# Patient Record
Sex: Female | Born: 1980
Health system: Southern US, Community
[De-identification: ages and names within clinical notes are randomized; demographics above are authoritative.]

## PROBLEM LIST (undated history)

## (undated) DIAGNOSIS — A6923 Arthritis due to Lyme disease: Secondary | ICD-10-CM

## (undated) DIAGNOSIS — I493 Ventricular premature depolarization: Secondary | ICD-10-CM

## (undated) DIAGNOSIS — M419 Scoliosis, unspecified: Secondary | ICD-10-CM

## (undated) DIAGNOSIS — M797 Fibromyalgia: Secondary | ICD-10-CM

## (undated) DIAGNOSIS — C801 Malignant (primary) neoplasm, unspecified: Secondary | ICD-10-CM

## (undated) DIAGNOSIS — M549 Dorsalgia, unspecified: Secondary | ICD-10-CM

## (undated) DIAGNOSIS — D649 Anemia, unspecified: Secondary | ICD-10-CM

## (undated) DIAGNOSIS — G510 Bell's palsy: Secondary | ICD-10-CM

## (undated) DIAGNOSIS — R569 Unspecified convulsions: Secondary | ICD-10-CM

## (undated) DIAGNOSIS — M479 Spondylosis, unspecified: Secondary | ICD-10-CM

## (undated) DIAGNOSIS — G43109 Migraine with aura, not intractable, without status migrainosus: Secondary | ICD-10-CM

## (undated) DIAGNOSIS — F419 Anxiety disorder, unspecified: Secondary | ICD-10-CM

## (undated) DIAGNOSIS — M609 Myositis, unspecified: Secondary | ICD-10-CM

## (undated) DIAGNOSIS — R002 Palpitations: Secondary | ICD-10-CM

## (undated) DIAGNOSIS — IMO0002 Reserved for concepts with insufficient information to code with codable children: Secondary | ICD-10-CM

## (undated) DIAGNOSIS — I10 Essential (primary) hypertension: Secondary | ICD-10-CM

## (undated) DIAGNOSIS — Z309 Encounter for contraceptive management, unspecified: Secondary | ICD-10-CM

## (undated) DIAGNOSIS — Z87442 Personal history of urinary calculi: Secondary | ICD-10-CM

## (undated) DIAGNOSIS — F17200 Nicotine dependence, unspecified, uncomplicated: Secondary | ICD-10-CM

## (undated) HISTORY — DX: Anemia, unspecified: D64.9

## (undated) HISTORY — DX: Anxiety disorder, unspecified: F41.9

## (undated) HISTORY — DX: Scoliosis, unspecified: M41.9

## (undated) HISTORY — DX: Migraine with aura, not intractable, without status migrainosus: G43.109

## (undated) HISTORY — DX: Nicotine dependence, unspecified, uncomplicated: F17.200

## (undated) HISTORY — DX: Encounter for contraceptive management, unspecified: Z30.9

## (undated) HISTORY — DX: Malignant (primary) neoplasm, unspecified: C80.1

---
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
## 2001-02-23 ENCOUNTER — Other Ambulatory Visit: Admission: RE | Admit: 2001-02-23 | Discharge: 2001-02-23 | Payer: Self-pay | Admitting: Obstetrics and Gynecology

## 2001-04-09 ENCOUNTER — Ambulatory Visit (HOSPITAL_COMMUNITY): Admission: EM | Admit: 2001-04-09 | Discharge: 2001-04-09 | Payer: Self-pay | Admitting: Obstetrics and Gynecology

## 2001-08-05 ENCOUNTER — Inpatient Hospital Stay (HOSPITAL_COMMUNITY): Admission: AD | Admit: 2001-08-05 | Discharge: 2001-08-06 | Payer: Self-pay | Admitting: Internal Medicine

## 2001-08-10 ENCOUNTER — Ambulatory Visit (HOSPITAL_COMMUNITY): Admission: AD | Admit: 2001-08-10 | Discharge: 2001-08-10 | Payer: Self-pay | Admitting: Internal Medicine

## 2001-08-20 ENCOUNTER — Ambulatory Visit (HOSPITAL_COMMUNITY): Admission: RE | Admit: 2001-08-20 | Discharge: 2001-08-20 | Payer: Self-pay | Admitting: Obstetrics and Gynecology

## 2001-08-30 ENCOUNTER — Observation Stay (HOSPITAL_COMMUNITY): Admission: AD | Admit: 2001-08-30 | Discharge: 2001-08-30 | Payer: Self-pay | Admitting: Obstetrics and Gynecology

## 2001-09-17 ENCOUNTER — Inpatient Hospital Stay (HOSPITAL_COMMUNITY): Admission: AD | Admit: 2001-09-17 | Discharge: 2001-09-19 | Payer: Self-pay | Admitting: Obstetrics and Gynecology

## 2001-11-29 ENCOUNTER — Emergency Department (HOSPITAL_COMMUNITY): Admission: EM | Admit: 2001-11-29 | Discharge: 2001-11-29 | Payer: Self-pay | Admitting: *Deleted

## 2003-12-14 ENCOUNTER — Ambulatory Visit (HOSPITAL_COMMUNITY): Admission: RE | Admit: 2003-12-14 | Discharge: 2003-12-14 | Payer: Self-pay | Admitting: Obstetrics and Gynecology

## 2004-05-28 ENCOUNTER — Ambulatory Visit (HOSPITAL_COMMUNITY): Admission: RE | Admit: 2004-05-28 | Discharge: 2004-05-28 | Payer: Self-pay | Admitting: Urology

## 2004-06-07 ENCOUNTER — Ambulatory Visit (HOSPITAL_COMMUNITY): Admission: RE | Admit: 2004-06-07 | Discharge: 2004-06-07 | Payer: Self-pay | Admitting: Obstetrics and Gynecology

## 2004-08-15 ENCOUNTER — Emergency Department (HOSPITAL_COMMUNITY): Admission: EM | Admit: 2004-08-15 | Discharge: 2004-08-16 | Payer: Self-pay | Admitting: *Deleted

## 2004-09-10 ENCOUNTER — Ambulatory Visit (HOSPITAL_COMMUNITY): Admission: RE | Admit: 2004-09-10 | Discharge: 2004-09-10 | Payer: Self-pay | Admitting: Obstetrics & Gynecology

## 2004-09-23 ENCOUNTER — Emergency Department (HOSPITAL_COMMUNITY): Admission: EM | Admit: 2004-09-23 | Discharge: 2004-09-23 | Payer: Self-pay | Admitting: Emergency Medicine

## 2004-10-04 ENCOUNTER — Encounter (HOSPITAL_COMMUNITY): Admission: RE | Admit: 2004-10-04 | Discharge: 2004-11-03 | Payer: Self-pay | Admitting: Orthopaedic Surgery

## 2004-11-18 HISTORY — PX: OOPHORECTOMY: SHX86

## 2004-12-03 ENCOUNTER — Ambulatory Visit (HOSPITAL_COMMUNITY): Admission: RE | Admit: 2004-12-03 | Discharge: 2004-12-03 | Payer: Self-pay | Admitting: Obstetrics and Gynecology

## 2005-01-05 ENCOUNTER — Emergency Department (HOSPITAL_COMMUNITY): Admission: EM | Admit: 2005-01-05 | Discharge: 2005-01-05 | Payer: Self-pay | Admitting: Emergency Medicine

## 2005-01-07 ENCOUNTER — Ambulatory Visit (HOSPITAL_COMMUNITY): Admission: RE | Admit: 2005-01-07 | Discharge: 2005-01-07 | Payer: Self-pay | Admitting: Family Medicine

## 2005-01-07 ENCOUNTER — Ambulatory Visit: Payer: Self-pay | Admitting: *Deleted

## 2005-01-14 ENCOUNTER — Ambulatory Visit (HOSPITAL_COMMUNITY): Admission: RE | Admit: 2005-01-14 | Discharge: 2005-01-14 | Payer: Self-pay | Admitting: Family Medicine

## 2005-01-24 ENCOUNTER — Ambulatory Visit (HOSPITAL_COMMUNITY): Admission: RE | Admit: 2005-01-24 | Discharge: 2005-01-24 | Payer: Self-pay | Admitting: Advanced Practice Midwife

## 2005-03-11 ENCOUNTER — Encounter (HOSPITAL_COMMUNITY): Admission: RE | Admit: 2005-03-11 | Discharge: 2005-04-10 | Payer: Self-pay | Admitting: Oncology

## 2005-03-11 ENCOUNTER — Encounter: Admission: RE | Admit: 2005-03-11 | Discharge: 2005-03-11 | Payer: Self-pay | Admitting: Oncology

## 2005-03-11 ENCOUNTER — Ambulatory Visit (HOSPITAL_COMMUNITY): Payer: Self-pay | Admitting: Oncology

## 2005-03-21 ENCOUNTER — Ambulatory Visit (HOSPITAL_COMMUNITY): Admission: RE | Admit: 2005-03-21 | Discharge: 2005-03-21 | Payer: Self-pay | Admitting: *Deleted

## 2005-04-15 ENCOUNTER — Ambulatory Visit (HOSPITAL_COMMUNITY): Admission: RE | Admit: 2005-04-15 | Discharge: 2005-04-15 | Payer: Self-pay | Admitting: Obstetrics and Gynecology

## 2005-05-30 ENCOUNTER — Emergency Department (HOSPITAL_COMMUNITY): Admission: EM | Admit: 2005-05-30 | Discharge: 2005-05-31 | Payer: Self-pay | Admitting: *Deleted

## 2005-10-25 ENCOUNTER — Emergency Department (HOSPITAL_COMMUNITY): Admission: EM | Admit: 2005-10-25 | Discharge: 2005-10-26 | Payer: Self-pay | Admitting: *Deleted

## 2005-10-28 ENCOUNTER — Ambulatory Visit (HOSPITAL_COMMUNITY): Admission: RE | Admit: 2005-10-28 | Discharge: 2005-10-28 | Payer: Self-pay | Admitting: Internal Medicine

## 2005-11-07 ENCOUNTER — Ambulatory Visit (HOSPITAL_COMMUNITY): Admission: RE | Admit: 2005-11-07 | Discharge: 2005-11-07 | Payer: Self-pay | Admitting: *Deleted

## 2005-11-29 ENCOUNTER — Other Ambulatory Visit: Admission: RE | Admit: 2005-11-29 | Discharge: 2005-11-29 | Payer: Self-pay | Admitting: Advanced Practice Midwife

## 2005-12-12 ENCOUNTER — Emergency Department (HOSPITAL_COMMUNITY): Admission: EM | Admit: 2005-12-12 | Discharge: 2005-12-12 | Payer: Self-pay | Admitting: Emergency Medicine

## 2006-02-20 ENCOUNTER — Ambulatory Visit: Payer: Self-pay | Admitting: Internal Medicine

## 2006-03-07 ENCOUNTER — Ambulatory Visit: Payer: Self-pay | Admitting: Internal Medicine

## 2006-03-07 ENCOUNTER — Ambulatory Visit (HOSPITAL_COMMUNITY): Admission: RE | Admit: 2006-03-07 | Discharge: 2006-03-07 | Payer: Self-pay | Admitting: Internal Medicine

## 2007-12-02 ENCOUNTER — Encounter: Payer: Self-pay | Admitting: Orthopedic Surgery

## 2007-12-02 ENCOUNTER — Ambulatory Visit (HOSPITAL_COMMUNITY): Admission: RE | Admit: 2007-12-02 | Discharge: 2007-12-02 | Payer: Self-pay | Admitting: Family Medicine

## 2008-03-18 ENCOUNTER — Other Ambulatory Visit: Admission: RE | Admit: 2008-03-18 | Discharge: 2008-03-18 | Payer: Self-pay | Admitting: Obstetrics and Gynecology

## 2008-03-18 ENCOUNTER — Encounter: Admission: RE | Admit: 2008-03-18 | Discharge: 2008-03-18 | Payer: Self-pay | Admitting: Family Medicine

## 2008-03-18 ENCOUNTER — Encounter: Payer: Self-pay | Admitting: Orthopedic Surgery

## 2008-03-21 ENCOUNTER — Ambulatory Visit (HOSPITAL_COMMUNITY): Admission: RE | Admit: 2008-03-21 | Discharge: 2008-03-21 | Payer: Self-pay | Admitting: Obstetrics & Gynecology

## 2008-06-15 ENCOUNTER — Ambulatory Visit (HOSPITAL_COMMUNITY): Admission: RE | Admit: 2008-06-15 | Discharge: 2008-06-15 | Payer: Self-pay | Admitting: Family Medicine

## 2008-07-27 ENCOUNTER — Emergency Department (HOSPITAL_COMMUNITY): Admission: EM | Admit: 2008-07-27 | Discharge: 2008-07-27 | Payer: Self-pay | Admitting: Emergency Medicine

## 2008-10-24 ENCOUNTER — Encounter: Payer: Self-pay | Admitting: Orthopedic Surgery

## 2008-10-24 ENCOUNTER — Ambulatory Visit (HOSPITAL_COMMUNITY): Admission: RE | Admit: 2008-10-24 | Discharge: 2008-10-24 | Payer: Self-pay | Admitting: Family Medicine

## 2008-10-27 ENCOUNTER — Ambulatory Visit: Payer: Self-pay | Admitting: Orthopedic Surgery

## 2008-10-27 DIAGNOSIS — M25519 Pain in unspecified shoulder: Secondary | ICD-10-CM | POA: Insufficient documentation

## 2008-10-27 DIAGNOSIS — M542 Cervicalgia: Secondary | ICD-10-CM | POA: Insufficient documentation

## 2008-10-28 ENCOUNTER — Encounter: Payer: Self-pay | Admitting: Orthopedic Surgery

## 2008-10-28 ENCOUNTER — Telehealth: Payer: Self-pay | Admitting: Orthopedic Surgery

## 2008-11-04 ENCOUNTER — Telehealth: Payer: Self-pay | Admitting: Orthopedic Surgery

## 2008-11-22 ENCOUNTER — Telehealth: Payer: Self-pay | Admitting: Orthopedic Surgery

## 2008-12-21 ENCOUNTER — Ambulatory Visit (HOSPITAL_COMMUNITY): Admission: RE | Admit: 2008-12-21 | Discharge: 2008-12-21 | Payer: Self-pay | Admitting: Family Medicine

## 2009-01-25 ENCOUNTER — Ambulatory Visit (HOSPITAL_COMMUNITY): Admission: RE | Admit: 2009-01-25 | Discharge: 2009-01-25 | Payer: Self-pay | Admitting: Neurological Surgery

## 2009-03-31 ENCOUNTER — Other Ambulatory Visit: Admission: RE | Admit: 2009-03-31 | Discharge: 2009-03-31 | Payer: Self-pay | Admitting: Obstetrics and Gynecology

## 2009-05-12 ENCOUNTER — Encounter: Admission: RE | Admit: 2009-05-12 | Discharge: 2009-05-12 | Payer: Self-pay | Admitting: Family Medicine

## 2009-11-01 ENCOUNTER — Emergency Department (HOSPITAL_COMMUNITY): Admission: EM | Admit: 2009-11-01 | Discharge: 2009-11-01 | Payer: Self-pay | Admitting: Emergency Medicine

## 2010-02-09 ENCOUNTER — Ambulatory Visit (HOSPITAL_COMMUNITY): Admission: RE | Admit: 2010-02-09 | Discharge: 2010-02-09 | Payer: Self-pay | Admitting: Family Medicine

## 2010-02-15 ENCOUNTER — Emergency Department (HOSPITAL_COMMUNITY): Admission: EM | Admit: 2010-02-15 | Discharge: 2010-02-15 | Payer: Self-pay | Admitting: Emergency Medicine

## 2010-05-20 ENCOUNTER — Ambulatory Visit: Payer: Self-pay | Admitting: Family Medicine

## 2010-05-20 DIAGNOSIS — F17200 Nicotine dependence, unspecified, uncomplicated: Secondary | ICD-10-CM | POA: Insufficient documentation

## 2010-05-20 DIAGNOSIS — J029 Acute pharyngitis, unspecified: Secondary | ICD-10-CM

## 2010-05-20 DIAGNOSIS — T169XXA Foreign body in ear, unspecified ear, initial encounter: Secondary | ICD-10-CM | POA: Insufficient documentation

## 2010-05-20 DIAGNOSIS — J209 Acute bronchitis, unspecified: Secondary | ICD-10-CM

## 2010-05-20 DIAGNOSIS — J449 Chronic obstructive pulmonary disease, unspecified: Secondary | ICD-10-CM | POA: Insufficient documentation

## 2010-05-20 LAB — CONVERTED CEMR LAB: Rapid Strep: NEGATIVE

## 2010-06-01 ENCOUNTER — Other Ambulatory Visit
Admission: RE | Admit: 2010-06-01 | Discharge: 2010-06-01 | Payer: Self-pay | Source: Home / Self Care | Admitting: Obstetrics & Gynecology

## 2010-08-09 ENCOUNTER — Ambulatory Visit (HOSPITAL_COMMUNITY): Admission: RE | Admit: 2010-08-09 | Discharge: 2010-08-09 | Payer: Self-pay | Admitting: Family Medicine

## 2010-08-10 ENCOUNTER — Ambulatory Visit: Admission: AD | Admit: 2010-08-10 | Discharge: 2010-08-10 | Payer: Self-pay | Admitting: Family Medicine

## 2010-08-12 ENCOUNTER — Emergency Department (HOSPITAL_COMMUNITY): Admission: EM | Admit: 2010-08-12 | Discharge: 2010-08-12 | Payer: Self-pay | Admitting: Emergency Medicine

## 2010-08-15 ENCOUNTER — Ambulatory Visit (HOSPITAL_COMMUNITY)
Admission: RE | Admit: 2010-08-15 | Discharge: 2010-08-15 | Payer: Self-pay | Source: Home / Self Care | Admitting: Physical Medicine and Rehabilitation

## 2010-08-22 ENCOUNTER — Emergency Department (HOSPITAL_COMMUNITY): Admission: EM | Admit: 2010-08-22 | Discharge: 2010-08-23 | Payer: Self-pay | Admitting: Emergency Medicine

## 2010-08-30 ENCOUNTER — Ambulatory Visit: Payer: Self-pay | Admitting: Otolaryngology

## 2010-12-09 ENCOUNTER — Encounter: Payer: Self-pay | Admitting: Family Medicine

## 2010-12-10 ENCOUNTER — Encounter: Payer: Self-pay | Admitting: Family Medicine

## 2010-12-20 NOTE — Letter (Signed)
Summary: Handout Printed  Printed Handout:  - Smoking Cessation

## 2010-12-20 NOTE — Assessment & Plan Note (Signed)
Summary: CHEST COLD/EVM   Vital Signs:  Patient Profile:   30 Years Old Female CC:      Uri symptoms x 2 weeks Height:     64 inches Weight:      129 pounds BMI:     22.22 BSA:     1.62 O2 Sat:      99 % O2 treatment:    Room Air Temp:     98.9 degrees F oral Pulse rate:   68 / minute Pulse rhythm:   regular Resp:     20 per minute BP sitting:   120 / 80  (left arm) Cuff size:   regular  Vitals Entered By: Providence Crosby LPN (May 20, 5620 3:58 PM)                  Current Allergies: No known allergies History of Present Illness History from: patient Reason for visit: see chief complaint Chief Complaint: cough and sputum production  x 2 weeks History of Present Illness: symptoms x 2 weeks sputum brown in color ;the patient reports that she smokes approximately one pack of cigarettes per day. She reports that she's been wheezing at night. She reports that she's not able to sleep well. She reports that she has been smoking for over 15 years. She reports that she has been trying to quit but has difficulty. She says that she needs help. She reports that she uses her inhaler more frequently in the past 2 days. She reports waking up with fever and chills. She reports coughing up thick sputum. The patient denies blood in the sputum. The patient denies chest pain. The patient denies headaches.the patient reports that she has been using Robitussin but no significant improvement. She's been using the over-the-counter cough syrup.  REVIEW OF SYSTEMS Constitutional Symptoms       Complains of night sweats and fatigue.     Denies fever, chills, weight loss, and weight gain.      Comments: Exhausted feeling Eyes       Denies change in vision, eye pain, eye discharge, glasses, contact lenses, and eye surgery. Ear/Nose/Throat/Mouth       Complains of frequent runny nose, sinus problems, sore throat, and hoarseness.      Denies hearing loss/aids, change in hearing, ear pain, ear discharge,  dizziness, frequent nose bleeds, and tooth pain or bleeding.  Respiratory       Complains of dry cough, productive cough, shortness of breath, bronchitis, and emphysema/COPD.      Denies wheezing and asthma.  Cardiovascular       Complains of murmurs.      Denies chest pain and tires easily with exhertion.      Comments: tachycardia/von-willebrants  //    Gastrointestinal       Denies stomach pain, nausea/vomiting, diarrhea, constipation, blood in bowel movements, and indigestion. Genitourniary       Denies painful urination, blood or discharge from vagina, kidney stones, and loss of urinary control. Neurological       Denies paralysis, seizures, and fainting/blackouts. Musculoskeletal       Complains of joint stiffness, redness, and swelling.      Denies muscle pain, joint pain, decreased range of motion, muscle weakness, and gout.  Skin       Denies bruising, unusual mles/lumps or sores, and hair/skin or nail changes.  Psych       Denies mood changes, temper/anger issues, anxiety/stress, speech problems, depression, and sleep problems. Other Comments: complains  of some swelling of left side of neck   Past History:  Family History: Last updated: 05/20/2010 Family History of Arthritis Father: deceased= Heart Failure dialysis Mother: alive heart murmur copd Siblings: 2 brothers alive and well  Social History: Last updated: 10/27/2008 Patient is married.  medical assistant  Risk Factors: Caffeine Use: 4 (10/27/2008)  Risk Factors: Smoking Status: current (05/20/2010) Packs/Day: 1.0 (05/20/2010)  Past Medical History: COPD Tachcardia DDD C2-C7 Von - Willebrands Disease Smoker  Past Surgical History: Cystectomy of the left ovary  Family History: Family History of Arthritis Father: deceased= Heart Failure dialysis Mother: alive heart murmur copd Siblings: 2 brothers alive and well  Social History: Packs/Day:  1.0 Physical Exam General appearance: well  developed, well nourished, no acute distress Head: normocephalic, atraumatic Eyes: conjunctivae and lids normal Pupils: equal, round, reactive to light Ears: small black foreign body in the right ear canal Nasal: marked sinus and nasal congestion Oral/Pharynx: tongue normal, posterior pharynx with mild erythema and mildly enlarged tonsils Neck: neck supple,  trachea midline, no masses Thyroid: soft, no nodules or masses palpated Chest/Lungs: inspiratory and expiratory wheezes heard in both lung bases, scattered Rales-rare Heart: normal S1, S2 sounds Abdomen: soft, non-tender without obvious organomegaly Extremities: normal extremities Neurological: grossly intact and non-focal Skin: no obvious rashes or lesions MSE: oriented to time, place, and person post nebulizer treatment: The patient was reexamined and her lungs were much more clear and no wheezing was heard.    Assessment Problems:   CHRONIC OBSTRUCTIVE PULMONARY DISEASE, ACUTE EXACERBATION (ICD-491.21) FOREIGN BODY, EAR, RIGHT (ICD-931) CHRONIC OBSTRUCTIVE PULMONARY DISEASE (ICD-496) ACUTE BRONCHITIS (ICD-466.0) SORE THROAT (ICD-462) SMOKER (ICD-305.1) COUGH (ICD-786.2) UPPER RESPIRATORY INFECTION, ACUTE (ICD-465.9) NECK PAIN (ICD-723.1) SHOULDER PAIN (ICD-719.41) FAMILY HISTORY OF ARTHRITIS (ICD-V17.7) New Problems: CHRONIC OBSTRUCTIVE PULMONARY DISEASE, ACUTE EXACERBATION (ICD-491.21) FOREIGN BODY, EAR, RIGHT (ICD-931) CHRONIC OBSTRUCTIVE PULMONARY DISEASE (ICD-496) ACUTE BRONCHITIS (ICD-466.0) SORE THROAT (ICD-462) SMOKER (ICD-305.1) COUGH (ICD-786.2) UPPER RESPIRATORY INFECTION, ACUTE (ICD-465.9)   Patient Education: Patient and/or caregiver instructed in the following: rest, fluids, quit smoking. Demonstrates willingness to comply.  Plan New Medications/Changes: GUAIFENESIN-CODEINE 100-10 MG/5ML SYRP (GUAIFENESIN-CODEINE) take 10 mL by mouth q4-6h as needed cough, take with plenty of water  #120 mL x 0,  05/20/2010, Keylen Eckenrode MD VENTOLIN HFA 108 (90 BASE) MCG/ACT AERS (ALBUTEROL SULFATE) 2 puffs q 3 hours as needed severe cough and wheezing  #1 x 0, 05/20/2010, Kalei Meda MD ZITHROMAX Z-PAK 250 MG TABS (AZITHROMYCIN) use as directed  #1 x 1, 05/20/2010, Tarina Volk MD GUAIFENESIN-CODEINE 100-10 MG/5ML SYRP (GUAIFENESIN-CODEINE) take 10 mL by mouth q4-6h as needed cough, take with plenty of water  #120 mL x 0, 05/20/2010, Wendy Hoback MD GUAIFENESIN-CODEINE 100-10 MG/5ML SYRP (GUAIFENESIN-CODEINE) take 10 mL by mouth q4-6h as needed cough, take with plenty of water  #150 mL x 0, 05/20/2010, Providence Crosby LPN  New Orders: Pulse Oximetry (single measurment) [94760] Rapid Strep-[facility] [87430] Nebulizer Tx [94640] Clear Outer Ear canal [69200] New Patient Level III [54098] Tobacco use cessation intensive >10 minutes [99407] (J1914) Tobacco (smoke) use Cessation Intervention-Counseling [G8402] Follow Up: Follow up in 1-2 days if no improvement, Follow up with Primary Physician  The patient and/or caregiver has been counseled thoroughly with regard to medications prescribed including dosage, schedule, interactions, rationale for use, and possible side effects and they verbalize understanding.  Diagnoses and expected course of recovery discussed and will return if not improved as expected or if the condition worsens. Patient and/or caregiver verbalized understanding.  Prescriptions: GUAIFENESIN-CODEINE 100-10 MG/5ML  SYRP (GUAIFENESIN-CODEINE) take 10 mL by mouth q4-6h as needed cough, take with plenty of water  #120 mL x 0   Entered and Authorized by:   Standley Dakins MD   Signed by:   Standley Dakins MD on 05/20/2010   Method used:   Print then Give to Patient   RxID:   [id number] VENTOLIN HFA 108 (90 BASE) MCG/ACT AERS (ALBUTEROL SULFATE) 2 puffs q 3 hours as needed severe cough and wheezing  #1 x 0   Entered and Authorized by:   Standley Dakins MD   Signed by:    Standley Dakins MD on 05/20/2010   Method used:   Electronically to        [facility] [address] (retail)       [address], [address]       [place], [place]  [address]       Ph: [phone]       Fax: [phone]   RxID:   [id number] ZITHROMAX Z-PAK 250 MG TABS (AZITHROMYCIN) use as directed  #1 x 1   Entered and Authorized by:   Standley Dakins MD   Signed by:   Standley Dakins MD on 05/20/2010   Method used:   Electronically to        [facility] [address] (retail)       [address], [address]       [place], [place]  [address]       Ph: [phone]       Fax: [phone]   RxID:   [id number] GUAIFENESIN-CODEINE 100-10 MG/5ML SYRP (GUAIFENESIN-CODEINE) take 10 mL by mouth q4-6h as needed cough, take with plenty of water  #120 mL x 0   Entered and Authorized by:   Standley Dakins MD   Signed by:   Standley Dakins MD on 05/20/2010   Method used:   Print then Give to Patient   RxID:   [id number] GUAIFENESIN-CODEINE 100-10 MG/5ML SYRP (GUAIFENESIN-CODEINE) take 10 mL by mouth q4-6h as needed cough, take with plenty of water  #150 mL x 0   Entered and Authorized by:   Standley Dakins MD   Signed by:   Providence Crosby LPN on 37/08/6268   Method used:   Print then Give to Patient   RxID:   [id number]  Only one prescription for the cough syrup was given. We had difficulty with printing that prescription on the appropriate paper required. The patient actually only received one prescription for the cough syrup.  DuoNeb was the medication use for the nebulizer treatment. The patient tolerated very well.  Patient Instructions: 1)  Please schedule an appointment with your primary doctor in : 2-3 days 2)  Please take antibiotics until completed 3)  Please seek care if not getting better or getting worse. 4)  There is no on-call provider or services available at this clinic during off-hours (when the  clinic is closed).  If you developed a problem or concern that required immediate attention, the patient was advised to go the the nearest available urgent care or emergency department for medical care.   5)  Tobacco is very bad for your health and your loved ones! You Should stop smoking!. 6)  Stop Smoking Tips: Choose a Quit date. Cut down before the Quit date. decide what you will do as a substitute when you feel the urge to smoke(gum,toothpick,exercise). 7)  Eat yogurt to prevent yeast infections.   Orders Added: 1)  Pulse Oximetry (single measurment) [94760] 2)  Rapid Strep-[facility] [87430] 3)  Nebulizer Tx [04540] 4)  Clear Outer Ear canal [69200] 5)  New Patient Level III [98119] 6)  Tobacco use cessation intensive >10 minutes [99407] 7)  (J4782) Tobacco (smoke) use Cessation Intervention-Counseling [G8402]  Laboratory Results  Date/Time Received: May 20, 2010 4:12 PM Date/Time Reported: May 20, 2010 4:12 PM  Other Tests  Rapid Strep: negative   Preventive Screening-Counseling & Management  Alcohol-Tobacco     Smoking Status: current     Smoking Cessation Counseling: yes     Smoke Cessation Stage: ready     Packs/Day: 1.0     Year Started: 1995     Tobacco Counseling: to quit use of tobacco products     Passive Smoke Counseling: to avoid passive smoke exposure  Comments: I spent more than 10 minutes reviewing smoking cessation counseling techniques and encouraged the patient to quit smoking.  The risks, benefits and possible side effects were clearly explained and discussed with the patient.  The patient verbalized clear understanding.  The patient was given instructions to return if symptoms don't improve, worsen or new changes develop.  If it is not during clinic hours and the patient cannot get back to this clinic then the patient was told to seek medical care at an available urgent care or emergency department.  The patient verbalized understanding.     The patient was  informed that there is no on-call provider or services available at this clinic during off-hours (when the clinic is closed).  If the patient developed a problem or concern that required immediate attention, the patient was advised to go the the nearest available urgent care or emergency department for medical care.  The patient verbalized understanding.     It was clearly explained to the patient that this [facility] is not intended to be a primary care clinic.  The patient is always better served by the continuity of care and the provider/patient relationships developed with their dedicated primary care provider.  The patient was told to be sure to follow up as soon as possible with their primary care provider to discuss treatments received and to receive further examination and testing.  The patient verbalized understanding. The will f/u with PCP ASAP.   The patient's prescriptions were checked for possible interactions and electronically sent to the pharmacy of choice.    Rodney Langton, M.D., F.A.A.F.P.  May 20, 2010

## 2011-01-30 LAB — CBC
MCH: 31.6 pg (ref 26.0–34.0)
MCHC: 35.3 g/dL (ref 30.0–36.0)
MCV: 89.4 fL (ref 78.0–100.0)
Platelets: 358 10*3/uL (ref 150–400)

## 2011-01-30 LAB — DIFFERENTIAL
Basophils Absolute: 0.3 10*3/uL — ABNORMAL HIGH (ref 0.0–0.1)
Eosinophils Relative: 2 % (ref 0–5)
Lymphocytes Relative: 26 % (ref 12–46)
Lymphs Abs: 4.3 10*3/uL — ABNORMAL HIGH (ref 0.7–4.0)
Monocytes Absolute: 0.9 10*3/uL (ref 0.1–1.0)
Neutro Abs: 10.8 10*3/uL — ABNORMAL HIGH (ref 1.7–7.7)

## 2011-01-30 LAB — URINALYSIS, ROUTINE W REFLEX MICROSCOPIC
Bilirubin Urine: NEGATIVE
Ketones, ur: NEGATIVE mg/dL
Nitrite: NEGATIVE
Protein, ur: NEGATIVE mg/dL
Specific Gravity, Urine: 1.025 (ref 1.005–1.030)
Urobilinogen, UA: 0.2 mg/dL (ref 0.0–1.0)

## 2011-01-30 LAB — COMPREHENSIVE METABOLIC PANEL
AST: 13 U/L (ref 0–37)
Albumin: 3.4 g/dL — ABNORMAL LOW (ref 3.5–5.2)
BUN: 12 mg/dL (ref 6–23)
Calcium: 8.9 mg/dL (ref 8.4–10.5)
Creatinine, Ser: 0.72 mg/dL (ref 0.4–1.2)
GFR calc Af Amer: >60 mL/min (ref >60)
GFR calc non Af Amer: >60 mL/min (ref >60)

## 2011-01-30 LAB — PREGNANCY, URINE: Preg Test, Ur: NEGATIVE

## 2011-04-03 ENCOUNTER — Other Ambulatory Visit (HOSPITAL_COMMUNITY): Payer: Self-pay | Admitting: Family Medicine

## 2011-04-03 DIAGNOSIS — M546 Pain in thoracic spine: Secondary | ICD-10-CM

## 2011-04-04 ENCOUNTER — Ambulatory Visit (HOSPITAL_COMMUNITY)
Admission: RE | Admit: 2011-04-04 | Discharge: 2011-04-04 | Disposition: A | Discharge status: Home / Self Care | Payer: PRIVATE HEALTH INSURANCE | Source: Ambulatory Visit | Attending: Family Medicine | Admitting: Family Medicine

## 2011-04-04 DIAGNOSIS — M5124 Other intervertebral disc displacement, thoracic region: Secondary | ICD-10-CM | POA: Insufficient documentation

## 2011-04-04 DIAGNOSIS — R209 Unspecified disturbances of skin sensation: Secondary | ICD-10-CM | POA: Insufficient documentation

## 2011-04-04 DIAGNOSIS — M546 Pain in thoracic spine: Secondary | ICD-10-CM | POA: Insufficient documentation

## 2011-04-05 NOTE — Op Note (Signed)
NAME:  Theresa Washington, Theresa Washington                     ACCOUNT NO.:  [id number]   MEDICAL RECORD NO.:  [id number]                   PATIENT TYPE:  AMB   LOCATION:  DAY                                  FACILITY:  [facility]   PHYSICIAN:  Tilda Burrow, M.D.              DATE OF BIRTH:  [birth year]   DATE OF PROCEDURE:  DATE OF DISCHARGE:                                 OPERATIVE REPORT   PREOPERATIVE DIAGNOSIS:  Symptomatic left ovarian cyst.   POSTOPERATIVE DIAGNOSIS:  Symptomatic left ovarian cyst.   OPERATION/PROCEDURE:  1. Laparoscopic aspiration of left ovarian cyst.  2. Left partial cystectomy.   SURGEON:  Tilda Burrow, M.D.   ASSISTANTGeralynn Ochs, C.S.T.-F.A.   ANESTHESIA:  General.   COMPLICATIONS:  None.   FINDINGS:  Normal mobile uterus, normal-appearing tubes and ovaries  bilaterally.  Large cyst on the distal portion of the left ovary, with  smooth surfaces.  No evidence of recent ovulation stigmata.   INDICATIONS:  A 30 year old female being followed for pain cyst for the last  two months.  See HPI for further details.   HOSPITAL COURSE:  The patient was taken to the operating room, prepped and  draped for combined abdominal and vaginal procedure with single-tooth  tenaculum used to grasp the cervix.  The IUD was not manipulated.  We first  started with an infraumbilical vertical incision in the umbilicus and made a  transverse suprapubic 1 cm incision.  A Veress needle was introduced and  water droplet technique suggests the intraperitoneal location.  We started  insufflating when the pressures were 12 mm so we felt that was too high and  pushed forward just slightly and revealed that we had done some  preperitoneal insufflation.  Insufflation then continued at 9-10 mm pressure  without difficulty.  The umbilical site was entered with a 5 mm trocar using  laparoscopic direct guidance and then the suprapubic site treated similarly  with direct visualization of the  insertion tip.  The right lower quadrant  was visualized from the abdomen and entered without difficulty.   Attention was directed to the left adnexa where the cyst could be elevated  out of the cul-de-sac.  There was no stigmata of ovulation and no adhesions.  We made puncture cautery opening of the cyst and drained the cyst fluid.  We  then grasped the edge of the puncture site, and opened the tip of the ovary  a distance of approximately 1.5 cm and began to peel out the ovarian cyst.  Unfortunately, the degree of oozing was enough to cause concern and in view  of her possible von Willebrand history, we reassessed and decided to simply  do partial cystectomy and leave the remainder of the ovary and cyst.  There  was no evidence of endometriosis in the cul-de-sac.  Irrigation of the  pelvis was performed and hemostasis confirmed.  The procedure I considered  complete with removal  of probably a small portion of the cyst wall for  histology.  The patient went to the recovery room in good condition.  Sponge  and needle counts were correct.   Photos were taken and will be included in the patient's outpatient record.      ___________________________________________                                            Tilda Burrow, M.D.   JVF/MEDQ  D:  06/07/2004  T:  06/07/2004  Job:  [id number]   cc:   [facility]

## 2011-04-05 NOTE — Op Note (Signed)
[facility]  Patient:    MARLITA, KEIL Visit Number: [id number] MRN: [id number]          Service Type: MED Location: 4A A428 01 Attending Physician:  Tilda Burrow Dictated by:   Estelle June, C.N.M. Admit Date:  09/17/2001 Discharge Date: 09/19/2001   CC:         [facility]  J. Joneen Caraway, M.D.   Operative Report  DELIVERY NOTE:  The patient had a strong urge to push with a reducible anterior lip of the cervix at approximately 0920.  After a brief second stage, she delivered a viable female infant at 7.  The mouth and nose were suctioned on the perineum and the rest of the baby was delivered without difficulty. Positive bonding noted.  Weight of the baby is 6 pounds 8 ounces, Apgars 9/9. The placenta separated spontaneously and delivered by a controlled cord traction and maternal pushing effort at 0946.  Twenty units of Pitocin diluted in 1000 cc of lactated Ringers were then infused rapidly IV.  The fundus was immediately firm and minimal blood loss was noted.  The vagina was then inspected and no lacerations were found.  Estimated blood loss:  350 cc. Dictated by:   Estelle June, C.N.M. Attending Physician:  Tilda Burrow DD:  09/18/01 TD:  09/20/01 Job: [id number]

## 2011-04-05 NOTE — Op Note (Signed)
Theresa Washington, Theresa Washington           ACCOUNT NO.:  [id number]   MEDICAL RECORD NO.:  [id number]          PATIENT TYPE:  OIB   LOCATION:  2853                         FACILITY:  [facility]   PHYSICIAN:  Doylene Canning. Ladona Ridgel, M.D.  DATE OF BIRTH:  May 18, 1981   DATE OF PROCEDURE:  03/07/2006  DATE OF DISCHARGE:  03/07/2006                                 OPERATIVE REPORT   PROCEDURE PERFORMED:  Head-up tilt table testing utilizing isoproterenol.   ATTENDING:  Doylene Canning. Ladona Ridgel, M.D.   I. 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  The patient  has had recurrent episodes of syncope of unclear etiology.  She is now  referred for head-up tilt table testing.   II. PROCEDURE:  After informed consent was obtained, the patient was taken  to the diagnostic EP lab in the fasting state.  After the usual preparation  and draping, she was placed in the supine position.  Her initial blood  pressure was 130/70 and her pulse was 66.  After approximately 5 minutes,  she was placed in a 70-degree head-up tilt table position.  Her blood  pressure went to the 130 range and stayed there.  Her heart rate increased  from the 70s to the 80 range.  She was maintained in this position for 30  minutes.  During this time, her blood pressure remained in the 130 range and  her heart rate got up as high as 95 beats per minute.  There was no  bradycardia.  She was asymptomatic.  She was placed back in the supine  position.  Isoproterenol was started at 1 mcg/min and her heart rate  increased to 110 beats per minute.  She initially felt somewhat dizzy.  She  was placed back in a 70-degree head-up tilt table position and during this  time she was observed and her blood pressure remained in the 120-130 range.  The blood pressure did not change significantly.   After 10 minutes in this  position, she was placed back in a supine position and returned to her room  in satisfactory condition.   COMPLICATIONS:  There were no immediate procedure complications.   RESULTS:  This demonstrates a negative head-up tilt table test for inducible  syncope.  The patient tolerated the procedure well.  She will be observed  expectantly.           ______________________________  Doylene Canning. Ladona Ridgel, M.D.     GWT/MEDQ  D:  03/07/2006  T:  03/10/2006  Job:  [id number]   cc:   Dani Gobble, MD  Fax: [phone]   Corrie Mckusick, M.D.  Fax: [phone]

## 2011-04-05 NOTE — Procedures (Signed)
Theresa, Washington           ACCOUNT NO.:  [id number]   MEDICAL RECORD NO.:  [id number]          PATIENT TYPE:  OUT   LOCATION:  RAD                           FACILITY:  [facility]   PHYSICIAN:  Vida Roller, M.D.   DATE OF BIRTH:  1981-10-23   DATE OF PROCEDURE:  01/07/2005  DATE OF DISCHARGE:                                  ECHOCARDIOGRAM   PRIMARY CARE PHYSICIAN:  Corrie Mckusick, M.D.   TAPE NUMBER:  [id number].   TAPE COUNT:  1858 through 2426.   HISTORY:  A 30 year old woman with a murmur.  The technical quality of this  study is adequate.   M-MODE TRACINGS:  The aorta is 23 mm.   The left atrium is 27 mm.   The septum is 8 mm.   The posterior wall is 8 mm.   The left ventricular diastolic dimension is 38 mm.   The left ventricular systolic dimension is 29 mm.   2-D AND DOPPLER IMAGING:  The left ventricle is normal size with normal  systolic function.  There are no wall motion abnormalities seen.  The  estimated ejection fraction is 55 to 60%.   The right ventricle is normal size with normal systolic function.  There is  a prominent moderator band.   The atria are both normal size.  The atrial septum is intact to color.   The aortic valve is morphologically unremarkable with no stenosis or  regurgitation.   The mitral valve is morphologically unremarkable with no stenosis or  regurgitation.   The tricuspid valve is morphologically unremarkable with trace  insufficiency, no stenosis is seen.   The pulmonic valve is morphologically unremarkable with no stenosis or  regurgitation.   There is no pericardial effusion.   The inferior vena cava is normal size.   The ascending aorta is normal to the limits of the study.      JH/MEDQ  D:  01/07/2005  T:  01/07/2005  Job:  [id number]

## 2011-04-05 NOTE — H&P (Signed)
[facility]  Patient:    ANNISTYN, DEPASS Visit Number: [id number] MRN: [id number]          Service Type: MED Location: 4A A428 01 Attending Physician:  Tilda Burrow Dictated by:   Pat Kocher, CNM Admit Date:  09/17/2001 Discharge Date: 09/19/2001   CC:         [facility]   History and Physical  CHIEF COMPLAINT:  Induction of labor at [redacted] weeks gestation.  HISTORY OF PRESENT ILLNESS:  [place]ah is a 30 year old, gravida 2, para 1 with an EDC of September 24, 2001 based on a sure last menstrual period and first and second trimester ultrasound. Her pregnancy course has been complicated by some early preterm contractions without cervical change. She did receive a course of betamethasone prophylactically. Blood pressures have been 110 to 130s over 60-70s. Weight gain has been approximately 20 pounds with appropriate fundal height growth.  PRENATAL LABS:  Blood type O+, rubella immune. HBsAg, HIV, GC, chlamydia are negative. MSAFP is normal. One hour GTT was normal at 116. GBS is negative.  PHYSICAL EXAMINATION:  HEENT:  Within normal limits.  HEART:  Regular rate and rhythm without murmurs.  LUNGS:  Clear to auscultation bilaterally.  ABDOMEN:  Soft and nontender.  PELVIC:  Cervix is 2, 50%, -1 vertex presentation,  EXTREMITIES:  Legs are negative.  IMPRESSION:  IUP at 30 weeks with elective induction of labor due to cervical favorability.  PLAN:  Foley preinduction with AROM and Pitocin in the morning. Dictated by:   Pat Kocher, CNM Attending Physician:  Tilda Burrow DD:  09/16/01 TD:  09/16/01 Job: [id number]

## 2011-04-05 NOTE — H&P (Signed)
NAME:  Theresa Washington, Theresa Washington                     ACCOUNT NO.:  [id number]   MEDICAL RECORD NO.:  [id number]                   PATIENT TYPE:  AMB   LOCATION:  DAY                                  FACILITY:  [facility]   PHYSICIAN:  Tilda Burrow, M.D.              DATE OF BIRTH:  04-30-1981   DATE OF ADMISSION:  DATE OF DISCHARGE:                                HISTORY & PHYSICAL   ADMITTING DIAGNOSES:  Symptomatic recurrent left ovarian cyst.   HISTORY OF PRESENT ILLNESS:  This 30 year old female, gravida 2, para 2,  using progesterone containing IUD for two years for contraception, thus  amenorrheic is admitted at this time for recurrent left ovarian cyst which  had been incapacitating, preventing the patient ability to continue work  obligations and normal personal activities.  She has been followed through  our office with a persistently painful left ovarian cyst.  She was seen in  early June by Dr. Despina Hidden where vaginal probe ultrasound revealed a 3.7 x4.2  x4.3 cm simple left ovarian cyst without free fluid, no septations, with a  completely normal right ovary.  She was placed on Ovcon-35 for suppression  and followed up in our office.  Interestingly, the pain improved initially,  and she was seen back for recurrent left flank pain enough that she was seen  in the emergency room on May 28, 2004, where a CT of the abdomen was  performed to rule out kidney stone.  There was no evidence of renal or  ureteral colic or calculi.  The IUD was in place.  There was a persistent  left ovarian cyst which was reassessed by ultrasound which showed that the  left adnexa had improved with there now being two cysts.  The maximum  diameter of the larger being 2.3 x2.0 cm in diameter.  It was felt that she  was probably collapsing one cyst and developing another.  She did have  generous cul-de-sac fluid suggesting that ovulation rupture may have  occurred.  This improved transiently, but then worsened.   Repeat ultrasound  June 01, 2004, showed that indeed the collapsing cyst still exists but  adjacent to it was an enlarging simple cyst, 3.9 x 3.0 cm in diameter with a  single septation between the two cysts.  This has been sufficiently painful  that it has been difficult for the patient to walk or have normal day to day  work activities.  After some discussion of her options and continued pain  requiring continuous analgesic use, we have decided that aspiration and  drainage of the cyst or cystectomy is required.  The patient is prepared for  the possibility of cystectomy or more extensive surgery up to and including  salpingectomy, oophorectomy or both if deemed clinically necessary at the  time of surgery.   PAST MEDICAL HISTORY:  Benign except for an incomplete notation suggesting  von Willebrand's disease with no recent coagulation studies.  Two  pregnancies have been uncomplicated, but until use of the IUD, the patient  had chronic, persistent menstrual irregularities and bleeding abnormalities  with virtually every contraceptive method.   SURGICAL HISTORY:  1. Grommets placed in ears in 1985.  2. D&C 1999 without difficulties.  3. Two uncomplicated vaginal deliveries with normal blood loss at delivery.     One episode of dysfunctional bleeding after the second pregnancy not     requiring any clinical intervention.  Amenorrhea on current contraceptive     method.   ALLERGIES:  None known.   PHYSICAL EXAMINATION:  VITAL SIGNS:  Height 5 feet, 2 inches.  Weight 110.  Blood pressure 110/62.  GENERAL:  Exam shows a healthy-appearing Caucasian female, alert and  oriented x3.  Pupils equal, round and reactive.  Extraocular movements  intact.  NECK: Supple.  Trachea midline.  CHEST:  Clear to auscultation.  ABDOMEN:  Slim with naval ring in place.  No masses. Tenderness in left  lower quadrant with vaginal probe ultrasound showing an enlarging dominant  cyst, 3.4 x3.1 cm in  diameter adjacent to a collapsing cyst approximately  2.5 cm in maximum diameter.  Possibility of hydrosalpinx has been  entertained, but is considered less likely.   IMPRESSION:  Recurrent symptomatic cyst, left ovary.   PLAN:  Laparoscopic evaluation of adnexa with preferred therapy consisting  of ovarian cystectomy or simple aspiration of cyst.  We will consider  oophorectomy or salpingo-oophorectomy as clinically indicated based on the  findings at the time of surgery.   ADDENDUM:  We will obtain coagulation studies on the day of surgery.     ___________________________________________                                         Tilda Burrow, M.D.   JVF/MEDQ  D:  06/07/2004  T:  06/07/2004  Job:  [id number]

## 2011-04-24 ENCOUNTER — Other Ambulatory Visit (HOSPITAL_COMMUNITY): Payer: Self-pay | Admitting: Internal Medicine

## 2011-04-24 DIAGNOSIS — M545 Low back pain: Secondary | ICD-10-CM

## 2011-04-26 ENCOUNTER — Ambulatory Visit (HOSPITAL_COMMUNITY): Payer: PRIVATE HEALTH INSURANCE

## 2011-04-29 ENCOUNTER — Ambulatory Visit (HOSPITAL_COMMUNITY)
Admission: RE | Admit: 2011-04-29 | Discharge: 2011-04-29 | Disposition: A | Discharge status: Home / Self Care | Payer: PRIVATE HEALTH INSURANCE | Source: Ambulatory Visit | Attending: Internal Medicine | Admitting: Internal Medicine

## 2011-04-29 DIAGNOSIS — M545 Low back pain, unspecified: Secondary | ICD-10-CM | POA: Insufficient documentation

## 2011-04-29 DIAGNOSIS — M5126 Other intervertebral disc displacement, lumbar region: Secondary | ICD-10-CM | POA: Insufficient documentation

## 2011-06-07 ENCOUNTER — Other Ambulatory Visit: Payer: Self-pay | Admitting: Obstetrics & Gynecology

## 2011-06-07 ENCOUNTER — Other Ambulatory Visit (HOSPITAL_COMMUNITY)
Admission: RE | Admit: 2011-06-07 | Discharge: 2011-06-07 | Disposition: A | Discharge status: Home / Self Care | Payer: PRIVATE HEALTH INSURANCE | Source: Ambulatory Visit | Attending: Obstetrics & Gynecology | Admitting: Obstetrics & Gynecology

## 2011-06-07 DIAGNOSIS — Z01419 Encounter for gynecological examination (general) (routine) without abnormal findings: Secondary | ICD-10-CM | POA: Insufficient documentation

## 2011-09-11 ENCOUNTER — Emergency Department (HOSPITAL_COMMUNITY)
Admission: EM | Admit: 2011-09-11 | Discharge: 2011-09-11 | Disposition: A | Discharge status: Home / Self Care | Payer: PRIVATE HEALTH INSURANCE | Attending: Emergency Medicine | Admitting: Emergency Medicine

## 2011-09-11 ENCOUNTER — Emergency Department (HOSPITAL_COMMUNITY): Payer: PRIVATE HEALTH INSURANCE

## 2011-09-11 ENCOUNTER — Encounter: Payer: Self-pay | Admitting: *Deleted

## 2011-09-11 ENCOUNTER — Other Ambulatory Visit (HOSPITAL_COMMUNITY): Payer: Self-pay | Admitting: Family Medicine

## 2011-09-11 DIAGNOSIS — F17200 Nicotine dependence, unspecified, uncomplicated: Secondary | ICD-10-CM | POA: Insufficient documentation

## 2011-09-11 DIAGNOSIS — M542 Cervicalgia: Secondary | ICD-10-CM

## 2011-09-11 DIAGNOSIS — R569 Unspecified convulsions: Secondary | ICD-10-CM | POA: Insufficient documentation

## 2011-09-11 DIAGNOSIS — Z87442 Personal history of urinary calculi: Secondary | ICD-10-CM | POA: Insufficient documentation

## 2011-09-11 DIAGNOSIS — R202 Paresthesia of skin: Secondary | ICD-10-CM

## 2011-09-11 DIAGNOSIS — D6800 Von Willebrand disease, unspecified: Secondary | ICD-10-CM | POA: Insufficient documentation

## 2011-09-11 DIAGNOSIS — M549 Dorsalgia, unspecified: Secondary | ICD-10-CM

## 2011-09-11 DIAGNOSIS — R209 Unspecified disturbances of skin sensation: Secondary | ICD-10-CM | POA: Insufficient documentation

## 2011-09-11 DIAGNOSIS — R200 Anesthesia of skin: Secondary | ICD-10-CM

## 2011-09-11 DIAGNOSIS — IMO0002 Reserved for concepts with insufficient information to code with codable children: Secondary | ICD-10-CM | POA: Insufficient documentation

## 2011-09-11 DIAGNOSIS — M546 Pain in thoracic spine: Secondary | ICD-10-CM | POA: Insufficient documentation

## 2011-09-11 HISTORY — DX: Dorsalgia, unspecified: M54.9

## 2011-09-11 HISTORY — DX: Reserved for concepts with insufficient information to code with codable children: IMO0002

## 2011-09-11 HISTORY — DX: Unspecified convulsions: R56.9

## 2011-09-11 HISTORY — DX: Bell's palsy: G51.0

## 2011-09-11 MED ORDER — DIAZEPAM 5 MG PO TABS
10.0000 mg | ORAL_TABLET | Freq: Once | ORAL | Status: AC
  Administered 2011-09-11: 10 mg via ORAL
  Filled 2011-09-11: qty 2

## 2011-09-11 MED ORDER — HYDROCODONE-ACETAMINOPHEN 5-325 MG PO TABS
2.0000 | ORAL_TABLET | Freq: Once | ORAL | Status: AC
  Administered 2011-09-11: 2 via ORAL
  Filled 2011-09-11: qty 2

## 2011-09-11 MED ORDER — HYDROCODONE-ACETAMINOPHEN 5-325 MG PO TABS: ORAL_TABLET | ORAL | Status: DC

## 2011-09-11 MED ORDER — ONDANSETRON HCL 4 MG PO TABS
4.0000 mg | ORAL_TABLET | Freq: Once | ORAL | Status: AC
  Administered 2011-09-11: 4 mg via ORAL
  Filled 2011-09-11: qty 1

## 2011-09-11 MED ORDER — DIAZEPAM 5 MG PO TABS: ORAL_TABLET | ORAL | Status: DC

## 2011-09-11 NOTE — ED Provider Notes (Signed)
History     CSN: [id number] Arrival date & time: 09/11/2011  4:27 PM   First MD Initiated Contact with Patient 09/11/11 1641      Chief Complaint  Patient presents with  . Back Pain  . Numbness    facial    (Consider location/radiation/quality/duration/timing/severity/associated sxs/prior treatment) HPI Comments: Pt states she made a sudden move and "popped" her upper back. She reports that she has degenerative disc and bulging disc of her upper, mid, and lower spine. She described her symptoms to a member of Dr Lamar Blinks team and was told to come to ED for evaluation. She c/o numbness and tingling and pain. Not dropping objects. Able to raise and use upper extremities.  Patient is a 30 y.o. female presenting with back pain. The history is provided by the patient.  Back Pain  This is a new problem. The current episode started yesterday. The problem has been gradually worsening. Associated with: sudden movement. Pain location: cervical spine and left shoulder. The quality of the pain is described as shooting (numbness). The pain is severe. The pain is the same all the time. Associated symptoms include paresthesias. Pertinent negatives include no chest pain, no bowel incontinence, no perianal numbness and no bladder incontinence. Treatments tried: her own medications.    Past Medical History  Diagnosis Date  . Von Willebrand disease   . Seizures   . Bell's palsy   . Back pain   . Kidney stone   . Degenerative disc disease     Past Surgical History  Procedure Date  . Oophorectomy     left side    History reviewed. No pertinent family history.  History  Substance Use Topics  . Smoking status: Current Everyday Smoker -- 1.0 packs/day  . Smokeless tobacco: Not on file  . Alcohol Use: No    OB History    Grav Para Term Preterm Abortions TAB SAB Ect Mult Living                  Review of Systems  Cardiovascular: Negative for chest pain.  Gastrointestinal: Negative for  bowel incontinence.  Genitourinary: Negative for bladder incontinence.  Musculoskeletal: Positive for back pain.  Neurological: Positive for paresthesias.    Allergies  Review of patient's allergies indicates no known allergies.  Home Medications   Current Outpatient Rx  Name Route Sig Dispense Refill  . DICLOFENAC SODIUM 1 % TD GEL Topical Apply 1 application topically daily as needed. For pain     . ETONOGESTREL-ETHINYL ESTRADIOL 0.12-0.015 MG/24HR VA RING Vaginal Place 1 each vaginally every 28 (twenty-eight) days. Insert vaginally and leave in place for 3 consecutive weeks, then remove for 1 week.     [name] HYDROCODONE-ACETAMINOPHEN 5-500 MG PO TABS Oral Take 1 tablet by mouth daily as needed. For pain     . METHOCARBAMOL 750 MG PO TABS Oral Take 750 mg by mouth daily as needed. For spasms and/or pain     . NABUMETONE 500 MG PO TABS Oral Take 500 mg by mouth 2 (two) times daily.      . TRAMADOL HCL 50 MG PO TABS Oral Take 50 mg by mouth every 6 (six) hours as needed. For pain Maximum dose= 8 tablets per day     . DIAZEPAM 5 MG PO TABS  1 po tid for spasm 15 tablet 0  . HYDROCODONE-ACETAMINOPHEN 5-325 MG PO TABS  1 po tid for pain 15 tablet 0    BP 142/81  Pulse 89  Temp(Src) 98.6 F (37 C) (Oral)  Resp 18  Ht 5\' 3"  (1.6 m)  Wt 138 lb (62.596 kg)  BMI 24.45 kg/m2  SpO2 100%  Physical Exam  Nursing note and vitals reviewed. Constitutional: She is oriented to person, place, and time. She appears well-developed and well-nourished.  Non-toxic appearance.  HENT:  Head: Normocephalic.  Right Ear: Tympanic membrane and external ear normal.  Left Ear: Tympanic membrane and external ear normal.  Eyes: EOM and lids are normal. Pupils are equal, round, and reactive to light.  Neck: Normal range of motion. Neck supple. Carotid bruit is not present. No tracheal deviation present.       Pain to palpation of the c-spine extending to the left shoulder.  Cardiovascular: Normal rate, regular  rhythm, intact distal pulses and normal pulses.   Murmur heard. Pulmonary/Chest: Breath sounds normal. No respiratory distress.  Abdominal: Soft. Bowel sounds are normal. There is no tenderness. There is no guarding.  Musculoskeletal: Normal range of motion.  Lymphadenopathy:       Head (right side): No submandibular adenopathy present.       Head (left side): No submandibular adenopathy present.    She has no cervical adenopathy.  Neurological: She is alert and oriented to person, place, and time. She has normal strength. She displays no atrophy. No cranial nerve deficit or sensory deficit. She exhibits normal muscle tone. She displays no seizure activity. Coordination and gait normal. GCS eye subscore is 4. GCS verbal subscore is 5. GCS motor subscore is 6.  Skin: Skin is warm and dry.  Psychiatric: Her speech is normal. Her mood appears anxious.    ED Course: Gait  And grip rechecked, wnl. Speech clear during ED visit. Pt eating hamburgers and drinking drinks and conversing with family. Holding sandwich and drink with out problem.  Procedures (including critical care time)  Labs Reviewed - No data to display Ct Cervical Spine Wo Contrast  09/11/2011  *RADIOLOGY REPORT*  Clinical Data: Neck pain and numbness left shoulder.  CT CERVICAL SPINE WITHOUT CONTRAST  Technique:  Multidetector CT imaging of the cervical spine was performed. Multiplanar CT image reconstructions were also generated.  Comparison: MRI 08/10/2010  Findings: MRI is more sensitive for evaluation of disc disease. The prior MRI revealed a shallow disc protrusions at C3-4 and disc bulging at C6-7.  Normal cervical alignment.  Negative for fracture.  No acute bony abnormality.  Mild disc degeneration and mild spurring on the right at C3-4.  Mild disc degeneration with early spurring at C4-5.  No significant bony spinal stenosis.  IMPRESSION: Mild cervical degenerative changes.  No acute bony abnormality. Mild cervical disc disease  is present on MRI 1 year ago.  Original Report Authenticated By: Camelia Phenes, M.D.     1. Pain, upper back   2. Paresthesia       MDM  I have reviewed nursing notes, vital signs, and all appropriate lab and imaging results for this patient. Test results reviewed and compared to 1 year ago. Exam findings reviewed. Safe for pt to go home. MRI of c-spine scheduled for 10/25.        Kathie Dike, [place] 09/11/11 1958

## 2011-09-11 NOTE — ED Notes (Signed)
Pt c/o pain to upper back and numbness to left side of face. Pt states she popped her back last pm and then sx began. Pt c/o swelling to left arm.

## 2011-09-11 NOTE — ED Provider Notes (Signed)
Medical screening examination/treatment/procedure(s) were performed by non-physician practitioner and as supervising physician I was immediately available for consultation/collaboration.  Azani Brogdon R. Dorsie Sethi, MD 09/11/11 2354 

## 2011-09-11 NOTE — ED Notes (Signed)
Pt a/ox4. Resp even and unlabored. D/C instructions and Rx x2 reviewed with pt. Pt verbalized understanding. Pt ambulated to POV with steady gate. Friends to transport home.

## 2011-09-12 ENCOUNTER — Ambulatory Visit (HOSPITAL_COMMUNITY)
Admission: RE | Admit: 2011-09-12 | Discharge: 2011-09-12 | Disposition: A | Discharge status: Home / Self Care | Payer: PRIVATE HEALTH INSURANCE | Source: Ambulatory Visit | Attending: Family Medicine | Admitting: Family Medicine

## 2011-09-12 DIAGNOSIS — M542 Cervicalgia: Secondary | ICD-10-CM

## 2011-09-12 DIAGNOSIS — R200 Anesthesia of skin: Secondary | ICD-10-CM

## 2011-09-12 DIAGNOSIS — M5020 Other cervical disc displacement, unspecified cervical region: Secondary | ICD-10-CM | POA: Insufficient documentation

## 2011-09-12 DIAGNOSIS — R209 Unspecified disturbances of skin sensation: Secondary | ICD-10-CM | POA: Insufficient documentation

## 2011-10-14 DIAGNOSIS — D6800 Von Willebrand disease, unspecified: Secondary | ICD-10-CM | POA: Insufficient documentation

## 2011-10-14 DIAGNOSIS — Z87898 Personal history of other specified conditions: Secondary | ICD-10-CM | POA: Insufficient documentation

## 2012-01-05 ENCOUNTER — Emergency Department (HOSPITAL_COMMUNITY)
Admission: EM | Admit: 2012-01-05 | Discharge: 2012-01-05 | Disposition: A | Discharge status: Home / Self Care | Payer: PRIVATE HEALTH INSURANCE | Attending: Emergency Medicine | Admitting: Emergency Medicine

## 2012-01-05 ENCOUNTER — Encounter (HOSPITAL_COMMUNITY): Payer: Self-pay

## 2012-01-05 DIAGNOSIS — R269 Unspecified abnormalities of gait and mobility: Secondary | ICD-10-CM | POA: Insufficient documentation

## 2012-01-05 DIAGNOSIS — F17200 Nicotine dependence, unspecified, uncomplicated: Secondary | ICD-10-CM | POA: Insufficient documentation

## 2012-01-05 DIAGNOSIS — IMO0002 Reserved for concepts with insufficient information to code with codable children: Secondary | ICD-10-CM | POA: Insufficient documentation

## 2012-01-05 DIAGNOSIS — Z87442 Personal history of urinary calculi: Secondary | ICD-10-CM | POA: Insufficient documentation

## 2012-01-05 DIAGNOSIS — IMO0001 Reserved for inherently not codable concepts without codable children: Secondary | ICD-10-CM | POA: Insufficient documentation

## 2012-01-05 DIAGNOSIS — M545 Low back pain, unspecified: Secondary | ICD-10-CM | POA: Insufficient documentation

## 2012-01-05 DIAGNOSIS — M25559 Pain in unspecified hip: Secondary | ICD-10-CM | POA: Insufficient documentation

## 2012-01-05 DIAGNOSIS — L989 Disorder of the skin and subcutaneous tissue, unspecified: Secondary | ICD-10-CM | POA: Insufficient documentation

## 2012-01-05 DIAGNOSIS — Z79899 Other long term (current) drug therapy: Secondary | ICD-10-CM | POA: Insufficient documentation

## 2012-01-05 DIAGNOSIS — M79609 Pain in unspecified limb: Secondary | ICD-10-CM | POA: Insufficient documentation

## 2012-01-05 DIAGNOSIS — D6800 Von Willebrand disease, unspecified: Secondary | ICD-10-CM | POA: Insufficient documentation

## 2012-01-05 HISTORY — DX: Fibromyalgia: M79.7

## 2012-01-05 HISTORY — DX: Spondylosis, unspecified: M47.9

## 2012-01-05 HISTORY — DX: Reserved for concepts with insufficient information to code with codable children: IMO0002

## 2012-01-05 MED ORDER — HYDROMORPHONE HCL PF 2 MG/ML IJ SOLN
2.0000 mg | Freq: Once | INTRAMUSCULAR | Status: AC
  Administered 2012-01-05: 2 mg via INTRAMUSCULAR
  Filled 2012-01-05: qty 1

## 2012-01-05 MED ORDER — ONDANSETRON HCL 4 MG PO TABS
4.0000 mg | ORAL_TABLET | Freq: Once | ORAL | Status: AC
  Administered 2012-01-05: 4 mg via ORAL
  Filled 2012-01-05: qty 1

## 2012-01-05 MED ORDER — OXYCODONE-ACETAMINOPHEN 5-325 MG PO TABS: 2.0000 | ORAL_TABLET | ORAL | Status: AC | PRN

## 2012-01-05 NOTE — ED Notes (Signed)
Pt presents with left sided hip and knee pain that started last night. Pt denies injury. Pt states "my leg started catching last night and I couldn't walk". Pt also states "my equilibrium is off also".

## 2012-01-05 NOTE — ED Provider Notes (Signed)
History  Scribed for [employer]. Colon Branch, MD, the patient was seen in room [facility]. This chart was scribed by Candelaria Stagers. The patient's care started at 12:46 PM    CSN: [id number]  Arrival date & time 01/05/12  1059   First MD Initiated Contact with Patient 01/05/12 1217      Chief Complaint  Patient presents with  . Leg Pain     HPI Theresa Washington is a 31 y.o. female who presents to the Emergency Department complaining of left leg pain that began last night.  She states that she feels a "pinching" in her hip and cannot stand on the left leg.  She states that the pain radiates to her lower back and is a 10/10.  She denies tingling and denies falling or injuring leg.  She appears uncomfortable.  She is taking tramadol, valum, vicodin, and relafen with no relief.  Here PCP is at [facility] and she reports that Dr. Regino Schultze told her she should have an MRI of this hip.  She has h/o degenerative disc disease, spondylosis, and fibromyalgia.      HPI ELEMENTS:  Location: left leg Onset: yesterday Duration:  Timing: constant  Quality: pinching pain in hip, "excrutiating" Severity: 10/10 Modifying factors: nothing seems to make better or worse  Context:  as above  Associated symptoms: radiates to lower back      Past Medical History  Diagnosis Date  . Von Willebrand disease   . Seizures   . Bell's palsy   . Back pain   . Kidney stone   . Degenerative disc disease   . DDD (degenerative disc disease)   . Spondylosis   . Fibromyalgia     Past Surgical History  Procedure Date  . Oophorectomy     left side    No family history on file.  History  Substance Use Topics  . Smoking status: Current Everyday Smoker -- 1.0 packs/day  . Smokeless tobacco: Not on file  . Alcohol Use: No    OB History    Grav Para Term Preterm Abortions TAB SAB Ect Mult Living                  Review of Systems  Musculoskeletal: Positive for back pain (lower back pain),  arthralgias (leg hip) and gait problem (trouble standing).  All other systems reviewed and are negative.    Allergies  Other  Home Medications   Current Outpatient Rx  Name Route Sig Dispense Refill  . DIAZEPAM 5 MG PO TABS  1 po tid for spasm 15 tablet 0  . DICLOFENAC SODIUM 1 % TD GEL Topical Apply 1 application topically daily as needed. For pain     . ETONOGESTREL-ETHINYL ESTRADIOL 0.12-0.015 MG/24HR VA RING Vaginal Place 1 each vaginally every 28 (twenty-eight) days. Insert vaginally and leave in place for 3 consecutive weeks, then remove for 1 week.     [name] HYDROCODONE-ACETAMINOPHEN 5-325 MG PO TABS  1 po tid for pain 15 tablet 0  . HYDROCODONE-ACETAMINOPHEN 5-500 MG PO TABS Oral Take 1 tablet by mouth daily as needed. For pain     . METHOCARBAMOL 750 MG PO TABS Oral Take 750 mg by mouth daily as needed. For spasms and/or pain     . NABUMETONE 500 MG PO TABS Oral Take 500 mg by mouth 2 (two) times daily.      . TRAMADOL HCL 50 MG PO TABS Oral Take 50 mg by mouth every 6 (six) hours  as needed. For pain Maximum dose= 8 tablets per day       BP 137/76  Pulse 105  Temp(Src) 97.8 F (36.6 C) (Oral)  Resp 20  Ht 4' 9.25" (1.454 m)  Wt 133 lb (60.328 kg)  BMI 28.53 kg/m2  SpO2 100%  Physical Exam  Nursing note and vitals reviewed. Constitutional: She is oriented to person, place, and time. She appears well-developed and well-nourished. She appears distressed.  HENT:  Head: Normocephalic.  Right Ear: External ear normal.  Left Ear: External ear normal.  Nose: Nose normal.  Mouth/Throat: Oropharynx is clear and moist.  Eyes: EOM are normal. Pupils are equal, round, and reactive to light.  Neck: Normal range of motion.  Cardiovascular: Normal rate, normal heart sounds and intact distal pulses.   Pulmonary/Chest: Effort normal and breath sounds normal.  Abdominal: Soft.  Musculoskeletal:       Pain with movement of left hip, no deformity. Focal tenderness to left lumbar  paraspinal muscles. Tenderness to left sacrum with palpation.  Neurological: She is alert and oriented to person, place, and time.  Skin: Skin is warm and dry.       Multiple excoriated lesions to bilateral lower legs.     ED Course  Procedures  DIAGNOSTIC STUDIES: Oxygen Saturation is 100% on room air, normal by my interpretation.    COORDINATION OF CARE:       MDM  Patient with chronic pain that has developed pain to the left hip and buttock that increases with ambulation.PCp asked that she be set up for MRI. Given analgesic. MRI request made. Radiology department will contact patient for OP time.  Pt stable in ED with no significant deterioration in condition.The patient appears reasonably screened and/or stabilized for discharge and I doubt any other medical condition or other [facility] requiring further screening, evaluation, or treatment in the ED at this time prior to discharge.  I personally performed the services described in this documentation, which was scribed in my presence. The recorded information has been reviewed and considered. MDM Reviewed: nursing note and vitals     Nicoletta Dress. Colon Branch, MD 01/05/12 1434

## 2012-01-05 NOTE — ED Notes (Signed)
Here mainly for pain to left hip, pinching in nature per pt, and not able to put weight on left leg; lesions to bilateral lower legs as well; and also has spells of spacing out, hx of seizure at age of 44

## 2012-01-06 ENCOUNTER — Ambulatory Visit (HOSPITAL_COMMUNITY)
Admission: RE | Admit: 2012-01-06 | Discharge: 2012-01-06 | Disposition: A | Discharge status: Home / Self Care | Payer: PRIVATE HEALTH INSURANCE | Source: Ambulatory Visit | Attending: Emergency Medicine | Admitting: Emergency Medicine

## 2012-01-06 ENCOUNTER — Other Ambulatory Visit (HOSPITAL_COMMUNITY): Payer: Self-pay | Admitting: Emergency Medicine

## 2012-01-06 DIAGNOSIS — R262 Difficulty in walking, not elsewhere classified: Secondary | ICD-10-CM | POA: Insufficient documentation

## 2012-01-06 DIAGNOSIS — M51379 Other intervertebral disc degeneration, lumbosacral region without mention of lumbar back pain or lower extremity pain: Secondary | ICD-10-CM | POA: Insufficient documentation

## 2012-01-06 DIAGNOSIS — IMO0002 Reserved for concepts with insufficient information to code with codable children: Secondary | ICD-10-CM | POA: Insufficient documentation

## 2012-01-06 DIAGNOSIS — R52 Pain, unspecified: Secondary | ICD-10-CM

## 2012-01-06 DIAGNOSIS — M545 Low back pain, unspecified: Secondary | ICD-10-CM | POA: Insufficient documentation

## 2012-01-06 DIAGNOSIS — M5137 Other intervertebral disc degeneration, lumbosacral region: Secondary | ICD-10-CM | POA: Insufficient documentation

## 2012-01-08 NOTE — Progress Notes (Signed)
01/05/02  1830  MRI of lumbar spine completed.  Mild spinal stenosis at L4-5 Small central disc protrusion. Unchanged from 04/29/2011. Small left sided disc protrusion L5-S1, unchanged from 04/29/2011.  Reviewed results with patient and her husband. She is to follow up with [facility].

## 2012-02-25 DIAGNOSIS — IMO0001 Reserved for inherently not codable concepts without codable children: Secondary | ICD-10-CM | POA: Insufficient documentation

## 2012-04-26 ENCOUNTER — Emergency Department (HOSPITAL_COMMUNITY)
Admission: EM | Admit: 2012-04-26 | Discharge: 2012-04-26 | Disposition: A | Discharge status: Home / Self Care | Payer: Self-pay | Source: Home / Self Care | Attending: Emergency Medicine | Admitting: Emergency Medicine

## 2012-04-26 ENCOUNTER — Encounter (HOSPITAL_COMMUNITY): Payer: Self-pay | Admitting: *Deleted

## 2012-04-26 DIAGNOSIS — R609 Edema, unspecified: Secondary | ICD-10-CM

## 2012-04-26 NOTE — ED Notes (Signed)
Pt is here with complaints of generalized swelling in neck, legs, and arms with facial pressure.  Pt has hx of ankylosing spondylitis and is currently being treated by her PCP, Dr. Phillips Odor, and Dr. Ollen Bowl (neurosurgery).  Pt was given a nerve block in the back of her neck by Dr. Ollen Bowl last week and was given a 30 day course of prednisone by Dr. Phillips Odor.  Pt also reports cough, but states she was treated for this with doxycycline.

## 2012-04-26 NOTE — ED Provider Notes (Signed)
Medical screening examination/treatment/procedure(s) were performed by non-physician practitioner and as supervising physician I was immediately available for consultation/collaboration.  Luiz Blare MD   Luiz Blare, MD 04/26/12 2019

## 2012-04-26 NOTE — ED Provider Notes (Signed)
History     CSN: [id number]  Arrival date & time 04/26/12  1646   First MD Initiated Contact with Patient 04/26/12 1813      Chief Complaint  Patient presents with  . Joint Swelling    (Consider location/radiation/quality/duration/timing/severity/associated sxs/prior treatment) HPI Comments: Pt with edema.   Has multiple medical problems and sees multiple specialties (neurosurgery, primary care, rheumatology, neuropsychiatry).  4 times a year, pcp puts pt on 30 day course of prednisone.  Usually this helps pt to feel better and many of her frequent sx resolve.  Finished a prednisone course on 6/3).  On 6/4 received depo-medrol injection in her R upper back by neurosurgeon along with "nerve block" to help pt's ankylosing spondylitis.  Yesterday, 6/8, BLE began swelling, and now pt has swelling to neck/shoulders, BUE. C/o pressure feeling in neck and head, worse with position changes.  Called neurosurgeon about swelling and neck pressure, was told to go to [facility] or ER to make sure she does not have meningitis.  Nothing makes swelling better, nothing makes it worse.  Denies fever or any other sx.   The history is provided by the patient.    Past Medical History  Diagnosis Date  . Von Willebrand disease   . Seizures   . Bell's palsy   . Back pain   . Kidney stone   . Degenerative disc disease   . DDD (degenerative disc disease)   . Spondylosis   . Fibromyalgia   . DDD (degenerative disc disease)   . Ankylosing spondylitis   . Fibromyalgia     Past Surgical History  Procedure Date  . Oophorectomy     left side    No family history on file.  History  Substance Use Topics  . Smoking status: Current Everyday Smoker -- 1.0 packs/day  . Smokeless tobacco: Not on file  . Alcohol Use: No    OB History    Grav Para Term Preterm Abortions TAB SAB Ect Mult Living                  Review of Systems  Constitutional: Negative for fever and chills.  HENT: Positive for neck pain.         Neck pain is actually neck pressure  Respiratory: Negative for chest tightness, shortness of breath and wheezing.   Musculoskeletal:       Edema in multiple sites  Skin: Negative for color change, rash and wound.    Allergies  Other and Shrimp  Home Medications   Current Outpatient Rx  Name Route Sig Dispense Refill  . CYCLOBENZAPRINE HCL 10 MG PO TABS Oral Take 10 mg by mouth 3 (three) times daily as needed.    [name] LAMOTRIGINE 200 MG PO TABS Oral Take 200 mg by mouth daily.    [name] CLOBETASOL PROPIONATE 0.05 % EX OINT Topical Apply 1 application topically at bedtime.    [name] DIAZEPAM 5 MG PO TABS Oral Take 5 mg by mouth 3 (three) times daily.    [name] DICLOFENAC SODIUM 1 % TD GEL Topical Apply 1 application topically daily as needed. For pain     . ETONOGESTREL-ETHINYL ESTRADIOL 0.12-0.015 MG/24HR VA RING Vaginal Place 1 each vaginally every 28 (twenty-eight) days. Insert vaginally and leave in place for 3 consecutive weeks, then remove for 1 week.     [name] HYDROCODONE-ACETAMINOPHEN 5-500 MG PO TABS Oral Take 1 tablet by mouth daily as needed. For pain    . METHYLCELLULOSE 1 % OP  SOLN Both Eyes Place 1 drop into both eyes daily as needed. Dry Eyes    . NABUMETONE 500 MG PO TABS Oral Take 500 mg by mouth 2 (two) times daily.     [name] PREDNISONE 10 MG PO TABS Oral Take 10 mg by mouth daily as needed. Inflamation    . TRAMADOL HCL 50 MG PO TABS Oral Take 50 mg by mouth 4 (four) times daily.       BP 119/72  Pulse 102  Temp(Src) 98.1 F (36.7 C) (Oral)  Resp 23  SpO2 99%  Physical Exam  Constitutional: She appears well-developed and well-nourished. No distress.       Pt has cushingoid appearance, moon facies and buffalo hump.   Neck: Trachea normal and normal range of motion. No spinous process tenderness and no muscular tenderness present. No rigidity. Edema present. No erythema present. No Brudzinski's sign and no Kernig's sign noted.  Cardiovascular: Normal rate and regular rhythm.     Pulses:      Dorsalis pedis pulses are 1+ on the right side, and 1+ on the left side.  Pulmonary/Chest: Effort normal and breath sounds normal. No respiratory distress. She has no wheezes.  Musculoskeletal: She exhibits edema.       BLE with 2+pitting edema, nonpitting edema in BUE, posterior neck, posterior and anterior shoulders.      ED Course  Procedures (including critical care time)  Labs Reviewed - No data to display No results found.   1. Edema       MDM  Discussed with Dr. Chaney Malling and Dr. Betti Cruz.  Pt has no meningeal signs or sx.  No evidence acute infection.  Edema most likely related to tx with long term steroids.  Pt to f/u with pcp.  Reviewed with pt and significant other reasons for going to Er.         Cathlyn Parsons, NP 04/26/12 1829

## 2012-04-26 NOTE — Discharge Instructions (Signed)
If you develop a fever and neck pain different from your usual, different from the pressure feeling from the swelling, go to the ER.  Otherwise, follow up with your primary doctor (the one who prescribes the prednisone for you) as soon as possible and your other doctors as scheduled.   Edema Edema is an abnormal build-up of fluids in tissues. Because this is partly dependent on gravity (water flows to the lowest place), it is more common in the leg sand thighs (lower extremities). It is also common in the looser tissues, like around the eyes. Painless swelling of the feet and ankles is common and increases as a person ages. It may affect both legs and may include the calves or even thighs. When squeezed, the fluid may move out of the affected area and may leave a dent for a few moments. CAUSES   Prolonged standing or sitting in one place for extended periods of time. Movement helps pump tissue fluid into the veins, and absence of movement prevents this, resulting in edema.   Varicose veins. The valves in the veins do not work as well as they should. This causes fluid to leak into the tissues.   Fluid and salt overload.   Injury, burn, or surgery to the leg, ankle, or foot, may damage veins and allow fluid to leak out.   Sunburn damages vessels. Leaky vessels allow fluid to go out into the sunburned tissues.   Allergies (from insect bites or stings, medications or chemicals) cause swelling by allowing vessels to become leaky.   Protein in the blood helps keep fluid in your vessels. Low protein, as in malnutrition, allows fluid to leak out.   Hormonal changes, including pregnancy and menstruation, cause fluid retention. This fluid may leak out of vessels and cause edema.   Medications that cause fluid retention. Examples are sex hormones, blood pressure medications, steroid treatment, or anti-depressants.   Some illnesses cause edema, especially heart failure, kidney disease, or liver disease.     Surgery that cuts veins or lymph nodes, such as surgery done for the heart or for breast cancer, may result in edema.  DIAGNOSIS  Your caregiver is usually easily able to determine what is causing your swelling (edema) by simply asking what is wrong (getting a history) and examining you (doing a physical). Sometimes x-rays, EKG (electrocardiogram or heart tracing), and blood work may be done to evaluate for underlying medical illness. TREATMENT  General treatment includes:  Leg elevation (or elevation of the affected body part).   Restriction of fluid intake.   Prevention of fluid overload.   Compression of the affected body part. Compression with elastic bandages or support stockings squeezes the tissues, preventing fluid from entering and forcing it back into the blood vessels.   Diuretics (also called water pills or fluid pills) pull fluid out of your body in the form of increased urination. These are effective in reducing the swelling, but can have side effects and must be used only under your caregiver's supervision. Diuretics are appropriate only for some types of edema.  The specific treatment can be directed at any underlying causes discovered. Heart, liver, or kidney disease should be treated appropriately. HOME CARE INSTRUCTIONS   Elevate the legs (or affected body part) above the level of the heart, while lying down.   Avoid sitting or standing still for prolonged periods of time.   Avoid putting anything directly under the knees when lying down, and do not wear constricting clothing or garters  on the upper legs.   Exercising the legs causes the fluid to work back into the veins and lymphatic channels. This may help the swelling go down.   The pressure applied by elastic bandages or support stockings can help reduce ankle swelling.   A low-salt diet may help reduce fluid retention and decrease the ankle swelling.   Take any medications exactly as prescribed.  SEEK  MEDICAL CARE IF:  Your edema is not responding to recommended treatments. SEEK IMMEDIATE MEDICAL CARE IF:   You develop shortness of breath or chest pain.   You cannot breathe when you lay down; or if, while lying down, you have to get up and go to the window to get your breath.   You are having increasing swelling without relief from treatment.   You develop a fever over 102 F (38.9 C).   You develop pain or redness in the areas that are swollen.   Tell your caregiver right away if you have gained 3 lb/1.4 kg in 1 day or 5 lb/2.3 kg in a week.  MAKE SURE YOU:   Understand these instructions.   Will watch your condition.   Will get help right away if you are not doing well or get worse.  Document Released: 11/04/2005 Document Revised: 10/24/2011 Document Reviewed: 06/22/2008 [facility] Patient Information 2012 [place], [place].

## 2012-04-28 DIAGNOSIS — F411 Generalized anxiety disorder: Secondary | ICD-10-CM

## 2012-04-28 DIAGNOSIS — G40309 Generalized idiopathic epilepsy and epileptic syndromes, not intractable, without status epilepticus: Secondary | ICD-10-CM

## 2012-04-28 DIAGNOSIS — R413 Other amnesia: Secondary | ICD-10-CM

## 2012-05-05 DIAGNOSIS — G40309 Generalized idiopathic epilepsy and epileptic syndromes, not intractable, without status epilepticus: Secondary | ICD-10-CM

## 2012-05-05 DIAGNOSIS — F411 Generalized anxiety disorder: Secondary | ICD-10-CM

## 2012-05-05 DIAGNOSIS — R413 Other amnesia: Secondary | ICD-10-CM

## 2012-05-14 DIAGNOSIS — R413 Other amnesia: Secondary | ICD-10-CM

## 2012-05-14 DIAGNOSIS — G40309 Generalized idiopathic epilepsy and epileptic syndromes, not intractable, without status epilepticus: Secondary | ICD-10-CM

## 2012-05-14 DIAGNOSIS — F411 Generalized anxiety disorder: Secondary | ICD-10-CM

## 2012-05-19 DIAGNOSIS — F411 Generalized anxiety disorder: Secondary | ICD-10-CM

## 2012-05-19 DIAGNOSIS — R413 Other amnesia: Secondary | ICD-10-CM

## 2012-05-19 DIAGNOSIS — G40309 Generalized idiopathic epilepsy and epileptic syndromes, not intractable, without status epilepticus: Secondary | ICD-10-CM

## 2012-05-25 ENCOUNTER — Encounter: Payer: Self-pay | Admitting: Physical Medicine & Rehabilitation

## 2012-06-09 ENCOUNTER — Encounter: Payer: BC Managed Care – PPO | Admitting: Physical Medicine & Rehabilitation

## 2012-07-13 ENCOUNTER — Encounter
Payer: BC Managed Care – PPO | Attending: Physical Medicine & Rehabilitation | Admitting: Physical Medicine & Rehabilitation

## 2012-07-21 ENCOUNTER — Emergency Department (HOSPITAL_COMMUNITY)
Admission: EM | Admit: 2012-07-21 | Discharge: 2012-07-21 | Disposition: A | Discharge status: Home / Self Care | Payer: Self-pay | Attending: Emergency Medicine | Admitting: Emergency Medicine

## 2012-07-21 ENCOUNTER — Emergency Department (HOSPITAL_COMMUNITY): Payer: Self-pay

## 2012-07-21 ENCOUNTER — Encounter (HOSPITAL_COMMUNITY): Payer: Self-pay | Admitting: *Deleted

## 2012-07-21 DIAGNOSIS — D6800 Von Willebrand disease, unspecified: Secondary | ICD-10-CM | POA: Insufficient documentation

## 2012-07-21 DIAGNOSIS — J189 Pneumonia, unspecified organism: Secondary | ICD-10-CM | POA: Insufficient documentation

## 2012-07-21 DIAGNOSIS — F17200 Nicotine dependence, unspecified, uncomplicated: Secondary | ICD-10-CM | POA: Insufficient documentation

## 2012-07-21 DIAGNOSIS — R05 Cough: Secondary | ICD-10-CM | POA: Insufficient documentation

## 2012-07-21 DIAGNOSIS — R059 Cough, unspecified: Secondary | ICD-10-CM | POA: Insufficient documentation

## 2012-07-21 DIAGNOSIS — R079 Chest pain, unspecified: Secondary | ICD-10-CM | POA: Insufficient documentation

## 2012-07-21 HISTORY — DX: Ventricular premature depolarization: I49.3

## 2012-07-21 LAB — BASIC METABOLIC PANEL
Calcium: 9.5 mg/dL (ref 8.4–10.5)
Creatinine, Ser: 0.72 mg/dL (ref 0.50–1.10)
GFR calc Af Amer: >90 mL/min (ref >90)
GFR calc non Af Amer: >90 mL/min (ref >90)
Sodium: 135 mEq/L (ref 135–145)

## 2012-07-21 LAB — CBC WITH DIFFERENTIAL/PLATELET
Basophils Absolute: 0 10*3/uL (ref 0.0–0.1)
Basophils Relative: 0 % (ref 0–1)
Eosinophils Relative: 1 % (ref 0–5)
HCT: 33.8 % — ABNORMAL LOW (ref 36.0–46.0)
Lymphocytes Relative: 21 % (ref 12–46)
MCHC: 33.7 g/dL (ref 30.0–36.0)
MCV: 89.2 fL (ref 78.0–100.0)
Monocytes Absolute: 0.6 10*3/uL (ref 0.1–1.0)
Neutro Abs: 9.7 10*3/uL — ABNORMAL HIGH (ref 1.7–7.7)
Platelets: 396 10*3/uL (ref 150–400)
RDW: 12.4 % (ref 11.5–15.5)
WBC: 13.2 10*3/uL — ABNORMAL HIGH (ref 4.0–10.5)

## 2012-07-21 LAB — D-DIMER, QUANTITATIVE: D-Dimer, Quant: 1.72 ug/mL-FEU — ABNORMAL HIGH (ref 0.00–0.48)

## 2012-07-21 MED ORDER — DEXTROSE 5 % IV SOLN
500.0000 mg | Freq: Once | INTRAVENOUS | Status: AC
  Administered 2012-07-21: 500 mg via INTRAVENOUS
  Filled 2012-07-21: qty 500

## 2012-07-21 MED ORDER — DEXTROSE 5 % IV SOLN
1.0000 g | Freq: Once | INTRAVENOUS | Status: AC
  Administered 2012-07-21: 1 g via INTRAVENOUS
  Filled 2012-07-21: qty 10

## 2012-07-21 MED ORDER — ALBUTEROL SULFATE (5 MG/ML) 0.5% IN NEBU
5.0000 mg | INHALATION_SOLUTION | Freq: Once | RESPIRATORY_TRACT | Status: AC
  Administered 2012-07-21: 5 mg via RESPIRATORY_TRACT
  Filled 2012-07-21: qty 1

## 2012-07-21 MED ORDER — IOHEXOL 350 MG/ML SOLN
100.0000 mL | Freq: Once | INTRAVENOUS | Status: AC | PRN
  Administered 2012-07-21: 100 mL via INTRAVENOUS

## 2012-07-21 MED ORDER — DOXYCYCLINE HYCLATE 100 MG PO CAPS: 100.0000 mg | ORAL_CAPSULE | Freq: Two times a day (BID) | ORAL | Status: AC

## 2012-07-21 MED ORDER — SODIUM CHLORIDE 0.9 % IV BOLUS (SEPSIS)
500.0000 mL | Freq: Once | INTRAVENOUS | Status: AC
  Administered 2012-07-21: 500 mL via INTRAVENOUS

## 2012-07-21 MED ORDER — KETOROLAC TROMETHAMINE 30 MG/ML IJ SOLN
30.0000 mg | Freq: Once | INTRAMUSCULAR | Status: AC
  Administered 2012-07-21: 30 mg via INTRAVENOUS
  Filled 2012-07-21: qty 1

## 2012-07-21 NOTE — ED Notes (Signed)
Pt c/o cough and difficulty breathing x3 months. Pt also c/o sharp pain on left side which is new. Pt states cough is nonproductive. Pt has hx of pneumonia and states these symptoms are similar. Pt has been tx with abx within last 2 months but no relief.

## 2012-07-21 NOTE — ED Notes (Signed)
Pt c/o left side rib chest pain worse with cough, deep breathing, admits to cough for three months, pt states " I think I have pneumonia", advises that she has had pneumonia before and felt the same way.

## 2012-07-21 NOTE — Progress Notes (Signed)
Antibiotic doses reviewed for renal function; Pt currently on Rocephin and Zithromax for PNA which do not require renal dose adjustment. Doses ordered appropriate for current renal function.  Pharmacy will sign off.  Thank you. Junita Push, PharmD, BCPS 07/21/2012@5 :09 PM

## 2012-07-21 NOTE — ED Notes (Signed)
Pt requesting to have ekg done, ekg done per request. Pt states " I want to make sure my heart is ok"

## 2012-07-21 NOTE — ED Provider Notes (Signed)
History  This chart was scribed for Joya Gaskins, MD by Bennett Scrape. This patient was seen in room [facility]2/AP[facility]2 and the patient's care was started at 3:31PM.  CSN: [id number]  Arrival date & time 07/21/12  1241   First MD Initiated Contact with Patient 07/21/12 1531      Chief Complaint  Patient presents with  . Cough  . Pleurisy    Patient is a 31 y.o. female presenting with cough. The history is provided by the patient. No language interpreter was used.  Cough This is a recurrent problem. The current episode started more than 1 week ago. The problem occurs constantly. The problem has been gradually worsening. The cough is productive of brown sputum. There has been no fever. Associated symptoms include chest pain. Pertinent negatives include no chills and no shortness of breath. She is a smoker. Her past medical history does not include bronchitis, COPD or asthma.    Theresa Washington is a 31 y.o. female who presents to the Emergency Department complaining of 3 months of gradual onset, gradually worsening, constant cough productive of brown phlegm with associated sharp shooting left-sided CP that radiates up into her shoulder and neck that started last night while coughing. She reports that she has been seen by her PCP and was given antibiotics (z-pac, augment, doxycycline) with no improvement. She states that she experienced a similar episode of CP with PNA several years ago. She denies any h/o PE/DVTs.she is currently on birth control (Nuvaring). She denies having a h/o asthma or other lung related problems. She denies fever, sore throat, visual disturbance, SOB, abdominal pain, nausea, emesis, diarrhea, urinary symptoms, back pain, HA, weakness, numbness and rash as associated symptoms. She has a h/o seizures, DDD, and fibromyalgia. She is a current everyday smoker but denies alcohol use.   PCP is Dr. Genia Hotter.  Past Medical History  Diagnosis Date  . Von Willebrand disease     . Seizures   . Bell's palsy   . Back pain   . Kidney stone   . Degenerative disc disease   . DDD (degenerative disc disease)   . Spondylosis   . Fibromyalgia   . DDD (degenerative disc disease)   . Ankylosing spondylitis   . Fibromyalgia   . PVC (premature ventricular contraction)   . Tachycardia   mitral valve stenosis which she states has since resolved   Past Surgical History  Procedure Date  . Oophorectomy     left side    History reviewed. No pertinent family history.  History  Substance Use Topics  . Smoking status: Current Everyday Smoker -- 1.0 packs/day  . Smokeless tobacco: Not on file  . Alcohol Use: No   No OB history provided.  Review of Systems  Constitutional: Negative for fever and chills.  Respiratory: Positive for cough. Negative for shortness of breath.   Cardiovascular: Positive for chest pain.  Gastrointestinal: Negative for nausea, vomiting and diarrhea.  All other systems reviewed and are negative.      Allergies  Shrimp and Other  Home Medications   Current Outpatient Rx  Name Route Sig Dispense Refill  . CLOBETASOL PROPIONATE 0.05 % EX OINT Topical Apply 1 application topically at bedtime.    . CYCLOBENZAPRINE HCL 10 MG PO TABS Oral Take 10 mg by mouth 3 (three) times daily as needed. For muscle spasms    . DIAZEPAM 5 MG PO TABS Oral Take 5 mg by mouth 3 (three) times daily as needed. For muscle  spasms    . DICLOFENAC SODIUM 1 % TD GEL Topical Apply 1 application topically daily as needed. For pain     . ESOMEPRAZOLE MAGNESIUM 40 MG PO CPDR Oral Take 40 mg by mouth daily before breakfast.    . HYDROCODONE-ACETAMINOPHEN 5-500 MG PO TABS Oral Take 1 tablet by mouth daily as needed. For pain    . LAMOTRIGINE 100 MG PO TABS Oral Take 100 mg by mouth 2 (two) times daily. For seizures    . METHYLCELLULOSE 1 % OP SOLN Both Eyes Place 1 drop into both eyes daily as needed. Dry Eyes    . DEPO-MEDROL IJ Injection Inject as directed once.  Received for L3 and L4    . NABUMETONE 500 MG PO TABS Oral Take 500 mg by mouth 2 (two) times daily.     . TRAMADOL HCL 50 MG PO TABS Oral Take 50 mg by mouth 4 (four) times daily.     . ETONOGESTREL-ETHINYL ESTRADIOL 0.12-0.015 MG/24HR VA RING Vaginal Place 1 each vaginally every 28 (twenty-eight) days. Insert vaginally and leave in place for 3 consecutive weeks, then remove for 1 week.       Triage Vitals: BP 142/94  Pulse 122  Temp 98.2 F (36.8 C)  Resp 20  Ht 5\' 3"  (1.6 m)  Wt 130 lb (58.968 kg)  BMI 23.03 kg/m2  SpO2 100%  Physical Exam  CONSTITUTIONAL: Well developed/well nourished HEAD AND FACE: Normocephalic/atraumatic EYES: EOMI/PERRL ENMT: Mucous membranes moist NECK: supple no meningeal signs SPINE:entire spine nontender CV: S1/S2 noted, no murmurs/rubs/gallops noted LUNGS: coarse breath sounds bilaterally ABDOMEN: soft, nontender, no rebound or guarding GU:no cva tenderness NEURO: Pt is awake/alert, moves all extremitiesx4 EXTREMITIES: pulses normal, full ROM SKIN: warm, color normal PSYCH: no abnormalities of mood noted  ED Course  Procedures  DIAGNOSTIC STUDIES: Oxygen Saturation is 100% on room air, normal by my interpretation.    COORDINATION OF CARE: 3:51PM-Informed pt of CXR results and advised pt that overnight admission for continuous IV antibiotics is probable. Discussed treatment plan which includes blood work, breathing treatment with pt at bedside and pt agreed to plan.   4:47PM-pt rechecked and reports that her symptoms have improved with the Tordaol, IV fluids and breathing treatment. Discussed ordering a CT scan of the chest to look for a PE with pt at bedside and pt agreed to plan.   5:34PM-Pt rechecked and is still feeling improved. Informed pt that her CT scan was negative for PEs. Advised pt that she can be discharged home if she can ambulate.    6:26PM-Pt rechecked and is ready to be discharged home. Advised pt to return tomorrow to be  seen or arrange to be seen by her PCP. She ambulated well, no hypoxia and no tachypnea.     Labs Reviewed  CBC WITH DIFFERENTIAL - Abnormal; Notable for the following:    WBC 13.2 (*)     RBC 3.79 (*)     Hemoglobin 11.4 (*)     HCT 33.8 (*)     Neutro Abs 9.7 (*)     All other components within normal limits  BASIC METABOLIC PANEL - Abnormal; Notable for the following:    BUN 5 (*)     All other components within normal limits  D-DIMER, QUANTITATIVE - Abnormal; Notable for the following:    D-Dimer, Quant 1.72 (*)     All other components within normal limits   Dg Chest 2 View  07/21/2012  *RADIOLOGY REPORT*  Clinical Data: Cough, chest pain, pleurisy  CHEST - 2 VIEW  Comparison: 11/01/2009; 06/15/2008; 09/10/2004; thoracic spine MRI - 04/04/2011  Findings: Grossly unchanged cardiac silhouette and mediastinal contours.  Interval worsening of left mid and right suprahilar linear heterogeneous opacities.  Interval worsening of right middle lobe heterogeneous airspace opacities.  The lungs remain hyperexpanded.  No pleural effusion or pneumothorax. Grossly unchanged bones, including mild endplate deformity of the T8 vertebral body as demonstrated on prior thoracic spine MRI.  IMPRESSION: Interval worsening/development of bilateral heterogeneous opacities, worse within the right middle lobe, possibly atelectasis but worrisome for multifocal infection, including atypicals.  A follow-up chest radiograph in 4 to 6 weeks after treatment is recommended to ensure resolution.   Original Report Authenticated By: Waynard Reeds, M.D.    Ct Angio Chest Pe W/cm &/or Wo Cm  07/21/2012  *RADIOLOGY REPORT*  Clinical Data: Left chest pain with productive cough.  Question acute pulmonary embolism. History of von Willebrand's disease.  CT ANGIOGRAPHY CHEST  Technique:  Multidetector CT imaging of the chest using the standard protocol during bolus administration of intravenous contrast. Multiplanar reconstructed  images including MIPs were obtained and reviewed to evaluate the vascular anatomy.  Contrast: OMNIPAQUE IOHEXOL 350 MG/ML SOLN  Comparison: Chest radiographs 07/21/2012 and 11/01/2009.  Findings: The pulmonary arteries are well opacified with contrast. There is no evidence of acute pulmonary embolism.  The thoracic aorta appears normal.  There is bilateral hilar lymphadenopathy.  A right hilar node measures 10 mm on image 51.  A left hilar node measures 11 mm on image 49.  There is mildly prominent subcarinal nodal tissue.  No significant pleural or pericardial effusion is present.  There are areas of consolidation within the lingula and right middle lobe.  In addition, there are several irregular nodular densities centrally in the right upper and middle lobes. These measure 10 mm in the right upper lobe on image 41 and 18 mm in the right middle lobe on image 43.  No endobronchial lesion is seen.  The visualized upper abdomen appears unremarkable.  No splenic abnormality is seen.  Thoracic Schmorl's nodes are stable.  IMPRESSION:  1.  No evidence of acute pulmonary embolism. 2.  As demonstrated radiographically, there are areas of consolidation in the right middle lobe and lingula with nodular air space opacities more centrally in the right upper and middle lobes. Findings are suspicious for multi focal pneumonia. Associated hilar adenopathy is likely reactive.  Additional considerations would include sarcoidosis and pulmonary hemorrhage given the patient's history.  Radiographic followup after appropriate therapy recommended.   Original Report Authenticated By: Gerrianne Scale, M.D.        MDM  Nursing notes including past medical history and social history reviewed and considered in documentation xrays reviewed and considered Labs/vital reviewed and considered      Date: 07/21/2012  Rate: 97   Rhythm: normal sinus rhythm  QRS Axis: normal  Intervals: normal  ST/T Wave abnormalities:  nonspecific ST changes  Conduction Disutrbances:none  Narrative Interpretation:   Old EKG Reviewed: none available    I personally performed the services described in this documentation, which was scribed in my presence. The recorded information has been reviewed and considered.      Joya Gaskins, MD 07/21/12 2034

## 2012-07-23 ENCOUNTER — Other Ambulatory Visit (HOSPITAL_COMMUNITY): Payer: Self-pay | Admitting: Family Medicine

## 2012-07-23 ENCOUNTER — Ambulatory Visit (HOSPITAL_COMMUNITY)
Admission: RE | Admit: 2012-07-23 | Discharge: 2012-07-23 | Disposition: A | Discharge status: Home / Self Care | Payer: Self-pay | Source: Ambulatory Visit | Attending: Family Medicine | Admitting: Family Medicine

## 2012-07-23 DIAGNOSIS — J154 Pneumonia due to other streptococci: Secondary | ICD-10-CM

## 2012-09-03 ENCOUNTER — Other Ambulatory Visit (HOSPITAL_COMMUNITY)
Admission: RE | Admit: 2012-09-03 | Discharge: 2012-09-03 | Disposition: A | Discharge status: Home / Self Care | Payer: 59 | Source: Ambulatory Visit | Attending: Obstetrics and Gynecology | Admitting: Obstetrics and Gynecology

## 2012-09-03 ENCOUNTER — Other Ambulatory Visit: Payer: Self-pay | Admitting: Adult Health

## 2012-09-03 DIAGNOSIS — Z1151 Encounter for screening for human papillomavirus (HPV): Secondary | ICD-10-CM | POA: Insufficient documentation

## 2012-09-03 DIAGNOSIS — Z01419 Encounter for gynecological examination (general) (routine) without abnormal findings: Secondary | ICD-10-CM | POA: Insufficient documentation

## 2012-09-24 ENCOUNTER — Ambulatory Visit
Admission: RE | Admit: 2012-09-24 | Discharge: 2012-09-24 | Disposition: A | Discharge status: Home / Self Care | Payer: 59 | Source: Ambulatory Visit | Attending: Physician Assistant | Admitting: Physician Assistant

## 2012-09-24 ENCOUNTER — Other Ambulatory Visit: Payer: Self-pay | Admitting: Physician Assistant

## 2012-09-24 DIAGNOSIS — R9389 Abnormal findings on diagnostic imaging of other specified body structures: Secondary | ICD-10-CM

## 2012-11-13 ENCOUNTER — Encounter: Payer: 59 | Attending: Physical Medicine & Rehabilitation | Admitting: Physical Medicine & Rehabilitation

## 2013-02-05 ENCOUNTER — Telehealth: Payer: Self-pay | Admitting: Physician Assistant

## 2013-02-05 NOTE — Telephone Encounter (Signed)
Need approval for controlled medication.  Last office noted suggest decreasing Tramadol as much as poss.  Last RF in chart was 12/28/12 #60 + 1 refill.  Fax from Pharmacy states last fill 30/02/2013. Tramadol 50mg  QID

## 2013-02-05 NOTE — Telephone Encounter (Signed)
Notify patient it is too soon, tramadol can be refilled on 02/19/13 and for 60 to last 1 month (i.e. BID)

## 2013-02-05 NOTE — Telephone Encounter (Signed)
Pt states that you had discussed doing that but her bottle said qid so she did not know that you wanted her to start doing bid. She will be glad to do bid but will need a refill as she only has 2 days worth of tramadol left. She said she was sorry she must have misunderstood when you were going to start that. Also she said you had discussed doing that with the Diazepam as well and when she got her refill it was the same as before. If you want to decreased her diazepam she will need to get a lower qty next refill.

## 2013-02-05 NOTE — Telephone Encounter (Signed)
Med c/o, pt aware

## 2013-02-05 NOTE — Telephone Encounter (Signed)
Refill tramadol 50 mg pobid #60 now

## 2013-02-09 ENCOUNTER — Telehealth: Payer: Self-pay | Admitting: Family Medicine

## 2013-02-09 NOTE — Telephone Encounter (Signed)
Pt called and appt scheduled for this Thursday with Dr. Tanya Nones.

## 2013-02-09 NOTE — Telephone Encounter (Signed)
Having small seizure activity.  Episodes were loosing LOC.  Episodes of blank staring not responding to voices (family calling at her).  Do not last long.  Does it have anything to do with the medication adjustments.  When does come around does not remember where she is or what she is doing!!  Currently  Lamictal 100mg  BID.   Concerned never had episodes like this before.

## 2013-02-09 NOTE — Telephone Encounter (Signed)
Ntbs,

## 2013-02-10 ENCOUNTER — Encounter: Payer: Self-pay | Admitting: Family Medicine

## 2013-02-10 DIAGNOSIS — M797 Fibromyalgia: Secondary | ICD-10-CM | POA: Insufficient documentation

## 2013-02-10 DIAGNOSIS — F445 Conversion disorder with seizures or convulsions: Secondary | ICD-10-CM | POA: Insufficient documentation

## 2013-02-10 DIAGNOSIS — M5137 Other intervertebral disc degeneration, lumbosacral region: Secondary | ICD-10-CM

## 2013-02-10 DIAGNOSIS — D680 Von Willebrand's disease: Secondary | ICD-10-CM

## 2013-02-10 DIAGNOSIS — F450 Somatization disorder: Secondary | ICD-10-CM | POA: Insufficient documentation

## 2013-02-11 ENCOUNTER — Ambulatory Visit (INDEPENDENT_AMBULATORY_CARE_PROVIDER_SITE_OTHER): Payer: 59 | Admitting: Family Medicine

## 2013-02-11 ENCOUNTER — Encounter: Payer: Self-pay | Admitting: Family Medicine

## 2013-02-11 VITALS — BP 110/68 | HR 88 | Temp 98.5°F | Resp 16 | Wt 157.0 lb

## 2013-02-11 DIAGNOSIS — R55 Syncope and collapse: Secondary | ICD-10-CM

## 2013-02-11 LAB — HEPATIC FUNCTION PANEL
ALT: 15 U/L (ref 0–35)
AST: 14 U/L (ref 0–37)
Alkaline Phosphatase: 60 U/L (ref 39–117)
Bilirubin, Direct: 0.1 mg/dL (ref 0.0–0.3)
Indirect Bilirubin: 0.1 mg/dL (ref 0.0–0.9)

## 2013-02-11 LAB — CBC WITH DIFFERENTIAL/PLATELET
Basophils Absolute: 0 10*3/uL (ref 0.0–0.1)
Basophils Relative: 0 % (ref 0–1)
Eosinophils Absolute: 0.2 10*3/uL (ref 0.0–0.7)
Eosinophils Relative: 2 % (ref 0–5)
MCH: 29.8 pg (ref 26.0–34.0)
MCV: 87.9 fL (ref 78.0–100.0)
Neutrophils Relative %: 57 % (ref 43–77)
Platelets: 363 10*3/uL (ref 150–400)
RDW: 13.6 % (ref 11.5–15.5)

## 2013-02-11 LAB — BASIC METABOLIC PANEL
BUN: 11 mg/dL (ref 6–23)
Chloride: 105 mEq/L (ref 96–112)
Creat: 0.8 mg/dL (ref 0.50–1.10)
Glucose, Bld: 109 mg/dL — ABNORMAL HIGH (ref 70–99)
Potassium: 4.6 mEq/L (ref 3.5–5.3)

## 2013-02-11 NOTE — Progress Notes (Signed)
Subjective:     Patient ID: Theresa Washington, female   DOB: 1981-07-29, 32 y.o.   MRN: [id number]  HPI  32 year old female here today for followup of a recent syncopal episode.  She was riding with her 22 year old son in a car. She reached out to get her purse to give him some money to go for a drive through. She instantly blacked out. She states her son says she was unresponsive for about 1 minute. She remained sitting in the chair with her head slumped over in her lap. She then gradually resumed consciousness. She had a prolonged period after resuming consciousness of confusion and decreased mental acuity that lasted 15-20 minutes.  There was no witnessed seizure activity. There was no bowel or bladder incontinence. She did not bite her tongue. She denies any chest pain shortness of breath dyspnea on exertion or chest palpitations. She states that approximately 10 years ago she had a very thorough cardiac workup done at lower presyncopal episodes. Per the patient this included a stress test, echocardiogram, one-month event monitor, and a tilt table test. She states that the results of these studies but were inconclusive. She also has a history of pseudoseizures. As diagnosed by Dr. Terrace Arabia after a 72 hour video observed EEG session. She currently takes Lamictal for this condition. She states that she has been feeling fine ever since this episode one week ago.  Review of Systems Review of systems is negative except for that dictated in history of present illness Past Medical History  Diagnosis Date  . Von Willebrand disease   . Seizures   . Bell's palsy   . Back pain   . Kidney stone   . Degenerative disc disease   . DDD (degenerative disc disease)   . Spondylosis   . Fibromyalgia   . DDD (degenerative disc disease)   . Ankylosing spondylitis   . Fibromyalgia   . PVC (premature ventricular contraction)   . Tachycardia   . Anemia     history of  . Anxiety     stress   Current Outpatient  Prescriptions on File Prior to Visit  Medication Sig Dispense Refill  . clobetasol ointment (TEMOVATE) 0.05 % Apply 1 application topically at bedtime.      . cyclobenzaprine (FLEXERIL) 10 MG tablet Take 10 mg by mouth 3 (three) times daily as needed. For muscle spasms      . diazepam (VALIUM) 5 MG tablet Take 5 mg by mouth every 12 (twelve) hours as needed for anxiety. For muscle spasms      . diclofenac sodium (VOLTAREN) 1 % GEL Apply 1 application topically daily as needed. For pain       . esomeprazole (NEXIUM) 40 MG capsule Take 40 mg by mouth daily before breakfast.      . etonogestrel-ethinyl estradiol (NUVARING) 0.12-0.015 MG/24HR vaginal ring Place 1 each vaginally every 28 (twenty-eight) days. Insert vaginally and leave in place for 3 consecutive weeks, then remove for 1 week.       . lamoTRIgine (LAMICTAL) 100 MG tablet Take 100 mg by mouth 2 (two) times daily. For seizures      . methylcellulose (ARTIFICIAL TEARS) 1 % ophthalmic solution Place 1 drop into both eyes daily as needed. Dry Eyes      . Milnacipran (SAVELLA) 50 MG TABS Take 50 mg by mouth 2 (two) times daily.      . pregabalin (LYRICA) 75 MG capsule Take 75 mg by mouth 2 (two) times daily.      [name]  traMADol (ULTRAM) 50 MG tablet Take 50 mg by mouth 2 (two) times daily as needed (starting to cut back  start BID dose today).        No current facility-administered medications on file prior to visit.   History   Social History  . Marital Status: Married    Spouse Name: N/A    Number of Children: N/A  . Years of Education: N/A   Occupational History  . Not on file.   Social History Main Topics  . Smoking status: Current Every Day Smoker -- 1.00 packs/day for 12 years    Types: Cigarettes  . Smokeless tobacco: Never Used  . Alcohol Use: No  . Drug Use: No  . Sexually Active: Yes    Birth Control/ Protection: Inserts   Other Topics Concern  . Not on file   Social History Narrative  . No narrative on file    Family History  Problem Relation Age of Onset  . Heart disease Father   . Kidney disease Father   . Alcohol abuse Brother   . Mental retardation Brother   . Cancer Maternal Grandmother     colon       Objective:   Physical Exam  Constitutional: She is oriented to person, place, and time. She appears well-developed and well-nourished. No distress.  HENT:  Head: Normocephalic and atraumatic.  Right Ear: External ear normal.  Left Ear: External ear normal.  Nose: Nose normal.  Mouth/Throat: Oropharynx is clear and moist.  Eyes: Conjunctivae and EOM are normal. Pupils are equal, round, and reactive to light.  Neck: Normal range of motion. Neck supple. No JVD present. No thyromegaly present.  Cardiovascular: Normal rate, regular rhythm and normal heart sounds.  Exam reveals no gallop and no friction rub.   No murmur heard. Pulmonary/Chest: Effort normal and breath sounds normal. No respiratory distress. She has no wheezes. She has no rales. She exhibits no tenderness.  Abdominal: Soft. Bowel sounds are normal. She exhibits no distension. There is no tenderness. There is no rebound and no guarding.  Musculoskeletal: Normal range of motion. She exhibits no edema.  Lymphadenopathy:    She has no cervical adenopathy.  Neurological: She is alert and oriented to person, place, and time. She has normal reflexes. She displays normal reflexes. No cranial nerve deficit. She exhibits normal muscle tone. Coordination normal.  Skin: Skin is warm and dry. She is not diaphoretic.  Psychiatric: She has a normal mood and affect. Her behavior is normal. Judgment and thought content normal.       Assessment:     Syncope-differential includes pseudoseizure versus cardiac arrhythmia.    Plan:     1. Syncope and collapse EKG in the office today reveals normal sinus rhythm at 77 beats per minute with normal intervals and normal axis without evidence of Wolff-Parkinson-White, long QT syndrome, or  brugada syndrome. Will get basic laboratory studies to rule out electrolyte imbalances. We'll then schedule the patient for a one-month event monitor to evaluate for cardiac arrhythmias. If this workup is negative, we'll diagnose the patient with a recurrent pseudoseizure. - EKG 12-Lead - Basic Metabolic Panel - CBC with Differential - Hepatic Function Panel - Cardiac event monitor; Future

## 2013-02-16 ENCOUNTER — Telehealth: Payer: Self-pay | Admitting: Family Medicine

## 2013-02-16 NOTE — Telephone Encounter (Signed)
I would recommend going for a brisk walk or jogging when she feels anxious to try to burn off the nervous energy.

## 2013-02-17 ENCOUNTER — Ambulatory Visit: Payer: 59 | Admitting: Physician Assistant

## 2013-02-17 NOTE — Telephone Encounter (Signed)
Pt aware.

## 2013-02-18 ENCOUNTER — Ambulatory Visit (INDEPENDENT_AMBULATORY_CARE_PROVIDER_SITE_OTHER): Payer: 59

## 2013-02-18 DIAGNOSIS — R55 Syncope and collapse: Secondary | ICD-10-CM

## 2013-02-18 NOTE — Progress Notes (Signed)
Placed a 30 day event monitor on patient and went over instructions on how to use it and when to return it 

## 2013-03-02 ENCOUNTER — Telehealth: Payer: Self-pay | Admitting: Family Medicine

## 2013-03-02 MED ORDER — DIAZEPAM 5 MG PO TABS: 5.0000 mg | ORAL_TABLET | Freq: Two times a day (BID) | ORAL | Status: DC | PRN

## 2013-03-02 MED ORDER — TRAMADOL HCL 50 MG PO TABS: 50.0000 mg | ORAL_TABLET | Freq: Two times a day (BID) | ORAL | Status: DC | PRN

## 2013-03-02 NOTE — Telephone Encounter (Signed)
Ok to refill both?? 

## 2013-03-02 NOTE — Telephone Encounter (Signed)
?   OK to Refill  

## 2013-03-02 NOTE — Telephone Encounter (Signed)
Rx Refilled  

## 2013-03-09 ENCOUNTER — Telehealth: Payer: Self-pay | Admitting: Family Medicine

## 2013-03-09 MED ORDER — PREGABALIN 75 MG PO CAPS: 75.0000 mg | ORAL_CAPSULE | Freq: Two times a day (BID) | ORAL | Status: DC

## 2013-03-09 NOTE — Telephone Encounter (Signed)
Last refill 01/07/13  Last ov 02/11/13 Lyrica 75mg  BID # 60 Need approval for controlled medication.

## 2013-03-09 NOTE — Telephone Encounter (Signed)
Ok to refill with 5 refills 

## 2013-03-09 NOTE — Telephone Encounter (Signed)
rx called in

## 2013-03-09 NOTE — Telephone Encounter (Signed)
lyrica 75 mg take 1 capsule by mouth twice daily last rf03/21/2014

## 2013-03-10 ENCOUNTER — Telehealth: Payer: Self-pay | Admitting: Family Medicine

## 2013-03-10 ENCOUNTER — Encounter: Payer: Self-pay | Admitting: Physician Assistant

## 2013-03-10 ENCOUNTER — Ambulatory Visit (INDEPENDENT_AMBULATORY_CARE_PROVIDER_SITE_OTHER): Payer: 59 | Admitting: Physician Assistant

## 2013-03-10 VITALS — BP 140/88 | HR 82 | Temp 98.1°F | Resp 16 | Wt 160.0 lb

## 2013-03-10 DIAGNOSIS — M542 Cervicalgia: Secondary | ICD-10-CM

## 2013-03-11 ENCOUNTER — Encounter: Payer: Self-pay | Admitting: Family Medicine

## 2013-03-11 NOTE — Progress Notes (Signed)
Patient ID: Theresa Washington MRN: [id number], DOB: Mar 16, 1981, 32 y.o. Date of Encounter: 03/11/2013, 8:03 AM    Chief Complaint:  Chief Complaint  Patient presents with  . Neck and Shoulder pain     HPI: 32 y.o. year old female presents with c/o neck pain. Says she went to "spine specialist" on 4/3 and had 4 injections (2 on each side of neck). Over this past weekend she called them b/c of continued pain and they called in prednisone 10mg  tabs-took two per day for 2 days-per pt.  She came in today b/c still has pain in neck.  No pain, numbness, tingling in arms or hands. Has had CT, MRI, XRays. Is taking all meds as below-Takes the Flexeril TID. Takes the Valium BID. In past was taking Tramadol QID-At OV 01/07/13 Dr. Tanya Nones had her decrease this to BID and this is what she is taking. Takes Savella and Lyrica as below. Says she saw a "NeuroPsychologist" 05/24/12 regarding stress and anxiety. Says they determined she is a very anxious person, but she does not mention any intervention/treatment regarding this aspect of the problem.     Home Meds: Current Outpatient Prescriptions on File Prior to Visit  Medication Sig Dispense Refill  . clobetasol ointment (TEMOVATE) 0.05 % Apply 1 application topically at bedtime.      . cyclobenzaprine (FLEXERIL) 10 MG tablet Take 10 mg by mouth 3 (three) times daily as needed. For muscle spasms      . diazepam (VALIUM) 5 MG tablet Take 1 tablet (5 mg total) by mouth every 12 (twelve) hours as needed for anxiety. For muscle spasms  60 tablet  0  . diclofenac sodium (VOLTAREN) 1 % GEL Apply 1 application topically daily as needed. For pain       . esomeprazole (NEXIUM) 40 MG capsule Take 40 mg by mouth daily before breakfast.      . etonogestrel-ethinyl estradiol (NUVARING) 0.12-0.015 MG/24HR vaginal ring Place 1 each vaginally every 28 (twenty-eight) days. Insert vaginally and leave in place for 3 consecutive weeks, then remove for 1 week.       .  lamoTRIgine (LAMICTAL) 100 MG tablet Take 100 mg by mouth 2 (two) times daily. For seizures      . methylcellulose (ARTIFICIAL TEARS) 1 % ophthalmic solution Place 1 drop into both eyes daily as needed. Dry Eyes      . Milnacipran (SAVELLA) 50 MG TABS Take 50 mg by mouth 2 (two) times daily.      . pregabalin (LYRICA) 75 MG capsule Take 1 capsule (75 mg total) by mouth 2 (two) times daily.  30 capsule  5  . traMADol (ULTRAM) 50 MG tablet Take 1 tablet (50 mg total) by mouth 2 (two) times daily as needed (starting to cut back  start BID dose today).  60 tablet  0   No current facility-administered medications on file prior to visit.    Allergies:  Allergies  Allergen Reactions  . Shrimp (Shellfish Allergy) Anaphylaxis, Hives and Swelling  . Other     Pt states "I carry an Epipen. I had anaphylactic reaction to something but I don't know what".       Review of Systems: Pertinent ROS in HPI. Others negative.   Physical Exam: Blood pressure 140/88, pulse 82, temperature 98.1 F (36.7 C), temperature source Oral, resp. rate 16, weight 160 lb (72.576 kg)., Body mass index is 28.35 kg/(m^2). General: Well developed, well nourished,WF in no acute distress. Neck: She has  good ROM when I had her turn head to each side and had her tilt ear to shoulder on each side. Palpation of the area reveals the areas with the most tenderness are with palpation of the trapezius bilaterally. Lungs: Clear bilaterally to auscultation without wheezes, rales, or rhonchi. Breathing is unlabored. Heart: Regular rhythm. No murmurs, rubs, or gallops. Msk:  Strength and tone normal for age.5/5 bilateral grip strength and upper extremity strength. Extremities/Skin: Warm and dry. No clubbing or cyanosis. No edema. No rashes or suspicious lesions. Neuro: Alert and oriented X 3. Moves all extremities spontaneously. Gait is normal. CNII-XII grossly in tact. Psych:  Responds to questions appropriately with a normal affect.      ASSESSMENT AND PLAN:  32 y.o. year old female with  1. NECK PAIN I discussed with her that she is on medications that cover every mechanism of action to treat her pain that I am aware of. She is agreeable to f/u with a pain clinic. Told her she can increase Tramadol to TID in interim. May need f/u with neuro psychiatrist as well. - Ambulatory referral to Pain Clinic   Signed, Shon Hale [place], [place], [facility] 03/11/2013 8:03 AM

## 2013-03-11 NOTE — Telephone Encounter (Signed)
This encounter was created in error - please disregard.

## 2013-03-12 NOTE — Telephone Encounter (Signed)
error 

## 2013-03-17 ENCOUNTER — Encounter: Payer: Self-pay | Admitting: Family Medicine

## 2013-03-17 ENCOUNTER — Ambulatory Visit (INDEPENDENT_AMBULATORY_CARE_PROVIDER_SITE_OTHER): Payer: 59 | Admitting: Family Medicine

## 2013-03-17 VITALS — BP 130/74 | HR 94 | Temp 98.6°F | Resp 18 | Wt 161.0 lb

## 2013-03-17 DIAGNOSIS — S161XXA Strain of muscle, fascia and tendon at neck level, initial encounter: Secondary | ICD-10-CM

## 2013-03-17 DIAGNOSIS — M797 Fibromyalgia: Secondary | ICD-10-CM

## 2013-03-17 DIAGNOSIS — S139XXA Sprain of joints and ligaments of unspecified parts of neck, initial encounter: Secondary | ICD-10-CM

## 2013-03-17 DIAGNOSIS — IMO0001 Reserved for inherently not codable concepts without codable children: Secondary | ICD-10-CM

## 2013-03-17 DIAGNOSIS — M940 Chondrocostal junction syndrome [Tietze]: Secondary | ICD-10-CM

## 2013-03-17 DIAGNOSIS — B370 Candidal stomatitis: Secondary | ICD-10-CM

## 2013-03-17 MED ORDER — NYSTATIN 100000 UNIT/ML MT SUSP: 500000.0000 [IU] | Freq: Four times a day (QID) | OROMUCOSAL | Status: DC

## 2013-03-17 NOTE — Progress Notes (Signed)
Subjective:    Patient ID: Theresa Washington, female    DOB: 10/17/1981, 32 y.o.   MRN: [id number]  HPI  Patient complains of tightness and soreness in the muscles around her neck. This extends down to the superior aspect of both shoulders.  She recently underwent trigger point injections without relief. She is currently taking Flexeril 10 mg every 8 hours, tramadol, Lyrica, and sevelamer for fibromyalgia.  She denies any injury to her neck. There is no bony tenderness on exam. There are palpable swelling and spasm in the trapezius muscle.  She also has thrush in the mouth appear Past Medical History  Diagnosis Date  . Von Willebrand disease   . Seizures   . Bell's palsy   . Back pain   . Kidney stone   . Degenerative disc disease   . DDD (degenerative disc disease)   . Spondylosis   . Fibromyalgia   . DDD (degenerative disc disease)   . Ankylosing spondylitis   . Fibromyalgia   . PVC (premature ventricular contraction)   . Tachycardia   . Anemia     history of  . Anxiety     stress   Current Outpatient Prescriptions on File Prior to Visit  Medication Sig Dispense Refill  . clobetasol ointment (TEMOVATE) 0.05 % Apply 1 application topically at bedtime.      . cyclobenzaprine (FLEXERIL) 10 MG tablet Take 10 mg by mouth 3 (three) times daily as needed. For muscle spasms      . diazepam (VALIUM) 5 MG tablet Take 1 tablet (5 mg total) by mouth every 12 (twelve) hours as needed for anxiety. For muscle spasms  60 tablet  0  . diclofenac sodium (VOLTAREN) 1 % GEL Apply 1 application topically daily as needed. For pain      . esomeprazole (NEXIUM) 40 MG capsule Take 40 mg by mouth daily before breakfast.      . etonogestrel-ethinyl estradiol (NUVARING) 0.12-0.015 MG/24HR vaginal ring Place 1 each vaginally every 28 (twenty-eight) days. Insert vaginally and leave in place for 3 consecutive weeks, then remove for 1 week.       . lamoTRIgine (LAMICTAL) 100 MG tablet Take 100 mg by mouth  2 (two) times daily. For seizures      . methylcellulose (ARTIFICIAL TEARS) 1 % ophthalmic solution Place 1 drop into both eyes daily as needed. Dry Eyes      . Milnacipran (SAVELLA) 50 MG TABS Take 50 mg by mouth 2 (two) times daily.      . pregabalin (LYRICA) 75 MG capsule Take 1 capsule (75 mg total) by mouth 2 (two) times daily.  30 capsule  5   No current facility-administered medications on file prior to visit.   Allergies  Allergen Reactions  . Shrimp (Shellfish Allergy) Anaphylaxis, Hives and Swelling  . Other     Pt states "I carry an Epipen. I had anaphylactic reaction to something but I don't know what".    History   Social History  . Marital Status: Married    Spouse Name: N/A    Number of Children: N/A  . Years of Education: N/A   Occupational History  . Not on file.   Social History Main Topics  . Smoking status: Current Every Day Smoker -- 1.00 packs/day for 12 years    Types: Cigarettes  . Smokeless tobacco: Never Used  . Alcohol Use: No  . Drug Use: No  . Sexually Active: Yes    Birth Control/ Protection:  Inserts   Other Topics Concern  . Not on file   Social History Narrative  . No narrative on file     Review of Systems    review of systems is otherwise negative Objective:   Physical Exam  Cardiovascular: Normal rate and regular rhythm.   No murmur heard. Pulmonary/Chest: Effort normal and breath sounds normal. No respiratory distress. She has no wheezes.    Physical exam is significant for thrush in the mouth, tenderness to palpation along the trapezius. She has normal range of motion in the neck. There is no bony step off along the spinous processes of the cervical spine. There is no bony tenderness to palpation. She has negative Spurling sign. She has no weakness in the upper extremities.   Chest tenderness to palpation at the costochondral margin along the sternum.        Assessment & Plan:  1. Thrush Nystatin suspension 5 mL 4 times a  day for 7-10 days.  2. Costochondritis Motrin 800 mg 3 times a day for one week  3. Cervical strain, initial encounter She has had previous success using a TENS unit for this. I do not want to increase her medication due to fear of polypharmacy.  Consult PT. - Ambulatory referral to Physical Therapy  4. Fibromyalgia Discuss with the patient that did not fill increasing her medications or adding medications as to her benefit due to polypharmacy and possible medication side effects. We'll consult physical therapy.  Likely benefit from a home TENS unit.

## 2013-03-22 ENCOUNTER — Ambulatory Visit: Payer: 59 | Attending: Family Medicine

## 2013-03-22 ENCOUNTER — Telehealth: Payer: Self-pay | Admitting: Family Medicine

## 2013-03-22 DIAGNOSIS — M545 Low back pain, unspecified: Secondary | ICD-10-CM | POA: Insufficient documentation

## 2013-03-22 DIAGNOSIS — R293 Abnormal posture: Secondary | ICD-10-CM | POA: Insufficient documentation

## 2013-03-22 DIAGNOSIS — IMO0001 Reserved for inherently not codable concepts without codable children: Secondary | ICD-10-CM | POA: Insufficient documentation

## 2013-03-22 DIAGNOSIS — M25569 Pain in unspecified knee: Secondary | ICD-10-CM | POA: Insufficient documentation

## 2013-03-22 NOTE — Telephone Encounter (Signed)
Lets give pt a chance, today was the first appointment.  Let them formulate a treatment plan, try  2-3 episodes of physical therapy and see if their treatment plan will help with her chronic neck pain

## 2013-03-22 NOTE — Telephone Encounter (Signed)
Pt states that you wanted her to do pt for a tens unit and pt only did (mechanical) neck traction and did NOT do the tens unit. Pt states that this was done with another pt with no help to her neck problem. Now pt is in a lot of pain and neck is still swollen. What do you want her to do?

## 2013-03-23 ENCOUNTER — Telehealth: Payer: Self-pay | Admitting: Family Medicine

## 2013-03-23 ENCOUNTER — Ambulatory Visit: Payer: 59 | Admitting: Rehabilitation

## 2013-03-23 NOTE — Telephone Encounter (Signed)
..  Patient aware and suggest an appt since having probs with swelling and numbness after pt. Appt made.

## 2013-03-24 ENCOUNTER — Ambulatory Visit: Payer: 59 | Admitting: Physician Assistant

## 2013-03-25 ENCOUNTER — Encounter: Payer: Self-pay | Admitting: Family Medicine

## 2013-03-25 NOTE — Progress Notes (Unsigned)
Patient ID: Theresa Washington, female   DOB: Feb 18, 1981, 32 y.o.   MRN: [id number]  Pt had appointment with [facility] pain clinic Mar 25 2013 at 9am, pt is aware of appt per Pain clinic

## 2013-03-29 ENCOUNTER — Other Ambulatory Visit (HOSPITAL_COMMUNITY): Payer: Self-pay | Admitting: Anesthesiology

## 2013-03-29 ENCOUNTER — Ambulatory Visit: Payer: 59 | Admitting: Rehabilitation

## 2013-03-29 DIAGNOSIS — M5410 Radiculopathy, site unspecified: Secondary | ICD-10-CM

## 2013-03-29 DIAGNOSIS — M545 Low back pain: Secondary | ICD-10-CM

## 2013-03-29 DIAGNOSIS — M542 Cervicalgia: Secondary | ICD-10-CM

## 2013-03-31 ENCOUNTER — Encounter: Payer: Self-pay | Admitting: Physician Assistant

## 2013-03-31 ENCOUNTER — Ambulatory Visit (INDEPENDENT_AMBULATORY_CARE_PROVIDER_SITE_OTHER): Payer: 59 | Admitting: Physician Assistant

## 2013-03-31 ENCOUNTER — Ambulatory Visit: Payer: 59

## 2013-03-31 VITALS — BP 134/80 | HR 80 | Temp 98.2°F | Resp 16 | Ht 60.75 in | Wt 166.0 lb

## 2013-03-31 DIAGNOSIS — R609 Edema, unspecified: Secondary | ICD-10-CM

## 2013-03-31 DIAGNOSIS — R309 Painful micturition, unspecified: Secondary | ICD-10-CM

## 2013-03-31 DIAGNOSIS — R300 Dysuria: Secondary | ICD-10-CM

## 2013-03-31 LAB — URINALYSIS, ROUTINE W REFLEX MICROSCOPIC
Bilirubin Urine: NEGATIVE
Ketones, ur: NEGATIVE mg/dL
Protein, ur: NEGATIVE mg/dL
Specific Gravity, Urine: >1.03 — ABNORMAL HIGH (ref 1.005–1.030)
Urobilinogen, UA: 0.2 mg/dL (ref 0.0–1.0)

## 2013-03-31 LAB — URINALYSIS, MICROSCOPIC ONLY: RBC / HPF: NONE SEEN RBC/hpf (ref <3)

## 2013-03-31 MED ORDER — HYDROCHLOROTHIAZIDE 12.5 MG PO CAPS: 12.5000 mg | ORAL_CAPSULE | Freq: Every day | ORAL | Status: DC

## 2013-03-31 NOTE — Progress Notes (Signed)
Patient ID: Theresa Washington MRN: [id number], DOB: February 16, 1981, 32 y.o. Date of Encounter: 03/31/2013, 12:57 PM    Chief Complaint:  Chief Complaint  Patient presents with  . c/o feet and ankle swelling    also c/o painful urination     HPI: 32 y.o. year old female reports that swelling in both of her lower legs started yesterday. States that she is on no new medications. Has been on no prednisone. Says that she eats very low sodium diet.  Also reports dysuria. No fever, chills, back pain. Mild frequency, urgency.    Home Meds: See attached medication section for any medications that were entered at today's visit. The computer does not put those onto this list.The following list is a list of meds entered prior to today's visit.   Current Outpatient Prescriptions on File Prior to Visit  Medication Sig Dispense Refill  . clobetasol ointment (TEMOVATE) 0.05 % Apply 1 application topically at bedtime.      . cyclobenzaprine (FLEXERIL) 10 MG tablet Take 10 mg by mouth 3 (three) times daily as needed. For muscle spasms      . diazepam (VALIUM) 5 MG tablet Take 1 tablet (5 mg total) by mouth every 12 (twelve) hours as needed for anxiety. For muscle spasms  60 tablet  0  . diclofenac sodium (VOLTAREN) 1 % GEL Apply 1 application topically daily as needed. For pain      . esomeprazole (NEXIUM) 40 MG capsule Take 40 mg by mouth daily before breakfast.      . etonogestrel-ethinyl estradiol (NUVARING) 0.12-0.015 MG/24HR vaginal ring Place 1 each vaginally every 28 (twenty-eight) days. Insert vaginally and leave in place for 3 consecutive weeks, then remove for 1 week.       . lamoTRIgine (LAMICTAL) 100 MG tablet Take 100 mg by mouth 2 (two) times daily. For seizures      . methylcellulose (ARTIFICIAL TEARS) 1 % ophthalmic solution Place 1 drop into both eyes daily as needed. Dry Eyes      . Milnacipran (SAVELLA) 50 MG TABS Take 50 mg by mouth 2 (two) times daily.      . pregabalin (LYRICA) 75 MG  capsule Take 1 capsule (75 mg total) by mouth 2 (two) times daily.  30 capsule  5  . traMADol (ULTRAM) 50 MG tablet Take 50 mg by mouth 3 (three) times daily.      [name] nystatin (MYCOSTATIN) 100000 UNIT/ML suspension Take 5 mLs (500,000 Units total) by mouth 4 (four) times daily.  60 mL  0   No current facility-administered medications on file prior to visit.    Allergies:  Allergies  Allergen Reactions  . Shrimp (Shellfish Allergy) Anaphylaxis, Hives and Swelling  . Other     Pt states "I carry an Epipen. I had anaphylactic reaction to something but I don't know what".       Review of Systems: See HPI for pertinent ROS. All other ROS negative.    Physical Exam: Blood pressure 134/80, pulse 80, temperature 98.2 F (36.8 C), temperature source Oral, resp. rate 16, height 5' 0.75" (1.543 m), weight 166 lb (75.297 kg)., Body mass index is 31.63 kg/(m^2). General: WNWD WF. Wearing a TENS unit.  Appears in no acute distress. Neck: Supple. No thyromegaly. No lymphadenopathy. Lungs: Clear bilaterally to auscultation without wheezes, rales, or rhonchi. Breathing is unlabored. Heart: Regular rhythm. No murmurs, rubs, or gallops. Msk:  Strength and tone normal for age. Back: No costophrenic angle tenderness with percussion  bilaterally.  Extremities:  1+ pitting edema Bilateral feet, up 1/2 of calves bilaterally. Neuro: Alert and oriented X 3. Moves all extremities spontaneously. Gait is normal. CNII-XII grossly in tact. Psych:  Responds to questions appropriately with a normal affect.     ASSESSMENT AND PLAN:  32 y.o. year old female with  1. Edema - hydrochlorothiazide (MICROZIDE) 12.5 MG capsule; Take 1 capsule (12.5 mg total) by mouth daily.  Dispense: 30 capsule; Refill: 11 Discussed low sodium diet. Discussed canned foods, frozen dinners, processed foods as sources of high sodium.  Can take HCTZ Q AM prn swelling.  2. Painful urination/UTI Results for orders placed in visit on  03/31/13  URINALYSIS, ROUTINE W REFLEX MICROSCOPIC      Result Value Range   Color, Urine YELLOW  YELLOW   APPearance CLEAR  CLEAR   Specific Gravity, Urine >1.030 (*) 1.005 - 1.030   pH 6.0  5.0 - 8.0   Glucose, UA NEG  NEG mg/dL   Bilirubin Urine NEG  NEG   Ketones, ur NEG  NEG mg/dL   Hgb urine dipstick NEG  NEG   Protein, ur NEG  NEG mg/dL   Urobilinogen, UA 0.2  0.0 - 1.0 mg/dL   Nitrite NEG  NEG   Leukocytes, UA SMALL (*) NEG  URINALYSIS, MICROSCOPIC ONLY      Result Value Range   Squamous Epithelial / LPF FEW  RARE   Crystals NONE SEEN  NONE SEEN   Casts NONE SEEN  NONE SEEN   WBC, UA 7-10 (*) <3 WBC/hpf   RBC / HPF NONE SEEN  <3 RBC/hpf   Bacteria, UA FEW (*) RARE     - Urinalysis, Routine w reflex microscopic Rx: Nitrofurantoin SR 100mg  one PO BID x 3 days.  (FYI: I forgot to Rx this at [facility] -nurse aware to send in Rx-see result note.)  Signed, [address] [place], [place], [facility] 03/31/2013 12:57 PM

## 2013-04-01 ENCOUNTER — Telehealth: Payer: Self-pay | Admitting: Family Medicine

## 2013-04-01 DIAGNOSIS — N390 Urinary tract infection, site not specified: Secondary | ICD-10-CM

## 2013-04-01 MED ORDER — TRAMADOL HCL 50 MG PO TABS: 50.0000 mg | ORAL_TABLET | Freq: Two times a day (BID) | ORAL | Status: DC

## 2013-04-01 MED ORDER — NITROFURANTOIN MONOHYD MACRO 100 MG PO CAPS: 100.0000 mg | ORAL_CAPSULE | Freq: Two times a day (BID) | ORAL | Status: DC

## 2013-04-01 MED ORDER — DIAZEPAM 5 MG PO TABS: 5.0000 mg | ORAL_TABLET | Freq: Two times a day (BID) | ORAL | Status: DC | PRN

## 2013-04-01 NOTE — Telephone Encounter (Signed)
?   OK to Refill  

## 2013-04-01 NOTE — Telephone Encounter (Signed)
Ok to refill 

## 2013-04-01 NOTE — Telephone Encounter (Signed)
Pt called and informed of urine report and ned to treat UTI.  Rx sent to pharmacy

## 2013-04-01 NOTE — Telephone Encounter (Signed)
Message copied by Donne Anon on Thu Apr 01, 2013  4:15 PM ------      Message from: Allayne Butcher      Created: Wed Mar 31, 2013 12:53 PM       (FYI: Pt was here for OV and did UA but she never even mentioned any urine symptoms to me-she only c/o swelling in her legs to me).      Tell pt that UA shows mild UTI.       Tell her we will call in abx.            Send in Rx: Nitrofurantoin SR 100mg  one BID x 3 days. # 6/ Satira Sark ------

## 2013-04-01 NOTE — Telephone Encounter (Signed)
Rx Refilled  

## 2013-04-05 ENCOUNTER — Other Ambulatory Visit: Payer: Self-pay | Admitting: Family Medicine

## 2013-04-06 ENCOUNTER — Other Ambulatory Visit: Payer: Self-pay

## 2013-04-06 ENCOUNTER — Ambulatory Visit (HOSPITAL_COMMUNITY)
Admission: RE | Admit: 2013-04-06 | Discharge: 2013-04-06 | Disposition: A | Discharge status: Home / Self Care | Payer: 59 | Source: Ambulatory Visit | Attending: Anesthesiology | Admitting: Anesthesiology

## 2013-04-06 ENCOUNTER — Encounter (HOSPITAL_COMMUNITY): Payer: Self-pay

## 2013-04-06 DIAGNOSIS — M5124 Other intervertebral disc displacement, thoracic region: Secondary | ICD-10-CM | POA: Insufficient documentation

## 2013-04-06 DIAGNOSIS — M545 Low back pain, unspecified: Secondary | ICD-10-CM | POA: Insufficient documentation

## 2013-04-06 DIAGNOSIS — R209 Unspecified disturbances of skin sensation: Secondary | ICD-10-CM | POA: Insufficient documentation

## 2013-04-06 DIAGNOSIS — M542 Cervicalgia: Secondary | ICD-10-CM

## 2013-04-06 MED ORDER — LAMOTRIGINE 100 MG PO TABS: 100.0000 mg | ORAL_TABLET | Freq: Two times a day (BID) | ORAL | Status: DC

## 2013-04-07 ENCOUNTER — Telehealth: Payer: Self-pay | Admitting: Family Medicine

## 2013-04-09 ENCOUNTER — Telehealth: Payer: Self-pay | Admitting: Family Medicine

## 2013-04-09 DIAGNOSIS — R609 Edema, unspecified: Secondary | ICD-10-CM

## 2013-04-09 MED ORDER — POTASSIUM CHLORIDE ER 10 MEQ PO TBCR: 10.0000 meq | EXTENDED_RELEASE_TABLET | Freq: Every day | ORAL | Status: DC

## 2013-04-09 MED ORDER — HYDROCHLOROTHIAZIDE 25 MG PO TABS: 25.0000 mg | ORAL_TABLET | Freq: Every day | ORAL | Status: DC

## 2013-04-09 NOTE — Telephone Encounter (Signed)
Pt called back and said that she found out the results.

## 2013-04-09 NOTE — Telephone Encounter (Signed)
Can increase HCTZ to 25 mg one po Q AM.  Add KDur 10 meq Q AM.  Get lab work in 1-2 weeks. Send in RX for # 30 of each with 2 refills. Place order for BMET future.

## 2013-04-14 ENCOUNTER — Telehealth: Payer: Self-pay | Admitting: Family Medicine

## 2013-04-14 MED ORDER — NEBIVOLOL HCL 5 MG PO TABS: 5.0000 mg | ORAL_TABLET | Freq: Every day | ORAL | Status: DC

## 2013-04-14 NOTE — Telephone Encounter (Signed)
Pt is requesting refill of this medication and I do not see where we have given her this. I thought maybe you had given her samples but did not see it in chart.

## 2013-04-14 NOTE — Telephone Encounter (Signed)
Verified by telephone call to pt...she is on Bystolic, it was given to her on 10/20/12 for increased heart rate..medication refilled

## 2013-04-22 ENCOUNTER — Telehealth (HOSPITAL_COMMUNITY): Payer: Self-pay

## 2013-04-22 ENCOUNTER — Ambulatory Visit (HOSPITAL_COMMUNITY): Payer: 59 | Admitting: Physical Therapy

## 2013-04-26 ENCOUNTER — Other Ambulatory Visit (INDEPENDENT_AMBULATORY_CARE_PROVIDER_SITE_OTHER): Payer: 59

## 2013-04-26 DIAGNOSIS — R609 Edema, unspecified: Secondary | ICD-10-CM

## 2013-04-27 LAB — BASIC METABOLIC PANEL
CO2: 29 mEq/L (ref 19–32)
Calcium: 9.2 mg/dL (ref 8.4–10.5)
Chloride: 102 mEq/L (ref 96–112)
Creat: 0.7 mg/dL (ref 0.50–1.10)
Sodium: 139 mEq/L (ref 135–145)

## 2013-04-28 ENCOUNTER — Telehealth: Payer: Self-pay | Admitting: Family Medicine

## 2013-04-28 NOTE — Telephone Encounter (Signed)
Message copied by Donne Anon on Wed Apr 28, 2013  9:39 AM ------      Message from: Allayne Butcher      Created: Tue Apr 27, 2013  5:36 AM       Lab is normal (BMET) ------

## 2013-05-03 ENCOUNTER — Telehealth: Payer: Self-pay | Admitting: Family Medicine

## 2013-05-03 MED ORDER — TRAMADOL HCL 50 MG PO TABS: 50.0000 mg | ORAL_TABLET | Freq: Two times a day (BID) | ORAL | Status: DC

## 2013-05-03 MED ORDER — DIAZEPAM 5 MG PO TABS: 5.0000 mg | ORAL_TABLET | Freq: Two times a day (BID) | ORAL | Status: DC | PRN

## 2013-05-03 NOTE — Telephone Encounter (Signed)
?   OK to Refill  

## 2013-05-03 NOTE — Telephone Encounter (Signed)
Ok to refill 

## 2013-05-03 NOTE — Telephone Encounter (Signed)
Rx Refilled  

## 2013-05-12 NOTE — Telephone Encounter (Signed)
Pt came in for appt. 

## 2013-05-19 ENCOUNTER — Telehealth: Payer: Self-pay | Admitting: Family Medicine

## 2013-05-19 MED ORDER — MILNACIPRAN HCL 50 MG PO TABS: 50.0000 mg | ORAL_TABLET | Freq: Two times a day (BID) | ORAL | Status: DC

## 2013-05-19 NOTE — Telephone Encounter (Signed)
Rx Refilled  

## 2013-05-25 NOTE — Telephone Encounter (Signed)
Pt had questions about a Nerve conduction test that were done by another MD. We did received report and Dr. Tanya Nones reviewed and would like for the patient to follow up with the MD that did the testing and if she has any additional questions we will be glad to see her in office.Theresa KitchenMarland KitchenPatient aware and has an appt with other MD tomorrow and if she has any further questions she will call us back.

## 2013-06-03 ENCOUNTER — Telehealth: Payer: Self-pay | Admitting: Family Medicine

## 2013-06-03 MED ORDER — DIAZEPAM 5 MG PO TABS: 5.0000 mg | ORAL_TABLET | Freq: Two times a day (BID) | ORAL | Status: DC | PRN

## 2013-06-03 NOTE — Telephone Encounter (Signed)
Ok to refill 

## 2013-06-03 NOTE — Telephone Encounter (Signed)
Med refilled.

## 2013-06-04 ENCOUNTER — Telehealth: Payer: Self-pay | Admitting: Family Medicine

## 2013-06-04 MED ORDER — TRAMADOL HCL 50 MG PO TABS: 50.0000 mg | ORAL_TABLET | Freq: Two times a day (BID) | ORAL | Status: DC

## 2013-06-04 NOTE — Telephone Encounter (Signed)
Ok to refill 

## 2013-06-04 NOTE — Telephone Encounter (Signed)
Rx Refilled  

## 2013-06-04 NOTE — Telephone Encounter (Signed)
?   OK to Refill  

## 2013-06-07 ENCOUNTER — Telehealth: Payer: Self-pay | Admitting: Family Medicine

## 2013-06-07 MED ORDER — CYCLOBENZAPRINE HCL 10 MG PO TABS: 10.0000 mg | ORAL_TABLET | Freq: Three times a day (TID) | ORAL | Status: DC | PRN

## 2013-06-07 MED ORDER — TRAMADOL HCL 50 MG PO TABS: 50.0000 mg | ORAL_TABLET | Freq: Two times a day (BID) | ORAL | Status: DC

## 2013-06-07 NOTE — Telephone Encounter (Signed)
Rx Refilled  

## 2013-06-17 ENCOUNTER — Telehealth: Payer: Self-pay | Admitting: Family Medicine

## 2013-06-17 NOTE — Telephone Encounter (Signed)
Ok to refill 

## 2013-06-18 MED ORDER — CYCLOBENZAPRINE HCL 10 MG PO TABS: 10.0000 mg | ORAL_TABLET | Freq: Three times a day (TID) | ORAL | Status: DC | PRN

## 2013-06-18 NOTE — Telephone Encounter (Signed)
Rx Refilled because she get 90 and we only filled for 30 last time

## 2013-07-01 ENCOUNTER — Other Ambulatory Visit: Payer: Self-pay | Admitting: Neurology

## 2013-07-05 ENCOUNTER — Telehealth: Payer: Self-pay | Admitting: Family Medicine

## 2013-07-05 MED ORDER — DIAZEPAM 5 MG PO TABS: 5.0000 mg | ORAL_TABLET | Freq: Two times a day (BID) | ORAL | Status: DC | PRN

## 2013-07-05 MED ORDER — HYDROCHLOROTHIAZIDE 25 MG PO TABS: 25.0000 mg | ORAL_TABLET | Freq: Every day | ORAL | Status: DC

## 2013-07-05 NOTE — Telephone Encounter (Signed)
Rx Refilled  

## 2013-07-05 NOTE — Telephone Encounter (Signed)
?   OK to Refill  

## 2013-07-05 NOTE — Telephone Encounter (Signed)
Diazepam 5 mg tab 1 BID #60 last rf 06/03/13 HCTZ 25 mg tab 1 QD #30

## 2013-07-05 NOTE — Telephone Encounter (Signed)
ok 

## 2013-07-07 ENCOUNTER — Telehealth: Payer: Self-pay | Admitting: Family Medicine

## 2013-07-07 NOTE — Telephone Encounter (Signed)
?   OK to Refill  

## 2013-07-07 NOTE — Telephone Encounter (Signed)
Tramadol 50 mg 1 BID #60

## 2013-07-08 ENCOUNTER — Other Ambulatory Visit: Payer: Self-pay | Admitting: Family Medicine

## 2013-07-08 MED ORDER — TRAMADOL HCL 50 MG PO TABS: 50.0000 mg | ORAL_TABLET | Freq: Two times a day (BID) | ORAL | Status: DC

## 2013-07-08 NOTE — Telephone Encounter (Signed)
ok 

## 2013-07-08 NOTE — Telephone Encounter (Signed)
Rx Refilled  

## 2013-07-15 ENCOUNTER — Telehealth: Payer: Self-pay | Admitting: Family Medicine

## 2013-07-15 MED ORDER — CYCLOBENZAPRINE HCL 10 MG PO TABS: 10.0000 mg | ORAL_TABLET | Freq: Three times a day (TID) | ORAL | Status: DC | PRN

## 2013-07-15 NOTE — Telephone Encounter (Signed)
ok 

## 2013-07-15 NOTE — Telephone Encounter (Signed)
Rx Refilled  

## 2013-07-15 NOTE — Telephone Encounter (Signed)
Cyclobenzaprine 10 mg tab 1 TID prn muscle spasm #90 last rf 06/18/13

## 2013-07-15 NOTE — Telephone Encounter (Signed)
?   OK to Refill  

## 2013-07-26 ENCOUNTER — Telehealth: Payer: Self-pay | Admitting: Family Medicine

## 2013-07-26 NOTE — Telephone Encounter (Signed)
Pt called states she has lost feeling in her left arm and she has 2 areas of erythema and swelling, with warmth present for past couple of days. Denies fever or injury. Advised she needs to be evaluated in ER, possible cellulitis or abscess, or possible DVT, neuropathy. Has been using ICE and taking OTC meds with little change. She asked if imaging could be ordered ahead of time so she wouldn't have ot wait in ER. Advised her this can not be done she has to be triaged and evaluated by the clinician in the ER. Advised again needs to go to ER. Voiced understanding

## 2013-08-05 ENCOUNTER — Telehealth: Payer: Self-pay | Admitting: Family Medicine

## 2013-08-05 MED ORDER — DIAZEPAM 5 MG PO TABS: 5.0000 mg | ORAL_TABLET | Freq: Two times a day (BID) | ORAL | Status: DC | PRN

## 2013-08-05 MED ORDER — TRAMADOL HCL 50 MG PO TABS: 50.0000 mg | ORAL_TABLET | Freq: Two times a day (BID) | ORAL | Status: DC

## 2013-08-05 NOTE — Telephone Encounter (Signed)
?   OK to Refill  

## 2013-08-05 NOTE — Telephone Encounter (Signed)
ok 

## 2013-08-05 NOTE — Telephone Encounter (Signed)
Tramadol HCL 50 mg tab 1 BID prn last rf 07/08/13 Dizaepam 5 mg tab 1 BID last rf 07/05/13

## 2013-08-05 NOTE — Telephone Encounter (Signed)
Rx Refilled  

## 2013-08-09 ENCOUNTER — Ambulatory Visit: Payer: 59 | Admitting: Neurology

## 2013-08-09 ENCOUNTER — Encounter: Payer: Self-pay | Admitting: Neurology

## 2013-08-30 ENCOUNTER — Telehealth: Payer: Self-pay | Admitting: Family Medicine

## 2013-08-30 MED ORDER — NEBIVOLOL HCL 5 MG PO TABS: 5.0000 mg | ORAL_TABLET | Freq: Every day | ORAL | Status: DC

## 2013-08-30 MED ORDER — CYCLOBENZAPRINE HCL 10 MG PO TABS: 10.0000 mg | ORAL_TABLET | Freq: Three times a day (TID) | ORAL | Status: DC | PRN

## 2013-08-30 NOTE — Telephone Encounter (Signed)
Rx Refilled  

## 2013-08-30 NOTE — Telephone Encounter (Signed)
Ok to refill Flexeril? 

## 2013-08-30 NOTE — Telephone Encounter (Signed)
ok 

## 2013-09-06 ENCOUNTER — Telehealth: Payer: Self-pay | Admitting: Family Medicine

## 2013-09-06 MED ORDER — DIAZEPAM 5 MG PO TABS: 5.0000 mg | ORAL_TABLET | Freq: Two times a day (BID) | ORAL | Status: DC | PRN

## 2013-09-06 NOTE — Telephone Encounter (Signed)
ok 

## 2013-09-06 NOTE — Telephone Encounter (Signed)
rx called in

## 2013-09-06 NOTE — Telephone Encounter (Signed)
Diazepam 5 mg  Q12 hrs PRN  Last refill 08/05/13  Last OV 03/17/13   OK refill?

## 2013-09-07 ENCOUNTER — Telehealth: Payer: Self-pay | Admitting: Family Medicine

## 2013-09-07 NOTE — Telephone Encounter (Signed)
Request for Valium.  Was called in yesterday.  Verified by pharmacy

## 2013-09-13 ENCOUNTER — Telehealth: Payer: Self-pay | Admitting: Family Medicine

## 2013-09-13 NOTE — Telephone Encounter (Signed)
Tramadol 50 mg  BID PRN  Last RF 08/05/13  #30  OK refill?

## 2013-09-14 MED ORDER — TRAMADOL HCL 50 MG PO TABS: 50.0000 mg | ORAL_TABLET | Freq: Two times a day (BID) | ORAL | Status: DC

## 2013-09-14 NOTE — Telephone Encounter (Signed)
Rx called in 

## 2013-09-14 NOTE — Telephone Encounter (Signed)
ok 

## 2013-09-21 ENCOUNTER — Encounter: Payer: Self-pay | Admitting: Family Medicine

## 2013-09-21 ENCOUNTER — Ambulatory Visit (INDEPENDENT_AMBULATORY_CARE_PROVIDER_SITE_OTHER): Payer: 59 | Admitting: Family Medicine

## 2013-09-21 VITALS — BP 120/78 | HR 68 | Temp 98.2°F | Resp 18 | Ht 63.0 in | Wt 164.0 lb

## 2013-09-21 DIAGNOSIS — R21 Rash and other nonspecific skin eruption: Secondary | ICD-10-CM

## 2013-09-21 NOTE — Progress Notes (Signed)
Subjective:    Patient ID: Theresa Washington, female    DOB: 1981-04-29, 32 y.o.   MRN: [id number]  HPI Patient is a 32 year old white female who has a history of fibromyalgia and somatizations disorder. She presents today complaining of a transient butterfly rash on the cheeks and nasal bridge.  It comes and goes. Sometimes it can last weeks sometimes it only lasts a few hours. It is not itchy. She has a picture today which appears relatively benign.  There is some mild erythema in both cheeks and what appears to be dry skin.  He did not appear to be characteristic mouth rash associated with lupus. Furthermore she denies any fevers, pleurisy, symptoms of pericarditis, abdominal pain, lymphadenopathy. However, she continues to have her diffuse widespread myofascial pain. Past Medical History  Diagnosis Date  . Von Willebrand disease   . Seizures   . Bell's palsy   . Back pain   . Kidney stone   . Degenerative disc disease   . DDD (degenerative disc disease)   . Spondylosis   . Fibromyalgia   . DDD (degenerative disc disease)   . Ankylosing spondylitis   . Fibromyalgia   . PVC (premature ventricular contraction)   . Tachycardia   . Anemia     history of  . Anxiety     stress   Current Outpatient Prescriptions on File Prior to Visit  Medication Sig Dispense Refill  . clobetasol ointment (TEMOVATE) 0.05 % Apply 1 application topically at bedtime.      . cyclobenzaprine (FLEXERIL) 10 MG tablet Take 1 tablet (10 mg total) by mouth 3 (three) times daily as needed. For muscle spasms  90 tablet  0  . diazepam (VALIUM) 5 MG tablet Take 1 tablet (5 mg total) by mouth every 12 (twelve) hours as needed for anxiety. For muscle spasms  60 tablet  0  . diclofenac sodium (VOLTAREN) 1 % GEL Apply 1 application topically daily as needed. For pain      . esomeprazole (NEXIUM) 40 MG capsule Take 40 mg by mouth daily before breakfast.      . etonogestrel-ethinyl estradiol (NUVARING) 0.12-0.015 MG/24HR  vaginal ring Place 1 each vaginally every 28 (twenty-eight) days. Insert vaginally and leave in place for 3 consecutive weeks, then remove for 1 week.       . hydrochlorothiazide (HYDRODIURIL) 25 MG tablet Take 1 tablet (25 mg total) by mouth daily.  30 tablet  5  . lamoTRIgine (LAMICTAL) 100 MG tablet take 1 tablet by mouth twice a day  60 tablet  0  . methylcellulose (ARTIFICIAL TEARS) 1 % ophthalmic solution Place 1 drop into both eyes daily as needed. Dry Eyes      . Milnacipran (SAVELLA) 50 MG TABS Take 1 tablet (50 mg total) by mouth 2 (two) times daily.  60 tablet  5  . nebivolol (BYSTOLIC) 5 MG tablet Take 1 tablet (5 mg total) by mouth daily.  30 tablet  5  . pregabalin (LYRICA) 75 MG capsule Take 1 capsule (75 mg total) by mouth 2 (two) times daily.  30 capsule  5  . traMADol (ULTRAM) 50 MG tablet Take 1 tablet (50 mg total) by mouth 2 (two) times daily.  60 tablet  0   No current facility-administered medications on file prior to visit.   Allergies  Allergen Reactions  . Shrimp [Shellfish Allergy] Anaphylaxis, Hives and Swelling  . Other     Pt states "I carry an Epipen. I had anaphylactic  reaction to something but I don't know what".    History   Social History  . Marital Status: Married    Spouse Name: N/A    Number of Children: N/A  . Years of Education: N/A   Occupational History  . Not on file.   Social History Main Topics  . Smoking status: Current Every Day Smoker -- 1.00 packs/day for 12 years    Types: Cigarettes  . Smokeless tobacco: Never Used  . Alcohol Use: No  . Drug Use: No  . Sexual Activity: Yes    Birth Control/ Protection: Inserts   Other Topics Concern  . Not on file   Social History Narrative  . No narrative on file      Review of Systems  All other systems reviewed and are negative.       Objective:   Physical Exam  Vitals reviewed. Neck: Neck supple. No thyromegaly present.  Cardiovascular: Normal rate and regular rhythm.  Exam  reveals no gallop and no friction rub.   Murmur heard. Pulmonary/Chest: Effort normal and breath sounds normal. No respiratory distress. She has no wheezes. She has no rales. She exhibits no tenderness.  Abdominal: Soft. Bowel sounds are normal. She exhibits no distension and no mass. There is no tenderness. There is no rebound and no guarding.  Musculoskeletal: She exhibits no edema.  Lymphadenopathy:    She has no cervical adenopathy.  Skin: No rash noted. No erythema. No pallor.   patient has a 1+ systolic ejection murmur heard best left lower sternal border. This is chronic. There is no mouth or rash today. She has no axillary or cervical lymphadenopathy. She has no hepatosplenomegaly.        Assessment & Plan:  1. Malar rash I will check an ANA however the patient does not have clinical criteria for lupus. There is no widespread multisystem organ involvement. The rash that she has on her photograph does not appear to be the mouth rash associated with lupus. I believe she may be dealing with either mild rosacea or even an atopic dermatitis made worse by cold weather. - ANA

## 2013-09-22 ENCOUNTER — Other Ambulatory Visit: Payer: Self-pay | Admitting: Family Medicine

## 2013-09-22 LAB — ANA: Anti Nuclear Antibody(ANA): NEGATIVE

## 2013-09-22 MED ORDER — PREGABALIN 75 MG PO CAPS: 75.0000 mg | ORAL_CAPSULE | Freq: Two times a day (BID) | ORAL | Status: DC

## 2013-09-22 NOTE — Telephone Encounter (Signed)
.  Rx Refilled and faxed to pharmacy

## 2013-09-23 ENCOUNTER — Other Ambulatory Visit: Payer: Self-pay

## 2013-10-08 ENCOUNTER — Telehealth: Payer: Self-pay | Admitting: Family Medicine

## 2013-10-08 MED ORDER — DIAZEPAM 5 MG PO TABS: 5.0000 mg | ORAL_TABLET | Freq: Two times a day (BID) | ORAL | Status: DC | PRN

## 2013-10-08 NOTE — Telephone Encounter (Signed)
rx faxed to pharm

## 2013-10-08 NOTE — Telephone Encounter (Signed)
Needs her Valium.    [facility] [address]

## 2013-10-12 ENCOUNTER — Telehealth: Payer: Self-pay | Admitting: Family Medicine

## 2013-10-12 MED ORDER — CYCLOBENZAPRINE HCL 10 MG PO TABS: 10.0000 mg | ORAL_TABLET | Freq: Three times a day (TID) | ORAL | Status: DC | PRN

## 2013-10-12 NOTE — Telephone Encounter (Signed)
Needs flexeril refilled...? OK to Refill

## 2013-10-12 NOTE — Telephone Encounter (Signed)
ok 

## 2013-10-12 NOTE — Telephone Encounter (Signed)
Rx Refilled  

## 2013-10-13 ENCOUNTER — Encounter: Payer: Self-pay | Admitting: Family Medicine

## 2013-10-13 ENCOUNTER — Ambulatory Visit (INDEPENDENT_AMBULATORY_CARE_PROVIDER_SITE_OTHER): Payer: 59 | Admitting: Family Medicine

## 2013-10-13 VITALS — BP 120/80 | HR 86 | Temp 98.1°F | Resp 20 | Ht 62.0 in | Wt 165.0 lb

## 2013-10-13 DIAGNOSIS — J019 Acute sinusitis, unspecified: Secondary | ICD-10-CM

## 2013-10-13 DIAGNOSIS — J209 Acute bronchitis, unspecified: Secondary | ICD-10-CM

## 2013-10-13 DIAGNOSIS — F17200 Nicotine dependence, unspecified, uncomplicated: Secondary | ICD-10-CM

## 2013-10-13 DIAGNOSIS — J029 Acute pharyngitis, unspecified: Secondary | ICD-10-CM

## 2013-10-13 MED ORDER — AZITHROMYCIN 250 MG PO TABS: ORAL_TABLET | ORAL | Status: DC

## 2013-10-13 MED ORDER — FLUTICASONE PROPIONATE 50 MCG/ACT NA SUSP: 2.0000 | Freq: Every day | NASAL | Status: DC

## 2013-10-13 MED ORDER — CYCLOBENZAPRINE HCL 10 MG PO TABS: 10.0000 mg | ORAL_TABLET | Freq: Three times a day (TID) | ORAL | Status: DC | PRN

## 2013-10-13 NOTE — Patient Instructions (Signed)
Take the antibiotics Use the Mucinex  Flonase as needed Work on the smoking! F/U with Dr. Tanya Nones as needed

## 2013-10-13 NOTE — Progress Notes (Signed)
   Subjective:    Patient ID: Theresa Washington, female    DOB: November 25, 1980, 32 y.o.   MRN: [id number]  HPI  Cough with production yellow mucous, occasional wheeze, sore throat for past 4 weeks. +smoker, has history of PNA and bronchitis. Sinus drainage worsened past week, + sick contacts  Review of Systems - per above  GEN- +fatigue, fever, weight loss,weakness, recent illness HEENT- denies eye drainage, change in vision,+ nasal discharge, CVS- denies chest pain, palpitations RESP- denies SOB, +cough, wheeze MSK- denies joint pain, muscle aches, injury Neuro- denies headache, dizziness, syncope, seizure activity      Objective:   Physical Exam  GEN- NAD, alert and oriented x3 HEENT- PERRL, EOMI, non injected sclera, pink conjunctiva, MMM, oropharynx mild injection, TM clear bilat no effusion, + maxillary sinus tenderness, inflammed turbinates,  Nasal drainage  Neck- Supple, no LAD CVS- RRR, no murmur RESP-CTAB EXT- No edema Pulses- Radial 2+        Assessment & Plan:

## 2013-10-15 DIAGNOSIS — J019 Acute sinusitis, unspecified: Secondary | ICD-10-CM | POA: Insufficient documentation

## 2013-10-15 NOTE — Assessment & Plan Note (Signed)
Discussed importance of tobacco cessation. 

## 2013-10-15 NOTE — Assessment & Plan Note (Addendum)
With her smoking and per chart history of COPD though she's not on any maintenance inhalers. I will give her course of antibiotics she will also use Mucinex. I think she will benefit from a pulmonary function test to see if she actually has COPD

## 2013-10-15 NOTE — Assessment & Plan Note (Signed)
Flonase, antibiotics

## 2013-10-21 ENCOUNTER — Other Ambulatory Visit: Payer: Self-pay | Admitting: *Deleted

## 2013-10-21 MED ORDER — ETONOGESTREL-ETHINYL ESTRADIOL 0.12-0.015 MG/24HR VA RING: VAGINAL_RING | VAGINAL | Status: DC

## 2013-10-25 ENCOUNTER — Telehealth: Payer: Self-pay | Admitting: Family Medicine

## 2013-10-25 NOTE — Telephone Encounter (Signed)
Refill tramadol - .? OK to Refill

## 2013-10-26 ENCOUNTER — Ambulatory Visit: Payer: 59 | Admitting: Adult Health

## 2013-10-26 MED ORDER — TRAMADOL HCL 50 MG PO TABS: 50.0000 mg | ORAL_TABLET | Freq: Two times a day (BID) | ORAL | Status: DC

## 2013-10-26 NOTE — Telephone Encounter (Signed)
rx was printed and faxed to pharmacy 

## 2013-10-26 NOTE — Telephone Encounter (Signed)
ok 

## 2013-11-19 ENCOUNTER — Telehealth: Payer: Self-pay | Admitting: Family Medicine

## 2013-11-19 NOTE — Telephone Encounter (Signed)
Request RF Diazepam 5 mg   Last RF 11/21 #60  Last OV 11/26  OK refill?

## 2013-11-22 MED ORDER — DIAZEPAM 5 MG PO TABS: 5.0000 mg | ORAL_TABLET | Freq: Two times a day (BID) | ORAL | Status: DC | PRN

## 2013-11-22 NOTE — Telephone Encounter (Signed)
Rx called in 

## 2013-11-22 NOTE — Telephone Encounter (Signed)
ok 

## 2013-11-30 ENCOUNTER — Telehealth: Payer: Self-pay | Admitting: Family Medicine

## 2013-11-30 ENCOUNTER — Encounter: Payer: Self-pay | Admitting: Family Medicine

## 2013-11-30 MED ORDER — MILNACIPRAN HCL 50 MG PO TABS: 50.0000 mg | ORAL_TABLET | Freq: Two times a day (BID) | ORAL | Status: DC

## 2013-11-30 NOTE — Telephone Encounter (Signed)
Medication refill for one time only.  Patient needs to be seen.  Letter sent for patient to call and schedule 

## 2013-12-10 ENCOUNTER — Ambulatory Visit (INDEPENDENT_AMBULATORY_CARE_PROVIDER_SITE_OTHER): Payer: 59 | Admitting: Family Medicine

## 2013-12-10 ENCOUNTER — Telehealth: Payer: Self-pay | Admitting: Family Medicine

## 2013-12-10 VITALS — BP 120/100 | HR 68 | Temp 98.1°F | Resp 18 | Ht 62.0 in | Wt 153.0 lb

## 2013-12-10 DIAGNOSIS — M25559 Pain in unspecified hip: Secondary | ICD-10-CM

## 2013-12-10 DIAGNOSIS — M76899 Other specified enthesopathies of unspecified lower limb, excluding foot: Secondary | ICD-10-CM

## 2013-12-10 DIAGNOSIS — M25519 Pain in unspecified shoulder: Secondary | ICD-10-CM

## 2013-12-10 DIAGNOSIS — M7070 Other bursitis of hip, unspecified hip: Secondary | ICD-10-CM

## 2013-12-10 MED ORDER — METHYLPREDNISOLONE ACETATE 40 MG/ML IJ SUSP
40.0000 mg | Freq: Once | INTRAMUSCULAR | Status: AC
  Administered 2013-12-10: 40 mg via INTRAMUSCULAR

## 2013-12-10 MED ORDER — PREDNISONE 10 MG PO TABS: ORAL_TABLET | ORAL | Status: DC

## 2013-12-10 NOTE — Telephone Encounter (Signed)
Having severe pain left hip and shoulder.  Hx Bursitis.  Doesn't have co-pay to come to office (no appt today)  Wants to know if you can order X-rays for her to have?  Or what else do you recommend?

## 2013-12-10 NOTE — Telephone Encounter (Signed)
ntbs next week.  Need ov for x-ray due to insurance

## 2013-12-10 NOTE — Telephone Encounter (Signed)
Called patient.  Saw cancellation appt from today and offered to pt.

## 2013-12-10 NOTE — Patient Instructions (Signed)
Start prednisone tablets tomorrow Call if not improved and referral to ortho will be made F/U as previous Dr. Dennard Schaumann

## 2013-12-12 ENCOUNTER — Encounter: Payer: Self-pay | Admitting: Family Medicine

## 2013-12-12 DIAGNOSIS — M25559 Pain in unspecified hip: Secondary | ICD-10-CM | POA: Insufficient documentation

## 2013-12-12 NOTE — Assessment & Plan Note (Signed)
No specific injury. She has no low back pain and wears a large back brace on a regular basis. He'll use of prednisone as per above and she will continue her tramadol by her pain clinic. She has multiple joint pains and has been followed by rheumatology in the past with negative workup.

## 2013-12-12 NOTE — Assessment & Plan Note (Signed)
Was seen by worked in the past for her shoulder and was told she had bursitis. She does sleep has some inflammation by one of the sutures coming from her neck pain in general. She is on a pain contract I will give her a shot of prednisone and the prednisone taper which has helped in the past  She does not improve she can return orthopedics for reevaluation

## 2013-12-12 NOTE — Progress Notes (Signed)
   Subjective:    Patient ID: Theresa Washington, female    DOB: September 08, 1981, 33 y.o.   MRN: [id number]  HPI Patient here with acute pain in her left shoulder and hip. States that she woke up last night he began having severe pain and swelling in her shoulder and hip. She's not had any falls or particular injuries. She was treated for bursitis in the same areas a couple years ago. She has chronic fibromyalgia as well as chronic neck pain and degenerative disc disease. She's currently in a pain clinic. She feels like her shoulder is swollen. She has pain moving her arm above her shoulder height   Review of Systems - per above  GEN- denies fatigue, fever, weight loss,weakness, recent illness MSK-+joint pain, muscle aches, injury Neuro- denies headache, dizziness, syncope, seizure activity       Objective:   Physical Exam  GEN-NAD,alert and oriented x 3 Neck- Decreased ROM, TTP c spine across trapezius Left > R MSK- Left Shoulder- mild swelling across trapezius region, joint appears in tact, no warmth, decreased extension, unable to lift above shoulder height, rotator cuff appears in tact Back- wearing back brace, Hip TTP over greater trochanter, normal ROM hip but pain with ROM, neg SLR. Antalgic gait Ext- No edema       Assessment & Plan:

## 2013-12-20 ENCOUNTER — Other Ambulatory Visit: Payer: Self-pay | Admitting: Family Medicine

## 2013-12-20 MED ORDER — HYDROCHLOROTHIAZIDE 25 MG PO TABS: 25.0000 mg | ORAL_TABLET | Freq: Every day | ORAL | Status: DC

## 2013-12-20 NOTE — Telephone Encounter (Signed)
Rx Refilled  

## 2013-12-27 ENCOUNTER — Other Ambulatory Visit: Payer: Self-pay | Admitting: Family Medicine

## 2013-12-27 MED ORDER — MILNACIPRAN HCL 50 MG PO TABS: 50.0000 mg | ORAL_TABLET | Freq: Two times a day (BID) | ORAL | Status: DC

## 2013-12-27 NOTE — Telephone Encounter (Signed)
Rx Refilled  

## 2014-01-01 ENCOUNTER — Telehealth: Payer: Self-pay | Admitting: Family Medicine

## 2014-01-01 NOTE — Telephone Encounter (Signed)
Requesting refill on Diazepam 5mg  ? OK to Refill

## 2014-01-03 MED ORDER — DIAZEPAM 5 MG PO TABS: 5.0000 mg | ORAL_TABLET | Freq: Two times a day (BID) | ORAL | Status: DC | PRN

## 2014-01-03 NOTE — Telephone Encounter (Signed)
ok 

## 2014-01-03 NOTE — Telephone Encounter (Signed)
Rx Refilled  

## 2014-01-05 ENCOUNTER — Telehealth: Payer: Self-pay | Admitting: Family Medicine

## 2014-01-05 DIAGNOSIS — M7070 Other bursitis of hip, unspecified hip: Secondary | ICD-10-CM

## 2014-01-05 DIAGNOSIS — M25519 Pain in unspecified shoulder: Secondary | ICD-10-CM

## 2014-01-05 NOTE — Telephone Encounter (Signed)
Pt state the prednisone is not working and she does need a referral to the ortho dr she would like to use [employer] back number is [phone]

## 2014-01-06 NOTE — Telephone Encounter (Signed)
I did not give her prednisone.  I am not sure what she is referring to?

## 2014-01-06 NOTE — Telephone Encounter (Signed)
Sent to Dr. Dennard Schaumann by mistake - Dr. Buelah Manis Rx'd pred.  ???

## 2014-01-07 NOTE — Telephone Encounter (Signed)
Referral placed.

## 2014-01-19 ENCOUNTER — Other Ambulatory Visit (HOSPITAL_COMMUNITY): Payer: Self-pay | Admitting: Anesthesiology

## 2014-01-19 DIAGNOSIS — M549 Dorsalgia, unspecified: Secondary | ICD-10-CM

## 2014-01-19 DIAGNOSIS — M542 Cervicalgia: Secondary | ICD-10-CM

## 2014-01-21 ENCOUNTER — Ambulatory Visit (HOSPITAL_COMMUNITY)
Admission: RE | Admit: 2014-01-21 | Discharge: 2014-01-21 | Disposition: A | Discharge status: Home / Self Care | Payer: 59 | Source: Ambulatory Visit | Attending: Anesthesiology | Admitting: Anesthesiology

## 2014-01-21 DIAGNOSIS — R209 Unspecified disturbances of skin sensation: Secondary | ICD-10-CM | POA: Insufficient documentation

## 2014-01-21 DIAGNOSIS — M545 Low back pain, unspecified: Secondary | ICD-10-CM | POA: Insufficient documentation

## 2014-01-21 DIAGNOSIS — M542 Cervicalgia: Secondary | ICD-10-CM

## 2014-01-21 DIAGNOSIS — M5126 Other intervertebral disc displacement, lumbar region: Secondary | ICD-10-CM | POA: Insufficient documentation

## 2014-01-21 DIAGNOSIS — M48061 Spinal stenosis, lumbar region without neurogenic claudication: Secondary | ICD-10-CM | POA: Insufficient documentation

## 2014-01-21 DIAGNOSIS — H532 Diplopia: Secondary | ICD-10-CM | POA: Insufficient documentation

## 2014-01-21 DIAGNOSIS — R51 Headache: Secondary | ICD-10-CM | POA: Insufficient documentation

## 2014-01-21 DIAGNOSIS — M549 Dorsalgia, unspecified: Secondary | ICD-10-CM

## 2014-01-21 DIAGNOSIS — M4802 Spinal stenosis, cervical region: Secondary | ICD-10-CM | POA: Insufficient documentation

## 2014-01-21 DIAGNOSIS — M5030 Other cervical disc degeneration, unspecified cervical region: Secondary | ICD-10-CM | POA: Insufficient documentation

## 2014-01-21 DIAGNOSIS — M79609 Pain in unspecified limb: Secondary | ICD-10-CM | POA: Insufficient documentation

## 2014-02-01 ENCOUNTER — Ambulatory Visit (INDEPENDENT_AMBULATORY_CARE_PROVIDER_SITE_OTHER): Payer: 59 | Admitting: Orthopedic Surgery

## 2014-02-01 ENCOUNTER — Ambulatory Visit (INDEPENDENT_AMBULATORY_CARE_PROVIDER_SITE_OTHER): Payer: 59

## 2014-02-01 VITALS — BP 130/71 | Ht 62.0 in | Wt 161.0 lb

## 2014-02-01 DIAGNOSIS — M25559 Pain in unspecified hip: Secondary | ICD-10-CM

## 2014-02-01 NOTE — Patient Instructions (Signed)
Need ESI in lumbar

## 2014-02-01 NOTE — Progress Notes (Signed)
Patient ID: Theresa Washington, female   DOB: 23-Jul-1981, 33 y.o.   MRN: [id number]   Chief Complaint  Patient presents with  . Hip Pain    Left hip pain, no injury. Referral from Dr. Buelah Manis for consult and treat.    Consult has been requested for possible bursitis left hip.  Patient's following medical problems: Past Medical History  Diagnosis Date  . Von Willebrand disease   . Seizures   . Bell's palsy   . Back pain   . Kidney stone   . Degenerative disc disease   . DDD (degenerative disc disease)   . Spondylosis   . Fibromyalgia   . DDD (degenerative disc disease)   . Ankylosing spondylitis   . Fibromyalgia   . PVC (premature ventricular contraction)   . Tachycardia   . Anemia     history of  . Anxiety     stress    She has seen Dr. Ellene Route she's had epidural injections lumbar, undergoing epidurals cervical. Presents complaining of lower back pain buttock pain radiating into the left knee occasionally into the left foot with a burning sensation. This is exacerbated by hip flexion without groin or anterior thigh pain. She is on several medications including Percocet diazepam Flexeril Lyrica prednisone in the past as well.  Pain is 10 out of 10 it's constant it's associated with numbness and tingling  Her review of systems as recorded updated and signed as reviewed on 02/01/2014. Medical problems as stated. Examination normal appearance in terms of body habitus oriented x3 mood pleasant gait unremarkable vital signs stable. BP 130/71  Ht 5\' 2"  (1.575 m)  Wt 161 lb (73.029 kg)  BMI 29.44 kg/m2  The greater trochanters are nontender but the lower back is tender. Hip flexion, adduction internal rotation does not reproduce groin pain or thigh pain but pain is noted in the lumbar spine. Muscle tone normal hips are stable scans intact good distal pulses. No lymphadenopathy. No pathologic reflexes  Hip joint shows spherical head there is sharp margin of the acetabulum but  without clinical signs or symptoms of impingement is not thought to be significant for acetabular impingement pincer-type syndrome  Recommend the patient have epidurals again and see the spine specialist for further care.

## 2014-02-09 ENCOUNTER — Other Ambulatory Visit: Payer: Self-pay | Admitting: Family Medicine

## 2014-02-09 MED ORDER — DIAZEPAM 5 MG PO TABS: 5.0000 mg | ORAL_TABLET | Freq: Two times a day (BID) | ORAL | Status: DC | PRN

## 2014-02-09 NOTE — Telephone Encounter (Signed)
Rx Refilled  

## 2014-02-24 DIAGNOSIS — M479 Spondylosis, unspecified: Secondary | ICD-10-CM | POA: Insufficient documentation

## 2014-03-16 ENCOUNTER — Other Ambulatory Visit: Payer: Self-pay | Admitting: Family Medicine

## 2014-03-16 MED ORDER — DIAZEPAM 5 MG PO TABS: 5.0000 mg | ORAL_TABLET | Freq: Two times a day (BID) | ORAL | Status: DC | PRN

## 2014-03-16 NOTE — Telephone Encounter (Signed)
Rx Refilled  

## 2014-03-30 ENCOUNTER — Other Ambulatory Visit: Payer: Self-pay | Admitting: Family Medicine

## 2014-03-30 MED ORDER — PREGABALIN 75 MG PO CAPS: 75.0000 mg | ORAL_CAPSULE | Freq: Two times a day (BID) | ORAL | Status: DC

## 2014-03-30 NOTE — Telephone Encounter (Signed)
rx was printed and faxed to pharmacy 

## 2014-04-12 ENCOUNTER — Other Ambulatory Visit: Payer: Self-pay | Admitting: Family Medicine

## 2014-04-12 MED ORDER — NEBIVOLOL HCL 5 MG PO TABS: 5.0000 mg | ORAL_TABLET | Freq: Every day | ORAL | Status: DC

## 2014-04-12 NOTE — Telephone Encounter (Signed)
Rx Refilled  

## 2014-04-14 ENCOUNTER — Other Ambulatory Visit: Payer: Self-pay | Admitting: *Deleted

## 2014-04-14 MED ORDER — CYCLOBENZAPRINE HCL 10 MG PO TABS: 10.0000 mg | ORAL_TABLET | Freq: Three times a day (TID) | ORAL | Status: DC | PRN

## 2014-04-14 NOTE — Telephone Encounter (Signed)
Refill appropriate and filled per protocol. 

## 2014-04-15 ENCOUNTER — Ambulatory Visit: Payer: 59 | Admitting: *Deleted

## 2014-04-15 ENCOUNTER — Telehealth: Payer: Self-pay | Admitting: *Deleted

## 2014-04-15 VITALS — BP 110/60

## 2014-04-15 DIAGNOSIS — Z0130 Encounter for examination of blood pressure without abnormal findings: Secondary | ICD-10-CM

## 2014-04-15 NOTE — Telephone Encounter (Signed)
This blood pressure and heart rate is normal.  This is not causing her to "black out."  NTBS

## 2014-04-15 NOTE — Telephone Encounter (Signed)
Pt came in today for BP check and it was 110/60 with P- 92 pt had stated that she had 3 blackouts this week while laying down w/blurred vision, her gait was unbalanced and had slight slurring when speaking, now she is feeling better and says every thing was fine.  Pt has been taking her BP for last 3 days:  5/27: L arm at 9:17pm her BP was 121/96 P 104 sitting 9am same day 119/86 P 109 5/28: L arm at 8am BP 156/106 P 134 then at 1245pm 103/85 P 102 sitting, 10:00pm  R arm 128/96 P 94 sitting  5/29: 118/88 P 94 then at 9:00am 156/124 p: 136 sitting  10:30a 118/64 P 60, 10:35 156/50 P 58

## 2014-04-15 NOTE — Progress Notes (Signed)
Patient ID: Theresa Washington, female   DOB: Oct 03, 1981, 33 y.o.   MRN: [id number] Pt came in today for BP check and it was 110/60 with P- 92 pt had stated that she had 3 blackouts this week while laying down w/blurred vision, her gait was unbalanced and had slight slurring when speaking.  Pt has been taking her BP for last 3 days:  5/27: L arm at 9:17pm her BP was 121/96 P 104 sitting 9am same day 119/86 P 109 5/28: L arm at 8am BP 156/106 P 134 then at 1245pm 103/85 P 102 sitting, 10:00pm  R arm 128/96 P 94 sitting  5/29: 118/88 P 94 then at 9:00am 156/124 p: 136 sitting  10:30a 118/64 P 60, 10:35 156/50 P 58

## 2014-04-18 NOTE — Telephone Encounter (Signed)
Pt is aware and has appt on Tues June 2 w/WTP

## 2014-04-19 ENCOUNTER — Ambulatory Visit (INDEPENDENT_AMBULATORY_CARE_PROVIDER_SITE_OTHER): Payer: 59 | Admitting: Family Medicine

## 2014-04-19 ENCOUNTER — Encounter: Payer: Self-pay | Admitting: Family Medicine

## 2014-04-19 VITALS — BP 110/70 | HR 86 | Temp 98.3°F | Resp 18 | Ht 62.0 in | Wt 170.0 lb

## 2014-04-19 DIAGNOSIS — R55 Syncope and collapse: Secondary | ICD-10-CM

## 2014-04-19 LAB — CBC WITH DIFFERENTIAL/PLATELET
BASOS ABS: 0 10*3/uL (ref 0.0–0.1)
BASOS PCT: 0 % (ref 0–1)
Eosinophils Absolute: 0.1 10*3/uL (ref 0.0–0.7)
Eosinophils Relative: 1 % (ref 0–5)
HCT: 35.3 % — ABNORMAL LOW (ref 36.0–46.0)
Hemoglobin: 12.3 g/dL (ref 12.0–15.0)
Lymphocytes Relative: 29 % (ref 12–46)
Lymphs Abs: 3.8 10*3/uL (ref 0.7–4.0)
MCH: 30.2 pg (ref 26.0–34.0)
MCHC: 34.8 g/dL (ref 30.0–36.0)
MCV: 86.7 fL (ref 78.0–100.0)
MONO ABS: 0.7 10*3/uL (ref 0.1–1.0)
Monocytes Relative: 5 % (ref 3–12)
Neutro Abs: 8.5 10*3/uL — ABNORMAL HIGH (ref 1.7–7.7)
Neutrophils Relative %: 65 % (ref 43–77)
PLATELETS: 420 10*3/uL — AB (ref 150–400)
RBC: 4.07 MIL/uL (ref 3.87–5.11)
RDW: 13.6 % (ref 11.5–15.5)
WBC: 13 10*3/uL — ABNORMAL HIGH (ref 4.0–10.5)

## 2014-04-19 LAB — COMPLETE METABOLIC PANEL WITH GFR
ALK PHOS: 76 U/L (ref 39–117)
ALT: 20 U/L (ref 0–35)
AST: 13 U/L (ref 0–37)
Albumin: 3.7 g/dL (ref 3.5–5.2)
BUN: 15 mg/dL (ref 6–23)
CO2: 30 mEq/L (ref 19–32)
Calcium: 9.1 mg/dL (ref 8.4–10.5)
Chloride: 100 mEq/L (ref 96–112)
Creat: 0.69 mg/dL (ref 0.50–1.10)
GFR, Est African American: >89 mL/min
GFR, Est Non African American: >89 mL/min
Glucose, Bld: 72 mg/dL (ref 70–99)
POTASSIUM: 3.6 meq/L (ref 3.5–5.3)
Sodium: 141 mEq/L (ref 135–145)
Total Bilirubin: 0.3 mg/dL (ref 0.2–1.2)
Total Protein: 6.7 g/dL (ref 6.0–8.3)

## 2014-04-19 LAB — TSH: TSH: 4.102 u[IU]/mL (ref 0.350–4.500)
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
## 2014-04-22 ENCOUNTER — Encounter: Payer: Self-pay | Admitting: Family Medicine

## 2014-04-25 ENCOUNTER — Ambulatory Visit (HOSPITAL_COMMUNITY)
Admission: RE | Admit: 2014-04-25 | Discharge: 2014-04-25 | Disposition: A | Discharge status: Home / Self Care | Payer: 59 | Source: Ambulatory Visit | Attending: Family Medicine | Admitting: Family Medicine

## 2014-04-25 DIAGNOSIS — R002 Palpitations: Secondary | ICD-10-CM | POA: Insufficient documentation

## 2014-04-25 DIAGNOSIS — R55 Syncope and collapse: Secondary | ICD-10-CM

## 2014-04-25 NOTE — Progress Notes (Signed)
48 hour Holter Monitor in progress. 

## 2014-04-26 ENCOUNTER — Observation Stay (HOSPITAL_COMMUNITY)
Admission: EM | Admit: 2014-04-26 | Discharge: 2014-04-27 | Disposition: A | Discharge status: Home / Self Care | Payer: 59 | Attending: Internal Medicine | Admitting: Internal Medicine

## 2014-04-26 ENCOUNTER — Encounter (HOSPITAL_COMMUNITY): Payer: Self-pay | Admitting: Emergency Medicine

## 2014-04-26 ENCOUNTER — Emergency Department (HOSPITAL_COMMUNITY): Payer: 59

## 2014-04-26 DIAGNOSIS — F17200 Nicotine dependence, unspecified, uncomplicated: Secondary | ICD-10-CM

## 2014-04-26 DIAGNOSIS — M479 Spondylosis, unspecified: Secondary | ICD-10-CM | POA: Insufficient documentation

## 2014-04-26 DIAGNOSIS — M01X0 Direct infection of unspecified joint in infectious and parasitic diseases classified elsewhere: Secondary | ICD-10-CM | POA: Insufficient documentation

## 2014-04-26 DIAGNOSIS — Z79899 Other long term (current) drug therapy: Secondary | ICD-10-CM | POA: Insufficient documentation

## 2014-04-26 DIAGNOSIS — I4949 Other premature depolarization: Secondary | ICD-10-CM | POA: Insufficient documentation

## 2014-04-26 DIAGNOSIS — M5137 Other intervertebral disc degeneration, lumbosacral region: Secondary | ICD-10-CM

## 2014-04-26 DIAGNOSIS — R002 Palpitations: Secondary | ICD-10-CM

## 2014-04-26 DIAGNOSIS — D649 Anemia, unspecified: Secondary | ICD-10-CM | POA: Insufficient documentation

## 2014-04-26 DIAGNOSIS — J449 Chronic obstructive pulmonary disease, unspecified: Secondary | ICD-10-CM

## 2014-04-26 DIAGNOSIS — F411 Generalized anxiety disorder: Secondary | ICD-10-CM | POA: Insufficient documentation

## 2014-04-26 DIAGNOSIS — D6800 Von Willebrand disease, unspecified: Secondary | ICD-10-CM

## 2014-04-26 DIAGNOSIS — R42 Dizziness and giddiness: Secondary | ICD-10-CM | POA: Insufficient documentation

## 2014-04-26 DIAGNOSIS — M542 Cervicalgia: Secondary | ICD-10-CM

## 2014-04-26 DIAGNOSIS — R5381 Other malaise: Secondary | ICD-10-CM | POA: Insufficient documentation

## 2014-04-26 DIAGNOSIS — J029 Acute pharyngitis, unspecified: Secondary | ICD-10-CM

## 2014-04-26 DIAGNOSIS — J209 Acute bronchitis, unspecified: Secondary | ICD-10-CM

## 2014-04-26 DIAGNOSIS — R079 Chest pain, unspecified: Secondary | ICD-10-CM

## 2014-04-26 DIAGNOSIS — L989 Disorder of the skin and subcutaneous tissue, unspecified: Secondary | ICD-10-CM | POA: Insufficient documentation

## 2014-04-26 DIAGNOSIS — Z8619 Personal history of other infectious and parasitic diseases: Secondary | ICD-10-CM | POA: Insufficient documentation

## 2014-04-26 DIAGNOSIS — M459 Ankylosing spondylitis of unspecified sites in spine: Secondary | ICD-10-CM | POA: Insufficient documentation

## 2014-04-26 DIAGNOSIS — R5383 Other fatigue: Secondary | ICD-10-CM

## 2014-04-26 DIAGNOSIS — IMO0001 Reserved for inherently not codable concepts without codable children: Secondary | ICD-10-CM

## 2014-04-26 DIAGNOSIS — Z87442 Personal history of urinary calculi: Secondary | ICD-10-CM | POA: Insufficient documentation

## 2014-04-26 DIAGNOSIS — G40909 Epilepsy, unspecified, not intractable, without status epilepticus: Secondary | ICD-10-CM | POA: Insufficient documentation

## 2014-04-26 DIAGNOSIS — M797 Fibromyalgia: Secondary | ICD-10-CM | POA: Diagnosis present

## 2014-04-26 DIAGNOSIS — IMO0002 Reserved for concepts with insufficient information to code with codable children: Secondary | ICD-10-CM | POA: Insufficient documentation

## 2014-04-26 DIAGNOSIS — R55 Syncope and collapse: Principal | ICD-10-CM

## 2014-04-26 DIAGNOSIS — L301 Dyshidrosis [pompholyx]: Secondary | ICD-10-CM | POA: Insufficient documentation

## 2014-04-26 DIAGNOSIS — G510 Bell's palsy: Secondary | ICD-10-CM | POA: Insufficient documentation

## 2014-04-26 HISTORY — DX: Myositis, unspecified: M60.9

## 2014-04-26 HISTORY — DX: Arthritis due to Lyme disease: A69.23

## 2014-04-26 HISTORY — DX: Palpitations: R00.2

## 2014-04-26 LAB — BASIC METABOLIC PANEL
BUN: 10 mg/dL (ref 6–23)
CHLORIDE: 96 meq/L (ref 96–112)
CO2: 28 mEq/L (ref 19–32)
CREATININE: 0.73 mg/dL (ref 0.50–1.10)
Calcium: 9.2 mg/dL (ref 8.4–10.5)
GFR calc Af Amer: >90 mL/min (ref >90)
GFR calc non Af Amer: >90 mL/min (ref >90)
GLUCOSE: 107 mg/dL — AB (ref 70–99)
Potassium: 2.7 mEq/L — CL (ref 3.7–5.3)
Sodium: 139 mEq/L (ref 137–147)

## 2014-04-26 LAB — CBC WITH DIFFERENTIAL/PLATELET
Basophils Absolute: 0 10*3/uL (ref 0.0–0.1)
Basophils Relative: 0 % (ref 0–1)
Eosinophils Absolute: 0.1 10*3/uL (ref 0.0–0.7)
Eosinophils Relative: 1 % (ref 0–5)
HEMATOCRIT: 36.9 % (ref 36.0–46.0)
HEMOGLOBIN: 12.7 g/dL (ref 12.0–15.0)
LYMPHS ABS: 2.9 10*3/uL (ref 0.7–4.0)
LYMPHS PCT: 21 % (ref 12–46)
MCH: 30.8 pg (ref 26.0–34.0)
MCHC: 34.4 g/dL (ref 30.0–36.0)
MCV: 89.3 fL (ref 78.0–100.0)
MONO ABS: 0.6 10*3/uL (ref 0.1–1.0)
MONOS PCT: 5 % (ref 3–12)
NEUTROS ABS: 10 10*3/uL — AB (ref 1.7–7.7)
Neutrophils Relative %: 73 % (ref 43–77)
Platelets: 406 10*3/uL — ABNORMAL HIGH (ref 150–400)
RBC: 4.13 MIL/uL (ref 3.87–5.11)
RDW: 13.3 % (ref 11.5–15.5)
WBC: 13.6 10*3/uL — AB (ref 4.0–10.5)

## 2014-04-26 LAB — D-DIMER, QUANTITATIVE: D-Dimer, Quant: 0.4 ug/mL-FEU (ref 0.00–0.48)

## 2014-04-26 LAB — TROPONIN I
Troponin I: <0.3 ng/mL (ref <0.30)
Troponin I: <0.3 ng/mL (ref <0.30)

## 2014-04-26 MED ORDER — SODIUM CHLORIDE 0.9 % IV SOLN: 250.0000 mL | INTRAVENOUS | Status: DC | PRN

## 2014-04-26 MED ORDER — HYDROCHLOROTHIAZIDE 25 MG PO TABS
25.0000 mg | ORAL_TABLET | Freq: Every day | ORAL | Status: DC
  Administered 2014-04-27: 25 mg via ORAL
  Filled 2014-04-26: qty 1

## 2014-04-26 MED ORDER — ONDANSETRON HCL 4 MG PO TABS: 4.0000 mg | ORAL_TABLET | Freq: Four times a day (QID) | ORAL | Status: DC | PRN

## 2014-04-26 MED ORDER — CYCLOBENZAPRINE HCL 10 MG PO TABS: 10.0000 mg | ORAL_TABLET | Freq: Three times a day (TID) | ORAL | Status: DC | PRN

## 2014-04-26 MED ORDER — DIAZEPAM 5 MG PO TABS: 5.0000 mg | ORAL_TABLET | Freq: Two times a day (BID) | ORAL | Status: DC | PRN

## 2014-04-26 MED ORDER — FLUTICASONE PROPIONATE 50 MCG/ACT NA SUSP
2.0000 | Freq: Every day | NASAL | Status: DC
  Administered 2014-04-26 – 2014-04-27 (×2): 2 via NASAL
  Filled 2014-04-26 (×2): qty 16

## 2014-04-26 MED ORDER — PREGABALIN 75 MG PO CAPS
75.0000 mg | ORAL_CAPSULE | Freq: Two times a day (BID) | ORAL | Status: DC
  Administered 2014-04-26 – 2014-04-27 (×2): 75 mg via ORAL
  Filled 2014-04-26 (×2): qty 1

## 2014-04-26 MED ORDER — OXYCODONE-ACETAMINOPHEN 5-325 MG PO TABS
2.0000 | ORAL_TABLET | Freq: Three times a day (TID) | ORAL | Status: DC | PRN
  Administered 2014-04-27: 2 via ORAL
  Filled 2014-04-26: qty 2

## 2014-04-26 MED ORDER — SODIUM CHLORIDE 0.9 % IJ SOLN
3.0000 mL | Freq: Two times a day (BID) | INTRAMUSCULAR | Status: DC
  Administered 2014-04-26 – 2014-04-27 (×2): 3 mL via INTRAVENOUS

## 2014-04-26 MED ORDER — MILNACIPRAN HCL 50 MG PO TABS
50.0000 mg | ORAL_TABLET | Freq: Two times a day (BID) | ORAL | Status: DC
  Administered 2014-04-26 – 2014-04-27 (×2): 50 mg via ORAL
  Filled 2014-04-26 (×6): qty 1

## 2014-04-26 MED ORDER — POTASSIUM CHLORIDE CRYS ER 20 MEQ PO TBCR
40.0000 meq | EXTENDED_RELEASE_TABLET | ORAL | Status: AC
  Administered 2014-04-26 – 2014-04-27 (×2): 40 meq via ORAL
  Filled 2014-04-26 (×2): qty 2

## 2014-04-26 MED ORDER — NEBIVOLOL HCL 2.5 MG PO TABS
5.0000 mg | ORAL_TABLET | Freq: Every day | ORAL | Status: DC
  Administered 2014-04-27: 5 mg via ORAL
  Filled 2014-04-26: qty 1
  Filled 2014-04-26 (×2): qty 2

## 2014-04-26 MED ORDER — ONDANSETRON HCL 4 MG/2ML IJ SOLN: 4.0000 mg | Freq: Four times a day (QID) | INTRAMUSCULAR | Status: DC | PRN

## 2014-04-26 MED ORDER — POTASSIUM CHLORIDE CRYS ER 20 MEQ PO TBCR
40.0000 meq | EXTENDED_RELEASE_TABLET | Freq: Once | ORAL | Status: AC
  Administered 2014-04-26: 40 meq via ORAL
  Filled 2014-04-26: qty 2

## 2014-04-26 MED ORDER — SODIUM CHLORIDE 0.9 % IJ SOLN
3.0000 mL | INTRAMUSCULAR | Status: DC | PRN
Start: 2014-04-26 — End: 2014-04-27

## 2014-04-26 NOTE — ED Notes (Signed)
Chest pain 2 episodes today.  Has on holter moniter to eval syncope.

## 2014-04-26 NOTE — H&P (Signed)
PCP:   PICKARD,WARREN TOM, MD   Chief Complaint:  Chest pain  HPI: 73 female who   has a past medical history of Von Willebrand disease; Seizures; Bell's palsy; Back pain; Degenerative disc disease; DDD (degenerative disc disease); Spondylosis; Fibromyalgia; DDD (degenerative disc disease); Ankylosing spondylitis; Fibromyalgia; PVC (premature ventricular contraction); Tachycardia; Anemia; Anxiety; Kidney stone; Myositis; and Lyme arthritis. Presents to the ED with chief complaint of chest pain associated with diaphoresis, facial redness lightheadedness this morning while she was doing dishes. After the patient went to the bedroom and her husband note is that patient had redness on the face. Patient did not pass out though she has had recent several episodes of syncope and is currently wearing a Holter monitor. Cardiology will follow the patient on Wednesday to evaluate the Holter monitor. She denies nausea vomiting or diarrhea. Denies passing out. The chest pain is intermittent and felt like a pressure on the left side. She denies shortness of breath, patient does have a history of panic disorder and has had panic attacks, palpitations. Today also patient had palpitations. She also has a history of fibromyalgia, pseudoseizures.  Allergies:   Allergies  Allergen Reactions  . Shrimp [Shellfish Allergy] Anaphylaxis, Hives and Swelling  . Other     Pt states "I carry an Epipen. I had anaphylactic reaction to something but I don't know what".       Past Medical History  Diagnosis Date  . Von Willebrand disease   . Seizures   . Bell's palsy   . Back pain   . Degenerative disc disease   . DDD (degenerative disc disease)   . Spondylosis   . Fibromyalgia   . DDD (degenerative disc disease)   . Ankylosing spondylitis   . Fibromyalgia   . PVC (premature ventricular contraction)   . Tachycardia   . Anemia     history of  . Anxiety     stress  . Kidney stone   . Myositis   . Lyme  arthritis     Past Surgical History  Procedure Laterality Date  . Oophorectomy      left side    Prior to Admission medications   Medication Sig Start Date End Date Taking? Authorizing Provider  clobetasol ointment (TEMOVATE) 5.85 % Apply 1 application topically daily as needed (for rash).    Yes Historical Provider, MD  cyclobenzaprine (FLEXERIL) 10 MG tablet Take 1 tablet (10 mg total) by mouth 3 (three) times daily as needed. For muscle spasms 04/14/14  Yes Alycia Rossetti, MD  diazepam (VALIUM) 5 MG tablet Take 1 tablet (5 mg total) by mouth every 12 (twelve) hours as needed for anxiety. For muscle spasms 03/16/14  Yes Susy Frizzle, MD  diclofenac sodium (VOLTAREN) 1 % GEL Apply 1 application topically daily as needed. For pain   Yes Historical Provider, MD  Diphenhydramine-APAP, sleep, (GOODY PM PO) Take 1 packet by mouth at bedtime as needed (for pain/sleep).   Yes Historical Provider, MD  etonogestrel-ethinyl estradiol (NUVARING) 0.12-0.015 MG/24HR vaginal ring Place 1 each vaginally every 28 (twenty-eight) days. Insert vaginally and leave in place for 3 consecutive weeks, then remove for 1 week.    Yes Historical Provider, MD  fluticasone (FLONASE) 50 MCG/ACT nasal spray Place 2 sprays into both nostrils daily. 10/13/13  Yes Alycia Rossetti, MD  hydrochlorothiazide (HYDRODIURIL) 25 MG tablet Take 1 tablet (25 mg total) by mouth daily. 12/20/13  Yes Susy Frizzle, MD  ibuprofen (ADVIL,MOTRIN) 800 MG  tablet Take 800 mg by mouth every 8 (eight) hours as needed.   Yes Historical Provider, MD  methylcellulose (ARTIFICIAL TEARS) 1 % ophthalmic solution Place 1 drop into both eyes daily as needed. Dry Eyes   Yes Historical Provider, MD  Milnacipran (SAVELLA) 50 MG TABS tablet Take 1 tablet (50 mg total) by mouth 2 (two) times daily. 12/27/13  Yes Susy Frizzle, MD  nebivolol (BYSTOLIC) 5 MG tablet Take 1 tablet (5 mg total) by mouth daily. 04/12/14  Yes Susy Frizzle, MD    oxyCODONE-acetaminophen (PERCOCET) 10-325 MG per tablet Take 2 tablets by mouth every 8 (eight) hours as needed for pain.   Yes Historical Provider, MD  pregabalin (LYRICA) 75 MG capsule Take 1 capsule (75 mg total) by mouth 2 (two) times daily. 03/30/14  Yes Susy Frizzle, MD    Social History:  reports that she has been smoking Cigarettes.  She has a 12 pack-year smoking history. She has never used smokeless tobacco. She reports that she does not drink alcohol or use illicit drugs.  Family History  Problem Relation Age of Onset  . Heart disease Father   . Kidney disease Father   . Alcohol abuse Brother   . Mental retardation Brother   . Cancer Maternal Grandmother     colon     All the positives are listed in BOLD  Review of Systems:  HEENT: Headache, blurred vision, runny nose, sore throat Neck: Hypothyroidism, hyperthyroidism,,lymphadenopathy Chest : Shortness of breath, history of COPD, Asthma Heart : Chest pain, history of coronary arterey disease GI:  Nausea, vomiting, diarrhea, constipation, GERD GU: Dysuria, urgency, frequency of urination, hematuria Neuro: Stroke, seizures, syncope Psych: Depression, anxiety, hallucinations   Physical Exam: Blood pressure 125/74, pulse 92, temperature 99.1 F (37.3 C), temperature source Oral, resp. rate 16, height 4' 9.25" (1.454 m), weight 72.576 kg (160 lb), last menstrual period 04/26/2002, SpO2 98.00%. Constitutional:   Patient is a well-developed and well-nourished *female in no acute distress and cooperative with exam. Head: Normocephalic and atraumatic Mouth: Mucus membranes moist Eyes: PERRL, EOMI, conjunctivae normal Neck: Supple, No Thyromegaly Cardiovascular: RRR, S1 normal, S2 normal Pulmonary/Chest: CTAB, no wheezes, rales, or rhonchi Abdominal: Soft. Non-tender, non-distended, bowel sounds are normal, no masses, organomegaly, or guarding present.  Neurological: A&O x3, Strenght is normal and symmetric  bilaterally, cranial nerve II-XII are grossly intact, no focal motor deficit, sensory intact to light touch bilaterally.  Extremities : No Cyanosis, Clubbing or Edema  Labs on Admission:  Basic Metabolic Panel:  Recent Labs Lab 04/26/14 1810  NA 139  K 2.7*  CL 96  CO2 28  GLUCOSE 107*  BUN 10  CREATININE 0.73  CALCIUM 9.2   Liver Function Tests: No results found for this basename: AST, ALT, ALKPHOS, BILITOT, PROT, ALBUMIN,  in the last 168 hours No results found for this basename: LIPASE, AMYLASE,  in the last 168 hours No results found for this basename: AMMONIA,  in the last 168 hours CBC:  Recent Labs Lab 04/26/14 1810  WBC 13.6*  NEUTROABS 10.0*  HGB 12.7  HCT 36.9  MCV 89.3  PLT 406*   Cardiac Enzymes:  Recent Labs Lab 04/26/14 1810  TROPONINI <0.30     Radiological Exams on Admission: Dg Chest 2 View  04/26/2014   CLINICAL DATA:  Chest pain and pressure.  EXAM: CHEST  2 VIEW  COMPARISON:  09/24/2012.  FINDINGS: The heart size and mediastinal contours are within normal limits. Both lungs are  clear. The visualized skeletal structures are unremarkable.  IMPRESSION: No active cardiopulmonary disease.  Stable chest.   Electronically Signed   By: Rolla Flatten M.D.   On: 04/26/2014 19:24    EKG: Independently reviewed. Sinus rhythm with nonspecific T-wave changes.   Assessment/Plan Active Problems:   Fibromyalgia   Von Willebrand disease   Pre-syncope   Near syncope   Chest pain  Chest pain We'll admit the patient under telemetry, cycle cardiac enzymes. She does have a cardiac risk factors including family history of heart disease and tobacco abuse.   Near-syncope Patient had an episode of near-syncope today. She is already wearing a Holter monitor, will get cardiology consultation in a.m. to evaluate the Holter monitor.  Fibromyalgia Patient will be continued on the home medications including Flexeril, valium, savella. Also continue Percocet and  lyrica.  Hypertension Continue HCTZ, will also obtain orthostatic vital signs to shift.  Hypokalemia Likely due to HCTZ, we'll replace potassium and check BMP in a.m.  DVT prophylaxis Will avoid Lovenox due to von Willebrand disease and start SCDs for DVT prophylaxis  Code status: Full code  Family discussion: Discussed with patient's husband at bedside  Time Spent on Admission: 60 minutes  [facility] Pager: [phone] 04/26/2014, 8:35 PM  If 7PM-7AM, please contact night-coverage  [email or web address]  Password [email or web address]

## 2014-04-26 NOTE — ED Provider Notes (Signed)
CSN: [id number]     Arrival date & time 04/26/14  1637 History  This chart was scribed for Theresa Christen, MD by Rolanda Lundborg, ED Scribe. This patient was seen in room APA15/[facility]15 and the patient's care was started at 4:53 PM.    Chief Complaint  Patient presents with  . Chest Pain   The history is provided by the patient. No language interpreter was used.   HPI Comments: Theresa Washington is a 33 y.o. female with a h/o Von Willebrand who presents to the Emergency Department complaining of sudden-onset CP that started today around noon with associated diaphoreses, lightheadedness, and facial redness. She reports a second episode shortly afterward. She reports fatigue after the episodes of CP and is still having fatigue currently. Pt is currently wearing a Holter monitor, applied yesterday, to evaluate several episodes of syncope. This is managed by Dr Jabier Mutton at [facility]. She had syncope in her 20's as well and was diagnosed with tachycardia and arhythmia that was "inconclusive". She states she has been having drops in blood pressure at home but no h/o HTN or hypotension. She had her thyroid tested last year and was normal.    Past Medical History  Diagnosis Date  . Von Willebrand disease   . Seizures   . Bell's palsy   . Back pain   . Degenerative disc disease   . DDD (degenerative disc disease)   . Spondylosis   . Fibromyalgia   . DDD (degenerative disc disease)   . Ankylosing spondylitis   . Fibromyalgia   . PVC (premature ventricular contraction)   . Tachycardia   . Anemia     history of  . Anxiety     stress  . Kidney stone   . Myositis   . Lyme arthritis    Past Surgical History  Procedure Laterality Date  . Oophorectomy      left side   Family History  Problem Relation Age of Onset  . Heart disease Father   . Kidney disease Father   . Alcohol abuse Brother   . Mental retardation Brother   . Cancer Maternal Grandmother     colon   History  Substance Use Topics  .  Smoking status: Current Every Day Smoker -- 1.00 packs/day for 12 years    Types: Cigarettes  . Smokeless tobacco: Never Used  . Alcohol Use: No   OB History   Grav Para Term Preterm Abortions TAB SAB Ect Mult Living                 Review of Systems  Constitutional: Positive for diaphoresis and fatigue.  Cardiovascular: Positive for chest pain.  Neurological: Positive for light-headedness.   A complete 10 system review of systems was obtained and all systems are negative except as noted in the HPI and PMH.     Allergies  Shrimp and Other  Home Medications   Prior to Admission medications   Medication Sig Start Date End Date Taking? Authorizing Provider  clobetasol ointment (TEMOVATE) 9.93 % Apply 1 application topically daily as needed (for rash).    Yes Historical Provider, MD  cyclobenzaprine (FLEXERIL) 10 MG tablet Take 1 tablet (10 mg total) by mouth 3 (three) times daily as needed. For muscle spasms 04/14/14  Yes Alycia Rossetti, MD  diazepam (VALIUM) 5 MG tablet Take 1 tablet (5 mg total) by mouth every 12 (twelve) hours as needed for anxiety. For muscle spasms 03/16/14  Yes Susy Frizzle, MD  diclofenac sodium (VOLTAREN) 1 % GEL Apply 1 application topically daily as needed. For pain   Yes Historical Provider, MD  Diphenhydramine-APAP, sleep, (GOODY PM PO) Take 1 packet by mouth at bedtime as needed (for pain/sleep).   Yes Historical Provider, MD  etonogestrel-ethinyl estradiol (NUVARING) 0.12-0.015 MG/24HR vaginal ring Place 1 each vaginally every 28 (twenty-eight) days. Insert vaginally and leave in place for 3 consecutive weeks, then remove for 1 week.    Yes Historical Provider, MD  fluticasone (FLONASE) 50 MCG/ACT nasal spray Place 2 sprays into both nostrils daily. 10/13/13  Yes Alycia Rossetti, MD  hydrochlorothiazide (HYDRODIURIL) 25 MG tablet Take 1 tablet (25 mg total) by mouth daily. 12/20/13  Yes Susy Frizzle, MD  ibuprofen (ADVIL,MOTRIN) 800 MG tablet Take  800 mg by mouth every 8 (eight) hours as needed.   Yes Historical Provider, MD  methylcellulose (ARTIFICIAL TEARS) 1 % ophthalmic solution Place 1 drop into both eyes daily as needed. Dry Eyes   Yes Historical Provider, MD  Milnacipran (SAVELLA) 50 MG TABS tablet Take 1 tablet (50 mg total) by mouth 2 (two) times daily. 12/27/13  Yes Susy Frizzle, MD  nebivolol (BYSTOLIC) 5 MG tablet Take 1 tablet (5 mg total) by mouth daily. 04/12/14  Yes Susy Frizzle, MD  oxyCODONE-acetaminophen (PERCOCET) 10-325 MG per tablet Take 2 tablets by mouth every 8 (eight) hours as needed for pain.   Yes Historical Provider, MD  pregabalin (LYRICA) 75 MG capsule Take 1 capsule (75 mg total) by mouth 2 (two) times daily. 03/30/14  Yes Susy Frizzle, MD   BP 135/79  Pulse 96  Temp(Src) 99.1 F (37.3 C) (Oral)  Resp 20  Ht 4' 9.25" (1.454 m)  Wt 160 lb (72.576 kg)  BMI 34.33 kg/m2  SpO2 96% Physical Exam  Nursing note and vitals reviewed. Constitutional: She is oriented to person, place, and time. She appears well-developed and well-nourished.  HENT:  Head: Normocephalic and atraumatic.  Eyes: Conjunctivae and EOM are normal. Pupils are equal, round, and reactive to light.  Neck: Normal range of motion. Neck supple.  Cardiovascular: Normal rate, regular rhythm and normal heart sounds.   Pulmonary/Chest: Effort normal and breath sounds normal.  Abdominal: Soft. Bowel sounds are normal.  Musculoskeletal: Normal range of motion.  Neurological: She is alert and oriented to person, place, and time.  Skin: Skin is warm and dry.  Psychiatric: She has a normal mood and affect. Her behavior is normal.    ED Course  Procedures (including critical care time) Medications  potassium chloride SA (K-DUR,KLOR-CON) CR tablet 40 mEq (40 mEq Oral Given 04/26/14 1926)    DIAGNOSTIC STUDIES: Oxygen Saturation is 96% on RA, normal by my interpretation.    COORDINATION OF CARE: 5:36 PM- Discussed treatment plan with  pt which includes CXR, troponin, D-dimer, and basic labs. Discussed likelihood of admission. Pt agrees to plan.    Labs Review Labs Reviewed  CBC WITH DIFFERENTIAL - Abnormal; Notable for the following:    WBC 13.6 (*)    Platelets 406 (*)    Neutro Abs 10.0 (*)    All other components within normal limits  BASIC METABOLIC PANEL - Abnormal; Notable for the following:    Potassium 2.7 (*)    Glucose, Bld 107 (*)    All other components within normal limits  TROPONIN I  D-DIMER, QUANTITATIVE    Imaging Review Dg Chest 2 View  04/26/2014   CLINICAL DATA:  Chest pain and pressure.  EXAM: CHEST  2 VIEW  COMPARISON:  09/24/2012.  FINDINGS: The heart size and mediastinal contours are within normal limits. Both lungs are clear. The visualized skeletal structures are unremarkable.  IMPRESSION: No active cardiopulmonary disease.  Stable chest.   Electronically Signed   By: Rolla Flatten M.D.   On: 04/26/2014 19:24     EKG Interpretation   Date/Time:  Tuesday April 26 2014 17:03:13 EDT Ventricular Rate:  99 PR Interval:  159 QRS Duration: 93 QT Interval:  356 QTC Calculation: 457 R Axis:   62 Text Interpretation:  Sinus rhythm Inferior infarct, age indeterminate  Lateral leads are also involved Baseline wander in lead(s) II Confirmed by  Yashas Camilli  MD, Samvel Zinn ([id number]) on 04/26/2014 5:33:37 PM      MDM   Final diagnoses:  Pre-syncope    S/p pre syncopal episode times two today with diaphoresis and right sided chest pain.  Patient is hemodynamically stable.  Admit to OBS  I personally performed the services described in this documentation, which was scribed in my presence. The recorded information has been reviewed and is accurate.    Theresa Christen, MD 04/26/14 2006

## 2014-04-26 NOTE — ED Notes (Signed)
Pt able to independently ambulate to bathroom.

## 2014-04-26 NOTE — ED Notes (Signed)
CRITICAL VALUE ALERT  Critical value received:  K+ 2.7  Date of notification:  04/26/14  Time of notification:  1920  Critical value read back:yes  Nurse who received alert:  Fredna Dow RN  MD notified (1st page):  Dr. Lacinda Axon  Time of first page:  1920  MD notified (2nd page):  Time of second page:  Responding MD:  Lacinda Axon  Time MD responded: [phone]

## 2014-04-26 NOTE — ED Notes (Signed)
Pt currently has 48 hour Holter monitor and schedule to return monitor on Wednesday.  H scheduled appointment with Dr Jabier Mutton on Monday June 15th..  Currently not having any pain os discomfort.  Husband at bedside.

## 2014-04-27 ENCOUNTER — Other Ambulatory Visit: Payer: Self-pay | Admitting: *Deleted

## 2014-04-27 ENCOUNTER — Encounter (HOSPITAL_COMMUNITY): Payer: Self-pay | Admitting: Cardiology

## 2014-04-27 DIAGNOSIS — I059 Rheumatic mitral valve disease, unspecified: Secondary | ICD-10-CM

## 2014-04-27 DIAGNOSIS — R002 Palpitations: Secondary | ICD-10-CM

## 2014-04-27 DIAGNOSIS — M5137 Other intervertebral disc degeneration, lumbosacral region: Secondary | ICD-10-CM

## 2014-04-27 DIAGNOSIS — R55 Syncope and collapse: Secondary | ICD-10-CM

## 2014-04-27 DIAGNOSIS — R079 Chest pain, unspecified: Secondary | ICD-10-CM

## 2014-04-27 DIAGNOSIS — M51379 Other intervertebral disc degeneration, lumbosacral region without mention of lumbar back pain or lower extremity pain: Secondary | ICD-10-CM

## 2014-04-27 HISTORY — DX: Palpitations: R00.2

## 2014-04-27 LAB — CBC
HCT: 36.5 % (ref 36.0–46.0)
HEMOGLOBIN: 12.4 g/dL (ref 12.0–15.0)
MCH: 30.8 pg (ref 26.0–34.0)
MCHC: 34 g/dL (ref 30.0–36.0)
MCV: 90.6 fL (ref 78.0–100.0)
PLATELETS: 368 10*3/uL (ref 150–400)
RBC: 4.03 MIL/uL (ref 3.87–5.11)
RDW: 13.3 % (ref 11.5–15.5)
WBC: 13 10*3/uL — ABNORMAL HIGH (ref 4.0–10.5)

## 2014-04-27 LAB — COMPREHENSIVE METABOLIC PANEL
ALK PHOS: 91 U/L (ref 39–117)
ALT: 17 U/L (ref 0–35)
AST: 11 U/L (ref 0–37)
Albumin: 2.8 g/dL — ABNORMAL LOW (ref 3.5–5.2)
BUN: 9 mg/dL (ref 6–23)
CO2: 27 mEq/L (ref 19–32)
Calcium: 8.8 mg/dL (ref 8.4–10.5)
Chloride: 96 mEq/L (ref 96–112)
Creatinine, Ser: 0.69 mg/dL (ref 0.50–1.10)
GFR calc Af Amer: >90 mL/min (ref >90)
GFR calc non Af Amer: >90 mL/min (ref >90)
GLUCOSE: 159 mg/dL — AB (ref 70–99)
Potassium: 3.5 mEq/L — ABNORMAL LOW (ref 3.7–5.3)
SODIUM: 137 meq/L (ref 137–147)
Total Bilirubin: 0.2 mg/dL — ABNORMAL LOW (ref 0.3–1.2)
Total Protein: 6.7 g/dL (ref 6.0–8.3)

## 2014-04-27 LAB — TROPONIN I
Troponin I: <0.3 ng/mL (ref <0.30)
Troponin I: <0.3 ng/mL (ref <0.30)

## 2014-04-27 MED ORDER — POTASSIUM CHLORIDE CRYS ER 20 MEQ PO TBCR
40.0000 meq | EXTENDED_RELEASE_TABLET | Freq: Once | ORAL | Status: AC
  Administered 2014-04-27: 40 meq via ORAL
  Filled 2014-04-27: qty 2

## 2014-04-27 NOTE — Progress Notes (Signed)
Initial visit for emotional and spiritual support.

## 2014-04-27 NOTE — Discharge Summary (Signed)
Physician Discharge Summary  Theresa Washington QIO:[id number] DOB: [birth year] DOA: 04/26/2014  PCP: Odette Fraction, MD  Admit date: 04/26/2014 Discharge date: 04/27/2014  Time spent: 40 minutes  Recommendations for Outpatient Follow-up:  1. Followup with primary care physician within one week.  Discharge Diagnoses:  Active Problems:   Fibromyalgia   Von Willebrand disease   Pre-syncope   Near syncope   Chest pain   Palpitations   Discharge Condition: Stable  Diet recommendation: Heart healthy  Filed Weights   04/26/14 1650 04/26/14 2125  Weight: 72.576 kg (160 lb) 72.576 kg (160 lb)    History of present illness:  75 female who has a past medical history of Von Willebrand disease; Seizures; Bell's palsy; Back pain; Degenerative disc disease; DDD (degenerative disc disease); Spondylosis; Fibromyalgia; DDD (degenerative disc disease); Ankylosing spondylitis; Fibromyalgia; PVC (premature ventricular contraction); Tachycardia; Anemia; Anxiety; Kidney stone; Myositis; and Lyme arthritis.  Presents to the ED with chief complaint of chest pain associated with diaphoresis, facial redness lightheadedness this morning while she was doing dishes. After the patient went to the bedroom and her husband note is that patient had redness on the face. Patient did not pass out though she has had recent several episodes of syncope and is currently wearing a Holter monitor. Cardiology will follow the patient on Wednesday to evaluate the Holter monitor.  She denies nausea vomiting or diarrhea. Denies passing out. The chest pain is intermittent and felt like a pressure on the left side. She denies shortness of breath, patient does have a history of panic disorder and has had panic attacks, palpitations. Today also patient had palpitations. She also has a history of fibromyalgia, pseudoseizures.  Hospital Course:   Presyncope  -Presented to the hospital with presyncope, denies any syncopal-like  episode while she is in the hospital.  -Has Holter monitoring for similar previous episodes, reviewed by cardiology.  -No evidence of arrhythmias, patient has sinus rhythm with heart rate between 70 and 120.  -Continued telemetry monitoring.  -Normal orthostatic vitals.  -Patient is on Valium, Flexeril, Percocet and Lyrica, I wonder if these medication causes some dizziness.  -Blood pressure is okay, no bradycardia as patient is on Bystolic 5 mg Daily.  -Discussed with cardiology, no further testing, followup as outpatient if this happened again she might need referral to neurology as outpatient.  Fibromyalgia  -Patient is on Valium Flexeril, Percocet and Lyrica.  -Continue home medications.   Palpitations  -As mentioned above no evidence of arrhythmias, patient is on Bystolic 5 mg, no evidence of cardiac.   Hypokalemia  -Repleted with oral supplements, likely secondary to diuretics. -Even with potassium level of 2.7, no evidence of arrhythmias.   Procedures:  None  Consultations:  Cards  Discharge Exam: Filed Vitals:   04/27/14 1351  BP: 101/66  Pulse: 76  Temp: 97 F (36.1 C)  Resp: 20   General: Alert and awake, oriented x3, not in any acute distress. HEENT: anicteric sclera, pupils reactive to light and accommodation, EOMI CVS: S1-S2 clear, no murmur rubs or gallops Chest: clear to auscultation bilaterally, no wheezing, rales or rhonchi Abdomen: soft nontender, nondistended, normal bowel sounds, no organomegaly Extremities: no cyanosis, clubbing or edema noted bilaterally Neuro: Cranial nerves II-XII intact, no focal neurological deficits  Discharge Instructions You were cared for by a hospitalist during your hospital stay. If you have any questions about your discharge medications or the care you received while you were in the hospital after you are discharged, you can call the  unit and asked to speak with the hospitalist on call if the hospitalist that took care  of you is not available. Once you are discharged, your primary care physician will handle any further medical issues. Please note that NO REFILLS for any discharge medications will be authorized once you are discharged, as it is imperative that you return to your primary care physician (or establish a relationship with a primary care physician if you do not have one) for your aftercare needs so that they can reassess your need for medications and monitor your lab values.  Discharge Instructions   Diet - low sodium heart healthy    Complete by:  As directed      Increase activity slowly    Complete by:  As directed             Medication List         clobetasol ointment 0.05 %  Commonly known as:  TEMOVATE  Apply 1 application topically daily as needed (for rash).     cyclobenzaprine 10 MG tablet  Commonly known as:  FLEXERIL  Take 1 tablet (10 mg total) by mouth 3 (three) times daily as needed. For muscle spasms     diazepam 5 MG tablet  Commonly known as:  VALIUM  Take 1 tablet (5 mg total) by mouth every 12 (twelve) hours as needed for anxiety. For muscle spasms     diclofenac sodium 1 % Gel  Commonly known as:  VOLTAREN  Apply 1 application topically daily as needed. For pain     etonogestrel-ethinyl estradiol 0.12-0.015 MG/24HR vaginal ring  Commonly known as:  Bee 1 each vaginally every 28 (twenty-eight) days. Insert vaginally and leave in place for 3 consecutive weeks, then remove for 1 week.     fluticasone 50 MCG/ACT nasal spray  Commonly known as:  FLONASE  Place 2 sprays into both nostrils daily.     GOODY PM PO  Take 1 packet by mouth at bedtime as needed (for pain/sleep).     hydrochlorothiazide 25 MG tablet  Commonly known as:  HYDRODIURIL  Take 1 tablet (25 mg total) by mouth daily.     ibuprofen 800 MG tablet  Commonly known as:  ADVIL,MOTRIN  Take 800 mg by mouth every 8 (eight) hours as needed.     methylcellulose 1 % ophthalmic solution   Commonly known as:  ARTIFICIAL TEARS  Place 1 drop into both eyes daily as needed. Dry Eyes     Milnacipran 50 MG Tabs tablet  Commonly known as:  SAVELLA  Take 1 tablet (50 mg total) by mouth 2 (two) times daily.     nebivolol 5 MG tablet  Commonly known as:  BYSTOLIC  Take 1 tablet (5 mg total) by mouth daily.     oxyCODONE-acetaminophen 10-325 MG per tablet  Commonly known as:  PERCOCET  Take 2 tablets by mouth every 8 (eight) hours as needed for pain.     pregabalin 75 MG capsule  Commonly known as:  LYRICA  Take 1 capsule (75 mg total) by mouth 2 (two) times daily.       Allergies  Allergen Reactions  . Shrimp [Shellfish Allergy] Anaphylaxis, Hives and Swelling  . Other     Pt states "I carry an Epipen. I had anaphylactic reaction to something but I don't know what".        Follow-up Information   Follow up with Odette Fraction, MD.   Specialty:  Family Medicine  Contact information:   [address] [place] [address] [phone]        The results of significant diagnostics from this hospitalization (including imaging, microbiology, ancillary and laboratory) are listed below for reference.    Significant Diagnostic Studies: Dg Chest 2 View  04/26/2014   CLINICAL DATA:  Chest pain and pressure.  EXAM: CHEST  2 VIEW  COMPARISON:  09/24/2012.  FINDINGS: The heart size and mediastinal contours are within normal limits. Both lungs are clear. The visualized skeletal structures are unremarkable.  IMPRESSION: No active cardiopulmonary disease.  Stable chest.   Electronically Signed   By: Rolla Flatten M.D.   On: 04/26/2014 19:24    Microbiology: No results found for this or any previous visit (from the past 240 hour(s)).   Labs: Basic Metabolic Panel:  Recent Labs Lab 04/26/14 1810 04/27/14 0238  NA 139 137  K 2.7* 3.5*  CL 96 96  CO2 28 27  GLUCOSE 107* 159*  BUN 10 9  CREATININE 0.73 0.69  CALCIUM 9.2 8.8   Liver Function  Tests:  Recent Labs Lab 04/27/14 0238  AST 11  ALT 17  ALKPHOS 91  BILITOT 0.2*  PROT 6.7  ALBUMIN 2.8*   No results found for this basename: LIPASE, AMYLASE,  in the last 168 hours No results found for this basename: AMMONIA,  in the last 168 hours CBC:  Recent Labs Lab 04/26/14 1810 04/27/14 0238  WBC 13.6* 13.0*  NEUTROABS 10.0*  --   HGB 12.7 12.4  HCT 36.9 36.5  MCV 89.3 90.6  PLT 406* 368   Cardiac Enzymes:  Recent Labs Lab 04/26/14 1810 04/26/14 2122 04/27/14 0238 04/27/14 0902  TROPONINI <0.30 <0.30 <0.30 <0.30   BNP: BNP (last 3 results) No results found for this basename: PROBNP,  in the last 8760 hours CBG: No results found for this basename: GLUCAP,  in the last 168 hours     Signed:  Aroush Chasse A  [facility] 04/27/2014, 5:25 PM

## 2014-04-27 NOTE — Progress Notes (Signed)
Holter monitor report read by Dr. Domenic Polite on 04/27/2014, removed from patient during this hospitalization, reveals that her rhythm is sinus with rates between 65 and 135 beats per minute. Rare PAC and PVC. No sustained arrhythmia or pauses. Sinus tachycardia was noted. Symptoms of fast heart rate include dizziness sweatiness chest pain generally associated with sinus tachycardia, but no specific arrhythmias.

## 2014-04-27 NOTE — Care Management Note (Signed)
    Page 1 of 1   04/27/2014     1:27:51 PM CARE MANAGEMENT NOTE 04/27/2014  Patient:  Theresa Washington, Theresa Washington   Account Number:  [id number]  Date Initiated:  04/27/2014  Documentation initiated by:  Theophilus Kinds  Subjective/Objective Assessment:   Pt admitted from home with CP and syncope. Pt lives with her husband and children and will return home at discharge. Pt is indepdent with ADL's.     Action/Plan:   No CM needs noted.   Anticipated DC Date:  04/28/2014   Anticipated DC Plan:  [place]  CM consult      Choice offered to / List presented to:             Status of service:  Completed, signed off Medicare Important Message given?   (If response is "NO", the following Medicare IM given date fields will be blank) Date Medicare IM given:   Date Additional Medicare IM given:    Discharge Disposition:  HOME/SELF CARE  Per UR Regulation:    If discussed at Long Length of Stay Meetings, dates discussed:    Comments:  04/27/14 [place], RN BSN CM

## 2014-04-27 NOTE — Progress Notes (Signed)
  Echocardiogram 2D Echocardiogram has been performed.  [place], [place] 04/27/2014, 12:28 PM

## 2014-04-27 NOTE — Consult Note (Addendum)
CARDIOLOGY CONSULT NOTE   Patient ID: CELENA LANIUS MRN: [id number] DOB/AGE: Dec 27, 1980 33 y.o.  Admit Date: 04/26/2014 Referring Physician:PTH Primary Physician: Odette Fraction, MD Consulting Cardiologist: Rozann Lesches Primary Cardiologist: Dorris Carnes - formerly Dr. Mathis Bud with [facility] Reason for Consultation: Palpitations and chest pain  Clinical Summary Ms. Bakula is a 33 y.o.female with multiple medical problems to include von Willebrand disease, seizures, Bell's palsy, degenerative disc disease, ankylosing spondylitis, fibromyalgia, recurrent palpitations, anxiety and anemia. She presented to the emergency room after complaints of a feeling of neck and facial redness and heat associated with diaphoresis, secondarily a feeling of rapid palpitations, also dizziness that occurred while she was standing doing the dishes. She had a feeling of chest pain associated with this, actually had 2 distinct episodes and states that they were the worst that she has had in a long time. Records indicate a long-standing, multiyear history of recurring palpitations without definitive diagnosis of arrhythmia. She has been following with her primary care provider Dr. Dennard Schaumann, and in fact just recently a 48-hour Holter monitor had been placed on 04/25/2014 for further evaluation. She is scheduled to be seen by Dr. Harrington Challenger on 05/02/2014 to discuss results.   She states she has been having dizzy spells of tachycardia palpitations since age of 42. She also has been having episodes of flushing and weakness since that time as well. She has been seen by neurology, EP with a negative tilt table test, has had a Holter monitor placed in the past. All of which have been found to be negative for distinct arrhythmia.  Most recent event monitor done in April 2014, read by Dr. Mare Ferrari, showed sinus rhythm and sinus tachycardia.  On arrival to the emergency room the patient's  blood pressure is 135/79, heart 96 respirations 20 temperature 99.1. She had no elevated white blood cells of 3.6, she was not found to be anemic, potassium was found to be very low at 2.7, with a glucose of 107. Cardiac enzymes are found be negative. Chest x-ray was found to be negative for CHF or active cardiopulmonary disease. She was treated with potassium replacement 40 mEq by mouth and admitted for observation.   Allergies  Allergen Reactions  . Shrimp [Shellfish Allergy] Anaphylaxis, Hives and Swelling  . Other     Pt states "I carry an Epipen. I had anaphylactic reaction to something but I don't know what".     Medications Scheduled Medications: . fluticasone  2 spray Each Nare Daily  . hydrochlorothiazide  25 mg Oral Daily  . Milnacipran  50 mg Oral BID  . nebivolol  5 mg Oral Daily  . pregabalin  75 mg Oral BID  . sodium chloride  3 mL Intravenous Q12H    PRN Medications: sodium chloride, cyclobenzaprine, diazepam, ondansetron (ZOFRAN) IV, ondansetron, oxyCODONE-acetaminophen, sodium chloride   Past Medical History  Diagnosis Date  . Von Willebrand disease   . Seizures   . Bell's palsy   . Back pain   . Degenerative disc disease   . DDD (degenerative disc disease)   . Spondylosis   . Fibromyalgia   . DDD (degenerative disc disease)   . Ankylosing spondylitis   . Fibromyalgia   . PVC (premature ventricular contraction)   . Palpitations   . Anemia   . Anxiety   . Kidney stone   . Myositis   . Lyme arthritis     Past Surgical History  Procedure Laterality Date  . Oophorectomy  Left side    Family History  Problem Relation Age of Onset  . Heart disease Father   . Kidney disease Father   . Alcohol abuse Brother   . Mental retardation Brother   . Colon cancer Maternal Grandmother     Social History Ms. Yamamoto reports that she has been smoking Cigarettes.  She has a 12 pack-year smoking history. She has never used smokeless tobacco. Ms. Ureste  reports that she does not drink alcohol.  Review of Systems Otherwise reviewed and negative except as outlined.  Physical Examination Blood pressure 110/76, pulse 86, temperature 97.6 F (36.4 C), temperature source Oral, resp. rate 20, height 4' 9.25" (1.454 m), weight 160 lb (72.576 kg), last menstrual period 04/26/2002, SpO2 97.00%.  Intake/Output Summary (Last 24 hours) at 04/27/14 0856 Last data filed at 04/27/14 0700  Gross per 24 hour  Intake      0 ml  Output      1 ml  Net     -1 ml    Telemetry: NSR rate of 88 bpm with occasional PVCs.  GEN: No acute distress, resting comfortably in bed HEENT: Conjunctiva and lids normal, oropharynx clear with moist mucosa. Neck: Supple, no elevated JVP or carotid bruits, no thyromegaly. Lungs: Clear to auscultation, nonlabored breathing at rest. Cardiac: Regular rate and rhythm, no S3 or significant systolic murmur, no pericardial rub. Abdomen: Soft, nontender, no hepatomegaly, bowel sounds present, no guarding or rebound. Extremities: No pitting edema, distal pulses 2+. Skin: Warm and dry. Musculoskeletal: No kyphosis. Neuropsychiatric: Alert and oriented x3, affect grossly appropriate.  Prior Cardiac Testing/Procedures 1. Tilt table 03/07/2006 RESULTS: This demonstrates a negative head-up tilt table test for inducible  syncope. The patient tolerated the procedure well. She will be observed  expectantly.  2. Echocardiogram 2006 2-D AND DOPPLER IMAGING: The left ventricle is normal size with normal  systolic function. There are no wall motion abnormalities seen. The  estimated ejection fraction is 55 to 60%.  The right ventricle is normal size with normal systolic function. There is  a prominent moderator band.  The atria are both normal size. The atrial septum is intact to color.  The aortic valve is morphologically unremarkable with no stenosis or  regurgitation.  The mitral valve is morphologically unremarkable with no  stenosis or  regurgitation.  The tricuspid valve is morphologically unremarkable with trace  insufficiency, no stenosis is seen.  The pulmonic valve is morphologically unremarkable with no stenosis or  regurgitation.  There is no pericardial effusion.  The inferior vena cava is normal size.  The ascending aorta is normal to the limits of the study.   Lab Results  Basic Metabolic Panel:  Recent Labs Lab 04/26/14 1810 04/27/14 0238  NA 139 137  K 2.7* 3.5*  CL 96 96  CO2 28 27  GLUCOSE 107* 159*  BUN 10 9  CREATININE 0.73 0.69  CALCIUM 9.2 8.8    Liver Function Tests:  Recent Labs Lab 04/27/14 0238  AST 11  ALT 17  ALKPHOS 91  BILITOT 0.2*  PROT 6.7  ALBUMIN 2.8*    CBC:  Recent Labs Lab 04/26/14 1810 04/27/14 0238  WBC 13.6* 13.0*  NEUTROABS 10.0*  --   HGB 12.7 12.4  HCT 36.9 36.5  MCV 89.3 90.6  PLT 406* 368    Cardiac Enzymes:  Recent Labs Lab 04/26/14 1810 04/26/14 2122 04/27/14 0238  TROPONINI <0.30 <0.30 <0.30    Radiology: Dg Chest 2 View  04/26/2014  CLINICAL DATA:  Chest pain and pressure.  EXAM: CHEST  2 VIEW  COMPARISON:  09/24/2012.  FINDINGS: The heart size and mediastinal contours are within normal limits. Both lungs are clear. The visualized skeletal structures are unremarkable.  IMPRESSION: No active cardiopulmonary disease.  Stable chest.   Electronically Signed   By: Rolla Flatten M.D.   On: 04/26/2014 19:24    ECG: Normal sinus rhythm with T wave inversion noted laterally, with Q waves noted inferiorly.   Impression and Recommendations  1. Hypokalemia: On arrival the patient's potassium was 2.7 which was repleted and has increased to 3.5 this a.m. Potentially could be at least contributing to her symptoms of palpitations and weakness. She is on HCTZ 25 mg at home daily without potassium replacement. We will give another dose of potassium this a.m. to keep potassium at the minimum of 4.0.  2. Frequent Palpitations:  Long-standing history, reportedly her most recent episodes were more intense than normal. She did not have syncope. Holter monitor recently placed. I am having this removed today if it is been on for approximately 48 hours. This will be downloaded and evaluated. We will get echocardiogram for evaluation. Most recent echocardiogram was completed in 2006 and was found to be normal at that time. Cardiac enzymes are found be negative ruling out ACS. Telemetry under observation shows sinus rhythm. We will not plan a stress test at this time.  3. History of pseudoseizures: She has been followed by neurology at [facility], and also has been seen by a psychoneurologist through Dr. Samella Parr referral. She states that she has had no seizure activity for several years.  Signed: Phill Myron. Lawrence NP  04/27/2014, 8:56 AM Co-Sign MD   Attending note:  Patient seen and examined. Reviewed records and modified above note by Ms. Lawrence NP. Ms. Scarbrough presents with episodes of facial flushing and warmth followed by rapid palpitations, also chest pain, diaphoresis, and a feeling of dizziness. These episodes occurred recently while standing, more intense than her typical symptoms of palpitations which have been present in an intermittent pattern for many years. She has had prior cardiology workup including negative tilt table test, no documentation of structural heart disease by echocardiogram, and also previous cardiac monitors demonstrating no distinct arrhythmias. Additional neurological history as detailed above. Her cardiac markers are normal, ECG shows no acute ST segment changes or arrhythmias, telemetry shows no arrhythmias. She has had a recent Holter monitor in place as ordered by Dr. Dennard Schaumann, this is being removed for interrogation since she states that she was wearing it at time that she had her symptoms prompting evaluation in the hospital. Replete potassium, she will need to have a potassium  supplement at home since she is on HCTZ. Followup echocardiogram also being obtained. If these studies are again unrevealing, doubt that further cardiac workup will be necessary.  Satira Sark, M.D., F.A.C.C.

## 2014-04-27 NOTE — Progress Notes (Addendum)
[facility] PROGRESS NOTE   Theresa Washington GMW:[id number] DOB: 06-18-1981 DOA: 04/26/2014 PCP: Odette Fraction, MD  HPI/Subjective: Seen with husband at bedside, eating her breakfast.  Assessment/Plan: Active Problems:   Fibromyalgia   Von Willebrand disease   Pre-syncope   Near syncope   Chest pain   Palpitations   Presyncope -Presented to the hospital with presyncope, denies any syncopal-like episode while she is in the hospital. -Has Holter monitoring for similar previous episodes, reviewed by cardiology. -No evidence of arrhythmias, patient has sinus rhythm with heart rate between 70 and 120. -Continued telemetry monitoring. -Normal orthostatic vitals. -Patient is on Valium, Flexeril, Percocet and Lyrica, I wonder if these medication causes any type of dizziness. -Blood pressure is okay, no bradycardia as patient is on Bystolic 5 mg  Daily.  Fibromyalgia -Patient is on Valium Flexeril, Percocet and Lyrica. -Continue home medications.  Palpitations -As mentioned above no evidence of arrhythmias, patient is on Bystolic 5 mg, no evidence of cardiac.  Hypokalemia -Replete with oral supplements, likely secondary to diuretics.  DVT prophylaxis -Early ambulation, no pharmaceutical/mechanical prophylaxis.  Code Status: Full code Family Communication: Plan discussed with the patient. Disposition Plan: Remains inpatient   Consultants:  Cardiology  Procedures:  None  Antibiotics:  None   Objective: Filed Vitals:   04/27/14 0649  Temp: 97.6 F (36.4 C)  Resp:     Intake/Output Summary (Last 24 hours) at 04/27/14 1347 Last data filed at 04/27/14 0800  Gross per 24 hour  Intake    240 ml  Output      1 ml  Net    239 ml   Filed Weights   04/26/14 1650 04/26/14 2125  Weight: 72.576 kg (160 lb) 72.576 kg (160 lb)    Exam: General: Alert and awake, oriented x3, not in any acute distress. HEENT: anicteric sclera, pupils reactive to  light and accommodation, EOMI CVS: S1-S2 clear, no murmur rubs or gallops Chest: clear to auscultation bilaterally, no wheezing, rales or rhonchi Abdomen: soft nontender, nondistended, normal bowel sounds, no organomegaly Extremities: no cyanosis, clubbing or edema noted bilaterally Neuro: Cranial nerves II-XII intact, no focal neurological deficits  Data Reviewed: Basic Metabolic Panel:  Recent Labs Lab 04/26/14 1810 04/27/14 0238  NA 139 137  K 2.7* 3.5*  CL 96 96  CO2 28 27  GLUCOSE 107* 159*  BUN 10 9  CREATININE 0.73 0.69  CALCIUM 9.2 8.8   Liver Function Tests:  Recent Labs Lab 04/27/14 0238  AST 11  ALT 17  ALKPHOS 91  BILITOT 0.2*  PROT 6.7  ALBUMIN 2.8*   No results found for this basename: LIPASE, AMYLASE,  in the last 168 hours No results found for this basename: AMMONIA,  in the last 168 hours CBC:  Recent Labs Lab 04/26/14 1810 04/27/14 0238  WBC 13.6* 13.0*  NEUTROABS 10.0*  --   HGB 12.7 12.4  HCT 36.9 36.5  MCV 89.3 90.6  PLT 406* 368   Cardiac Enzymes:  Recent Labs Lab 04/26/14 1810 04/26/14 2122 04/27/14 0238 04/27/14 0902  TROPONINI <0.30 <0.30 <0.30 <0.30   BNP (last 3 results) No results found for this basename: PROBNP,  in the last 8760 hours CBG: No results found for this basename: GLUCAP,  in the last 168 hours  Micro No results found for this or any previous visit (from the past 240 hour(s)).   Studies: Dg Chest 2 View  04/26/2014   CLINICAL DATA:  Chest pain and pressure.  EXAM: CHEST  2 VIEW  COMPARISON:  09/24/2012.  FINDINGS: The heart size and mediastinal contours are within normal limits. Both lungs are clear. The visualized skeletal structures are unremarkable.  IMPRESSION: No active cardiopulmonary disease.  Stable chest.   Electronically Signed   By: Rolla Flatten M.D.   On: 04/26/2014 19:24    Scheduled Meds: . fluticasone  2 spray Each Nare Daily  . hydrochlorothiazide  25 mg Oral Daily  . Milnacipran  50 mg  Oral BID  . nebivolol  5 mg Oral Daily  . pregabalin  75 mg Oral BID  . sodium chloride  3 mL Intravenous Q12H   Continuous Infusions:      Time spent: 35 minutes    [facility] A  [facility] Pager [phone] If 7PM-7AM, please contact night-coverage at [email or web address], password [facility] 04/27/2014, 1:47 PM  LOS: 1 day

## 2014-04-27 NOTE — Progress Notes (Signed)
Patient states understanding of discharge instructions.  

## 2014-04-27 NOTE — Progress Notes (Signed)
UR completed 

## 2014-04-29 ENCOUNTER — Telehealth: Payer: Self-pay | Admitting: Internal Medicine

## 2014-04-29 NOTE — Telephone Encounter (Signed)
Patient would like to speak with nurse regarding problems she is still having.  Please return call.  Advised patient that we may not be able to help since she has not been seen here as patient yet. / tgs

## 2014-04-29 NOTE — Telephone Encounter (Signed)
Patient and I discussed recent ED visit,we will see her in fu in 3 days.She has many questions about her care which I have directed her to ask Dr.Ross at visit.For now she will stay inside out of the heat,rest when fatigued. Patient agreed with dispo    C.Wilber Oliphant RN

## 2014-05-02 ENCOUNTER — Encounter: Payer: Self-pay | Admitting: Internal Medicine

## 2014-05-02 ENCOUNTER — Ambulatory Visit (INDEPENDENT_AMBULATORY_CARE_PROVIDER_SITE_OTHER): Payer: 59 | Admitting: Internal Medicine

## 2014-05-02 VITALS — BP 112/78 | HR 94 | Ht <= 58 in | Wt 168.0 lb

## 2014-05-02 DIAGNOSIS — R002 Palpitations: Secondary | ICD-10-CM

## 2014-05-02 DIAGNOSIS — R079 Chest pain, unspecified: Secondary | ICD-10-CM

## 2014-05-02 LAB — BASIC METABOLIC PANEL
BUN: 10 mg/dL (ref 6–23)
CHLORIDE: 98 meq/L (ref 96–112)
CO2: 32 mEq/L (ref 19–32)
Calcium: 9.5 mg/dL (ref 8.4–10.5)
Creat: 0.72 mg/dL (ref 0.50–1.10)
GLUCOSE: 77 mg/dL (ref 70–99)
POTASSIUM: 3 meq/L — AB (ref 3.5–5.3)
Sodium: 139 mEq/L (ref 135–145)

## 2014-05-02 NOTE — Patient Instructions (Signed)
Your physician recommends that you schedule a follow-up appointment to be determined.  Your physician recommends that you return for lab work today to get BMP

## 2014-05-02 NOTE — Progress Notes (Signed)
HPI Patient is a 33 yo who was just discharged from [facility] She was just seen by [facility] for evaluation of presyncope/syncope while she was hospitalized  She has a history of von Willebrand disease, seizures, Bell's palsy, degenerative disc disease, ankylosing spondylitis, fibromyalgia, recurrent palpitations, anxiety and anemia.  She presented to the emergency room after complaints of a feeling of neck and facial redness and heat associated with diaphoresis, secondarily a feeling of rapid palpitations, also dizziness that occurred while she was standing doing the dishes. She had a feeling of chest pain associated with this, actually had 2 distinct episodes and states that they were the worst that she has had in a long time. Records indicate a long-standing, multiyear history of recurring palpitations without definitive diagnosis of arrhythmia. She has been following with her primary care provider Dr. Dennard Schaumann, and in fact just recently a 48-hour Holter monitor had been placed on 04/25/2014 for further evaluation   She states she has been having dizzy spells of tachycardia palpitations since age of 68. She also has been having episodes of flushing and weakness since that time as well. She has been seen by neurology, EP with a negative tilt table test, has had a Holter monitor placed in the past. All of which have been found to be negative for distinct arrhythmia.  Most recent event monitor done in April 2014, read by Dr. Mare Ferrari, showed sinus rhythm and sinus tachycardia.   In the hospital she had no arrhythmia.  Her K was 2.7 on admit and this was repleted.    Since d/c the patient says she overall has been tired, felt week No syncopal spells.  Notes occasional flushed sensation, reddening in face.   SOme chest pain.  Substernal  Pleuritic   Allergies  Allergen Reactions  . Shrimp [Shellfish Allergy] Anaphylaxis, Hives and Swelling  . Other     Pt states "I carry an Epipen. I had anaphylactic  reaction to something but I don't know what".     Current Outpatient Prescriptions  Medication Sig Dispense Refill  . clobetasol ointment (TEMOVATE) 4.58 % Apply 1 application topically daily as needed (for rash).       . cyclobenzaprine (FLEXERIL) 10 MG tablet Take 1 tablet (10 mg total) by mouth 3 (three) times daily as needed. For muscle spasms  90 tablet  3  . diazepam (VALIUM) 5 MG tablet Take 1 tablet (5 mg total) by mouth every 12 (twelve) hours as needed for anxiety. For muscle spasms  60 tablet  2  . diclofenac sodium (VOLTAREN) 1 % GEL Apply 1 application topically daily as needed. For pain      . Diphenhydramine-APAP, sleep, (GOODY PM PO) Take 1 packet by mouth at bedtime as needed (for pain/sleep).      [name] etonogestrel-ethinyl estradiol (NUVARING) 0.12-0.015 MG/24HR vaginal ring Place 1 each vaginally every 28 (twenty-eight) days. Insert vaginally and leave in place for 3 consecutive weeks, then remove for 1 week.       . fluticasone (FLONASE) 50 MCG/ACT nasal spray Place 2 sprays into both nostrils daily.  16 g  2  . hydrochlorothiazide (HYDRODIURIL) 25 MG tablet Take 1 tablet (25 mg total) by mouth daily.  30 tablet  5  . ibuprofen (ADVIL,MOTRIN) 800 MG tablet Take 800 mg by mouth every 8 (eight) hours as needed.      . methylcellulose (ARTIFICIAL TEARS) 1 % ophthalmic solution Place 1 drop into both eyes daily as needed. Dry Eyes      .  Milnacipran (SAVELLA) 50 MG TABS tablet Take 1 tablet (50 mg total) by mouth 2 (two) times daily.  60 tablet  3  . nebivolol (BYSTOLIC) 5 MG tablet Take 1 tablet (5 mg total) by mouth daily.  30 tablet  5  . oxyCODONE-acetaminophen (PERCOCET) 10-325 MG per tablet Take 2 tablets by mouth every 8 (eight) hours as needed for pain.      . pregabalin (LYRICA) 75 MG capsule Take 1 capsule (75 mg total) by mouth 2 (two) times daily.  60 capsule  5   No current facility-administered medications for this visit.    Past Medical History  Diagnosis Date  .  Von Willebrand disease   . Seizures   . Bell's palsy   . Back pain   . Degenerative disc disease   . DDD (degenerative disc disease)   . Spondylosis   . Fibromyalgia   . DDD (degenerative disc disease)   . Ankylosing spondylitis   . Fibromyalgia   . PVC (premature ventricular contraction)   . Palpitations   . Anemia   . Anxiety   . Kidney stone   . Myositis   . Lyme arthritis     Past Surgical History  Procedure Laterality Date  . Oophorectomy      Left side    Family History  Problem Relation Age of Onset  . Heart disease Father   . Kidney disease Father   . Alcohol abuse Brother   . Mental retardation Brother   . Colon cancer Maternal Grandmother     History   Social History  . Marital Status: Married    Spouse Name: N/A    Number of Children: N/A  . Years of Education: N/A   Occupational History  . Not on file.   Social History Main Topics  . Smoking status: Current Every Day Smoker -- 1.00 packs/day for 12 years    Types: Cigarettes  . Smokeless tobacco: Never Used  . Alcohol Use: No  . Drug Use: No  . Sexual Activity: Yes    Birth Control/ Protection: Inserts   Other Topics Concern  . Not on file   Social History Narrative  . No narrative on file    Review of Systems:  All systems reviewed.  They are negative to the above problem except as previously stated.  Vital Signs: BP 112/78  Pulse 94  Ht 4\' 9"  (1.448 m)  Wt 168 lb (76.204 kg)  BMI 36.34 kg/m2  SpO2 99%  LMP 04/26/2002  Physical Exam Pat[place] is in NAD HEENT:  Normocephalic, atraumatic. EOMI, PERRLA.  Neck: JVP is normal.  No bruits.  Lungs: clear to auscultation. No rales no wheezes.  Heart: Regular rate and rhythm. Normal S1, S2. No S3.   No significant murmurs. PMI not displaced.  Abdomen:  Supple, nontender. Normal bowel sounds. No masses. No hepatomegaly.  Extremities:   Good distal pulses throughout. No lower extremity edema.  Musculoskeletal :moving all extremities.   Neuro:   alert and oriented x3.  CN II-XII grossly intact.   Assessment and Plan:  1.  Chest pain  Appears to be muscular.  Rest, NSAIDS  2. Presyncope/syncope.  Work up so far negative.  WIll review monitor  Review with EP  Patient does not have a definite follow up in neuro  3.  Hypokalemia  WIll check BMET.

## 2014-05-03 ENCOUNTER — Encounter: Payer: Self-pay | Admitting: Family Medicine

## 2014-05-05 ENCOUNTER — Encounter: Payer: Self-pay | Admitting: *Deleted

## 2014-05-05 ENCOUNTER — Telehealth: Payer: Self-pay | Admitting: *Deleted

## 2014-05-05 ENCOUNTER — Other Ambulatory Visit: Payer: Self-pay | Admitting: *Deleted

## 2014-05-05 ENCOUNTER — Other Ambulatory Visit: Payer: Self-pay | Admitting: Family Medicine

## 2014-05-05 DIAGNOSIS — E876 Hypokalemia: Secondary | ICD-10-CM

## 2014-05-05 MED ORDER — MILNACIPRAN HCL 50 MG PO TABS: 50.0000 mg | ORAL_TABLET | Freq: Two times a day (BID) | ORAL | Status: DC

## 2014-05-05 MED ORDER — POTASSIUM CHLORIDE ER 20 MEQ PO TBCR: 20.0000 meq | EXTENDED_RELEASE_TABLET | Freq: Every day | ORAL | Status: DC

## 2014-05-05 NOTE — Telephone Encounter (Signed)
Rx Refilled  

## 2014-05-05 NOTE — Telephone Encounter (Signed)
Attempted all contact numbers in contact list for pt. Including father in law but stated I had the wrong number. Called the pharmacy to see if they had another contact number and they had the same number as listed. Sent KCL to pharmacy, put in order for labs, mailed pt a letter.

## 2014-05-09 ENCOUNTER — Telehealth: Payer: Self-pay | Admitting: *Deleted

## 2014-05-09 NOTE — Telephone Encounter (Signed)
Letter mailed to pt by CMA on 6/18 as we could not reach pt.I corrected phone numbers in [place].I have sent Dr.Ross a staff message asking her to respond to patients question regarding need for loop recorder and follow up apt

## 2014-05-09 NOTE — Telephone Encounter (Signed)
Pt states she had labs done 05/02/14 and never heard results back. She did however pick up the RX for potassium from pharmacy that we called in. She was told at last visit that we would be getting back in touch with her about a follow up appt and she has not heard back from that either. She states Dr Harrington Challenger had mentioned something about a loop recorder.

## 2014-05-11 ENCOUNTER — Encounter: Payer: Self-pay | Admitting: Physician Assistant

## 2014-05-11 ENCOUNTER — Ambulatory Visit (INDEPENDENT_AMBULATORY_CARE_PROVIDER_SITE_OTHER): Payer: 59 | Admitting: Physician Assistant

## 2014-05-11 VITALS — BP 114/68 | HR 84 | Temp 97.7°F | Resp 18 | Wt 170.0 lb

## 2014-05-11 DIAGNOSIS — R300 Dysuria: Secondary | ICD-10-CM

## 2014-05-11 DIAGNOSIS — N390 Urinary tract infection, site not specified: Secondary | ICD-10-CM

## 2014-05-11 LAB — URINALYSIS, MICROSCOPIC ONLY
CASTS: NEGATIVE
Crystals: NONE SEEN

## 2014-05-11 LAB — URINALYSIS, ROUTINE W REFLEX MICROSCOPIC
Glucose, UA: NEGATIVE mg/dL
NITRITE: POSITIVE — AB
Protein, ur: >300 mg/dL — AB
Specific Gravity, Urine: >1.03 — ABNORMAL HIGH (ref 1.005–1.030)
UROBILINOGEN UA: 0.2 mg/dL (ref 0.0–1.0)
pH: 6 (ref 5.0–8.0)

## 2014-05-11 MED ORDER — NITROFURANTOIN MONOHYD MACRO 100 MG PO CAPS: 100.0000 mg | ORAL_CAPSULE | Freq: Two times a day (BID) | ORAL | Status: DC

## 2014-05-11 NOTE — Progress Notes (Signed)
Patient ID: MAILIN COGLIANESE MRN: [id number], DOB: 1980-12-04, 33 y.o. Date of Encounter: 05/11/2014, 11:09 AM    Chief Complaint:  Chief Complaint  Patient presents with  . c/o UTI    kidney pain, painful urination, just in hosp for cardiac issue  is seeing cardio     HPI: 33 y.o. year old white female reports that these symptoms started about 7 days ago but that today was the first tissues for an appointment. States that her urine has been malodorous. Also she has dysuria. Also is feeling some tenderness in her suprapubic area. Has had no fever or chills.     Home Meds:   Outpatient Prescriptions Prior to Visit  Medication Sig Dispense Refill  . clobetasol ointment (TEMOVATE) 4.00 % Apply 1 application topically daily as needed (for rash).       . cyclobenzaprine (FLEXERIL) 10 MG tablet Take 1 tablet (10 mg total) by mouth 3 (three) times daily as needed. For muscle spasms  90 tablet  3  . diazepam (VALIUM) 5 MG tablet Take 1 tablet (5 mg total) by mouth every 12 (twelve) hours as needed for anxiety. For muscle spasms  60 tablet  2  . diclofenac sodium (VOLTAREN) 1 % GEL Apply 1 application topically daily as needed. For pain      . Diphenhydramine-APAP, sleep, (GOODY PM PO) Take 1 packet by mouth at bedtime as needed (for pain/sleep).      [name] etonogestrel-ethinyl estradiol (NUVARING) 0.12-0.015 MG/24HR vaginal ring Place 1 each vaginally every 28 (twenty-eight) days. Insert vaginally and leave in place for 3 consecutive weeks, then remove for 1 week.       . fluticasone (FLONASE) 50 MCG/ACT nasal spray Place 2 sprays into both nostrils daily.  16 g  2  . hydrochlorothiazide (HYDRODIURIL) 25 MG tablet Take 1 tablet (25 mg total) by mouth daily.  30 tablet  5  . ibuprofen (ADVIL,MOTRIN) 800 MG tablet Take 800 mg by mouth every 8 (eight) hours as needed.      . methylcellulose (ARTIFICIAL TEARS) 1 % ophthalmic solution Place 1 drop into both eyes daily as needed. Dry Eyes      .  Milnacipran (SAVELLA) 50 MG TABS tablet Take 1 tablet (50 mg total) by mouth 2 (two) times daily.  60 tablet  5  . nebivolol (BYSTOLIC) 5 MG tablet Take 1 tablet (5 mg total) by mouth daily.  30 tablet  5  . oxyCODONE-acetaminophen (PERCOCET) 10-325 MG per tablet Take 2 tablets by mouth every 8 (eight) hours as needed for pain.      [name] Potassium Chloride ER 20 MEQ TBCR Take 20 mEq by mouth daily.  90 tablet  0  . pregabalin (LYRICA) 75 MG capsule Take 1 capsule (75 mg total) by mouth 2 (two) times daily.  60 capsule  5   No facility-administered medications prior to visit.    Allergies:  Allergies  Allergen Reactions  . Shrimp [Shellfish Allergy] Anaphylaxis, Hives and Swelling  . Other     Pt states "I carry an Epipen. I had anaphylactic reaction to something but I don't know what".       Review of Systems: See HPI for pertinent ROS. All other ROS negative.    Physical Exam: Blood pressure 114/68, pulse 84, temperature 97.7 F (36.5 C), temperature source Oral, resp. rate 18, weight 170 lb (77.111 kg), last menstrual period 04/26/2002., Body mass index is 36.78 kg/(m^2). General: WNWD WF.  Appears in no  acute distress. Neck: Supple. No thyromegaly. No lymphadenopathy. Lungs: Clear bilaterally to auscultation without wheezes, rales, or rhonchi. Breathing is unlabored. Heart: Regular rhythm. No murmurs, rubs, or gallops. Abdomen: Soft, non-distended with normoactive bowel sounds. No hepatomegaly. No rebound/guarding. No obvious abdominal masses. Minimal tenderness with palpation of suprapubic area. Msk:  Strength and tone normal for age. No tenderness with percussion of costophrenic angles bilaterally. Extremities/Skin: Warm and dry. Neuro: Alert and oriented X 3. Moves all extremities spontaneously. Gait is normal. CNII-XII grossly in tact. Psych:  Responds to questions appropriately with a normal affect.      ASSESSMENT AND PLAN:  33 y.o. year old female with  1. Urinary tract  infection without hematuria, site unspecified She is to take antibiotic as directed and complete all of it. If symptoms worsen significantly or if symptoms do not resolve at completion of antibiotic, then followup. - nitrofurantoin, macrocrystal-monohydrate, (MACROBID) 100 MG capsule; Take 1 capsule (100 mg total) by mouth 2 (two) times daily.  Dispense: 10 capsule; Refill: 0  2. Difficult or painful urination - Urinalysis, Routine w reflex microscopic   Signed, [address] [place], [place], [facility] 05/11/2014 11:09 AM

## 2014-05-27 LAB — BASIC METABOLIC PANEL
BUN: 13 mg/dL (ref 6–23)
CALCIUM: 8.9 mg/dL (ref 8.4–10.5)
CHLORIDE: 103 meq/L (ref 96–112)
CO2: 24 mEq/L (ref 19–32)
Creat: 0.78 mg/dL (ref 0.50–1.10)
GLUCOSE: 105 mg/dL — AB (ref 70–99)
Potassium: 3.6 mEq/L (ref 3.5–5.3)
Sodium: 136 mEq/L (ref 135–145)

## 2014-06-01 ENCOUNTER — Telehealth: Payer: Self-pay | Admitting: *Deleted

## 2014-06-01 NOTE — Telephone Encounter (Signed)
Pt wants lab results 

## 2014-06-01 NOTE — Telephone Encounter (Signed)
Made pt aware that labs are normal. Pt wanted to know if she could discontinue the potassium. I told her it was on the lower end of normal and that we would like her to continue. Pt wants to know if there is anything she needs to worry about, why it is staying on the lower side? Pt wants to know will there be a follow up appt made in the future.

## 2014-06-05 NOTE — Telephone Encounter (Signed)
Follow up in January 2016

## 2014-06-06 NOTE — Telephone Encounter (Signed)
Message copied by Bernita Raisin on Mon Jun 06, 2014  5:31 PM ------      Message from: Fay Records      Created: Sat Jun 04, 2014 10:59 PM      Regarding: RE: wants results        If patient has any further spells of syncope then would refer her to ep for Linq      ----- Message -----         From: Bernita Raisin, RN         Sent: 05/09/2014  12:30 PM           To: Fay Records, MD      Subject: wants results                                            Pt wants to hear back from you.She said you were talking about a loop recorder and also about a follow up apt.You wrote you needed to review halter.Dr.Pickards note to pt was monitor is normal.We had to send her a letter as we could not reach her by any of her listed phone numbers       ------

## 2014-06-06 NOTE — Telephone Encounter (Signed)
Pt has had 6-7 episodes of "passing out" for a few seconds since apt with Dr.Ross.Witnessed by spouse but never went to an ED.Will schedule her to see EP

## 2014-06-07 NOTE — Telephone Encounter (Signed)
Made apt to see EP at [address] [facility] 8/19 8:30 am Pt understands to go to ED if sx's worsen

## 2014-06-08 ENCOUNTER — Telehealth: Payer: Self-pay | Admitting: *Deleted

## 2014-06-08 NOTE — Telephone Encounter (Signed)
Informed of lab results

## 2014-06-14 ENCOUNTER — Telehealth: Payer: Self-pay | Admitting: Family Medicine

## 2014-06-14 MED ORDER — DIAZEPAM 5 MG PO TABS: 5.0000 mg | ORAL_TABLET | Freq: Two times a day (BID) | ORAL | Status: DC | PRN

## 2014-06-14 NOTE — Telephone Encounter (Signed)
Requesting a refill on Diazepam - ? OK to Refill

## 2014-06-14 NOTE — Telephone Encounter (Signed)
ok 

## 2014-06-14 NOTE — Telephone Encounter (Signed)
Med called to pharm 

## 2014-07-06 ENCOUNTER — Encounter: Payer: Self-pay | Admitting: Internal Medicine

## 2014-07-06 ENCOUNTER — Ambulatory Visit (INDEPENDENT_AMBULATORY_CARE_PROVIDER_SITE_OTHER): Payer: 59 | Admitting: Internal Medicine

## 2014-07-06 VITALS — BP 133/89 | HR 77 | Ht <= 58 in | Wt 174.0 lb

## 2014-07-06 DIAGNOSIS — R002 Palpitations: Secondary | ICD-10-CM

## 2014-07-06 DIAGNOSIS — R55 Syncope and collapse: Secondary | ICD-10-CM

## 2014-07-06 MED ORDER — HYDROCHLOROTHIAZIDE 25 MG PO TABS: 12.5000 mg | ORAL_TABLET | Freq: Every day | ORAL | Status: DC

## 2014-07-06 NOTE — Patient Instructions (Signed)
Your physician has recommended you make the following change in your medication:  1.) decrease hydrochlorothiazide to 12.5 mg daily  2.) minimize ibuprofen  Your physician recommends that you schedule a follow-up appointment in: 2 months with Dr. Harrington Challenger in [place].  Low-Sodium Eating Plan Sodium raises blood pressure and causes water to be held in the body. Getting less sodium from food will help lower your blood pressure, reduce any swelling, and protect your heart, liver, and kidneys. We get sodium by adding salt (sodium chloride) to food. Most of our sodium comes from canned, boxed, and frozen foods. Restaurant foods, fast foods, and pizza are also very high in sodium. Even if you take medicine to lower your blood pressure or to reduce fluid in your body, getting less sodium from your food is important. WHAT IS MY PLAN? Most people should limit their sodium intake to 2,300 mg a day. Your health care provider recommends that you limit your sodium intake to __________ a day.  WHAT DO I NEED TO KNOW ABOUT THIS EATING PLAN? For the low-sodium eating plan, you will follow these general guidelines:  Choose foods with a % Daily Value for sodium of less than 5% (as listed on the food label).   Use salt-free seasonings or herbs instead of table salt or sea salt.   Check with your health care provider or pharmacist before using salt substitutes.   Eat fresh foods.  Eat more vegetables and fruits.  Limit canned vegetables. If you do use them, rinse them well to decrease the sodium.   Limit cheese to 1 oz (28 g) per day.   Eat lower-sodium products, often labeled as "lower sodium" or "no salt added."  Avoid foods that contain monosodium glutamate (MSG). MSG is sometimes added to [place] food and some canned foods.  Check food labels (Nutrition Facts labels) on foods to learn how much sodium is in one serving.  Eat more home-cooked food and less restaurant, buffet, and fast  food.  When eating at a restaurant, ask that your food be prepared with less salt or none, if possible.  HOW DO I READ FOOD LABELS FOR SODIUM INFORMATION? The Nutrition Facts label lists the amount of sodium in one serving of the food. If you eat more than one serving, you must multiply the listed amount of sodium by the number of servings. Food labels may also identify foods as:  Sodium free--Less than 5 mg in a serving.  Very low sodium--35 mg or less in a serving.  Low sodium--140 mg or less in a serving.  Light in sodium--50% less sodium in a serving. For example, if a food that usually has 300 mg of sodium is changed to become light in sodium, it will have 150 mg of sodium.  Reduced sodium--25% less sodium in a serving. For example, if a food that usually has 400 mg of sodium is changed to reduced sodium, it will have 300 mg of sodium. WHAT FOODS CAN I EAT? Grains Low-sodium cereals, including oats, puffed wheat and rice, and shredded wheat cereals. Low-sodium crackers. Unsalted rice and pasta. Lower-sodium bread.  Vegetables Frozen or fresh vegetables. Low-sodium or reduced-sodium canned vegetables. Low-sodium or reduced-sodium tomato sauce and paste. Low-sodium or reduced-sodium tomato and vegetable juices.  Fruits Fresh, frozen, and canned fruit. Fruit juice.  Meat and Other Protein Products Low-sodium canned tuna and salmon. Fresh or frozen meat, poultry, seafood, and fish. Lamb. Unsalted nuts. Dried beans, peas, and lentils without added salt. Unsalted canned beans.  Homemade soups without salt. Eggs.  Dairy Milk. Soy milk. Ricotta cheese. Low-sodium or reduced-sodium cheeses. Yogurt.  Condiments Fresh and dried herbs and spices. Salt-free seasonings. Onion and garlic powders. Low-sodium varieties of mustard and ketchup. Lemon juice.  Fats and Oils Reduced-sodium salad dressings. Unsalted butter.  Other Unsalted popcorn and pretzels.  The items listed above may  not be a complete list of recommended foods or beverages. Contact your dietitian for more options. WHAT FOODS ARE NOT RECOMMENDED? Grains Instant hot cereals. Bread stuffing, pancake, and biscuit mixes. Croutons. Seasoned rice or pasta mixes. Noodle soup cups. Boxed or frozen macaroni and cheese. Self-rising flour. Regular salted crackers. Vegetables Regular canned vegetables. Regular canned tomato sauce and paste. Regular tomato and vegetable juices. Frozen vegetables in sauces. Salted french fries. Olives. Angie Fava. Relishes. Sauerkraut. Salsa. Meat and Other Protein Products Salted, canned, smoked, spiced, or pickled meats, seafood, or fish. Bacon, ham, sausage, hot dogs, corned beef, chipped beef, and packaged luncheon meats. Salt pork. Jerky. Pickled herring. Anchovies, regular canned tuna, and sardines. Salted nuts. Dairy Processed cheese and cheese spreads. Cheese curds. Blue cheese and cottage cheese. Buttermilk.  Condiments Onion and garlic salt, seasoned salt, table salt, and sea salt. Canned and packaged gravies. Worcestershire sauce. Tartar sauce. Barbecue sauce. Teriyaki sauce. Soy sauce, including reduced sodium. Steak sauce. Fish sauce. Oyster sauce. Cocktail sauce. Horseradish. Regular ketchup and mustard. Meat flavorings and tenderizers. Bouillon cubes. Hot sauce. Tabasco sauce. Marinades. Taco seasonings. Relishes. Fats and Oils Regular salad dressings. Salted butter. Margarine. Ghee. Bacon fat.  Other Potato and tortilla chips. Corn chips and puffs. Salted popcorn and pretzels. Canned or dried soups. Pizza. Frozen entrees and pot pies.  The items listed above may not be a complete list of foods and beverages to avoid. Contact your dietitian for more information. Document Released: 04/26/2002 Document Revised: 11/09/2013 Document Reviewed: 09/08/2013 [facility] Patient Information 2015 [place], [place]. This information is not intended to replace advice given to you by your  health care provider. Make sure you discuss any questions you have with your health care provider.

## 2014-07-07 NOTE — Progress Notes (Signed)
Primary Care Physician: Odette Fraction, MD Referring Physician:  Dr Hosie Poisson is a 33 y.o. female with a h/o multiple chronic medical issues who is referred by Dr Harrington Challenger for further EP evaluation.  She has a history of unsteadiness and dizziness.  She also has occasional palpitations.  She recently presented to the emergency room after complaints of a feeling of neck and facial redness and heat associated with diaphoresis, palpitations, and dizziness that occurred while she was standing doing the dishes.  She had not been standing very long.  She did not have full syncope with this event.  She was evaluated by Dr Domenic Polite and had an event monitor placed.  This did not document any sustained arrhythmias. She has issues with chronic pain for which she chronically takes high doses of NSAIDs.  She has developed associated edema and has thus been placed on hctz.  This has led to hypokalemia and she is not requiring K supplementation. She has been seen by neurology, EP (Dr Lovena Le several years ago) with a negative tilt table test, has had a Holter monitor placed in the past. All of which have been found to be negative for distinct arrhythmia.  SHe was recently evaluated by Dr Harrington Challenger and is referred to EP for further evaluation.  She has not had frank syncope.  She has not had any arrhythmias documented.   Past Medical History  Diagnosis Date  . Von Willebrand disease   . Seizures   . Bell's palsy   . Back pain   . Degenerative disc disease   . DDD (degenerative disc disease)   . Spondylosis   . Fibromyalgia   . DDD (degenerative disc disease)   . Ankylosing spondylitis   . Fibromyalgia   . PVC (premature ventricular contraction)   . Palpitations   . Anemia   . Anxiety   . Kidney stone   . Myositis   . Lyme arthritis    Past Surgical History  Procedure Laterality Date  . Oophorectomy      Left side    Current Outpatient Prescriptions  Medication Sig Dispense Refill    . clobetasol ointment (TEMOVATE) 6.37 % Apply 1 application topically daily as needed (for rash).       . cyclobenzaprine (FLEXERIL) 10 MG tablet Take 1 tablet (10 mg total) by mouth 3 (three) times daily as needed. For muscle spasms  90 tablet  3  . diazepam (VALIUM) 5 MG tablet Take 1 tablet (5 mg total) by mouth every 12 (twelve) hours as needed for anxiety. For muscle spasms  60 tablet  2  . diclofenac sodium (VOLTAREN) 1 % GEL Apply 1 application topically daily as needed. For pain      . Diphenhydramine-APAP, sleep, (GOODY PM PO) Take 1 packet by mouth at bedtime as needed (for pain/sleep).      [name] etonogestrel-ethinyl estradiol (NUVARING) 0.12-0.015 MG/24HR vaginal ring Place 1 each vaginally every 28 (twenty-eight) days. Insert vaginally and leave in place for 3 consecutive weeks, then remove for 1 week.       . fluticasone (FLONASE) 50 MCG/ACT nasal spray Place 2 sprays into both nostrils daily.  16 g  2  . hydrochlorothiazide (HYDRODIURIL) 25 MG tablet Take 0.5 tablets (12.5 mg total) by mouth daily.  30 tablet  5  . ibuprofen (ADVIL,MOTRIN) 800 MG tablet Take 800 mg by mouth every 8 (eight) hours as needed.      . methylcellulose (ARTIFICIAL TEARS) 1 % ophthalmic  solution Place 1 drop into both eyes daily as needed. Dry Eyes      . Milnacipran (SAVELLA) 50 MG TABS tablet Take 1 tablet (50 mg total) by mouth 2 (two) times daily.  60 tablet  5  . nebivolol (BYSTOLIC) 5 MG tablet Take 1 tablet (5 mg total) by mouth daily.  30 tablet  5  . oxyCODONE-acetaminophen (PERCOCET) 10-325 MG per tablet Take 2 tablets by mouth every 8 (eight) hours as needed for pain.      [name] Potassium Chloride ER 20 MEQ TBCR Take 20 mEq by mouth daily.  90 tablet  0   No current facility-administered medications for this visit.    Allergies  Allergen Reactions  . Shrimp [Shellfish Allergy] Anaphylaxis, Hives and Swelling  . Other     Pt states "I carry an Epipen. I had anaphylactic reaction to something but I  don't know what".     History   Social History  . Marital Status: Married    Spouse Name: N/A    Number of Children: N/A  . Years of Education: N/A   Occupational History  . Not on file.   Social History Main Topics  . Smoking status: Current Every Day Smoker -- 1.00 packs/day for 12 years    Types: Cigarettes  . Smokeless tobacco: Never Used  . Alcohol Use: No  . Drug Use: No  . Sexual Activity: Yes    Birth Control/ Protection: Inserts   Other Topics Concern  . Not on file   Social History Narrative  . No narrative on file    Family History  Problem Relation Age of Onset  . Heart disease Father   . Kidney disease Father   . Alcohol abuse Brother   . Mental retardation Brother   . Colon cancer Maternal Grandmother     ROS- All systems are reviewed.  See the ROS sheet which highlights the she has most ROS positive.  Physical Exam: Filed Vitals:   07/06/14 0852  BP: 133/89  Pulse: 77  Height: 4\' 9"  (1.448 m)  Weight: 174 lb (78.926 kg)    GEN- The patient is well appearing, alert and oriented x 3 today.   Head- normocephalic, atraumatic Eyes-  Sclera clear, conjunctiva pink Ears- hearing intact Oropharynx- clear Neck- supple  Lungs- Clear to ausculation bilaterally, normal work of breathing Heart- Regular rate and rhythm, no murmurs, rubs or gallops, PMI not laterally displaced GI- soft, NT, ND, + BS Extremities- no clubbing, cyanosis, or edema MS- no significant deformity or atrophy Skin- no rash or lesion Psych- euthymic mood, full affect Neuro- strength and sensation are intact  EKG sinus rhythm, normal ekg holter monitor is reviewed and reveals no arrhythmias Echo 6/15 is reviewed [facility] records are reviewed in detail including labs and records from Dr Domenic Polite and Dr Harrington Challenger  Assessment and Plan:  1. Dizziness, palpitations (without documented arrhythmia) and presyncope I believe that the patients symptoms are likely medicine induced.  Her high  doses of chronic NSAIDS have led to edema for which she has been placed on hctz.  She has a relatively normal echo and should not require hctz (which is also responsible for low K).  I believe that if she could stop NSAIDS that her edema would improve.  I have strongly advised this today. I will decrease hctz to 12.5mg  daily today.  She will follow-up with Dr Harrington Challenger and should stop hctz at that time. Adequate hydration is encouraged.  Hopefully off of  hctz, her K will also improve.  It may be reasonable at this time to continue low dose bystolic for palpitations due to pacs/ pvcs. Low sodium diet  I do not feel that she has a significant arrhythmia at this time. No further EP workup is advised I will see as needed She will follow-up with Drs Domenic Polite and Harrington Challenger as scheduled.

## 2014-08-26 ENCOUNTER — Telehealth: Payer: Self-pay | Admitting: Internal Medicine

## 2014-08-26 DIAGNOSIS — E876 Hypokalemia: Secondary | ICD-10-CM

## 2014-08-26 MED ORDER — POTASSIUM CHLORIDE ER 20 MEQ PO TBCR: 20.0000 meq | EXTENDED_RELEASE_TABLET | Freq: Every day | ORAL | Status: DC

## 2014-08-26 NOTE — Telephone Encounter (Signed)
Refill complete 

## 2014-08-26 NOTE — Telephone Encounter (Signed)
Refill on Potassium sent to [facility] in [place] / [place]

## 2014-08-29 ENCOUNTER — Ambulatory Visit (INDEPENDENT_AMBULATORY_CARE_PROVIDER_SITE_OTHER): Payer: 59 | Admitting: Internal Medicine

## 2014-08-29 ENCOUNTER — Encounter: Payer: Self-pay | Admitting: Internal Medicine

## 2014-08-29 VITALS — BP 122/90 | HR 87 | Temp 98.3°F | Ht 62.0 in | Wt 172.0 lb

## 2014-08-29 DIAGNOSIS — I10 Essential (primary) hypertension: Secondary | ICD-10-CM

## 2014-08-29 LAB — BASIC METABOLIC PANEL
BUN: 8 mg/dL (ref 6–23)
CALCIUM: 9.1 mg/dL (ref 8.4–10.5)
CO2: 25 mEq/L (ref 19–32)
Chloride: 101 mEq/L (ref 96–112)
Creat: 0.73 mg/dL (ref 0.50–1.10)
Glucose, Bld: 81 mg/dL (ref 70–99)
Potassium: 3.8 mEq/L (ref 3.5–5.3)
SODIUM: 137 meq/L (ref 135–145)

## 2014-08-29 LAB — CBC
HCT: 36.3 % (ref 36.0–46.0)
HEMOGLOBIN: 12.5 g/dL (ref 12.0–15.0)
MCH: 29.6 pg (ref 26.0–34.0)
MCHC: 34.4 g/dL (ref 30.0–36.0)
MCV: 85.8 fL (ref 78.0–100.0)
PLATELETS: 444 10*3/uL — AB (ref 150–400)
RBC: 4.23 MIL/uL (ref 3.87–5.11)
RDW: 14 % (ref 11.5–15.5)
WBC: 14.8 10*3/uL — ABNORMAL HIGH (ref 4.0–10.5)

## 2014-08-29 NOTE — Progress Notes (Signed)
HPI  Patient is a 33 yo with history of palpitations, dizziness and syncope.  I saw her this past summer  She also has a history of von Willebrand disease, seizures, Bell's palsy, degenerative disc disease, ankylosing spondylitis, fibromyalgia, recurrent palpitations, anxiety and anemia.  Since I saw her she has been seen by J Allred for evaluation of symptoms  See this note for details  She has had extensive eval through the years.  He did not think there were any EP issues Recomm taht she cut back on HCTZ .  Hoped that she could come off them  Recomm that she stya adiequately hydrated  Since seen she has not had any syncopal spells  Some palpitaitons   Now with URI  No fevers.  Cough productive of brownish sputum Has L pleuritic CP/back pain   Taking muicinex and Dayquil Still smokes 1 ppd.    Allergies  Allergen Reactions  . Shrimp [Shellfish Allergy] Anaphylaxis, Hives and Swelling  . Other     Pt states "I carry an Epipen. I had anaphylactic reaction to something but I don't know what".     Current Outpatient Prescriptions  Medication Sig Dispense Refill  . cyclobenzaprine (FLEXERIL) 10 MG tablet Take 1 tablet (10 mg total) by mouth 3 (three) times daily as needed. For muscle spasms  90 tablet  3  . diazepam (VALIUM) 5 MG tablet Take 1 tablet (5 mg total) by mouth every 12 (twelve) hours as needed for anxiety. For muscle spasms  60 tablet  2  . diclofenac sodium (VOLTAREN) 1 % GEL Apply 1 application topically daily as needed. For pain      . Diphenhydramine-APAP, sleep, (GOODY PM PO) Take 1 packet by mouth at bedtime as needed (for pain/sleep).      [name] etonogestrel-ethinyl estradiol (NUVARING) 0.12-0.015 MG/24HR vaginal ring Place 1 each vaginally every 28 (twenty-eight) days. Insert vaginally and leave in place for 3 consecutive weeks, then remove for 1 week.       . fluticasone (FLONASE) 50 MCG/ACT nasal spray Place 2 sprays into both nostrils daily.  16 g  2  . hydrochlorothiazide  (HYDRODIURIL) 25 MG tablet Take 0.5 tablets (12.5 mg total) by mouth daily.  30 tablet  5  . ibuprofen (ADVIL,MOTRIN) 800 MG tablet Take 800 mg by mouth every 8 (eight) hours as needed.      . methylcellulose (ARTIFICIAL TEARS) 1 % ophthalmic solution Place 1 drop into both eyes daily as needed. Dry Eyes      . Milnacipran (SAVELLA) 50 MG TABS tablet Take 1 tablet (50 mg total) by mouth 2 (two) times daily.  60 tablet  5  . nebivolol (BYSTOLIC) 5 MG tablet Take 1 tablet (5 mg total) by mouth daily.  30 tablet  5  . oxyCODONE-acetaminophen (PERCOCET) 10-325 MG per tablet Take 2 tablets by mouth every 8 (eight) hours as needed for pain.      [name] Potassium Chloride ER 20 MEQ TBCR Take 20 mEq by mouth daily.  90 tablet  0  . clobetasol ointment (TEMOVATE) 2.77 % Apply 1 application topically daily as needed (for rash).        No current facility-administered medications for this visit.    Past Medical History  Diagnosis Date  . Von Willebrand disease   . Seizures   . Bell's palsy   . Back pain   . Degenerative disc disease   . DDD (degenerative disc disease)   . Spondylosis   . Fibromyalgia   .  DDD (degenerative disc disease)   . Ankylosing spondylitis   . Fibromyalgia   . PVC (premature ventricular contraction)   . Palpitations   . Anemia   . Anxiety   . Kidney stone   . Myositis   . Lyme arthritis     Past Surgical History  Procedure Laterality Date  . Oophorectomy      Left side    Family History  Problem Relation Age of Onset  . Heart disease Father   . Kidney disease Father   . Alcohol abuse Brother   . Mental retardation Brother   . Colon cancer Maternal Grandmother     History   Social History  . Marital Status: Married    Spouse Name: N/A    Number of Children: N/A  . Years of Education: N/A   Occupational History  . Not on file.   Social History Main Topics  . Smoking status: Current Every Day Smoker -- 1.00 packs/day for 12 years    Types: Cigarettes   . Smokeless tobacco: Never Used  . Alcohol Use: No  . Drug Use: No  . Sexual Activity: Yes    Birth Control/ Protection: Inserts   Other Topics Concern  . Not on file   Social History Narrative  . No narrative on file    Review of Systems:  All systems reviewed.  They are negative to the above problem except as previously stated.  Vital Signs: BP 122/90  Pulse 87  Temp(Src) 98.3 F (36.8 C)  Ht 5\' 2"  (1.575 m)  Wt 172 lb (78.019 kg)  BMI 31.45 kg/m2  Physical Exam Patinet is in NAD HEENT:  Normocephalic, atraumatic. EOMI, PERRLA.  Neck: JVP is normal.  No bruits.  Lungs: clear to auscultation. No rales no wheezes.  Chest Heart: Regular rate and rhythm. Normal S1, S2. No S3.   No significant murmurs. PMI not displaced.  Abdomen:  Supple, nontender. Normal bowel sounds. No masses. No hepatomegaly.  Extremities:   Good distal pulses throughout. No lower extremity edema.  Musculoskeletal :moving all extremities.  Neuro:   alert and oriented x3.  CN II-XII grossly intact.   Assessment and Plan:  1.  Palpitations/dizziness  No documented arrhythmias  Despite extensive seraching Would use  b bocker to control symptoms  2.  HTN  Diastolic is a little high  Will need to follow   Continue  meds  Check labs    3.  URI  No signs on exam to sugg pneumonia  Will check CBC  Encouraged her to use mucinex    4.  Tob  counselled on cessation.    2. Presyncope/syncope.    3.  Hypokalemia

## 2014-08-29 NOTE — Patient Instructions (Signed)
Your physician wants you to follow-up in: May 2016 You will receive a reminder letter in the mail two months in advance. If you don't receive a letter, please call our office to schedule the follow-up appointment.     Your physician recommends that you continue on your current medications as directed. Please refer to the Current Medication list given to you today.      Please get lab work today   (CBC,BMET)      Thank you for choosing [facility] !

## 2014-08-31 ENCOUNTER — Telehealth: Payer: Self-pay | Admitting: *Deleted

## 2014-08-31 NOTE — Telephone Encounter (Signed)
PT IS CALLING FOR LAB RESULTS

## 2014-09-02 ENCOUNTER — Telehealth: Payer: Self-pay

## 2014-09-02 DIAGNOSIS — R059 Cough, unspecified: Secondary | ICD-10-CM

## 2014-09-02 DIAGNOSIS — R05 Cough: Secondary | ICD-10-CM

## 2014-09-02 NOTE — Telephone Encounter (Signed)
Results given by nurse/tmj

## 2014-09-02 NOTE — Telephone Encounter (Signed)
Message copied by Bernita Raisin on Fri Sep 02, 2014  7:56 AM ------      Message from: Dorris Carnes V      Created: Thu Sep 01, 2014 10:50 PM       Electrolytes are normal      White count is a little elevated  She did have a cough      It has been mildly elevated in past      WOuld repeat in 1 wk with differential and [facility]. ------

## 2014-09-02 NOTE — Telephone Encounter (Signed)
Spoke with pt,will repat labs as directed

## 2014-09-05 LAB — CBC WITH DIFFERENTIAL/PLATELET
Basophils Absolute: 0 10*3/uL (ref 0.0–0.1)
Basophils Relative: 0 % (ref 0–1)
Eosinophils Absolute: 0.2 10*3/uL (ref 0.0–0.7)
Eosinophils Relative: 2 % (ref 0–5)
HCT: 35.2 % — ABNORMAL LOW (ref 36.0–46.0)
Hemoglobin: 12.1 g/dL (ref 12.0–15.0)
Lymphocytes Relative: 37 % (ref 12–46)
Lymphs Abs: 4.3 10*3/uL — ABNORMAL HIGH (ref 0.7–4.0)
MCH: 29.4 pg (ref 26.0–34.0)
MCHC: 34.4 g/dL (ref 30.0–36.0)
MCV: 85.4 fL (ref 78.0–100.0)
Monocytes Absolute: 0.8 10*3/uL (ref 0.1–1.0)
Monocytes Relative: 7 % (ref 3–12)
Neutro Abs: 6.3 10*3/uL (ref 1.7–7.7)
Neutrophils Relative %: 54 % (ref 43–77)
Platelets: 470 10*3/uL — ABNORMAL HIGH (ref 150–400)
RBC: 4.12 MIL/uL (ref 3.87–5.11)
RDW: 13.8 % (ref 11.5–15.5)
WBC: 11.6 10*3/uL — ABNORMAL HIGH (ref 4.0–10.5)

## 2014-09-06 LAB — SEDIMENTATION RATE: Sed Rate: 100 mm/hr — ABNORMAL HIGH (ref 0–22)

## 2014-09-12 ENCOUNTER — Encounter: Payer: Self-pay | Admitting: Family Medicine

## 2014-09-12 ENCOUNTER — Ambulatory Visit (INDEPENDENT_AMBULATORY_CARE_PROVIDER_SITE_OTHER): Payer: 59 | Admitting: Family Medicine

## 2014-09-12 VITALS — BP 120/60 | HR 78 | Temp 98.1°F | Resp 14 | Ht 62.0 in | Wt 173.0 lb

## 2014-09-12 DIAGNOSIS — D72829 Elevated white blood cell count, unspecified: Secondary | ICD-10-CM

## 2014-09-12 NOTE — Progress Notes (Signed)
Subjective:    Patient ID: Theresa Washington, female    DOB: [birth year], 33 y.o.   MRN: [id number]  HPI Patient has a past medical history of fibromyalgia as well as somatization disorder. There is a questionable history of ankylosing spondylitis although there is no evidence on radiographic studies. Previously I check sedimentation rates here which had been normal. Recently the patient was seen by her cardiologist. At the time she had a cough productive of green and brown sputum. At that time her white blood cell count was elevated at 14.8. Her cardiologist repeated white blood cell count week later and it fallen to less than 12. Unfortunately a sedimentation rate remained elevated at 100. The patient is here today to follow this up.  Her cough has completely resolved. She denies any chest pain. She denies any pleurisy. She denies any shortness of breath. She denies any hemoptysis. She denies any fevers. She continues to complain of pain and swelling all throughout her body however. She primarily is complaining of pain over the right lateral aspect of her neck and around the medial epicondyles of both arms. Past Medical History  Diagnosis Date  . Von Willebrand disease   . Seizures   . Bell's palsy   . Back pain   . Degenerative disc disease   . DDD (degenerative disc disease)   . Spondylosis   . Fibromyalgia   . DDD (degenerative disc disease)   . Ankylosing spondylitis   . Fibromyalgia   . PVC (premature ventricular contraction)   . Palpitations   . Anemia   . Anxiety   . Kidney stone   . Myositis   . Lyme arthritis    Past Surgical History  Procedure Laterality Date  . Oophorectomy      Left side   Current Outpatient Prescriptions on File Prior to Visit  Medication Sig Dispense Refill  . clobetasol ointment (TEMOVATE) 5.88 % Apply 1 application topically daily as needed (for rash).       . cyclobenzaprine (FLEXERIL) 10 MG tablet Take 1 tablet (10 mg total) by mouth 3  (three) times daily as needed. For muscle spasms  90 tablet  3  . diazepam (VALIUM) 5 MG tablet Take 1 tablet (5 mg total) by mouth every 12 (twelve) hours as needed for anxiety. For muscle spasms  60 tablet  2  . diclofenac sodium (VOLTAREN) 1 % GEL Apply 1 application topically daily as needed. For pain      . Diphenhydramine-APAP, sleep, (GOODY PM PO) Take 1 packet by mouth at bedtime as needed (for pain/sleep).      [name] etonogestrel-ethinyl estradiol (NUVARING) 0.12-0.015 MG/24HR vaginal ring Place 1 each vaginally every 28 (twenty-eight) days. Insert vaginally and leave in place for 3 consecutive weeks, then remove for 1 week.       . fluticasone (FLONASE) 50 MCG/ACT nasal spray Place 2 sprays into both nostrils daily.  16 g  2  . hydrochlorothiazide (HYDRODIURIL) 25 MG tablet Take 0.5 tablets (12.5 mg total) by mouth daily.  30 tablet  5  . ibuprofen (ADVIL,MOTRIN) 800 MG tablet Take 800 mg by mouth every 8 (eight) hours as needed.      . methylcellulose (ARTIFICIAL TEARS) 1 % ophthalmic solution Place 1 drop into both eyes daily as needed. Dry Eyes      . Milnacipran (SAVELLA) 50 MG TABS tablet Take 1 tablet (50 mg total) by mouth 2 (two) times daily.  60 tablet  5  . nebivolol (  BYSTOLIC) 5 MG tablet Take 1 tablet (5 mg total) by mouth daily.  30 tablet  5  . oxyCODONE-acetaminophen (PERCOCET) 10-325 MG per tablet Take 2 tablets by mouth every 8 (eight) hours as needed for pain.      [name] Potassium Chloride ER 20 MEQ TBCR Take 20 mEq by mouth daily.  90 tablet  0   No current facility-administered medications on file prior to visit.   Allergies  Allergen Reactions  . Shrimp [Shellfish Allergy] Anaphylaxis, Hives and Swelling  . Other     Pt states "I carry an Epipen. I had anaphylactic reaction to something but I don't know what".    History   Social History  . Marital Status: Married    Spouse Name: N/A    Number of Children: N/A  . Years of Education: N/A   Occupational History  .  Not on file.   Social History Main Topics  . Smoking status: Current Every Day Smoker -- 1.00 packs/day for 12 years    Types: Cigarettes  . Smokeless tobacco: Never Used  . Alcohol Use: No  . Drug Use: No  . Sexual Activity: Yes    Birth Control/ Protection: Inserts   Other Topics Concern  . Not on file   Social History Narrative  . No narrative on file      Review of Systems  All other systems reviewed and are negative.      Objective:   Physical Exam  Vitals reviewed. Constitutional: She appears well-developed and well-nourished.  HENT:  Right Ear: External ear normal.  Left Ear: External ear normal.  Nose: Nose normal.  Mouth/Throat: Oropharynx is clear and moist. No oropharyngeal exudate.  Eyes: Conjunctivae are normal. No scleral icterus.  Neck: Neck supple.  Cardiovascular: Normal rate, regular rhythm and normal heart sounds.   No murmur heard. Pulmonary/Chest: Effort normal and breath sounds normal. No respiratory distress. She has no wheezes. She has no rales. She exhibits no tenderness.  Musculoskeletal: She exhibits tenderness.  Lymphadenopathy:    She has no cervical adenopathy.  Skin: No rash noted. No erythema.          Assessment & Plan:  Leukocytosis - Plan: CBC with Differential, Sedimentation rate  Patient's physical exam is unremarkable today outside of tenderness to palpation around both elbows and in the right aspect of her neck. There are no palpable abnormalities in this area. I believe the patient's elevated white blood cell count and elevated sedimentation rate were due to her recent bronchitis. Clinically this is resolved. I will repeat a CBC and a sedimentation rate today. If these have returned to normal no further workup is needed at this time. If they remain elevated I begin the workup by obtaining a chest x-ray and then possibly referring the patient to a rheumatologist if cxr is nml.

## 2014-09-13 LAB — CBC WITH DIFFERENTIAL/PLATELET
Basophils Absolute: 0 10*3/uL (ref 0.0–0.1)
Basophils Relative: 0 % (ref 0–1)
EOS ABS: 0.2 10*3/uL (ref 0.0–0.7)
EOS PCT: 2 % (ref 0–5)
HEMATOCRIT: 33.4 % — AB (ref 36.0–46.0)
HEMOGLOBIN: 11.8 g/dL — AB (ref 12.0–15.0)
LYMPHS ABS: 4 10*3/uL (ref 0.7–4.0)
LYMPHS PCT: 37 % (ref 12–46)
MCH: 29.7 pg (ref 26.0–34.0)
MCHC: 35.3 g/dL (ref 30.0–36.0)
MCV: 84.1 fL (ref 78.0–100.0)
MONOS PCT: 7 % (ref 3–12)
Monocytes Absolute: 0.8 10*3/uL (ref 0.1–1.0)
Neutro Abs: 5.9 10*3/uL (ref 1.7–7.7)
Neutrophils Relative %: 54 % (ref 43–77)
Platelets: 477 10*3/uL — ABNORMAL HIGH (ref 150–400)
RBC: 3.97 MIL/uL (ref 3.87–5.11)
RDW: 13.7 % (ref 11.5–15.5)
WBC: 10.9 10*3/uL — AB (ref 4.0–10.5)

## 2014-09-13 LAB — SEDIMENTATION RATE: Sed Rate: 104 mm/hr — ABNORMAL HIGH (ref 0–22)

## 2014-09-14 ENCOUNTER — Other Ambulatory Visit: Payer: Self-pay | Admitting: Family Medicine

## 2014-09-14 DIAGNOSIS — D72829 Elevated white blood cell count, unspecified: Secondary | ICD-10-CM

## 2014-09-14 DIAGNOSIS — R700 Elevated erythrocyte sedimentation rate: Secondary | ICD-10-CM

## 2014-09-15 ENCOUNTER — Telehealth: Payer: Self-pay | Admitting: *Deleted

## 2014-09-15 ENCOUNTER — Ambulatory Visit (HOSPITAL_COMMUNITY)
Admission: RE | Admit: 2014-09-15 | Discharge: 2014-09-15 | Disposition: A | Discharge status: Home / Self Care | Payer: 59 | Source: Ambulatory Visit | Attending: Family Medicine | Admitting: Family Medicine

## 2014-09-15 DIAGNOSIS — D72829 Elevated white blood cell count, unspecified: Secondary | ICD-10-CM

## 2014-09-15 DIAGNOSIS — Z87891 Personal history of nicotine dependence: Secondary | ICD-10-CM | POA: Insufficient documentation

## 2014-09-15 DIAGNOSIS — R700 Elevated erythrocyte sedimentation rate: Secondary | ICD-10-CM | POA: Insufficient documentation

## 2014-09-15 MED ORDER — CYCLOBENZAPRINE HCL 10 MG PO TABS: 10.0000 mg | ORAL_TABLET | Freq: Three times a day (TID) | ORAL | Status: DC | PRN

## 2014-09-15 NOTE — Telephone Encounter (Signed)
ok 

## 2014-09-15 NOTE — Telephone Encounter (Signed)
Received fax requesting refill on Cyclobenzaprine.   Ok to refill??  Last office visit 09/12/2014.  Last refill  04/14/2014, #3 refills.

## 2014-09-15 NOTE — Telephone Encounter (Signed)
Prescription sent to pharmacy.

## 2014-09-16 ENCOUNTER — Telehealth: Payer: Self-pay | Admitting: Family Medicine

## 2014-09-16 DIAGNOSIS — M2550 Pain in unspecified joint: Secondary | ICD-10-CM

## 2014-09-16 DIAGNOSIS — D72829 Elevated white blood cell count, unspecified: Secondary | ICD-10-CM

## 2014-09-16 NOTE — Telephone Encounter (Signed)
Spoke to pt about X-ray and she had a few questions - What is after rheumatology if they do not find anything - should she go see a hematologist? Also she has had several injections in her back for pain management and she was wondering if there is any way that she could have an infection from one of those injections and this is causing her symptoms?

## 2014-09-16 NOTE — Telephone Encounter (Signed)
Requesting refill on Diazepam 5 mg 1 po bid - last refill 08/20/14 - ? OK to Refill

## 2014-09-16 NOTE — Telephone Encounter (Signed)
Error duplicate request

## 2014-09-19 MED ORDER — DIAZEPAM 5 MG PO TABS: 5.0000 mg | ORAL_TABLET | Freq: Two times a day (BID) | ORAL | Status: DC | PRN

## 2014-09-19 NOTE — Telephone Encounter (Signed)
RF x1 fax to pharmacy

## 2014-09-19 NOTE — Telephone Encounter (Signed)
Ok to refill valium

## 2014-09-19 NOTE — Telephone Encounter (Signed)
Let's start with rheumatology.  I don't think she has an infection in her back based on her symptoms.

## 2014-09-22 ENCOUNTER — Telehealth: Payer: Self-pay | Admitting: *Deleted

## 2014-09-22 NOTE — Telephone Encounter (Signed)
pt has appt @[facility]  w/ Dr. Malva Limes on Dec 7 @ 8:20am Fax ov notes [phone], left message for pt to return my call for appt

## 2014-09-23 NOTE — Telephone Encounter (Signed)
Pt called back and aware of appt 

## 2014-10-20 ENCOUNTER — Telehealth: Payer: Self-pay | Admitting: Family Medicine

## 2014-10-20 NOTE — Telephone Encounter (Signed)
Requesting a refill on her diazepam - ? OK to Refill

## 2014-10-21 MED ORDER — DIAZEPAM 5 MG PO TABS: 5.0000 mg | ORAL_TABLET | Freq: Two times a day (BID) | ORAL | Status: DC | PRN

## 2014-10-21 NOTE — Telephone Encounter (Signed)
Med called to pharm 

## 2014-10-21 NOTE — Telephone Encounter (Signed)
ok 

## 2014-10-30 ENCOUNTER — Other Ambulatory Visit: Payer: Self-pay | Admitting: Obstetrics & Gynecology

## 2014-11-04 ENCOUNTER — Ambulatory Visit (HOSPITAL_COMMUNITY)
Admission: RE | Admit: 2014-11-04 | Discharge: 2014-11-04 | Disposition: A | Discharge status: Home / Self Care | Payer: 59 | Source: Ambulatory Visit | Attending: Internal Medicine | Admitting: Internal Medicine

## 2014-11-04 ENCOUNTER — Other Ambulatory Visit (HOSPITAL_COMMUNITY): Payer: Self-pay | Admitting: Internal Medicine

## 2014-11-04 DIAGNOSIS — M545 Low back pain: Secondary | ICD-10-CM | POA: Diagnosis not present

## 2014-11-04 DIAGNOSIS — R7982 Elevated C-reactive protein (CRP): Secondary | ICD-10-CM

## 2014-11-04 DIAGNOSIS — R700 Elevated erythrocyte sedimentation rate: Secondary | ICD-10-CM

## 2014-11-08 ENCOUNTER — Encounter: Payer: Self-pay | Admitting: Family Medicine

## 2014-11-08 ENCOUNTER — Ambulatory Visit (INDEPENDENT_AMBULATORY_CARE_PROVIDER_SITE_OTHER): Payer: 59 | Admitting: Family Medicine

## 2014-11-08 VITALS — BP 160/92 | HR 104 | Temp 98.4°F | Resp 16 | Ht 62.0 in | Wt 172.0 lb

## 2014-11-08 DIAGNOSIS — R300 Dysuria: Secondary | ICD-10-CM

## 2014-11-08 LAB — URINALYSIS, ROUTINE W REFLEX MICROSCOPIC
Bilirubin Urine: NEGATIVE
Glucose, UA: NEGATIVE mg/dL
Ketones, ur: NEGATIVE mg/dL
NITRITE: NEGATIVE
Protein, ur: NEGATIVE mg/dL
SPECIFIC GRAVITY, URINE: 1.02 (ref 1.005–1.030)
UROBILINOGEN UA: 0.2 mg/dL (ref 0.0–1.0)
pH: 6 (ref 5.0–8.0)

## 2014-11-08 LAB — URINALYSIS, MICROSCOPIC ONLY
CRYSTALS: NONE SEEN
WBC, UA: >50 WBC/hpf — AB (ref <3)

## 2014-11-08 MED ORDER — CIPROFLOXACIN HCL 500 MG PO TABS: 500.0000 mg | ORAL_TABLET | Freq: Two times a day (BID) | ORAL | Status: DC

## 2014-11-08 NOTE — Addendum Note (Signed)
Addended by: Olena Mater on: 11/08/2014 09:32 AM   Modules accepted: Orders

## 2014-11-08 NOTE — Progress Notes (Signed)
Subjective:    Patient ID: Theresa Washington, female    DOB: 1981/07/01, 33 y.o.   MRN: [id number]  HPI Patient reports 4 days of polyuria, frequency, urgency, hesitancy, and dysuria. She has now developed left-sided low back pain CVA tenderness. She denies any fever nausea or vomiting. Her blood pressure is elevated today. It is normally well controlled. However she states that she is in moderate to severe pain in her neck. Her heart rate is also elevated due to her pain. Past Medical History  Diagnosis Date  . Von Willebrand disease   . Seizures   . Bell's palsy   . Back pain   . Degenerative disc disease   . DDD (degenerative disc disease)   . Spondylosis   . Fibromyalgia   . DDD (degenerative disc disease)   . Ankylosing spondylitis   . Fibromyalgia   . PVC (premature ventricular contraction)   . Palpitations   . Anemia   . Anxiety   . Kidney stone   . Myositis   . Lyme arthritis    Past Surgical History  Procedure Laterality Date  . Oophorectomy      Left side   Current Outpatient Prescriptions on File Prior to Visit  Medication Sig Dispense Refill  . clobetasol ointment (TEMOVATE) 2.67 % Apply 1 application topically daily as needed (for rash).     . diazepam (VALIUM) 5 MG tablet Take 1 tablet (5 mg total) by mouth every 12 (twelve) hours as needed for anxiety. For muscle spasms 60 tablet 2  . diclofenac sodium (VOLTAREN) 1 % GEL Apply 1 application topically daily as needed. For pain    . Diphenhydramine-APAP, sleep, (GOODY PM PO) Take 1 packet by mouth at bedtime as needed (for pain/sleep).    . fluticasone (FLONASE) 50 MCG/ACT nasal spray Place 2 sprays into both nostrils daily. 16 g 2  . hydrochlorothiazide (HYDRODIURIL) 25 MG tablet Take 0.5 tablets (12.5 mg total) by mouth daily. 30 tablet 5  . ibuprofen (ADVIL,MOTRIN) 800 MG tablet Take 800 mg by mouth every 8 (eight) hours as needed.    . methylcellulose (ARTIFICIAL TEARS) 1 % ophthalmic solution Place 1  drop into both eyes daily as needed. Dry Eyes    . Milnacipran (SAVELLA) 50 MG TABS tablet Take 1 tablet (50 mg total) by mouth 2 (two) times daily. 60 tablet 5  . nebivolol (BYSTOLIC) 5 MG tablet Take 1 tablet (5 mg total) by mouth daily. 30 tablet 5  . NUVARING 0.12-0.015 MG/24HR vaginal ring INSERT 1 RING VAGINALLY CONTINUOUSLY X 4 WEEKS. 1 each 12  . oxyCODONE-acetaminophen (PERCOCET) 10-325 MG per tablet Take 2 tablets by mouth every 8 (eight) hours as needed for pain.    [name] Potassium Chloride ER 20 MEQ TBCR Take 20 mEq by mouth daily. 90 tablet 0   No current facility-administered medications on file prior to visit.   Allergies  Allergen Reactions  . Shrimp [Shellfish Allergy] Anaphylaxis, Hives and Swelling  . Other     Pt states "I carry an Epipen. I had anaphylactic reaction to something but I don't know what".    History   Social History  . Marital Status: Married    Spouse Name: N/A    Number of Children: N/A  . Years of Education: N/A   Occupational History  . Not on file.   Social History Main Topics  . Smoking status: Current Every Day Smoker -- 1.00 packs/day for 12 years    Types: Cigarettes  .  Smokeless tobacco: Never Used  . Alcohol Use: No  . Drug Use: No  . Sexual Activity: Yes    Birth Control/ Protection: Inserts   Other Topics Concern  . Not on file   Social History Narrative      Review of Systems  All other systems reviewed and are negative.      Objective:   Physical Exam  Cardiovascular: Normal rate and normal heart sounds.   Pulmonary/Chest: Effort normal and breath sounds normal. No respiratory distress. She has no wheezes. She has no rales. She exhibits no tenderness.  Abdominal: Soft. Bowel sounds are normal. She exhibits no distension. There is no tenderness. There is no rebound and no guarding.  Vitals reviewed.         Assessment & Plan:  Burning with urination - Plan: Urinalysis, Routine w reflex microscopic  Symptoms  are concerning for possible developing pyelonephritis. I will start the patient on Cipro 500 mg by mouth twice a day for 7 days.. I have asked her to check her blood pressure daily and bring the values to me in one to 2 weeks.  If consistently elevated, I would add amlodipine.

## 2014-11-09 ENCOUNTER — Telehealth: Payer: Self-pay | Admitting: Family Medicine

## 2014-11-09 MED ORDER — NITROFURANTOIN MONOHYD MACRO 100 MG PO CAPS: 100.0000 mg | ORAL_CAPSULE | Freq: Two times a day (BID) | ORAL | Status: DC

## 2014-11-09 NOTE — Telephone Encounter (Signed)
Pt called and states that she is having severe diarrhea with the cipro and was wondering if we could change it for her or call in something for the diarrhea?  Per Dr. Buelah Manis ok to change to Macrobid 100mg  bid x 7 days.  Med sent to pharm and pt aware.

## 2014-11-10 LAB — URINE CULTURE: Colony Count: >=100000

## 2014-11-16 ENCOUNTER — Other Ambulatory Visit: Payer: Self-pay | Admitting: Family Medicine

## 2014-11-16 MED ORDER — NEBIVOLOL HCL 5 MG PO TABS: 5.0000 mg | ORAL_TABLET | Freq: Every day | ORAL | Status: DC

## 2014-11-16 NOTE — Telephone Encounter (Signed)
Med sent to pharm 

## 2014-11-21 ENCOUNTER — Other Ambulatory Visit: Payer: Self-pay | Admitting: Family Medicine

## 2014-11-21 MED ORDER — MILNACIPRAN HCL 50 MG PO TABS: 50.0000 mg | ORAL_TABLET | Freq: Two times a day (BID) | ORAL | Status: DC

## 2014-11-21 NOTE — Telephone Encounter (Signed)
Med requested sent to pharm

## 2014-11-23 ENCOUNTER — Telehealth: Payer: Self-pay | Admitting: Family Medicine

## 2014-11-23 NOTE — Telephone Encounter (Signed)
Says has two pinched nerves in neck.  Made appt for her to see provider

## 2014-11-23 NOTE — Telephone Encounter (Signed)
Patient is calling to talk with you regarding the pinched nerve in her back  [phone]

## 2014-11-24 ENCOUNTER — Ambulatory Visit (INDEPENDENT_AMBULATORY_CARE_PROVIDER_SITE_OTHER): Payer: 59 | Admitting: Family Medicine

## 2014-11-24 ENCOUNTER — Encounter: Payer: Self-pay | Admitting: Family Medicine

## 2014-11-24 VITALS — BP 140/92 | HR 76 | Temp 98.4°F | Resp 18 | Ht 62.0 in | Wt 168.0 lb

## 2014-11-24 DIAGNOSIS — M5010 Cervical disc disorder with radiculopathy, unspecified cervical region: Secondary | ICD-10-CM

## 2014-11-24 MED ORDER — DICLOFENAC SODIUM 1 % TD GEL: 1.0000 "application " | Freq: Every day | TRANSDERMAL | Status: DC | PRN

## 2014-11-24 MED ORDER — PREDNISONE 20 MG PO TABS: ORAL_TABLET | ORAL | Status: DC

## 2014-11-24 NOTE — Progress Notes (Signed)
Subjective:    Patient ID: Theresa Washington, female    DOB: [birth year], 34 y.o.   MRN: [id number]  HPI Patient has fibromyalgia. She had as cervical spine MRI performed in March 2015:  There is unchanged straightening of the cervical spine. There is no listhesis. Vertebral marrow signal is unremarkable. Craniocervical junction is unremarkable. The cervical spinal cord is normal in caliber and signal. Visualized paraspinal soft tissues are unremarkable.  C2-3: Right uncovertebral hypertrophy without stenosis, unchanged.  C3-4: Disc osteophyte complex eccentric to the right results in borderline spinal stenosis, not significantly changed. No neural foraminal stenosis.  C4-5: Mild left neural foraminal narrowing due to uncovertebral hypertrophy, unchanged. No spinal canal stenosis.  C5-6: Negative.  C6-7: Mild disc bulge and left uncovertebral hypertrophy without stenosis, unchanged.  C7-T1: Negative.  There was no significant nerve impingement at that time. Patient states that for the last year she has had a "pinched nerve" on the left side of her neck which causes her severe pain. She states that she has numbness and tingling in the third fourth and fifth finger on her left hand. She will have numbness around her left shoulder blade and down her upper left back and near constant pain on the left side of her neck near the insertion of the trapezius on the base of the occiput. This area is tender to palpation. She has limited lateral rotation of the neck to the left. There is no apparent decrease in her flexion or extension. She denies any falls or injury.  Is also concerned about her blood pressure however the number she is recording at home seem very unrealistic. One blood pressure was listed as 130/198. She also had a blood pressure of 118/190. The day before that her blood pressure was 124/105.  She is adamant that she is reading the machine correctly. Past Medical  History  Diagnosis Date  . Von Willebrand disease   . Seizures   . Bell's palsy   . Back pain   . Degenerative disc disease   . DDD (degenerative disc disease)   . Spondylosis   . Fibromyalgia   . DDD (degenerative disc disease)   . Ankylosing spondylitis   . Fibromyalgia   . PVC (premature ventricular contraction)   . Palpitations   . Anemia   . Anxiety   . Kidney stone   . Myositis   . Lyme arthritis    Past Surgical History  Procedure Laterality Date  . Oophorectomy      Left side   Current Outpatient Prescriptions on File Prior to Visit  Medication Sig Dispense Refill  . diazepam (VALIUM) 5 MG tablet Take 1 tablet (5 mg total) by mouth every 12 (twelve) hours as needed for anxiety. For muscle spasms 60 tablet 2  . Diphenhydramine-APAP, sleep, (GOODY PM PO) Take 1 packet by mouth at bedtime as needed (for pain/sleep).    . fluticasone (FLONASE) 50 MCG/ACT nasal spray Place 2 sprays into both nostrils daily. 16 g 2  . hydrochlorothiazide (HYDRODIURIL) 25 MG tablet Take 0.5 tablets (12.5 mg total) by mouth daily. 30 tablet 5  . ibuprofen (ADVIL,MOTRIN) 800 MG tablet Take 800 mg by mouth every 8 (eight) hours as needed.    . methylcellulose (ARTIFICIAL TEARS) 1 % ophthalmic solution Place 1 drop into both eyes daily as needed. Dry Eyes    . Milnacipran (SAVELLA) 50 MG TABS tablet Take 1 tablet (50 mg total) by mouth 2 (two) times daily. 60 tablet 5  .  nebivolol (BYSTOLIC) 5 MG tablet Take 1 tablet (5 mg total) by mouth daily. 30 tablet 11  . NUVARING 0.12-0.015 MG/24HR vaginal ring INSERT 1 RING VAGINALLY CONTINUOUSLY X 4 WEEKS. 1 each 12  . oxyCODONE-acetaminophen (PERCOCET) 10-325 MG per tablet Take 2 tablets by mouth every 8 (eight) hours as needed for pain.    [name] Potassium Chloride ER 20 MEQ TBCR Take 20 mEq by mouth daily. 90 tablet 0  . clobetasol ointment (TEMOVATE) 9.37 % Apply 1 application topically daily as needed (for rash).      No current facility-administered  medications on file prior to visit.   Allergies  Allergen Reactions  . Shrimp [Shellfish Allergy] Anaphylaxis, Hives and Swelling  . Other     Pt states "I carry an Epipen. I had anaphylactic reaction to something but I don't know what".    History   Social History  . Marital Status: Married    Spouse Name: N/A    Number of Children: N/A  . Years of Education: N/A   Occupational History  . Not on file.   Social History Main Topics  . Smoking status: Current Every Day Smoker -- 1.00 packs/day for 12 years    Types: Cigarettes  . Smokeless tobacco: Never Used  . Alcohol Use: No  . Drug Use: No  . Sexual Activity: Yes    Birth Control/ Protection: Inserts   Other Topics Concern  . Not on file   Social History Narrative      Review of Systems  All other systems reviewed and are negative.      Objective:   Physical Exam  Constitutional: She is oriented to person, place, and time.  Cardiovascular: Normal rate and regular rhythm.   Pulmonary/Chest: Effort normal and breath sounds normal.  Musculoskeletal:       Cervical back: She exhibits decreased range of motion, tenderness, bony tenderness, pain and spasm.  Neurological: She is alert and oriented to person, place, and time. She has normal reflexes. She displays normal reflexes. No cranial nerve deficit. She exhibits normal muscle tone. Coordination normal.  Vitals reviewed.         Assessment & Plan:  Cervical disc disorder with radiculopathy of cervical region - Plan: predniSONE (DELTASONE) 20 MG tablet  I will treat the patient for possible depression nerve in her neck given the numbness and tingling she's having in her arm and the numbness in her upper left back. I will start her on a prednisone taper pack and then recheck in one week. I have asked the patient to return here to let us check her blood pressure at random times as the number she is giving me from home are inaccurate.

## 2014-11-28 ENCOUNTER — Telehealth: Payer: Self-pay | Admitting: Family Medicine

## 2014-11-28 DIAGNOSIS — M542 Cervicalgia: Secondary | ICD-10-CM

## 2014-11-28 NOTE — Telephone Encounter (Signed)
Has finished the Prednisone.  Still with severe left side neck pain causing HA.  Having a lot of tingling into shoulder blades.  Pain meds not helping either.  Has gotten much worse.  Something "Popped" in mid back yesterday.  Has not slept all night.  Hurts to breath.  Has no comfortable position.  Husband told her rt  Side neck looks puffy. Cell # [phone]

## 2014-11-28 NOTE — Telephone Encounter (Signed)
Patient is calling to say the prednisone that was prescribed to her is not working would like a call back  At [phone]

## 2014-11-29 ENCOUNTER — Ambulatory Visit (HOSPITAL_COMMUNITY)
Admission: RE | Admit: 2014-11-29 | Discharge: 2014-11-29 | Disposition: A | Discharge status: Home / Self Care | Payer: 59 | Source: Ambulatory Visit | Attending: Family Medicine | Admitting: Family Medicine

## 2014-11-29 DIAGNOSIS — M542 Cervicalgia: Secondary | ICD-10-CM | POA: Insufficient documentation

## 2014-11-29 NOTE — Telephone Encounter (Signed)
Please get c-spine neck xray.

## 2014-11-29 NOTE — Telephone Encounter (Signed)
xrays ordered and pt made aware.  Wants to go to [facility], told just go there anytime straight to [employer].

## 2014-12-01 ENCOUNTER — Telehealth: Payer: Self-pay | Admitting: Family Medicine

## 2014-12-01 DIAGNOSIS — M542 Cervicalgia: Secondary | ICD-10-CM

## 2014-12-01 NOTE — Telephone Encounter (Signed)
Will call about x-ray but suggestions on what else is going on with her/

## 2014-12-01 NOTE — Telephone Encounter (Signed)
I see no structural problems on her neck xray or MRI from 3/15.  She has appt with pain MD Monday and I suggested she discuss pain management options further with him.  She would like a second opinion from ortho which I will gladly arrange but I do not feel this is a surgical issue or from a pinched nerve in her neck.  Please arrange appt with Dr. Rolena Infante at [facility] ortho.

## 2014-12-01 NOTE — Telephone Encounter (Signed)
Patient is calling to get xray results that were done, and also would like to let dr pickard know that her symptoms are getting worse and she is also having speech issues please call her at [phone]

## 2014-12-02 NOTE — Telephone Encounter (Signed)
Referral ordered and pt aware of below via Dr. Dennard Schaumann

## 2014-12-06 ENCOUNTER — Telehealth: Payer: Self-pay | Admitting: Family Medicine

## 2014-12-06 DIAGNOSIS — G8929 Other chronic pain: Secondary | ICD-10-CM

## 2014-12-06 DIAGNOSIS — M542 Cervicalgia: Principal | ICD-10-CM

## 2014-12-06 NOTE — Telephone Encounter (Signed)
Pt aware of cervical xray results.  Does want ortho referral.  States was to pain management provider.  He has given her Zanaflex and is planning a cervical injection.  Also seeing Rheumatologist on 2/22

## 2014-12-06 NOTE — Telephone Encounter (Signed)
-----   Message from Susy Frizzle, MD sent at 11/29/2014 11:25 AM EST ----- Xray is normal.  I can see no explanation of her pain on xray.  I would suggest orthopedic consultation.  Could this fossibly be due to her fibromyalgia?

## 2014-12-06 NOTE — Telephone Encounter (Signed)
Theresa Washington with referral for 2nd opinion.

## 2014-12-20 ENCOUNTER — Other Ambulatory Visit: Payer: Self-pay | Admitting: Internal Medicine

## 2014-12-20 DIAGNOSIS — E876 Hypokalemia: Secondary | ICD-10-CM

## 2014-12-20 MED ORDER — POTASSIUM CHLORIDE ER 20 MEQ PO TBCR: 20.0000 meq | EXTENDED_RELEASE_TABLET | Freq: Every day | ORAL | Status: DC

## 2014-12-20 NOTE — Telephone Encounter (Signed)
escribed refill complete

## 2014-12-20 NOTE — Telephone Encounter (Signed)
Received fax refill request  Rx # [id number] Medication:  Potassium CL ER 20 meq tablet Qty 90 Sig:  Take one tablet by mouth once daily Physician:  Harrington Challenger

## 2015-01-24 ENCOUNTER — Emergency Department (HOSPITAL_COMMUNITY): Payer: 59

## 2015-01-24 ENCOUNTER — Emergency Department (HOSPITAL_COMMUNITY)
Admission: EM | Admit: 2015-01-24 | Discharge: 2015-01-24 | Disposition: A | Discharge status: Home / Self Care | Payer: 59 | Attending: Emergency Medicine | Admitting: Emergency Medicine

## 2015-01-24 ENCOUNTER — Encounter (HOSPITAL_COMMUNITY): Payer: Self-pay

## 2015-01-24 DIAGNOSIS — F419 Anxiety disorder, unspecified: Secondary | ICD-10-CM | POA: Insufficient documentation

## 2015-01-24 DIAGNOSIS — Z720 Tobacco use: Secondary | ICD-10-CM | POA: Insufficient documentation

## 2015-01-24 DIAGNOSIS — Z87442 Personal history of urinary calculi: Secondary | ICD-10-CM | POA: Insufficient documentation

## 2015-01-24 DIAGNOSIS — Z8739 Personal history of other diseases of the musculoskeletal system and connective tissue: Secondary | ICD-10-CM | POA: Insufficient documentation

## 2015-01-24 DIAGNOSIS — Z8619 Personal history of other infectious and parasitic diseases: Secondary | ICD-10-CM | POA: Diagnosis not present

## 2015-01-24 DIAGNOSIS — R29898 Other symptoms and signs involving the musculoskeletal system: Secondary | ICD-10-CM

## 2015-01-24 DIAGNOSIS — Z79899 Other long term (current) drug therapy: Secondary | ICD-10-CM | POA: Insufficient documentation

## 2015-01-24 DIAGNOSIS — Z862 Personal history of diseases of the blood and blood-forming organs and certain disorders involving the immune mechanism: Secondary | ICD-10-CM | POA: Insufficient documentation

## 2015-01-24 DIAGNOSIS — R531 Weakness: Secondary | ICD-10-CM | POA: Diagnosis present

## 2015-01-24 DIAGNOSIS — Z3202 Encounter for pregnancy test, result negative: Secondary | ICD-10-CM | POA: Insufficient documentation

## 2015-01-24 DIAGNOSIS — Z8669 Personal history of other diseases of the nervous system and sense organs: Secondary | ICD-10-CM | POA: Insufficient documentation

## 2015-01-24 LAB — CBC
HCT: 38.4 % (ref 36.0–46.0)
Hemoglobin: 12.7 g/dL (ref 12.0–15.0)
MCH: 29.9 pg (ref 26.0–34.0)
MCHC: 33.1 g/dL (ref 30.0–36.0)
MCV: 90.4 fL (ref 78.0–100.0)
Platelets: 417 10*3/uL — ABNORMAL HIGH (ref 150–400)
RBC: 4.25 MIL/uL (ref 3.87–5.11)
RDW: 13.1 % (ref 11.5–15.5)
WBC: 12.6 10*3/uL — ABNORMAL HIGH (ref 4.0–10.5)

## 2015-01-24 LAB — I-STAT CHEM 8, ED
BUN: 10 mg/dL (ref 6–23)
CHLORIDE: 101 mmol/L (ref 96–112)
Calcium, Ion: 1.18 mmol/L (ref 1.12–1.23)
Creatinine, Ser: 0.6 mg/dL (ref 0.50–1.10)
Glucose, Bld: 104 mg/dL — ABNORMAL HIGH (ref 70–99)
HEMATOCRIT: 41 % (ref 36.0–46.0)
Hemoglobin: 13.9 g/dL (ref 12.0–15.0)
Potassium: 3.4 mmol/L — ABNORMAL LOW (ref 3.5–5.1)
SODIUM: 139 mmol/L (ref 135–145)
TCO2: 21 mmol/L (ref 0–100)

## 2015-01-24 LAB — DIFFERENTIAL
BASOS ABS: 0 10*3/uL (ref 0.0–0.1)
BASOS PCT: 0 % (ref 0–1)
EOS ABS: 0.1 10*3/uL (ref 0.0–0.7)
Eosinophils Relative: 0 % (ref 0–5)
Lymphocytes Relative: 29 % (ref 12–46)
Lymphs Abs: 3.7 10*3/uL (ref 0.7–4.0)
Monocytes Absolute: 0.6 10*3/uL (ref 0.1–1.0)
Monocytes Relative: 5 % (ref 3–12)
NEUTROS ABS: 8.2 10*3/uL — AB (ref 1.7–7.7)
Neutrophils Relative %: 66 % (ref 43–77)

## 2015-01-24 LAB — COMPREHENSIVE METABOLIC PANEL
ALT: 20 U/L (ref 0–35)
ANION GAP: 11 (ref 5–15)
AST: 21 U/L (ref 0–37)
Albumin: 3.6 g/dL (ref 3.5–5.2)
Alkaline Phosphatase: 78 U/L (ref 39–117)
BILIRUBIN TOTAL: 0.3 mg/dL (ref 0.3–1.2)
BUN: 10 mg/dL (ref 6–23)
CO2: 23 mmol/L (ref 19–32)
Calcium: 9.4 mg/dL (ref 8.4–10.5)
Chloride: 103 mmol/L (ref 96–112)
Creatinine, Ser: 0.67 mg/dL (ref 0.50–1.10)
GFR calc Af Amer: >90 mL/min (ref >90)
GFR calc non Af Amer: >90 mL/min (ref >90)
GLUCOSE: 101 mg/dL — AB (ref 70–99)
POTASSIUM: 3.3 mmol/L — AB (ref 3.5–5.1)
Sodium: 137 mmol/L (ref 135–145)
Total Protein: 7.1 g/dL (ref 6.0–8.3)

## 2015-01-24 LAB — I-STAT TROPONIN, ED: TROPONIN I, POC: 0 ng/mL (ref 0.00–0.08)

## 2015-01-24 LAB — PROTIME-INR
INR: 0.93 (ref 0.00–1.49)
PROTHROMBIN TIME: 12.6 s (ref 11.6–15.2)

## 2015-01-24 LAB — RAPID URINE DRUG SCREEN, HOSP PERFORMED
Amphetamines: NOT DETECTED
BENZODIAZEPINES: POSITIVE — AB
Barbiturates: NOT DETECTED
COCAINE: NOT DETECTED
Opiates: POSITIVE — AB
TETRAHYDROCANNABINOL: NOT DETECTED

## 2015-01-24 LAB — POC URINE PREG, ED: PREG TEST UR: NEGATIVE

## 2015-01-24 LAB — APTT: aPTT: 32 seconds (ref 24–37)

## 2015-01-24 NOTE — ED Notes (Signed)
Pt returned from vascular lab. Negative for blood clot.

## 2015-01-24 NOTE — Progress Notes (Signed)
VASCULAR LAB PRELIMINARY  PRELIMINARY  PRELIMINARY  PRELIMINARY  Right upper extremity venous duplex completed.    Preliminary report:  Negative DVT Right upper extremity.  August Albino, RVT 01/24/2015, 5:17 PM

## 2015-01-24 NOTE — ED Notes (Signed)
Pt states her right arm "stopped working" yesterday about 1430. States she can't feel it. Was at MD office to get shot in her back and they were concerned about her BP being elevated and wanted her to come here. Pt having trouble getting out what she wants to say.

## 2015-01-24 NOTE — ED Notes (Signed)
Pt verbailzed understanding of d/c instructions and to follow up with neurology and has no further questions.

## 2015-01-24 NOTE — ED Notes (Signed)
Pt transported to MRI 

## 2015-01-24 NOTE — Consult Note (Signed)
NEURO HOSPITALIST CONSULT NOTE    Reason for Consult: transient right arm weakness  HPI:                                                                                                                                          Theresa Washington is an 34 y.o. female with chronic neck pain, fibromyalgia, somatization disorder, Von Willebrand Dz who has been followed closely by her PCP for her neck discomfort in the past.  She recently had a MRI of her spine in October 2015 which showed multi level DJD but no cord stenosis and no significant foraminal stenosis. At that time she was complaining of left arm and hand pain.  She states yesterday she had decreased strength in her right hand for about 3 hours--this full resolved. She was to get cervical facet injection today but due to her elevated BP she never received the injection. While she was in the office she states her RIGHT arm went limp and she lost sensation from her shoulder to her hand.  She was brought to ED.  While in ED her strength and sensation fully returned. Patietn states she had a hard time telling the nurse her PCP's name upon arrival but no further language issues. Currently she feels back to baseline.   Past Medical History  Diagnosis Date  . Von Willebrand disease   . Seizures   . Bell's palsy   . Back pain   . Degenerative disc disease   . DDD (degenerative disc disease)   . Spondylosis   . Fibromyalgia   . DDD (degenerative disc disease)   . Ankylosing spondylitis   . Fibromyalgia   . PVC (premature ventricular contraction)   . Palpitations   . Anemia   . Anxiety   . Kidney stone   . Myositis   . Lyme arthritis     Past Surgical History  Procedure Laterality Date  . Oophorectomy      Left side    Family History  Problem Relation Age of Onset  . Heart disease Father   . Kidney disease Father   . Alcohol abuse Brother   . Mental retardation Brother   . Colon cancer Maternal Grandmother       Social History:  reports that she has been smoking Cigarettes.  She has a 12 pack-year smoking history. She has never used smokeless tobacco. She reports that she does not drink alcohol or use illicit drugs.  Allergies  Allergen Reactions  . Shrimp [Shellfish Allergy] Anaphylaxis, Hives and Swelling  . Other     Pt states "I carry an Epipen. I had anaphylactic reaction to something but I don't know what".     MEDICATIONS:  No current facility-administered medications for this encounter.   Current Outpatient Prescriptions  Medication Sig Dispense Refill  . clobetasol ointment (TEMOVATE) 6.31 % Apply 1 application topically daily as needed (for rash).     . diazepam (VALIUM) 5 MG tablet Take 1 tablet (5 mg total) by mouth every 12 (twelve) hours as needed for anxiety. For muscle spasms 60 tablet 2  . diclofenac sodium (VOLTAREN) 1 % GEL Apply 1 application topically daily as needed. For pain 100 g 3  . hydrochlorothiazide (HYDRODIURIL) 25 MG tablet Take 0.5 tablets (12.5 mg total) by mouth daily. 30 tablet 5  . ibuprofen (ADVIL,MOTRIN) 800 MG tablet Take 800 mg by mouth every 8 (eight) hours as needed for mild pain or moderate pain.     . methylcellulose (ARTIFICIAL TEARS) 1 % ophthalmic solution Place 1 drop into both eyes daily as needed. Dry Eyes    . Milnacipran (SAVELLA) 50 MG TABS tablet Take 1 tablet (50 mg total) by mouth 2 (two) times daily. 60 tablet 5  . nebivolol (BYSTOLIC) 5 MG tablet Take 1 tablet (5 mg total) by mouth daily. 30 tablet 11  . NUVARING 0.12-0.015 MG/24HR vaginal ring INSERT 1 RING VAGINALLY CONTINUOUSLY X 4 WEEKS. 1 each 12  . oxyCODONE-acetaminophen (PERCOCET) 10-325 MG per tablet Take 2 tablets by mouth every 8 (eight) hours as needed for pain.    [name] Potassium Chloride ER 20 MEQ TBCR Take 20 mEq by mouth daily. 90 tablet 3  . tiZANidine  (ZANAFLEX) 4 MG tablet Take 4 mg by mouth 3 (three) times daily.  0  . fluticasone (FLONASE) 50 MCG/ACT nasal spray Place 2 sprays into both nostrils daily. (Patient not taking: Reported on 01/24/2015) 16 g 2  . predniSONE (DELTASONE) 20 MG tablet 3 tabs poqday 1-2, 2 tabs poqday 3-4, 1 tab poqday 5-6 (Patient not taking: Reported on 01/24/2015) 12 tablet 0      ROS:                                                                                                                                       History obtained from the patient  General ROS: negative for - chills, fatigue, fever, night sweats, weight gain or weight loss Psychological ROS: negative for - behavioral disorder, hallucinations, memory difficulties, mood swings or suicidal ideation Ophthalmic ROS: negative for - blurry vision, double vision, eye pain or loss of vision ENT ROS: negative for - epistaxis, nasal discharge, oral lesions, sore throat, tinnitus or vertigo Allergy and Immunology ROS: negative for - hives or itchy/watery eyes Hematological and Lymphatic ROS: negative for - bleeding problems, bruising or swollen lymph nodes Endocrine ROS: negative for - galactorrhea, hair pattern changes, polydipsia/polyuria or temperature intolerance Respiratory ROS: negative for - cough, hemoptysis, shortness of breath or wheezing Cardiovascular ROS: negative for - chest pain, dyspnea on exertion, edema or irregular heartbeat Gastrointestinal ROS: negative for - abdominal  pain, diarrhea, hematemesis, nausea/vomiting or stool incontinence Genito-Urinary ROS: negative for - dysuria, hematuria, incontinence or urinary frequency/urgency Musculoskeletal ROS: negative for - joint swelling or muscular weakness Neurological ROS: as noted in HPI Dermatological ROS: negative for rash and skin lesion changes   Blood pressure 125/65, pulse 77, temperature 98.2 F (36.8 C), temperature source Oral, resp. rate 14, height 5' (1.524 m), weight 68.04 kg  (150 lb), SpO2 99 %.   Neurologic Examination:                                                                                                      HEENT-  Normocephalic, no lesions, without obvious abnormality.  Normal external eye and conjunctiva.  Normal TM's bilaterally.  Normal auditory canals and external ears. Normal external nose, mucus membranes and septum.  Normal pharynx. Cardiovascular- S1, S2 normal, pulses palpable throughout   Lungs- chest clear, no wheezing, rales, normal symmetric air entry Abdomen- normal findings: bowel sounds normal Extremities- no edema Lymph-no adenopathy palpable Musculoskeletal-no joint tenderness, deformity or swelling Skin-warm and dry, no hyperpigmentation, vitiligo, or suspicious lesions  Neurological Examination Mental Status: Alert, oriented, thought content appropriate.  Speech fluent without evidence of aphasia.  Able to follow 3 step commands without difficulty. Cranial Nerves: II: Discs flat bilaterally; Visual fields grossly normal, pupils 4 mm equal, round, reactive to light and accommodation III,IV, VI: ptosis not present, extra-ocular motions intact bilaterally V,VII: smile symmetric, facial light touch sensation normal bilaterally VIII: hearing normal bilaterally IX,X: uvula rises symmetrically XI: bilateral shoulder shrug XII: midline tongue extension Motor: Right : Upper extremity   5/5    Left:     Upper extremity   5/5  Lower extremity   5/5     Lower extremity   5/5 --she shows effort dependent strength with shoulder abduction but when distracted she has full strength. Tone and bulk:normal tone throughout; no atrophy noted Sensory: Pinprick and light touch intact throughout, bilaterally Deep Tendon Reflexes: 2+ and symmetric throughout Plantars: Right: downgoing   Left: downgoing Cerebellar: normal finger-to-nose, normal rapid alternating movements and normal heel-to-shin test Gait: normal gait and station      Lab  Results: Basic Metabolic Panel:  Recent Labs Lab 01/24/15 1200 01/24/15 1220  NA 137 139  K 3.3* 3.4*  CL 103 101  CO2 23  --   GLUCOSE 101* 104*  BUN 10 10  CREATININE 0.67 0.60  CALCIUM 9.4  --     Liver Function Tests:  Recent Labs Lab 01/24/15 1200  AST 21  ALT 20  ALKPHOS 78  BILITOT 0.3  PROT 7.1  ALBUMIN 3.6   No results for input(s): LIPASE, AMYLASE in the last 168 hours. No results for input(s): AMMONIA in the last 168 hours.  CBC:  Recent Labs Lab 01/24/15 1200 01/24/15 1220  WBC 12.6*  --   NEUTROABS 8.2*  --   HGB 12.7 13.9  HCT 38.4 41.0  MCV 90.4  --   PLT 417*  --     Cardiac Enzymes: No results for input(s): CKTOTAL, CKMB, CKMBINDEX, TROPONINI  in the last 168 hours.  Lipid Panel: No results for input(s): CHOL, TRIG, HDL, CHOLHDL, VLDL, LDLCALC in the last 168 hours.  CBG: No results for input(s): GLUCAP in the last 168 hours.  Microbiology: Results for orders placed or performed in visit on 11/08/14  Urine culture     Status: None   Collection Time: 11/08/14  9:36 AM  Result Value Ref Range Status   Culture ESCHERICHIA COLI  Final   Colony Count >=100,000 COLONIES/ML  Final   Organism ID, Bacteria ESCHERICHIA COLI  Final      Susceptibility   Escherichia coli -  (no method available)    AMPICILLIN 4 Sensitive     AMOX/CLAVULANIC 4 Sensitive     AMPICILLIN/SULBACTAM <=2 Sensitive     PIP/TAZO <=4 Sensitive     IMIPENEM <=0.25 Sensitive     CEFAZOLIN <=4 Sensitive     CEFTRIAXONE <=1 Sensitive     CEFTAZIDIME <=1 Sensitive     CEFEPIME <=1 Sensitive     GENTAMICIN <=1 Sensitive     TOBRAMYCIN <=1 Sensitive     CIPROFLOXACIN <=0.25 Sensitive     LEVOFLOXACIN <=0.12 Sensitive     NITROFURANTOIN <=16 Sensitive     TRIMETH/SULFA <=20 Sensitive     Coagulation Studies:  Recent Labs  01/24/15 1200  LABPROT 12.6  INR 0.93    Imaging: Ct Head (brain) Wo Contrast  01/24/2015   CLINICAL DATA:  34 year old female with  right upper extremity weakness since yesterday. Abnormal speech. Initial encounter.  EXAM: CT HEAD WITHOUT CONTRAST  TECHNIQUE: Contiguous axial images were obtained from the base of the skull through the vertex without intravenous contrast.  COMPARISON:  Brain MRI 02/09/2010 and earlier.  FINDINGS: Visualized paranasal sinuses and mastoids are clear. Visualized orbit soft tissues are within normal limits. Visualized scalp soft tissues are within normal limits. No acute osseous abnormality identified.  Cerebral volume remains normal. No suspicious intracranial vascular hyperdensity. No midline shift, ventriculomegaly, mass effect, evidence of mass lesion, intracranial hemorrhage or evidence of cortically based acute infarction. Gray-white matter differentiation is within normal limits throughout the brain.  IMPRESSION: Stable and Normal noncontrast CT appearance of the brain.   Electronically Signed   By: Genevie Ann M.D.   On: 01/24/2015 12:57     Assessment and plan per attending neurologist  Etta Quill PA-C [facility] [phone]  01/24/2015, 2:46 PM   Assessment/Plan: 34 YO with transient right arm weakness and decreased sensation. On exam she has no focal or lateralizing deficits. She has normal reflexes of right tricep, bicep and brachioradialis. CT head shows no abnormalities. Given the fact patient does have Von willebrand disease which could place her at risk for CVA she would benefit from MRI brain and cervical spine.   Recommend: 1) MRI brain and cervical spine  --if negative further work up may be performed as a out patient.       Patient seen and examined together with physician assistant and I concur with the assessment and plan.  Dorian Pod, MD

## 2015-01-24 NOTE — ED Notes (Signed)
Patient states symptoms started yesterday.   Patient states went to doctor today who sent her here for stroke symptoms.   Patient came by POV.

## 2015-01-24 NOTE — ED Notes (Signed)
Pt still in MRI 

## 2015-01-24 NOTE — ED Provider Notes (Signed)
CSN: [id number]     Arrival date & time 01/24/15  1155 History   First MD Initiated Contact with Patient 01/24/15 1300     Chief Complaint  Patient presents with  . Extremity Weakness     (Consider location/radiation/quality/duration/timing/severity/associated sxs/prior Treatment) HPI   This is a 34 year old female with past medical history of von Willebrand's disease, cervical disc disease, fibromyalgia and myositis. The patient presents emergency Department with chief complaint of right arm weakness and numbness. Patient states that yesterday she had acute onset right arm numbness and weakness. She states she was unable to move or use the arm. She states that her symptoms seemed to improve in the morning her arm seemed to normal again. Patient was on her way to her pain management doctor for a facet injection when she had sudden numbness again. She states that her symptoms are currently rated predominantly resolved except for some residual weakness and numbness in the right upper extremity. She denies any changes in vision, difficulty swallowing. Patient seen in shared visit with Dr. Kathrynn Humble, and she does report difficulty with speech upon arrival today along with headache.  Past Medical History  Diagnosis Date  . Von Willebrand disease   . Seizures   . Bell's palsy   . Back pain   . Degenerative disc disease   . DDD (degenerative disc disease)   . Spondylosis   . Fibromyalgia   . DDD (degenerative disc disease)   . Ankylosing spondylitis   . Fibromyalgia   . PVC (premature ventricular contraction)   . Palpitations   . Anemia   . Anxiety   . Kidney stone   . Myositis   . Lyme arthritis    Past Surgical History  Procedure Laterality Date  . Oophorectomy      Left side   Family History  Problem Relation Age of Onset  . Heart disease Father   . Kidney disease Father   . Alcohol abuse Brother   . Mental retardation Brother   . Colon cancer Maternal Grandmother    History   Substance Use Topics  . Smoking status: Current Every Day Smoker -- 1.00 packs/day for 12 years    Types: Cigarettes  . Smokeless tobacco: Never Used  . Alcohol Use: No   OB History    No data available     Review of Systems  Ten systems reviewed and are negative for acute change, except as noted in the HPI.    Allergies  Shrimp and Other  Home Medications   Prior to Admission medications   Medication Sig Start Date End Date Taking? Authorizing Provider  clobetasol ointment (TEMOVATE) 1.75 % Apply 1 application topically daily as needed (for rash).    Yes Historical Provider, MD  diazepam (VALIUM) 5 MG tablet Take 1 tablet (5 mg total) by mouth every 12 (twelve) hours as needed for anxiety. For muscle spasms 10/21/14  Yes Susy Frizzle, MD  diclofenac sodium (VOLTAREN) 1 % GEL Apply 1 application topically daily as needed. For pain 11/24/14  Yes Susy Frizzle, MD  hydrochlorothiazide (HYDRODIURIL) 25 MG tablet Take 0.5 tablets (12.5 mg total) by mouth daily. 07/06/14  Yes Thompson Grayer, MD  ibuprofen (ADVIL,MOTRIN) 800 MG tablet Take 800 mg by mouth every 8 (eight) hours as needed for mild pain or moderate pain.    Yes Historical Provider, MD  methylcellulose (ARTIFICIAL TEARS) 1 % ophthalmic solution Place 1 drop into both eyes daily as needed. Dry Eyes   Yes  Historical Provider, MD  Milnacipran (SAVELLA) 50 MG TABS tablet Take 1 tablet (50 mg total) by mouth 2 (two) times daily. 11/21/14  Yes Susy Frizzle, MD  nebivolol (BYSTOLIC) 5 MG tablet Take 1 tablet (5 mg total) by mouth daily. 11/16/14  Yes Susy Frizzle, MD  NUVARING 0.12-0.015 MG/24HR vaginal ring INSERT 1 RING VAGINALLY CONTINUOUSLY X 4 WEEKS. 10/31/14  Yes Florian Buff, MD  oxyCODONE-acetaminophen (PERCOCET) 10-325 MG per tablet Take 2 tablets by mouth every 8 (eight) hours as needed for pain.   Yes Historical Provider, MD  Potassium Chloride ER 20 MEQ TBCR Take 20 mEq by mouth daily. 12/20/14  Yes Fay Records,  MD  tiZANidine (ZANAFLEX) 4 MG tablet Take 4 mg by mouth 3 (three) times daily. 01/07/15  Yes Historical Provider, MD  fluticasone (FLONASE) 50 MCG/ACT nasal spray Place 2 sprays into both nostrils daily. Patient not taking: Reported on 01/24/2015 10/13/13   Alycia Rossetti, MD  predniSONE (DELTASONE) 20 MG tablet 3 tabs poqday 1-2, 2 tabs poqday 3-4, 1 tab poqday 5-6 Patient not taking: Reported on 01/24/2015 11/24/14   Susy Frizzle, MD   BP 128/79 mmHg  Pulse 85  Temp(Src) 98.2 F (36.8 C) (Oral)  Resp 22  Ht 5' (1.524 m)  Wt 150 lb (68.04 kg)  BMI 29.30 kg/m2  SpO2 99% Physical Exam  Constitutional: She is oriented to person, place, and time. She appears well-developed and well-nourished. No distress.  HENT:  Head: Normocephalic and atraumatic.  Eyes: Conjunctivae are normal. No scleral icterus.  Neck: Normal range of motion.  Cardiovascular: Normal rate, regular rhythm and normal heart sounds.  Exam reveals no gallop and no friction rub.   No murmur heard. Pulmonary/Chest: Effort normal and breath sounds normal. No respiratory distress.  Abdominal: Soft. Bowel sounds are normal. She exhibits no distension and no mass. There is no tenderness. There is no guarding.  Musculoskeletal:  Mild swelling R wrist and hand  Neurological: She is alert and oriented to person, place, and time.  Speech is clear and goal oriented, follows commands Major Cranial nerves without deficit, no facial droop Decreased grip strength on the right hand. There is mild weakness in comparison to the left.Decreased sensation in the R upper extremity. DTRS EQUAL Moves extremities without ataxia, coordination intact Normal finger to nose and rapid alternating movements Neg romberg, no pronator drift Normal gait Normal heel-shin and balance   Skin: Skin is warm and dry. She is not diaphoretic.  Nursing note and vitals reviewed.   ED Course  Procedures (including critical care time) Labs Review Labs  Reviewed  CBC - Abnormal; Notable for the following:    WBC 12.6 (*)    Platelets 417 (*)    All other components within normal limits  DIFFERENTIAL - Abnormal; Notable for the following:    Neutro Abs 8.2 (*)    All other components within normal limits  COMPREHENSIVE METABOLIC PANEL - Abnormal; Notable for the following:    Potassium 3.3 (*)    Glucose, Bld 101 (*)    All other components within normal limits  I-STAT CHEM 8, ED - Abnormal; Notable for the following:    Potassium 3.4 (*)    Glucose, Bld 104 (*)    All other components within normal limits  PROTIME-INR  APTT  I-STAT TROPOININ, ED  POC URINE PREG, ED    Imaging Review Ct Head (brain) Wo Contrast  01/24/2015   CLINICAL DATA:  34 year old female  with right upper extremity weakness since yesterday. Abnormal speech. Initial encounter.  EXAM: CT HEAD WITHOUT CONTRAST  TECHNIQUE: Contiguous axial images were obtained from the base of the skull through the vertex without intravenous contrast.  COMPARISON:  Brain MRI 02/09/2010 and earlier.  FINDINGS: Visualized paranasal sinuses and mastoids are clear. Visualized orbit soft tissues are within normal limits. Visualized scalp soft tissues are within normal limits. No acute osseous abnormality identified.  Cerebral volume remains normal. No suspicious intracranial vascular hyperdensity. No midline shift, ventriculomegaly, mass effect, evidence of mass lesion, intracranial hemorrhage or evidence of cortically based acute infarction. Gray-white matter differentiation is within normal limits throughout the brain.  IMPRESSION: Stable and Normal noncontrast CT appearance of the brain.   Electronically Signed   By: Genevie Ann M.D.   On: 01/24/2015 12:57     EKG Interpretation None      MDM   Final diagnoses:  None    Patient seen in shared visit with Dr. Kathrynn Humble.  She has obvious deficits in the right arm. Patient also seen by neurology here. We have ordered MRI brain and C-spine.  Her workup is currently pending. CT of the head is negative. She has mild hypokalemia, UDS is positive for benzodiazepines and opiates. Of concern for possibility of thrombotic event considering her von Willebrand's and paradoxical clotting effects of the disorder. Given sign out to PA [employer]. She also has an upper extremity venous duplex pending   Margarita Mail, PA-C 01/24/15 [place], MD 01/24/15 1705

## 2015-01-24 NOTE — ED Provider Notes (Signed)
Patient received in sign out from PA Harris at shift change.  Briefly, 34 y.o. F with sudden onset of right arm numbness and weakness yesterday, somewhat improved but returned again.  Was seen by PCP today who sent her here for further eval of possible stroke.  During prior evaluation patient with noted weakness of RUE when compared with left.  CT head and lab work have been done which are reassuring.  UDS + for benzos and opiates which patient is prescribed.  Plan:  Neurology has evaluated patient and recommends MRI cervical spine and brain. Patient also has duplex of RUE pending for evaluation of DVT.  If all studies negative, patient will be discharged home with close PCP FU.  VASCULAR LAB PRELIMINARY PRELIMINARY PRELIMINARY PRELIMINARY  Right upper extremity venous duplex completed.   Preliminary report: Negative DVT Right upper extremity.  August Albino, RVT 01/24/2015, 5:17 PM  Results for orders placed or performed during the hospital encounter of 01/24/15  Protime-INR  Result Value Ref Range   Prothrombin Time 12.6 11.6 - 15.2 seconds   INR 0.93 0.00 - 1.49  APTT  Result Value Ref Range   aPTT 32 24 - 37 seconds  CBC  Result Value Ref Range   WBC 12.6 (H) 4.0 - 10.5 K/uL   RBC 4.25 3.87 - 5.11 MIL/uL   Hemoglobin 12.7 12.0 - 15.0 g/dL   HCT 38.4 36.0 - 46.0 %   MCV 90.4 78.0 - 100.0 fL   MCH 29.9 26.0 - 34.0 pg   MCHC 33.1 30.0 - 36.0 g/dL   RDW 13.1 11.5 - 15.5 %   Platelets 417 (H) 150 - 400 K/uL  Differential  Result Value Ref Range   Neutrophils Relative % 66 43 - 77 %   Neutro Abs 8.2 (H) 1.7 - 7.7 K/uL   Lymphocytes Relative 29 12 - 46 %   Lymphs Abs 3.7 0.7 - 4.0 K/uL   Monocytes Relative 5 3 - 12 %   Monocytes Absolute 0.6 0.1 - 1.0 K/uL   Eosinophils Relative 0 0 - 5 %   Eosinophils Absolute 0.1 0.0 - 0.7 K/uL   Basophils Relative 0 0 - 1 %   Basophils Absolute 0.0 0.0 - 0.1 K/uL  Comprehensive metabolic panel  Result Value Ref Range   Sodium 137  135 - 145 mmol/L   Potassium 3.3 (L) 3.5 - 5.1 mmol/L   Chloride 103 96 - 112 mmol/L   CO2 23 19 - 32 mmol/L   Glucose, Bld 101 (H) 70 - 99 mg/dL   BUN 10 6 - 23 mg/dL   Creatinine, Ser 0.67 0.50 - 1.10 mg/dL   Calcium 9.4 8.4 - 10.5 mg/dL   Total Protein 7.1 6.0 - 8.3 g/dL   Albumin 3.6 3.5 - 5.2 g/dL   AST 21 0 - 37 U/L   ALT 20 0 - 35 U/L   Alkaline Phosphatase 78 39 - 117 U/L   Total Bilirubin 0.3 0.3 - 1.2 mg/dL   GFR calc non Af Amer >90 >90 mL/min   GFR calc Af Amer >90 >90 mL/min   Anion gap 11 5 - 15  Urine rapid drug screen (hosp performed)  Result Value Ref Range   Opiates POSITIVE (A) NONE DETECTED   Cocaine NONE DETECTED NONE DETECTED   Benzodiazepines POSITIVE (A) NONE DETECTED   Amphetamines NONE DETECTED NONE DETECTED   Tetrahydrocannabinol NONE DETECTED NONE DETECTED   Barbiturates NONE DETECTED NONE DETECTED  I-stat troponin, ED (not  at [facility])  Result Value Ref Range   Troponin i, poc 0.00 0.00 - 0.08 ng/mL   Comment 3          I-Stat Chem 8, ED  Result Value Ref Range   Sodium 139 135 - 145 mmol/L   Potassium 3.4 (L) 3.5 - 5.1 mmol/L   Chloride 101 96 - 112 mmol/L   BUN 10 6 - 23 mg/dL   Creatinine, Ser 0.60 0.50 - 1.10 mg/dL   Glucose, Bld 104 (H) 70 - 99 mg/dL   Calcium, Ion 1.18 1.12 - 1.23 mmol/L   TCO2 21 0 - 100 mmol/L   Hemoglobin 13.9 12.0 - 15.0 g/dL   HCT 41.0 36.0 - 46.0 %  POC Urine Pregnancy, ED (not at [facility])  Result Value Ref Range   Preg Test, Ur NEGATIVE NEGATIVE   Ct Head (brain) Wo Contrast  01/24/2015   CLINICAL DATA:  34 year old female with right upper extremity weakness since yesterday. Abnormal speech. Initial encounter.  EXAM: CT HEAD WITHOUT CONTRAST  TECHNIQUE: Contiguous axial images were obtained from the base of the skull through the vertex without intravenous contrast.  COMPARISON:  Brain MRI 02/09/2010 and earlier.  FINDINGS: Visualized paranasal sinuses and mastoids are clear. Visualized orbit soft tissues are within normal  limits. Visualized scalp soft tissues are within normal limits. No acute osseous abnormality identified.  Cerebral volume remains normal. No suspicious intracranial vascular hyperdensity. No midline shift, ventriculomegaly, mass effect, evidence of mass lesion, intracranial hemorrhage or evidence of cortically based acute infarction. Gray-white matter differentiation is within normal limits throughout the brain.  IMPRESSION: Stable and Normal noncontrast CT appearance of the brain.   Electronically Signed   By: Genevie Ann M.D.   On: 01/24/2015 12:57   Mr Brain Wo Contrast  01/24/2015   CLINICAL DATA:  Right arm weakness  EXAM: MRI HEAD WITHOUT CONTRAST  TECHNIQUE: Multiplanar, multiecho pulse sequences of the brain and surrounding structures were obtained without intravenous contrast.  COMPARISON:  MRI head 09/07/2010.  CT head 01/24/2015  FINDINGS: Ventricle size is normal. Cerebral volume is normal. Craniocervical junction is normal. Pituitary is normal in size.  Negative for acute or chronic infarct  Negative for demyelinating disease.  Negative for hemorrhage or fluid collection  Negative for mass or edema.  No shift of the midline structures.  Paranasal sinuses clear.  IMPRESSION: Normal   Electronically Signed   By: Franchot Gallo M.D.   On: 01/24/2015 19:21   Mr Cervical Spine Wo Contrast  01/24/2015   CLINICAL DATA:  Chronic neck pain.  Right arm weakness  EXAM: MRI CERVICAL SPINE WITHOUT CONTRAST  TECHNIQUE: Multiplanar, multisequence MR imaging of the cervical spine was performed. No intravenous contrast was administered.  COMPARISON:  Cervical MRI 01/21/2014  FINDINGS: Normal cervical alignment.  Image quality degraded by mild motion  Negative for fracture or mass. No cord compression. Spinal cord signal normal. Craniocervical junction normal  C2-3:  Negative  C3-4: Disc degeneration and mild spurring, similar to the prior study. Mild facet degeneration.  C4-5:  Mild degenerative change without stenosis   C5-6:  Negative  C6-7:  Negative  C7-T1:  Negative  IMPRESSION: Mild cervical degenerative changes stable from 1 year ago. No acute abnormality or interval change.   Electronically Signed   By: Franchot Gallo M.D.   On: 01/24/2015 19:24    MRI's and duplex negative for acute findings.  Patient d/c with close OP neurology FU.  Copies of imaging and labwork  given for physician review.  Discussed plan with patient, he/she acknowledged understanding and agreed with plan of care.  Return precautions given for new or worsening symptoms.  Larene Pickett, PA-C 01/24/15 [id number]  Alfonzo Beers, MD 01/24/15 [phone]

## 2015-01-24 NOTE — Discharge Instructions (Signed)
Follow-up with [facility]-- call to schedule appt.   Copies of your labs and imaging studies attached on back for physician review. Return here as needed or new or worsening symptoms.

## 2015-01-24 NOTE — ED Notes (Signed)
Pt returned from MRI °

## 2015-03-01 ENCOUNTER — Ambulatory Visit: Payer: 59 | Admitting: Family Medicine

## 2015-03-06 ENCOUNTER — Ambulatory Visit: Payer: 59 | Admitting: Family Medicine

## 2015-03-06 ENCOUNTER — Encounter: Payer: Self-pay | Admitting: Family Medicine

## 2015-04-12 ENCOUNTER — Ambulatory Visit: Payer: 59 | Admitting: Physician Assistant

## 2015-04-12 ENCOUNTER — Encounter: Payer: Self-pay | Admitting: Family Medicine

## 2015-04-19 ENCOUNTER — Ambulatory Visit (INDEPENDENT_AMBULATORY_CARE_PROVIDER_SITE_OTHER): Payer: 59 | Admitting: Physician Assistant

## 2015-04-19 ENCOUNTER — Encounter: Payer: Self-pay | Admitting: Physician Assistant

## 2015-04-19 VITALS — BP 120/80 | HR 80 | Temp 98.9°F | Resp 19 | Wt 148.0 lb

## 2015-04-19 DIAGNOSIS — E162 Hypoglycemia, unspecified: Secondary | ICD-10-CM | POA: Diagnosis not present

## 2015-04-19 DIAGNOSIS — E876 Hypokalemia: Secondary | ICD-10-CM | POA: Diagnosis not present

## 2015-04-19 LAB — BASIC METABOLIC PANEL WITH GFR
BUN: 8 mg/dL (ref 6–23)
CALCIUM: 9.2 mg/dL (ref 8.4–10.5)
CO2: 23 meq/L (ref 19–32)
CREATININE: 0.68 mg/dL (ref 0.50–1.10)
Chloride: 103 mEq/L (ref 96–112)
GFR, Est Non African American: >89 mL/min
Glucose, Bld: 125 mg/dL — ABNORMAL HIGH (ref 70–99)
Potassium: 3.7 mEq/L (ref 3.5–5.3)
SODIUM: 139 meq/L (ref 135–145)

## 2015-04-19 LAB — HEMOGLOBIN A1C
Hgb A1c MFr Bld: 5.7 % — ABNORMAL HIGH (ref <5.7)
MEAN PLASMA GLUCOSE: 117 mg/dL — AB (ref <117)

## 2015-04-19 NOTE — Progress Notes (Signed)
Patient ID: Theresa Washington MRN: [id number], DOB: 11-20-1980, 34 y.o. Date of Encounter: 04/19/2015, 11:01 AM    Chief Complaint:  Chief Complaint  Patient presents with  . check up sugar    fasting     HPI: 34 y.o. year old white female says that "since she was 34 years old she has had some problems with hypoglycemia". Says that over the past 4 months she has had episodes of feeling shaky and sweaty. Says then she will drink some orange juice and eat some peanut butter crackers and the symptoms will resolve. States that she does not eat any breakfast. Says that she does eat lunch and dinner and occasionally may snack between with something like peanut butter crackers. Says that she wants to check her sugar. She ate dinner at 6 PM last night and has had no food or drink since then except for water. It is now 11:00 AM.  She says that she also has had low potassium in the past and wants to recheck this while checking blood work. That she was told to take potassium supplement and she is taking this as directed.  Says that she has also had low hemoglobin and hematocrit and would like to recheck this. Says that these of been low in the past but she doesn't know why because she does not have a monthly cycle.  No other complaints or concerns today.     Home Meds:   Outpatient Prescriptions Prior to Visit  Medication Sig Dispense Refill  . clobetasol ointment (TEMOVATE) 4.62 % Apply 1 application topically daily as needed (for rash).     . diazepam (VALIUM) 5 MG tablet Take 1 tablet (5 mg total) by mouth every 12 (twelve) hours as needed for anxiety. For muscle spasms 60 tablet 2  . diclofenac sodium (VOLTAREN) 1 % GEL Apply 1 application topically daily as needed. For pain 100 g 3  . fluticasone (FLONASE) 50 MCG/ACT nasal spray Place 2 sprays into both nostrils daily. 16 g 2  . hydrochlorothiazide (HYDRODIURIL) 25 MG tablet Take 0.5 tablets (12.5 mg total) by mouth daily. 30 tablet 5   . ibuprofen (ADVIL,MOTRIN) 800 MG tablet Take 800 mg by mouth every 8 (eight) hours as needed for mild pain or moderate pain.     . methylcellulose (ARTIFICIAL TEARS) 1 % ophthalmic solution Place 1 drop into both eyes daily as needed. Dry Eyes    . Milnacipran (SAVELLA) 50 MG TABS tablet Take 1 tablet (50 mg total) by mouth 2 (two) times daily. 60 tablet 5  . nebivolol (BYSTOLIC) 5 MG tablet Take 1 tablet (5 mg total) by mouth daily. 30 tablet 11  . NUVARING 0.12-0.015 MG/24HR vaginal ring INSERT 1 RING VAGINALLY CONTINUOUSLY X 4 WEEKS. 1 each 12  . oxyCODONE-acetaminophen (PERCOCET) 10-325 MG per tablet Take 2 tablets by mouth every 8 (eight) hours as needed for pain.    [name] Potassium Chloride ER 20 MEQ TBCR Take 20 mEq by mouth daily. 90 tablet 3  . tiZANidine (ZANAFLEX) 4 MG tablet Take 4 mg by mouth 3 (three) times daily.  0  . predniSONE (DELTASONE) 20 MG tablet 3 tabs poqday 1-2, 2 tabs poqday 3-4, 1 tab poqday 5-6 (Patient not taking: Reported on 01/24/2015) 12 tablet 0   No facility-administered medications prior to visit.    Allergies:  Allergies  Allergen Reactions  . Shrimp [Shellfish Allergy] Anaphylaxis, Hives and Swelling  . Other     Pt states "I carry  an Epipen. I had anaphylactic reaction to something but I don't know what".       Review of Systems: See HPI for pertinent ROS. All other ROS negative.    Physical Exam: Blood pressure 120/80, pulse 80, temperature 98.9 F (37.2 C), temperature source Oral, resp. rate 19, weight 148 lb (67.132 kg)., Body mass index is 28.9 kg/(m^2). General:  WNWD WF. Appears in no acute distress. Neck: Supple. No thyromegaly. No lymphadenopathy. Lungs: Clear bilaterally to auscultation without wheezes, rales, or rhonchi. Breathing is unlabored. Heart: Regular rhythm. No murmurs, rubs, or gallops. Msk:  Strength and tone normal for age. Extremities/Skin: Warm and dry.  Neuro: Alert and oriented X 3. Moves all extremities spontaneously.  Gait is normal. CNII-XII grossly in tact. Psych:  Responds to questions appropriately with a normal affect.     ASSESSMENT AND PLAN:  34 y.o. year old female with  1. Hypokalemia - BASIC METABOLIC PANEL WITH GFR  2. Hypoglycemia - BASIC METABOLIC PANEL WITH GFR - Hemoglobin A1c  Reviewed with her that hemoglobin and hematocrit were normal 01/24/15. Offered to repeat this, but she defers. Will check potassium level as well as glucose and A1c. Call her with results.   Signed, [address] [place], [place], BSFM 04/19/2015 11:01 AM

## 2015-06-12 ENCOUNTER — Other Ambulatory Visit: Payer: Self-pay | Admitting: Family Medicine

## 2015-06-12 MED ORDER — MILNACIPRAN HCL 50 MG PO TABS: 50.0000 mg | ORAL_TABLET | Freq: Two times a day (BID) | ORAL | Status: DC

## 2015-06-12 NOTE — Telephone Encounter (Signed)
Medication refilled per protocol. 

## 2015-09-01 ENCOUNTER — Ambulatory Visit (INDEPENDENT_AMBULATORY_CARE_PROVIDER_SITE_OTHER): Payer: 59 | Admitting: Family Medicine

## 2015-09-01 ENCOUNTER — Encounter: Payer: Self-pay | Admitting: Family Medicine

## 2015-09-01 VITALS — BP 182/84 | HR 72 | Temp 98.4°F | Resp 18 | Wt 137.0 lb

## 2015-09-01 DIAGNOSIS — R5382 Chronic fatigue, unspecified: Secondary | ICD-10-CM | POA: Diagnosis not present

## 2015-09-01 LAB — CBC WITH DIFFERENTIAL/PLATELET
Basophils Absolute: 0 10*3/uL (ref 0.0–0.1)
Basophils Relative: 0 % (ref 0–1)
Eosinophils Absolute: 0.2 10*3/uL (ref 0.0–0.7)
Eosinophils Relative: 2 % (ref 0–5)
HEMATOCRIT: 40.4 % (ref 36.0–46.0)
Hemoglobin: 13.5 g/dL (ref 12.0–15.0)
LYMPHS ABS: 3 10*3/uL (ref 0.7–4.0)
LYMPHS PCT: 29 % (ref 12–46)
MCH: 29.2 pg (ref 26.0–34.0)
MCHC: 33.4 g/dL (ref 30.0–36.0)
MCV: 87.4 fL (ref 78.0–100.0)
MPV: 9.6 fL (ref 8.6–12.4)
Monocytes Absolute: 0.4 10*3/uL (ref 0.1–1.0)
Monocytes Relative: 4 % (ref 3–12)
Neutro Abs: 6.7 10*3/uL (ref 1.7–7.7)
Neutrophils Relative %: 65 % (ref 43–77)
Platelets: 481 10*3/uL — ABNORMAL HIGH (ref 150–400)
RBC: 4.62 MIL/uL (ref 3.87–5.11)
RDW: 12.9 % (ref 11.5–15.5)
WBC: 10.3 10*3/uL (ref 4.0–10.5)

## 2015-09-01 NOTE — Progress Notes (Signed)
Subjective:    Patient ID: Theresa Washington, female    DOB: 08/13/1981, 34 y.o.   MRN: [id number]  HPI Since I lasr saw the patient, she has unintentionally lost 13 lbs.  She continues to have sever fatigue, lethargy, hypersomnolence at times, aching pains in her shoulders and hips.  She denies any fevers or chills. She denies any cough or sore throat. She denies any rash. She denies any nausea vomiting or diarrhea. She denies any chest pain or cough that is chronic. She denies any hemoptysis. She denies any nausea or vomiting or constipation. She denies any bright red blood in her stool or dark black tarry stools. She does not have regular menstrual cycles. She denies any pelvic pain. She denies any vaginal discharge.  Wt Readings from Last 3 Encounters:  09/01/15 137 lb (62.143 kg)  04/19/15 148 lb (67.132 kg)  01/24/15 150 lb (68.04 kg)   Past Medical History  Diagnosis Date  . Von Willebrand disease ([place])   . Seizures ([place])   . Bell's palsy   . Back pain   . Degenerative disc disease   . DDD (degenerative disc disease)   . Spondylosis   . Fibromyalgia   . DDD (degenerative disc disease)   . Ankylosing spondylitis ([place])   . Fibromyalgia   . PVC (premature ventricular contraction)   . Palpitations   . Anemia   . Anxiety   . Kidney stone   . Myositis   . Lyme arthritis [facility])    Past Surgical History  Procedure Laterality Date  . Oophorectomy      Left side   Current Outpatient Prescriptions on File Prior to Visit  Medication Sig Dispense Refill  . clobetasol ointment (TEMOVATE) 1.66 % Apply 1 application topically daily as needed (for rash).     . diazepam (VALIUM) 5 MG tablet Take 1 tablet (5 mg total) by mouth every 12 (twelve) hours as needed for anxiety. For muscle spasms 60 tablet 2  . diclofenac sodium (VOLTAREN) 1 % GEL Apply 1 application topically daily as needed. For pain 100 g 3  . fluticasone (FLONASE) 50 MCG/ACT nasal spray Place 2 sprays into both  nostrils daily. 16 g 2  . ibuprofen (ADVIL,MOTRIN) 800 MG tablet Take 800 mg by mouth every 8 (eight) hours as needed for mild pain or moderate pain.     . methylcellulose (ARTIFICIAL TEARS) 1 % ophthalmic solution Place 1 drop into both eyes daily as needed. Dry Eyes    . nebivolol (BYSTOLIC) 5 MG tablet Take 1 tablet (5 mg total) by mouth daily. 30 tablet 11  . NUVARING 0.12-0.015 MG/24HR vaginal ring INSERT 1 RING VAGINALLY CONTINUOUSLY X 4 WEEKS. 1 each 12  . oxyCODONE-acetaminophen (PERCOCET) 10-325 MG per tablet Take 2 tablets by mouth every 8 (eight) hours as needed for pain.    [name] tiZANidine (ZANAFLEX) 4 MG tablet Take 4 mg by mouth 3 (three) times daily.  0  . hydrochlorothiazide (HYDRODIURIL) 25 MG tablet Take 0.5 tablets (12.5 mg total) by mouth daily. (Patient not taking: Reported on 09/01/2015) 30 tablet 5  . Milnacipran (SAVELLA) 50 MG TABS tablet Take 1 tablet (50 mg total) by mouth 2 (two) times daily. (Patient not taking: Reported on 09/01/2015) 60 tablet 5  . Potassium Chloride ER 20 MEQ TBCR Take 20 mEq by mouth daily. (Patient not taking: Reported on 09/01/2015) 90 tablet 3   No current facility-administered medications on file prior to visit.   Allergies  Allergen  Reactions  . Shrimp [Shellfish Allergy] Anaphylaxis, Hives and Swelling  . Other     Pt states "I carry an Epipen. I had anaphylactic reaction to something but I don't know what".    Social History   Social History  . Marital Status: Married    Spouse Name: N/A  . Number of Children: N/A  . Years of Education: N/A   Occupational History  . Not on file.   Social History Main Topics  . Smoking status: Current Every Day Smoker -- 1.00 packs/day for 12 years    Types: Cigarettes  . Smokeless tobacco: Never Used  . Alcohol Use: No  . Drug Use: No  . Sexual Activity: Yes    Birth Control/ Protection: Inserts   Other Topics Concern  . Not on file   Social History Narrative     Review of Systems    All other systems reviewed and are negative.      Objective:   Physical Exam  Constitutional: She is oriented to person, place, and time. She appears well-developed and well-nourished. No distress.  Neck: Neck supple. No JVD present. No thyromegaly present.  Cardiovascular: Normal rate, regular rhythm and normal heart sounds.   No murmur heard. Pulmonary/Chest: Effort normal and breath sounds normal. No respiratory distress. She has no wheezes. She has no rales.  Abdominal: Soft. Bowel sounds are normal. She exhibits no distension. There is no tenderness. There is no rebound and no guarding.  Musculoskeletal: She exhibits no edema or tenderness.  Lymphadenopathy:    She has no cervical adenopathy.  Neurological: She is alert and oriented to person, place, and time. She has normal reflexes. No cranial nerve deficit. She exhibits normal muscle tone. Coordination normal.  Skin: Skin is warm. No rash noted. She is not diaphoretic. No erythema. No pallor.  Vitals reviewed.         Assessment & Plan:  Chronic fatigue - Plan: CBC with Differential/Platelet, COMPLETE METABOLIC PANEL WITH GFR, TSH, Sedimentation rate, B. burgdorfi antibodies by WB  Exam today is normal. I believe the patient suffers from chronic fatigue syndrome and fibromyalgia. However I will begin a diagnostic workup by checking a CBC, CMP, TSH, sedimentation rate, and Lyme titers. If lab work is normal, I will turn our attention to treating chronic fatigue and fibromyalgia.

## 2015-09-02 LAB — COMPLETE METABOLIC PANEL WITHOUT GFR
ALT: 9 U/L (ref 6–29)
AST: 11 U/L (ref 10–30)
Albumin: 4 g/dL (ref 3.6–5.1)
Alkaline Phosphatase: 80 U/L (ref 33–115)
BUN: 11 mg/dL (ref 7–25)
CO2: 21 mmol/L (ref 20–31)
Calcium: 9.4 mg/dL (ref 8.6–10.2)
Chloride: 105 mmol/L (ref 98–110)
Creat: 0.73 mg/dL (ref 0.50–1.10)
GFR, Est African American: >89 mL/min
GFR, Est Non African American: >89 mL/min
Glucose, Bld: 107 mg/dL — ABNORMAL HIGH (ref 70–99)
Potassium: 4.2 mmol/L (ref 3.5–5.3)
Sodium: 138 mmol/L (ref 135–146)
Total Bilirubin: 0.3 mg/dL (ref 0.2–1.2)
Total Protein: 6.9 g/dL (ref 6.1–8.1)

## 2015-09-02 LAB — TSH: TSH: 2.179 u[IU]/mL (ref 0.350–4.500)

## 2015-09-02 LAB — SEDIMENTATION RATE: Sed Rate: 38 mm/h — ABNORMAL HIGH (ref 0–20)

## 2015-09-03 LAB — LYME ABY, WSTRN BLT IGG & IGM W/BANDS
B BURGDORFERI IGM ABS (IB): NEGATIVE
B burgdorferi IgG Abs (IB): NEGATIVE
LYME DISEASE 18 KD IGG: NONREACTIVE
LYME DISEASE 28 KD IGG: NONREACTIVE
LYME DISEASE 30 KD IGG: NONREACTIVE
LYME DISEASE 41 KD IGM: REACTIVE — AB
LYME DISEASE 58 KD IGG: NONREACTIVE
Lyme Disease 23 kD IgG: NONREACTIVE
Lyme Disease 23 kD IgM: NONREACTIVE
Lyme Disease 39 kD IgG: NONREACTIVE
Lyme Disease 39 kD IgM: NONREACTIVE
Lyme Disease 41 kD IgG: NONREACTIVE
Lyme Disease 45 kD IgG: NONREACTIVE
Lyme Disease 66 kD IgG: NONREACTIVE
Lyme Disease 93 kD IgG: NONREACTIVE

## 2015-09-08 ENCOUNTER — Ambulatory Visit: Payer: 59 | Admitting: Family Medicine

## 2015-09-15 ENCOUNTER — Ambulatory Visit (INDEPENDENT_AMBULATORY_CARE_PROVIDER_SITE_OTHER): Payer: Medicaid Other | Admitting: Family Medicine

## 2015-09-15 ENCOUNTER — Encounter: Payer: Self-pay | Admitting: Family Medicine

## 2015-09-15 VITALS — BP 158/80 | HR 78 | Temp 99.0°F | Resp 14 | Ht 62.0 in | Wt 136.0 lb

## 2015-09-15 DIAGNOSIS — I10 Essential (primary) hypertension: Secondary | ICD-10-CM

## 2015-09-15 DIAGNOSIS — Z23 Encounter for immunization: Secondary | ICD-10-CM

## 2015-09-15 DIAGNOSIS — M797 Fibromyalgia: Secondary | ICD-10-CM | POA: Diagnosis not present

## 2015-09-15 MED ORDER — MILNACIPRAN HCL 50 MG PO TABS: 50.0000 mg | ORAL_TABLET | Freq: Two times a day (BID) | ORAL | Status: DC

## 2015-09-15 MED ORDER — HYDROCHLOROTHIAZIDE 25 MG PO TABS: 25.0000 mg | ORAL_TABLET | Freq: Every day | ORAL | Status: DC

## 2015-09-15 NOTE — Progress Notes (Signed)
Subjective:    Patient ID: Theresa Washington, female    DOB: July 05, 1981, 34 y.o.   MRN: [id number]  HPI 09/01/15 Since I lasr saw the patient, she has unintentionally lost 13 lbs.  She continues to have sever fatigue, lethargy, hypersomnolence at times, aching pains in her shoulders and hips.  She denies any fevers or chills. She denies any cough or sore throat. She denies any rash. She denies any nausea vomiting or diarrhea. She denies any chest pain or cough that is chronic. She denies any hemoptysis. She denies any nausea or vomiting or constipation. She denies any bright red blood in her stool or dark black tarry stools. She does not have regular menstrual cycles. She denies any pelvic pain. She denies any vaginal discharge.  Wt Readings from Last 3 Encounters:  09/01/15 137 lb (62.143 kg)  04/19/15 148 lb (67.132 kg)  01/24/15 150 lb (68.04 kg)   At that time, my plan was: Exam today is normal. I believe the patient suffers from chronic fatigue syndrome and fibromyalgia. However I will begin a diagnostic workup by checking a CBC, CMP, TSH, sedimentation rate, and Lyme titers. If lab work is normal, I will turn our attention to treating chronic fatigue and fibromyalgia.  09/15/15 Patient's lab work is listed below and thankfully returned completely normal: Office Visit on 09/01/2015  Component Date Value Ref Range Status  . WBC 09/01/2015 10.3  4.0 - 10.5 K/uL Final  . RBC 09/01/2015 4.62  3.87 - 5.11 MIL/uL Final  . Hemoglobin 09/01/2015 13.5  12.0 - 15.0 g/dL Final  . HCT 09/01/2015 40.4  36.0 - 46.0 % Final  . MCV 09/01/2015 87.4  78.0 - 100.0 fL Final  . MCH 09/01/2015 29.2  26.0 - 34.0 pg Final  . MCHC 09/01/2015 33.4  30.0 - 36.0 g/dL Final  . RDW 09/01/2015 12.9  11.5 - 15.5 % Final  . Platelets 09/01/2015 481* 150 - 400 K/uL Final  . MPV 09/01/2015 9.6  8.6 - 12.4 fL Final  . Neutrophils Relative % 09/01/2015 65  43 - 77 % Final  . Neutro Abs 09/01/2015 6.7  1.7 - 7.7  K/uL Final  . Lymphocytes Relative 09/01/2015 29  12 - 46 % Final  . Lymphs Abs 09/01/2015 3.0  0.7 - 4.0 K/uL Final  . Monocytes Relative 09/01/2015 4  3 - 12 % Final  . Monocytes Absolute 09/01/2015 0.4  0.1 - 1.0 K/uL Final  . Eosinophils Relative 09/01/2015 2  0 - 5 % Final  . Eosinophils Absolute 09/01/2015 0.2  0.0 - 0.7 K/uL Final  . Basophils Relative 09/01/2015 0  0 - 1 % Final  . Basophils Absolute 09/01/2015 0.0  0.0 - 0.1 K/uL Final  . Smear Review 09/01/2015 Criteria for review not met   Final  . Sodium 09/01/2015 138  135 - 146 mmol/L Final  . Potassium 09/01/2015 4.2  3.5 - 5.3 mmol/L Final  . Chloride 09/01/2015 105  98 - 110 mmol/L Final  . CO2 09/01/2015 21  20 - 31 mmol/L Final  . Glucose, Bld 09/01/2015 107* 70 - 99 mg/dL Final  . BUN 09/01/2015 11  7 - 25 mg/dL Final  . Creat 09/01/2015 0.73  0.50 - 1.10 mg/dL Final  . Total Bilirubin 09/01/2015 0.3  0.2 - 1.2 mg/dL Final  . Alkaline Phosphatase 09/01/2015 80  33 - 115 U/L Final  . AST 09/01/2015 11  10 - 30 U/L Final  . ALT 09/01/2015 9  6 - 29 U/L Final  . Total Protein 09/01/2015 6.9  6.1 - 8.1 g/dL Final  . Albumin 09/01/2015 4.0  3.6 - 5.1 g/dL Final  . Calcium 09/01/2015 9.4  8.6 - 10.2 mg/dL Final  . GFR, Est African American 09/01/2015 >89  >=60 mL/min Final  . GFR, Est Non African American 09/01/2015 >89  >=60 mL/min Final   Comment:   The estimated GFR is a calculation valid for adults (>=36 years old) that uses the CKD-EPI algorithm to adjust for age and sex. It is   not to be used for children, pregnant women, hospitalized patients,    patients on dialysis, or with rapidly changing kidney function. According to the NKDEP, eGFR >89 is normal, 60-89 shows mild impairment, 30-59 shows moderate impairment, 15-29 shows severe impairment and <15 is ESRD.     [name] TSH 09/01/2015 2.179  0.350 - 4.500 uIU/mL Final  . Sed Rate 09/01/2015 38* 0 - 20 mm/hr Final  . B burgdorferi IgG Abs (IB) 09/01/2015 Negative    Final  . Lyme Disease 18 kD IgG 09/01/2015 Non Reactive   Final  . Lyme Disease 23 kD IgG 09/01/2015 Non Reactive   Final  . Lyme Disease 28 kD IgG 09/01/2015 Non Reactive   Final  . Lyme Disease 30 kD IgG 09/01/2015 Non Reactive   Final  . Lyme Disease 39 kD IgG 09/01/2015 Non Reactive   Final  . Lyme Disease 41 kD IgG 09/01/2015 Non Reactive   Final  . Lyme Disease 45 kD IgG 09/01/2015 Non Reactive   Final  . Lyme Disease 58 kD IgG 09/01/2015 Non Reactive   Final  . Lyme Disease 66 kD IgG 09/01/2015 Non Reactive   Final  . Lyme Disease 93 kD IgG 09/01/2015 Non Reactive   Final   Comment:    ** Reference Range for each analyte: Negative or Non Reactive **    IgG Positive: Any five of the following ten bands: 18, 23, 28, 30,  39, 41, 45, 58, 66, 93 kDa.  IgG Negative: Any pattern that does not meet the IgG positive  criteria.   . B burgdorferi IgM Abs (IB) 09/01/2015 Negative   Final  . Lyme Disease 23 kD IgM 09/01/2015 Non Reactive   Final  . Lyme Disease 39 kD IgM 09/01/2015 Non Reactive   Final  . Lyme Disease 41 kD IgM 09/01/2015 Reactive*  Final   Comment:    ** Reference Range for each analyte: Negative or Non Reactive **    IgM Positive: Any two of the following three bands: 23, 39, 41 kDa.  IgM Negative: Any pattern that does not meet the IgM positive  criteria. A positive IgM test result alone is not recommended for use in determining active disease due to a high likelihood of a false-positive result in persons with illness greater than 1 month duration. The CDC recommends testing by the EIA antibody test, followed by the Immunoblot confirmatory test only when the EIA test is positive.    Patient did not keep her last appointment. She is here today now to discuss lab work and for follow-up.   Come to find out, the patient has discontinued all of her medication except for the bystolic.   Her blood pressure today is still elevated at 158/80. The patient quit her Savella  early April. She is interested in starting that back Past Medical History  Diagnosis Date  . Von Willebrand disease ([place])   . Seizures ([place])   .  Bell's palsy   . Back pain   . Degenerative disc disease   . DDD (degenerative disc disease)   . Spondylosis   . Fibromyalgia   . DDD (degenerative disc disease)   . Ankylosing spondylitis ([place])   . Fibromyalgia   . PVC (premature ventricular contraction)   . Palpitations   . Anemia   . Anxiety   . Kidney stone   . Myositis   . 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 Oophorectomy      Left side   Current Outpatient Prescriptions on File Prior to Visit  Medication Sig Dispense Refill  . clobetasol ointment (TEMOVATE) 1.06 % Apply 1 application topically daily as needed (for rash).     . diazepam (VALIUM) 5 MG tablet Take 1 tablet (5 mg total) by mouth every 12 (twelve) hours as needed for anxiety. For muscle spasms 60 tablet 2  . diclofenac sodium (VOLTAREN) 1 % GEL Apply 1 application topically daily as needed. For pain 100 g 3  . fluticasone (FLONASE) 50 MCG/ACT nasal spray Place 2 sprays into both nostrils daily. 16 g 2  . hydrochlorothiazide (HYDRODIURIL) 25 MG tablet Take 0.5 tablets (12.5 mg total) by mouth daily. (Patient not taking: Reported on 09/01/2015) 30 tablet 5  . ibuprofen (ADVIL,MOTRIN) 800 MG tablet Take 800 mg by mouth every 8 (eight) hours as needed for mild pain or moderate pain.     . methylcellulose (ARTIFICIAL TEARS) 1 % ophthalmic solution Place 1 drop into both eyes daily as needed. Dry Eyes    . Milnacipran (SAVELLA) 50 MG TABS tablet Take 1 tablet (50 mg total) by mouth 2 (two) times daily. (Patient not taking: Reported on 09/01/2015) 60 tablet 5  . nebivolol (BYSTOLIC) 5 MG tablet Take 1 tablet (5 mg total) by mouth daily. 30 tablet 11  . NUVARING 0.12-0.015 MG/24HR vaginal ring INSERT 1 RING VAGINALLY CONTINUOUSLY X 4 WEEKS. 1 each 12  . oxyCODONE-acetaminophen (PERCOCET)  10-325 MG per tablet Take 2 tablets by mouth every 8 (eight) hours as needed for pain.    [name] Potassium Chloride ER 20 MEQ TBCR Take 20 mEq by mouth daily. (Patient not taking: Reported on 09/01/2015) 90 tablet 3  . tiZANidine (ZANAFLEX) 4 MG tablet Take 4 mg by mouth 3 (three) times daily.  0   No current facility-administered medications on file prior to visit.   Allergies  Allergen Reactions  . Shrimp [Shellfish Allergy] Anaphylaxis, Hives and Swelling  . Other     Pt states "I carry an Epipen. I had anaphylactic reaction to something but I don't know what".    Social History   Social History  . Marital Status: Married    Spouse Name: N/A  . Number of Children: N/A  . Years of Education: N/A   Occupational History  . Not on file.   Social History Main Topics  . Smoking status: Current Every Day Smoker -- 1.00 packs/day for 12 years    Types: Cigarettes  . Smokeless tobacco: Never Used  . Alcohol Use: No  . Drug Use: No  . Sexual Activity: Yes    Birth Control/ Protection: Inserts   Other Topics Concern  . Not on file   Social History Narrative     Review of Systems  All other systems reviewed and are negative.      Objective:   Physical Exam  Constitutional: She is oriented to person, place, and time. She  appears well-developed and well-nourished. No distress.  Neck: Neck supple. No JVD present. No thyromegaly present.  Cardiovascular: Normal rate, regular rhythm and normal heart sounds.   No murmur heard. Pulmonary/Chest: Effort normal and breath sounds normal. No respiratory distress. She has no wheezes. She has no rales.  Abdominal: Soft. Bowel sounds are normal. She exhibits no distension. There is no tenderness. There is no rebound and no guarding.  Musculoskeletal: She exhibits no edema or tenderness.  Lymphadenopathy:    She has no cervical adenopathy.  Neurological: She is alert and oriented to person, place, and time. She has normal reflexes. No  cranial nerve deficit. She exhibits normal muscle tone. Coordination normal.  Skin: Skin is warm. No rash noted. She is not diaphoretic. No erythema. No pallor.  Vitals reviewed.         Assessment & Plan:  Benign essential HTN - Plan: hydrochlorothiazide (HYDRODIURIL) 25 MG tablet  Fibromyalgia - Plan: Milnacipran (SAVELLA) 50 MG TABS tablet  Need for prophylactic vaccination and inoculation against influenza - Plan: Flu Vaccine QUAD 36+ mos IM   blood pressure still elevated. Resume hydrochlorothiazide and recheck blood pressure in one month. Resume Savella. Begin 25 mg everyday for one week then increase to 50 mg a day for 1 week then increase to 50 mg by mouth twice a day thereafter. Recheck in 2 months. Consider Lyrica if pain persists. We could also consider Provigil for chronic fatigue syndrome. However I am very hesitant to start her on Provigil given her elevated blood pressure and history of tachycardia

## 2015-11-08 ENCOUNTER — Other Ambulatory Visit: Payer: Self-pay | Admitting: Obstetrics & Gynecology

## 2015-11-30 ENCOUNTER — Ambulatory Visit (INDEPENDENT_AMBULATORY_CARE_PROVIDER_SITE_OTHER): Payer: Medicaid Other | Admitting: Adult Health

## 2015-11-30 ENCOUNTER — Other Ambulatory Visit (HOSPITAL_COMMUNITY)
Admission: RE | Admit: 2015-11-30 | Discharge: 2015-11-30 | Disposition: A | Discharge status: Home / Self Care | Payer: Medicaid Other | Source: Ambulatory Visit | Attending: Adult Health | Admitting: Adult Health

## 2015-11-30 ENCOUNTER — Encounter: Payer: Self-pay | Admitting: Adult Health

## 2015-11-30 VITALS — BP 130/80 | HR 88 | Ht 61.0 in | Wt 133.5 lb

## 2015-11-30 DIAGNOSIS — Z01419 Encounter for gynecological examination (general) (routine) without abnormal findings: Secondary | ICD-10-CM | POA: Insufficient documentation

## 2015-11-30 DIAGNOSIS — Z309 Encounter for contraceptive management, unspecified: Secondary | ICD-10-CM

## 2015-11-30 DIAGNOSIS — Z0000 Encounter for general adult medical examination without abnormal findings: Secondary | ICD-10-CM | POA: Diagnosis not present

## 2015-11-30 DIAGNOSIS — Z1151 Encounter for screening for human papillomavirus (HPV): Secondary | ICD-10-CM | POA: Insufficient documentation

## 2015-11-30 DIAGNOSIS — D6800 Von Willebrand disease, unspecified: Secondary | ICD-10-CM

## 2015-11-30 DIAGNOSIS — Z124 Encounter for screening for malignant neoplasm of cervix: Secondary | ICD-10-CM

## 2015-11-30 DIAGNOSIS — F17200 Nicotine dependence, unspecified, uncomplicated: Secondary | ICD-10-CM

## 2015-11-30 DIAGNOSIS — Z3049 Encounter for surveillance of other contraceptives: Secondary | ICD-10-CM

## 2015-11-30 HISTORY — DX: Nicotine dependence, unspecified, uncomplicated: F17.200

## 2015-11-30 HISTORY — DX: Encounter for contraceptive management, unspecified: Z30.9

## 2015-11-30 MED ORDER — ETONOGESTREL-ETHINYL ESTRADIOL 0.12-0.015 MG/24HR VA RING: VAGINAL_RING | VAGINAL | Status: DC

## 2015-11-30 NOTE — Patient Instructions (Signed)
Can use nuva ring til 35 Return in 4 weeks for pre op with JV[facility] Physical in 1 year pap in 3 if normal

## 2015-11-30 NOTE — Progress Notes (Signed)
Patient ID: Theresa Washington, female   DOB: 06-28-1981, 35 y.o.   MRN: [id number] History of Present Illness: Theresa Washington is a 35 year old white female, married in for well woman gyn exam and pap, she is a smoker is using nuvaring continuously to not bleed, has Von Willebrand's.Has DJD, arthritis and fibromyalgia, and has not worked in 3 years. PCP is Dr Dennard Schaumann.   Current Medications, Allergies, Past Medical History, Past Surgical History, Family History and Social History were reviewed in [employer] record.     Review of Systems: Patient denies any headaches, hearing loss, fatigue, blurred vision, shortness of breath, chest pain, abdominal pain, problems with bowel movements, urination, or intercourse. No mood swings.See HPI for positives.    Physical Exam:BP 130/80 mmHg  Pulse 88  Ht 5\' 1"  (1.549 m)  Wt 133 lb 8 oz (60.555 kg)  BMI 25.24 kg/m2  LMP  General:  Well developed, well nourished, no acute distress Skin:  Warm and dry Neck:  Midline trachea, normal thyroid, good ROM, no lymphadenopathy Lungs; Clear to auscultation bilaterally Breast:  No dominant palpable mass, retraction, or nipple discharge Cardiovascular: Regular rate and rhythm Abdomen:  Soft, non tender, no hepatosplenomegaly Pelvic:  External genitalia is normal in appearance, no lesions.  The vagina is normal in appearance, nuva ring in place. Urethra has no lesions or masses. The cervix is everted at os and bulbous.Pap with HPV performed.  Uterus is felt to be normal size, shape, and contour.  No adnexal masses or tenderness noted.Bladder is non tender, no masses. Extremities/musculoskeletal:  No swelling or varicosities noted, no clubbing or cyanosis Psych:  No mood changes, alert and cooperative,seems happy Discussed that she is getting ready to be 35, and smokes needs to make decisions, can't continue estrogen and progesterone birth control, has used IUD in past, did not like, and may bleed  with POP, so discussed tubal and ablation and she agrees that is good choice.Tubal papers signed.   Impression: Well woman gyn exam and pap Contraceptive management Von Willebrand's Smoker     Plan: Review handout on tubal and ablation Refilled nuva ring x 2 only Return in 4 weeks for pre op with Dr Glo Herring Physical in 1 year, pap in 3 if normal  F/U with Dr Dennard Schaumann for labs

## 2015-12-05 LAB — CYTOLOGY - PAP

## 2016-01-01 ENCOUNTER — Ambulatory Visit (INDEPENDENT_AMBULATORY_CARE_PROVIDER_SITE_OTHER): Payer: Medicare Other | Admitting: Obstetrics and Gynecology

## 2016-01-01 VITALS — BP 104/70 | Ht 62.0 in | Wt 134.0 lb

## 2016-01-01 DIAGNOSIS — Z3009 Encounter for other general counseling and advice on contraception: Secondary | ICD-10-CM | POA: Insufficient documentation

## 2016-01-01 NOTE — Progress Notes (Signed)
Patient ID: Theresa Washington, female   DOB: 26-Aug-1981, 35 y.o.   MRN: [id number]  Preoperative History and Physical  Theresa Washington is a 35 y.o. G2P2 here for surgical management of sterilization.   No significant preoperative concerns. Patient has been using a Nuva-Ring for contraception. She states she was recommended an endometrial ablation due to [place] disease. Which has been well controlled by ocp's taken continuously  Proposed surgery: Bilateral tubal ligation  Past Medical History  Diagnosis Date  . Von Willebrand disease ([place])   . Seizures ([place])   . Bell's palsy   . Back pain   . Degenerative disc disease   . DDD (degenerative disc disease)   . Spondylosis   . Fibromyalgia   . DDD (degenerative disc disease)   . Ankylosing spondylitis ([place])   . Fibromyalgia   . PVC (premature ventricular contraction)   . Palpitations   . Anemia   . Anxiety   . Kidney stone   . Myositis   . 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 Contraceptive management 11/30/2015  . Smoker 11/30/2015   Past Surgical History  Procedure Laterality Date  . Oophorectomy      Left side   OB History  Gravida Para Term Preterm AB SAB TAB Ectopic Multiple Living  2 2        2     # Outcome Date GA Lbr Len/2nd Weight Sex Delivery Anes PTL Lv  2 Para           1 Para             Patient denies any other pertinent gynecologic issues.   Current Outpatient Prescriptions on File Prior to Visit  Medication Sig Dispense Refill  . clobetasol ointment (TEMOVATE) [id number] % Apply 1 application topically daily as needed (for rash).     . diclofenac sodium (VOLTAREN) 1 % GEL Apply 1 application topically daily as needed. For pain 100 g 3  . etonogestrel-ethinyl estradiol (NUVARING) 0.12-0.015 MG/24HR vaginal ring insert 1 ring vaginally for 3 weeks REMOVE for 1 week and repeat 1 each 2  . hydrochlorothiazide (HYDRODIURIL) 25 MG tablet Take 1 tablet (25 mg total) by mouth daily. 90 tablet 3  . ibuprofen  (ADVIL,MOTRIN) 800 MG tablet Take 800 mg by mouth every 8 (eight) hours as needed for mild pain or moderate pain.     . methylcellulose (ARTIFICIAL TEARS) 1 % ophthalmic solution Place 1 drop into both eyes daily as needed. Dry Eyes    . nebivolol (BYSTOLIC) 5 MG tablet Take 1 tablet (5 mg total) by mouth daily. 30 tablet 11   No current facility-administered medications on file prior to visit.   Allergies  Allergen Reactions  . Shrimp [Shellfish Allergy] Anaphylaxis, Hives and Swelling  . Other     Pt states "I carry an Epipen. I had anaphylactic reaction to something but I don't know what".     Social History:   reports that she has been smoking Cigarettes.  She has a 20 pack-year smoking history. She has never used smokeless tobacco. She reports that she does not drink alcohol or use illicit drugs.  Family History  Problem Relation Age of Onset  . Heart disease Father   . Kidney disease Father   . Alcohol abuse Brother   . Mental retardation Brother   . Schizophrenia Brother   . Colon cancer Maternal Grandmother   . Other Brother     back problems    Review of  Systems: Noncontributory  PHYSICAL EXAM: To be done at preop   Labs: No results found for this or any previous visit (from the past 336 hour(s)).  Imaging Studies: No results found.  Assessment: Patient Active Problem List   Diagnosis Date Noted  . Contraceptive management 11/30/2015  . Smoker 11/30/2015  . Palpitations 04/27/2014  . Pre-syncope 04/26/2014  . Near syncope 04/26/2014  . Chest pain 04/26/2014  . Hip pain 12/12/2013  . Acute sinusitis 10/15/2013  . Fibromyalgia 02/10/2013  . Somatization disorder 02/10/2013  . Pseudoseizures 02/10/2013  . DDD (degenerative disc disease), lumbosacral 02/10/2013  . Von Willebrand disease ([place]) 02/10/2013  . SMOKER 05/20/2010  . SORE THROAT 05/20/2010  . ACUTE BRONCHITIS 05/20/2010  . CHRONIC OBSTRUCTIVE PULMONARY DISEASE 05/20/2010  . FOREIGN BODY, EAR,  RIGHT 05/20/2010  . SHOULDER PAIN 10/27/2008  . NECK PAIN 10/27/2008    Plan: Patient will undergo surgical management with bilateral tubal ligation. , and endometial ablation, to be done weth in 1 month   .mec 01/01/2016 2:27 PM    By signing my name below, I, Stephania Fragmin, attest that this documentation has been prepared under the direction and in the presence of Jonnie Kind, MD. Electronically Signed: Stephania Fragmin, ED Scribe. 01/01/2016. 2:33 PM.  I personally performed the services described in this documentation, which was SCRIBED in my presence. The recorded information has been reviewed and considered accurate. It has been edited as necessary during review. Jonnie Kind, MD

## 2016-01-10 ENCOUNTER — Ambulatory Visit (INDEPENDENT_AMBULATORY_CARE_PROVIDER_SITE_OTHER): Payer: Medicare Other | Admitting: Obstetrics and Gynecology

## 2016-01-10 ENCOUNTER — Encounter: Payer: Self-pay | Admitting: Obstetrics and Gynecology

## 2016-01-10 VITALS — BP 110/70 | HR 74 | Ht 63.0 in | Wt 138.0 lb

## 2016-01-10 DIAGNOSIS — N920 Excessive and frequent menstruation with regular cycle: Secondary | ICD-10-CM

## 2016-01-10 DIAGNOSIS — Z01818 Encounter for other preprocedural examination: Secondary | ICD-10-CM | POA: Diagnosis not present

## 2016-01-10 DIAGNOSIS — Z302 Encounter for sterilization: Secondary | ICD-10-CM

## 2016-01-10 DIAGNOSIS — Z3009 Encounter for other general counseling and advice on contraception: Secondary | ICD-10-CM

## 2016-01-10 NOTE — Progress Notes (Signed)
Patient ID: Theresa Washington, female   DOB: [birth year], 35 y.o.   MRN: [id number] Preoperative History and Physical  Theresa Washington is a 35 y.o. G2P2 here for surgical management of sterilization. No significant preoperative concerns.  Proposed surgery: laparoscopic bilateral salpingectomy, endometrial ablation   Past Medical History  Diagnosis Date  . Von Willebrand disease ([place])   . Seizures ([place])   . Bell's palsy   . Back pain   . Degenerative disc disease   . DDD (degenerative disc disease)   . Spondylosis   . Fibromyalgia   . DDD (degenerative disc disease)   . Ankylosing spondylitis ([place])   . Fibromyalgia   . PVC (premature ventricular contraction)   . Palpitations   . Anemia   . Anxiety   . Kidney stone   . Myositis   . 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 Contraceptive management 11/30/2015  . Smoker 11/30/2015   Past Surgical History  Procedure Laterality Date  . Oophorectomy      Left side   OB History  Gravida Para Term Preterm AB SAB TAB Ectopic Multiple Living  2 2        2     # Outcome Date GA Lbr Len/2nd Weight Sex Delivery Anes PTL Lv  2 Para           1 Para             Patient denies any other pertinent gynecologic issues.   Current Outpatient Prescriptions on File Prior to Visit  Medication Sig Dispense Refill  . Aspirin-Salicylamide-Caffeine (BC FAST PAIN RELIEF ARTHRITIS) [id number] MG PACK Take 1 packet by mouth daily.    . clobetasol ointment (TEMOVATE) [id number] % Apply 1 application topically daily as needed (for rash).     . diclofenac sodium (VOLTAREN) 1 % GEL Apply 1 application topically daily as needed. For pain 100 g 3  . etonogestrel-ethinyl estradiol (NUVARING) 0.12-0.015 MG/24HR vaginal ring insert 1 ring vaginally for 3 weeks REMOVE for 1 week and repeat 1 each 2  . hydrochlorothiazide (HYDRODIURIL) 25 MG tablet Take 1 tablet (25 mg total) by mouth daily. 90 tablet 3  . methylcellulose (ARTIFICIAL TEARS) 1 % ophthalmic solution Place 1 drop  into both eyes daily as needed. Dry Eyes    . nebivolol (BYSTOLIC) 5 MG tablet Take 1 tablet (5 mg total) by mouth daily. 30 tablet 11   No current facility-administered medications on file prior to visit.   Allergies  Allergen Reactions  . Shrimp [Shellfish Allergy] Anaphylaxis, Hives and Swelling  . Other     Pt states "I carry an Epipen. I had anaphylactic reaction to something but I don't know what".     Social History:   reports that she has been smoking Cigarettes.  She has a 20 pack-year smoking history. She has never used smokeless tobacco. She reports that she does not drink alcohol or use illicit drugs.  Family History  Problem Relation Age of Onset  . Heart disease Father   . Kidney disease Father   . Alcohol abuse Brother   . Mental retardation Brother   . Schizophrenia Brother   . Colon cancer Maternal Grandmother   . Other Brother     back problems    Review of Systems: Noncontributory  PHYSICAL EXAM: Blood pressure 110/70, pulse 74, height 5\' 3"  (1.6 m), weight 138 lb (62.596 kg). General appearance - alert, well appearing, and in no distress Chest - clear to auscultation, no wheezes, rales  or rhonchi, symmetric air entry Heart - normal rate and regular rhythm Abdomen - soft, nontender, nondistended, no masses or organomegaly Pelvic - examination, cervix normal bimanual nontender mobile uterus. Adnexa negative Extremities - peripheral pulses normal, no pedal edema, no clubbing or cyanosis  Labs: No results found for this or any previous visit (from the past 336 hour(s)).  Imaging Studies: No results found.  Assessment: Patient Active Problem List   Diagnosis Date Noted  . Consultation for female sterilization 01/01/2016  . Contraceptive management 11/30/2015  . Smoker 11/30/2015  . Palpitations 04/27/2014  . Pre-syncope 04/26/2014  . Near syncope 04/26/2014  . Chest pain 04/26/2014  . Hip pain 12/12/2013  . Acute sinusitis 10/15/2013  .  Fibromyalgia 02/10/2013  . Somatization disorder 02/10/2013  . Pseudoseizures 02/10/2013  . DDD (degenerative disc disease), lumbosacral 02/10/2013  . Von Willebrand disease ([place]) 02/10/2013  . SMOKER 05/20/2010  . SORE THROAT 05/20/2010  . ACUTE BRONCHITIS 05/20/2010  . CHRONIC OBSTRUCTIVE PULMONARY DISEASE 05/20/2010  . FOREIGN BODY, EAR, RIGHT 05/20/2010  . SHOULDER PAIN 10/27/2008  . NECK PAIN 10/27/2008    Plan: Patient will undergo surgical management with laparoscopic bilateral salpingectomy, endometrial ablation. Scheduled for next Tuesday 28 Feb 17   Jonnie Kind, MD   01/10/2016 12:07 PM  By signing my name below, I, Theresa Washington, attest that this documentation has been prepared under the direction and in the presence of Theresa Shirk, MD. Electronically Signed: Terressa Washington, ED Scribe. 01/10/2016. 12:07 PM.   I personally performed the services described in this documentation, which was SCRIBED in my presence. The recorded information has been reviewed and considered accurate. It has been edited as necessary during review. Jonnie Kind, MD

## 2016-01-10 NOTE — H&P (Signed)
Jonnie Kind, MD at 01/10/2016 12:01 PM     Status: Signed       Expand All Collapse All   Patient ID: Theresa Washington, female DOB: 1981-01-07, 35 y.o. MRN: [id number] Preoperative History and Physical  Theresa Washington is a 35 y.o. G2P2 here for surgical management of sterilization. No significant preoperative concerns.  Proposed surgery: laparoscopic bilateral salpingectomy, endometrial ablation   Past Medical History  Diagnosis Date  . Von Willebrand disease ([place])   . Seizures ([place])   . Bell's palsy   . Back pain   . Degenerative disc disease   . DDD (degenerative disc disease)   . Spondylosis   . Fibromyalgia   . DDD (degenerative disc disease)   . Ankylosing spondylitis ([place])   . Fibromyalgia   . PVC (premature ventricular contraction)   . Palpitations   . Anemia   . Anxiety   . Kidney stone   . Myositis   . 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 Contraceptive management 11/30/2015  . Smoker 11/30/2015   Past Surgical History  Procedure Laterality Date  . Oophorectomy      Left side   OB History  Gravida Para Term Preterm AB SAB TAB Ectopic Multiple Living  2 2        2     # Outcome Date GA Lbr Len/2nd Weight Sex Delivery Anes PTL Lv  2 Para           1 Para             Patient denies any other pertinent gynecologic issues.   Current Outpatient Prescriptions on File Prior to Visit  Medication Sig Dispense Refill  . Aspirin-Salicylamide-Caffeine (BC FAST PAIN RELIEF ARTHRITIS) [id number] MG PACK Take 1 packet by mouth daily.    . clobetasol ointment (TEMOVATE) [id number] % Apply 1 application topically daily as needed (for rash).     . diclofenac sodium (VOLTAREN) 1 % GEL Apply 1 application topically daily as needed. For pain 100 g 3  . etonogestrel-ethinyl estradiol (NUVARING) 0.12-0.015 MG/24HR vaginal ring  insert 1 ring vaginally for 3 weeks REMOVE for 1 week and repeat 1 each 2  . hydrochlorothiazide (HYDRODIURIL) 25 MG tablet Take 1 tablet (25 mg total) by mouth daily. 90 tablet 3  . methylcellulose (ARTIFICIAL TEARS) 1 % ophthalmic solution Place 1 drop into both eyes daily as needed. Dry Eyes    . nebivolol (BYSTOLIC) 5 MG tablet Take 1 tablet (5 mg total) by mouth daily. 30 tablet 11   No current facility-administered medications on file prior to visit.   Allergies  Allergen Reactions  . Shrimp [Shellfish Allergy] Anaphylaxis, Hives and Swelling  . Other     Pt states "I carry an Epipen. I had anaphylactic reaction to something but I don't know what".     Social History:  reports that she has been smoking Cigarettes. She has a 20 pack-year smoking history. She has never used smokeless tobacco. She reports that she does not drink alcohol or use illicit drugs.  Family History  Problem Relation Age of Onset  . Heart disease Father   . Kidney disease Father   . Alcohol abuse Brother   . Mental retardation Brother   . Schizophrenia Brother   . Colon cancer Maternal Grandmother   . Other Brother     back problems    Review of Systems: Noncontributory  PHYSICAL EXAM: Blood pressure 110/70, pulse 74, height 5\' 3"  (1.6 m), weight 138 lb (  62.596 kg). General appearance - alert, well appearing, and in no distress Chest - clear to auscultation, no wheezes, rales or rhonchi, symmetric air entry Heart - normal rate and regular rhythm Abdomen - soft, nontender, nondistended, no masses or organomegaly Pelvic - examination, cervix normal bimanual nontender mobile uterus. Adnexa negative Extremities - peripheral pulses normal, no pedal edema, no clubbing or cyanosis  Labs: No results found for this or any previous visit (from the past 336 hour(s)).  Imaging Studies:  Imaging Results    No results found.     Assessment: Patient Active Problem List   Diagnosis Date Noted  . Consultation for female sterilization 01/01/2016  . Contraceptive management 11/30/2015  . Smoker 11/30/2015  . Palpitations 04/27/2014  . Pre-syncope 04/26/2014  . Near syncope 04/26/2014  . Chest pain 04/26/2014  . Hip pain 12/12/2013  . Acute sinusitis 10/15/2013  . Fibromyalgia 02/10/2013  . Somatization disorder 02/10/2013  . Pseudoseizures 02/10/2013  . DDD (degenerative disc disease), lumbosacral 02/10/2013  . Von Willebrand disease ([place]) 02/10/2013  . SMOKER 05/20/2010  . SORE THROAT 05/20/2010  . ACUTE BRONCHITIS 05/20/2010  . CHRONIC OBSTRUCTIVE PULMONARY DISEASE 05/20/2010  . FOREIGN BODY, EAR, RIGHT 05/20/2010  . SHOULDER PAIN 10/27/2008  . NECK PAIN 10/27/2008    Plan: Patient will undergo surgical management with laparoscopic bilateral salpingectomy, endometrial ablation. Scheduled for next Tuesday 28 Feb 17   Jonnie Kind, MD  01/10/2016 12:07 PM

## 2016-01-11 NOTE — Patient Instructions (Signed)
Theresa Washington  01/11/2016     @PREFPERIOPPHARMACY @   Your procedure is scheduled on  01/16/2016 .  Report to [facility] at  1025  A.M.  Call this number if you have problems the morning of surgery:  [phone]   Remember:  Do not eat food or drink liquids after midnight.  Take these medicines the morning of surgery with A SIP OF WATER  bystolic.   Do not wear jewelry, make-up or nail polish.  Do not wear lotions, powders, or perfumes.  You may wear deodorant.  Do not shave 48 hours prior to surgery.  Men may shave face and neck.  Do not bring valuables to the hospital.  [facility] is not responsible for any belongings or valuables.  Contacts, dentures or bridgework may not be worn into surgery.  Leave your suitcase in the car.  After surgery it may be brought to your room.  For patients admitted to the hospital, discharge time will be determined by your treatment team.  Patients discharged the day of surgery will not be allowed to drive home.   Name and phone number of your driver:   family Special instructions:  none  Please read over the following fact sheets that you were given. Coughing and Deep Breathing, Surgical Site Infection Prevention, Anesthesia Post-op Instructions and Care and Recovery After Surgery      Salpingectomy Salpingectomy, also called tubectomy, is the surgical removal of one of the fallopian tubes. The fallopian tubes are tubes that are connected to the uterus. These tubes transport the egg from the ovary to the uterus. A salpingectomy may be done for various reasons, including:   A tubal (ectopic) pregnancy. This is especially true if the tube ruptures.  An infected fallopian tube.  The need to remove the fallopian tube when removing an ovary with a cyst or tumor.  The need to remove the fallopian tube when removing the uterus.  Cancer of the fallopian tube or nearby organs. Removing one fallopian tube  does not prevent you from becoming pregnant. It also does not cause problems with your menstrual periods.  LET [facility] CARE PROVIDER KNOW ABOUT:  Any allergies you have.  All medicines you are taking, including vitamins, herbs, eye drops, creams, and over-the-counter medicines.  Previous problems you or members of your family have had with the use of anesthetics.  Any blood disorders you have.  Previous surgeries you have had.  Medical conditions you have. RISKS AND COMPLICATIONS  Generally, this is a safe procedure. However, as with any procedure, complications can occur. Possible complications include:  Injury to surrounding organs.  Bleeding.  Infection.  Problems related to anesthesia. BEFORE THE PROCEDURE  Ask your health care provider about changing or stopping your regular medicines. You may need to stop taking certain medicines, such as aspirin or blood thinners, at least 1 week before the surgery.  Do not eat or drink anything for at least 8 hours before the surgery.  If you smoke, do not smoke for at least 2 weeks before the surgery.  Make plans to have someone drive you home after the procedure or after your hospital stay. Also arrange for someone to help you with activities during recovery. PROCEDURE   You will be given medicine to help you relax before the procedure (sedative). You will then be given medicine to make you sleep through the  procedure (general anesthetic). These medicines will be given through an IV access tube that is put into one of your veins.  Once you are asleep, your lower abdomen will be shaved and cleaned. A thin, flexible tube (catheter) will be placed in your bladder.  The surgeon may use a laparoscopic, robotic, or open technique for this surgery:  In the laparoscopic technique, the surgery is done through two small cuts (incisions) in the abdomen. A thin, lighted tube with a tiny camera on the end (laparoscope) is inserted into one  of the incisions. The tools needed for the procedure are put through the other incision.  A robotic technique may be chosen to perform complex surgery in a small space. In the robotic technique, small incisions will be made. A camera and surgical instruments are passed through the incisions. Surgical instruments will be controlled with the help of a robotic arm.  In the open technique, the surgery is done through one large incision in the abdomen.  Using any of these techniques, the surgeon removes the fallopian tube from where it attaches to the uterus. The blood vessels will be clamped and tied.  The surgeon then uses staples or stitches to close the incision or incisions. AFTER THE PROCEDURE   You will be taken to a recovery area where your progress will be monitored for 1-3 hours.  If the laparoscopic technique was used, you may be allowed to go home after several hours. You may have some shoulder pain after the laparoscopic procedure. This is normal and usually goes away in a day or two.  If the open technique was used, you will be admitted to the hospital for a couple of days.  You will be given pain medicine if needed.  The IV access tube and catheter will be removed before you are discharged.   This information is not intended to replace advice given to you by your health care provider. Make sure you discuss any questions you have with your health care provider.   Document Released: 03/23/2009 Document Revised: 11/25/2014 Document Reviewed: 04/28/2013 Elsevier Interactive Patient Education 2016 [place] After Refer to this sheet in the next few weeks. These instructions provide you with information on caring for yourself after your procedure. Your health care provider may also give you more specific instructions. Your treatment has been planned according to current medical practices, but problems sometimes occur. Call your health care provider if you have  any problems or questions after your procedure. WHAT TO EXPECT AFTER THE PROCEDURE After your procedure, it is typical to have the following:  Abdominal pain that can be controlled with pain medicine.  Vaginal spotting.  Tiredness. HOME CARE INSTRUCTIONS  Get plenty of rest and sleep.  Only take over-the-counter or prescription medicines as directed by your health care provider.  Keep incision areas clean and dry. Remove or change any bandages (dressings) only as directed by your health care provider.  You may resume your regular diet. Eat a well-balanced diet.  Drink enough fluids to keep your urine clear or pale yellow.  Limit exercise and activities as directed by your health care provider. Do not lift anything heavier than 5 lb (2.3 kg) until your health care provider approves.  Do not drive until your health care provider approves.  Do not have sexual intercourse until your health care provider says it is okay.  Take your temperature twice a day for the first week. Write those temperatures down.  Follow up with  your health care provider as directed. SEEK MEDICAL CARE IF:  You have pain when you urinate.  You see pus coming out of the incision, or the incision is separating.  You have increasing abdominal pain.  You have swelling or redness in the incision area.  You develop a rash.  You feel lightheaded.  You have pain that is not controlled with medicine. SEEK IMMEDIATE MEDICAL CARE IF:  You develop a fever.  You have increasing abdominal pain.  You develop chest or leg pain.  You develop shortness of breath.  You pass out.   This information is not intended to replace advice given to you by your health care provider. Make sure you discuss any questions you have with your health care provider.   Document Released: 02/08/2011 Document Revised: 11/25/2014 Document Reviewed: 04/28/2013 Elsevier Interactive Patient Education 2016 [place]. Endometrial Ablation Endometrial ablation removes the lining of the uterus (endometrium). It is usually a same-day, outpatient treatment. Ablation helps avoid major surgery, such as surgery to remove the cervix and uterus (hysterectomy). After endometrial ablation, you will have little or no menstrual bleeding and may not be able to have children. However, if you are premenopausal, you will need to use a reliable method of birth control following the procedure because of the small chance that pregnancy can occur. There are different reasons to have this procedure. These reasons include:  Heavy periods.  Bleeding that is causing anemia.  Irregular bleeding.  Bleeding fibroids on the lining inside the uterus if they are smaller than 3 centimeters. This procedure may not be possible for you if:   You want to have children in the future.   You have severe cramps with your menstrual period.   You have precancerous or cancerous cells in your uterus.   You were recently pregnant.   You have gone through menopause.   You have had major surgery on your uterus, resulting in thinning of the uterine wall. Surgeries may include:  The removal of one or more uterine fibroids (myomectomy).  A cesarean section with a classic (vertical) incision on your uterus. Ask your health care provider what type of cesarean you had. Sometimes the scar on your skin is different than the scar on your uterus. Even if you have had surgery on your uterus, certain types of ablation may still be safe for you. Talk with your health care provider. LET [facility] CARE PROVIDER KNOW ABOUT:  Any allergies you have.  All medicines you are taking, including vitamins, herbs, eye drops, creams, and over-the-counter medicines.  Previous problems you or members of your family have had with the use of anesthetics.  Any blood disorders you have.  Previous surgeries you have had.  Medical conditions you  have. RISKS AND COMPLICATIONS  Generally, this is a safe procedure. However, as with any procedure, complications can occur. Possible complications include:  Perforation of the uterus.  Bleeding.  Infection of the uterus, bladder, or vagina.  Injury to surrounding organs.  An air bubble to the lung (air embolus).  Pregnancy following the procedure.  Failure of the procedure to help the problem, requiring hysterectomy.  Decreased ability to diagnose cancer in the lining of the uterus. BEFORE THE PROCEDURE  The lining of the uterus must be tested to make sure there is no pre-cancerous or cancer cells present.  An ultrasound may be performed to look at the size of the uterus and to check for abnormalities.  Medicines may be given to  thin the lining of the uterus. PROCEDURE  During the procedure, your health care provider will use a tool called a resectoscope to help see inside your uterus. There are different ways to remove the lining of your uterus.   Radiofrequency - This method uses a radiofrequency-alternating electric current to remove the lining of the uterus.  Cryotherapy - This method uses extreme cold to freeze the lining of the uterus.  Heated-Free Liquid - This method uses heated salt (saline) solution to remove the lining of the uterus.  Microwave - This method uses high-energy microwaves to heat up the lining of the uterus to remove it.  Thermal balloon - This method involves inserting a catheter with a balloon tip into the uterus. The balloon tip is filled with heated fluid to remove the lining of the uterus. AFTER THE PROCEDURE  After your procedure, do not have sexual intercourse or insert anything into your vagina until permitted by your health care provider. After the procedure, you may experience:  Cramps.  Vaginal discharge.  Frequent urination.   This information is not intended to replace advice given to you by your health care provider. Make sure you  discuss any questions you have with your health care provider.   Document Released: 09/13/2004 Document Revised: 07/26/2015 Document Reviewed: 04/07/2013 Elsevier Interactive Patient Education [employer]. Hysteroscopy Hysteroscopy is a procedure used for looking inside the womb (uterus). It may be done for various reasons, including:  To evaluate abnormal bleeding, fibroid (benign, noncancerous) tumors, polyps, scar tissue (adhesions), and possibly cancer of the uterus.  To look for lumps (tumors) and other uterine growths.  To look for causes of why a woman cannot get pregnant (infertility), causes of recurrent loss of pregnancy (miscarriages), or a lost intrauterine device (IUD).  To perform a sterilization by blocking the fallopian tubes from inside the uterus. In this procedure, a thin, flexible tube with a tiny light and camera on the end of it (hysteroscope) is used to look inside the uterus. A hysteroscopy should be done right after a menstrual period to be sure you are not pregnant. LET [facility] CARE PROVIDER KNOW ABOUT:   Any allergies you have.  All medicines you are taking, including vitamins, herbs, eye drops, creams, and over-the-counter medicines.  Previous problems you or members of your family have had with the use of anesthetics.  Any blood disorders you have.  Previous surgeries you have had.  Medical conditions you have. RISKS AND COMPLICATIONS  Generally, this is a safe procedure. However, as with any procedure, complications can occur. Possible complications include:  Putting a hole in the uterus.  Excessive bleeding.  Infection.  Damage to the cervix.  Injury to other organs.  Allergic reaction to medicines.  Too much fluid used in the uterus for the procedure. BEFORE THE PROCEDURE   Ask your health care provider about changing or stopping any regular medicines.  Do not take aspirin or blood thinners for 1 week before the procedure, or  as directed by your health care provider. These can cause bleeding.  If you smoke, do not smoke for 2 weeks before the procedure.  In some cases, a medicine is placed in the cervix the day before the procedure. This medicine makes the cervix have a larger opening (dilate). This makes it easier for the instrument to be inserted into the uterus during the procedure.  Do not eat or drink anything for at least 8 hours before the surgery.  Arrange for someone  to take you home after the procedure. PROCEDURE   You may be given a medicine to relax you (sedative). You may also be given one of the following:  A medicine that numbs the area around the cervix (local anesthetic).  A medicine that makes you sleep through the procedure (general anesthetic).  The hysteroscope is inserted through the vagina into the uterus. The camera on the hysteroscope sends a picture to a TV screen. This gives the surgeon a good view inside the uterus.  During the procedure, air or a liquid is put into the uterus, which allows the surgeon to see better.  Sometimes, tissue is gently scraped from inside the uterus. These tissue samples are sent to a lab for testing. AFTER THE PROCEDURE   If you had a general anesthetic, you may be groggy for a couple hours after the procedure.  If you had a local anesthetic, you will be able to go home as soon as you are stable and feel ready.  You may have some cramping. This normally lasts for a couple days.  You may have bleeding, which varies from light spotting for a few days to menstrual-like bleeding for 3-7 days. This is normal.  If your test results are not back during the visit, make an appointment with your health care provider to find out the results.   This information is not intended to replace advice given to you by your health care provider. Make sure you discuss any questions you have with your health care provider.   Document Released: 02/10/2001 Document  Revised: 08/25/2013 Document Reviewed: 06/03/2013 Elsevier Interactive Patient Education 2016 [place]. Hysteroscopy, Care After Refer to this sheet in the next few weeks. These instructions provide you with information on caring for yourself after your procedure. Your health care provider may also give you more specific instructions. Your treatment has been planned according to current medical practices, but problems sometimes occur. Call your health care provider if you have any problems or questions after your procedure.  WHAT TO EXPECT AFTER THE PROCEDURE After your procedure, it is typical to have the following:  You may have some cramping. This normally lasts for a couple days.  You may have bleeding. This can vary from light spotting for a few days to menstrual-like bleeding for 3-7 days. HOME CARE INSTRUCTIONS  Rest for the first 1-2 days after the procedure.  Only take over-the-counter or prescription medicines as directed by your health care provider. Do not take aspirin. It can increase the chances of bleeding.  Take showers instead of baths for 2 weeks or as directed by your health care provider.  Do not drive for 24 hours or as directed.  Do not drink alcohol while taking pain medicine.  Do not use tampons, douche, or have sexual intercourse for 2 weeks or until your health care provider says it is okay.  Take your temperature twice a day for 4-5 days. Write it down each time.  Follow your health care provider's advice about diet, exercise, and lifting.  If you develop constipation, you may:  Take a mild laxative if your health care provider approves.  Add bran foods to your diet.  Drink enough fluids to keep your urine clear or pale yellow.  Try to have someone with you or available to you for the first 24-48 hours, especially if you were given a general anesthetic.  Follow up with your health care provider as directed. SEEK MEDICAL CARE IF:  You feel dizzy  or lightheaded.  You feel sick to your stomach (nauseous).  You have abnormal vaginal discharge.  You have a rash.  You have pain that is not controlled with medicine. SEEK IMMEDIATE MEDICAL CARE IF:  You have bleeding that is heavier than a normal menstrual period.  You have a fever.  You have increasing cramps or pain, not controlled with medicine.  You have new belly (abdominal) pain.  You pass out.  You have pain in the tops of your shoulders (shoulder strap areas).  You have shortness of breath.   This information is not intended to replace advice given to you by your health care provider. Make sure you discuss any questions you have with your health care provider.   Document Released: 08/25/2013 Document Reviewed: 08/25/2013 Elsevier Interactive Patient Education 2016 [employer] Inc. Dilation and Curettage or Vacuum Curettage Dilation and curettage (D&C) and vacuum curettage are minor procedures. A D&C involves stretching (dilation) the cervix and scraping (curettage) the inside lining of the womb (uterus). During a D&C, tissue is gently scraped from the inside lining of the uterus. During a vacuum curettage, the lining and tissue in the uterus are removed with the use of gentle suction.  Curettage may be performed to either diagnose or treat a problem. As a diagnostic procedure, curettage is performed to examine tissues from the uterus. A diagnostic curettage may be performed for the following symptoms:   Irregular bleeding in the uterus.   Bleeding with the development of clots.   Spotting between menstrual periods.   Prolonged menstrual periods.   Bleeding after menopause.   No menstrual period (amenorrhea).   A change in size and shape of the uterus.  As a treatment procedure, curettage may be performed for the following reasons:   Removal of an IUD (intrauterine device).   Removal of retained placenta after giving birth. Retained placenta can cause  an infection or bleeding severe enough to require transfusions.   Abortion.   Miscarriage.   Removal of polyps inside the uterus.   Removal of uncommon types of noncancerous lumps (fibroids).  LET [facility] CARE PROVIDER KNOW ABOUT:   Any allergies you have.   All medicines you are taking, including vitamins, herbs, eye drops, creams, and over-the-counter medicines.   Previous problems you or members of your family have had with the use of anesthetics.   Any blood disorders you have.   Previous surgeries you have had.   Medical conditions you have. RISKS AND COMPLICATIONS  Generally, this is a safe procedure. However, as with any procedure, complications can occur. Possible complications include:  Excessive bleeding.   Infection of the uterus.   Damage to the cervix.   Development of scar tissue (adhesions) inside the uterus, later causing abnormal amounts of menstrual bleeding.   Complications from the general anesthetic, if a general anesthetic is used.   Putting a hole (perforation) in the uterus. This is rare.  BEFORE THE PROCEDURE   Eat and drink before the procedure only as directed by your health care provider.   Arrange for someone to take you home.  PROCEDURE  This procedure usually takes about 15-30 minutes.  You will be given one of the following:  A medicine that numbs the area in and around the cervix (local anesthetic).   A medicine to make you sleep through the procedure (general anesthetic).  You will lie on your back with your legs in stirrups.   A warm metal or plastic instrument (speculum) will be  placed in your vagina to keep it open and to allow the health care provider to see the cervix.  There are two ways in which your cervix can be softened and dilated. These include:   Taking a medicine.   Having thin rods (laminaria) inserted into your cervix.   A curved tool (curette) will be used to scrape cells from the  inside lining of the uterus. In some cases, gentle suction is applied with the curette. The curette will then be removed.  AFTER THE PROCEDURE   You will rest in the recovery area until you are stable and are ready to go home.   You may feel sick to your stomach (nauseous) or throw up (vomit) if you were given a general anesthetic.   You may have a sore throat if a tube was placed in your throat during general anesthesia.   You may have light cramping and bleeding. This may last for 2 days to 2 weeks after the procedure.   Your uterus needs to make a new lining after the procedure. This may make your next period late.   This information is not intended to replace advice given to you by your health care provider. Make sure you discuss any questions you have with your health care provider.   Document Released: 11/04/2005 Document Revised: 07/07/2013 Document Reviewed: 06/03/2013 Elsevier Interactive Patient Education 2016 Elsevier Inc. Dilation and Curettage or Vacuum Curettage, Care After Refer to this sheet in the next few weeks. These instructions provide you with information on caring for yourself after your procedure. Your health care provider may also give you more specific instructions. Your treatment has been planned according to current medical practices, but problems sometimes occur. Call your health care provider if you have any problems or questions after your procedure. WHAT TO EXPECT AFTER THE PROCEDURE After your procedure, it is typical to have light cramping and bleeding. This may last for 2 days to 2 weeks after the procedure. HOME CARE INSTRUCTIONS   Do not drive for 24 hours.  Wait 1 week before returning to strenuous activities.  Take your temperature 2 times a day for 4 days and write it down. Provide these temperatures to your health care provider if you develop a fever.  Avoid long periods of standing.  Avoid heavy lifting, pushing, or pulling. Do not lift  anything heavier than 10 pounds (4.5 kg).  Limit stair climbing to once or twice a day.  Take rest periods often.  You may resume your usual diet.  Drink enough fluids to keep your urine clear or pale yellow.  Your usual bowel function should return. If you have constipation, you may:  Take a mild laxative with permission from your health care provider.  Add fruit and bran to your diet.  Drink more fluids.  Take showers instead of baths until your health care provider gives you permission to take baths.  Do not go swimming or use a hot tub until your health care provider approves.  Try to have someone with you or available to you the first 24-48 hours, especially if you were given a general anesthetic.  Do not douche, use tampons, or have sex (intercourse) for 2 weeks after the procedure.  Only take over-the-counter or prescription medicines as directed by your health care provider. Do not take aspirin. It can cause bleeding.  Follow up with your health care provider as directed. SEEK MEDICAL CARE IF:   You have increasing cramps or pain that is  not relieved with medicine.  You have abdominal pain that does not seem to be related to the same area of earlier cramping and pain.  You have bad smelling vaginal discharge.  You have a rash.  You are having problems with any medicine. SEEK IMMEDIATE MEDICAL CARE IF:   You have bleeding that is heavier than a normal menstrual period.  You have a fever.  You have chest pain.  You have shortness of breath.  You feel dizzy or feel like fainting.  You pass out.  You have pain in your shoulder strap area.  You have heavy vaginal bleeding with or without blood clots. MAKE SURE YOU:   Understand these instructions.  Will watch your condition.  Will get help right away if you are not doing well or get worse.   This information is not intended to replace advice given to you by your health care provider. Make sure you  discuss any questions you have with your health care provider.   Document Released: 11/01/2000 Document Revised: 11/09/2013 Document Reviewed: 06/03/2013 Elsevier Interactive Patient Education 2016 Elsevier Inc. PATIENT INSTRUCTIONS POST-ANESTHESIA  IMMEDIATELY FOLLOWING SURGERY:  Do not drive or operate machinery for the first twenty four hours after surgery.  Do not make any important decisions for twenty four hours after surgery or while taking narcotic pain medications or sedatives.  If you develop intractable nausea and vomiting or a severe headache please notify your doctor immediately.  FOLLOW-UP:  Please make an appointment with your surgeon as instructed. You do not need to follow up with anesthesia unless specifically instructed to do so.  WOUND CARE INSTRUCTIONS (if applicable):  Keep a dry clean dressing on the anesthesia/puncture wound site if there is drainage.  Once the wound has quit draining you may leave it open to air.  Generally you should leave the bandage intact for twenty four hours unless there is drainage.  If the epidural site drains for more than 36-48 hours please call the anesthesia department.  QUESTIONS?:  Please feel free to call your physician or the hospital operator if you have any questions, and they will be happy to assist you.

## 2016-01-12 ENCOUNTER — Encounter (HOSPITAL_COMMUNITY): Payer: Self-pay

## 2016-01-12 ENCOUNTER — Encounter (HOSPITAL_COMMUNITY)
Admission: RE | Admit: 2016-01-12 | Discharge: 2016-01-12 | Disposition: A | Discharge status: Home / Self Care | Payer: Medicare Other | Source: Ambulatory Visit | Attending: Obstetrics and Gynecology | Admitting: Obstetrics and Gynecology

## 2016-01-12 ENCOUNTER — Other Ambulatory Visit: Payer: Self-pay | Admitting: Obstetrics and Gynecology

## 2016-01-12 DIAGNOSIS — Z01810 Encounter for preprocedural cardiovascular examination: Secondary | ICD-10-CM | POA: Insufficient documentation

## 2016-01-12 DIAGNOSIS — Z302 Encounter for sterilization: Secondary | ICD-10-CM | POA: Diagnosis not present

## 2016-01-12 DIAGNOSIS — Z01812 Encounter for preprocedural laboratory examination: Secondary | ICD-10-CM | POA: Insufficient documentation

## 2016-01-12 HISTORY — DX: Personal history of urinary calculi: Z87.442

## 2016-01-12 HISTORY — DX: Essential (primary) hypertension: I10

## 2016-01-12 LAB — PROTIME-INR
INR: 0.91 (ref 0.00–1.49)
Prothrombin Time: 12.5 seconds (ref 11.6–15.2)

## 2016-01-12 LAB — URINALYSIS, ROUTINE W REFLEX MICROSCOPIC
BILIRUBIN URINE: NEGATIVE
GLUCOSE, UA: NEGATIVE mg/dL
HGB URINE DIPSTICK: NEGATIVE
NITRITE: NEGATIVE
PROTEIN: NEGATIVE mg/dL
Specific Gravity, Urine: 1.025 (ref 1.005–1.030)
pH: 5 (ref 5.0–8.0)

## 2016-01-12 LAB — CBC
HCT: 42 % (ref 36.0–46.0)
Hemoglobin: 14 g/dL (ref 12.0–15.0)
MCH: 30.2 pg (ref 26.0–34.0)
MCHC: 33.3 g/dL (ref 30.0–36.0)
MCV: 90.7 fL (ref 78.0–100.0)
PLATELETS: 392 10*3/uL (ref 150–400)
RBC: 4.63 MIL/uL (ref 3.87–5.11)
RDW: 13 % (ref 11.5–15.5)
WBC: 11.7 10*3/uL — AB (ref 4.0–10.5)

## 2016-01-12 LAB — BASIC METABOLIC PANEL
Anion gap: 10 (ref 5–15)
BUN: 12 mg/dL (ref 6–20)
CO2: 23 mmol/L (ref 22–32)
CREATININE: 0.74 mg/dL (ref 0.44–1.00)
Calcium: 9 mg/dL (ref 8.9–10.3)
Chloride: 107 mmol/L (ref 101–111)
Glucose, Bld: 82 mg/dL (ref 65–99)
Potassium: 4.4 mmol/L (ref 3.5–5.1)
SODIUM: 140 mmol/L (ref 135–145)

## 2016-01-12 LAB — URINE MICROSCOPIC-ADD ON

## 2016-01-12 LAB — HCG, SERUM, QUALITATIVE: PREG SERUM: NEGATIVE

## 2016-01-12 LAB — PLATELET FUNCTION ASSAY: Collagen / Epinephrine: 171 seconds (ref 0–193)

## 2016-01-16 ENCOUNTER — Ambulatory Visit (HOSPITAL_COMMUNITY): Payer: Medicare Other | Admitting: Anesthesiology

## 2016-01-16 ENCOUNTER — Telehealth: Payer: Self-pay | Admitting: Obstetrics and Gynecology

## 2016-01-16 ENCOUNTER — Encounter (HOSPITAL_COMMUNITY): Payer: Self-pay | Admitting: *Deleted

## 2016-01-16 ENCOUNTER — Encounter (HOSPITAL_COMMUNITY)
Admission: RE | Disposition: A | Discharge status: Home / Self Care | Payer: Self-pay | Source: Ambulatory Visit | Attending: Obstetrics and Gynecology

## 2016-01-16 ENCOUNTER — Ambulatory Visit (HOSPITAL_COMMUNITY)
Admission: RE | Admit: 2016-01-16 | Discharge: 2016-01-16 | Disposition: A | Discharge status: Home / Self Care | Payer: Medicare Other | Source: Ambulatory Visit | Attending: Obstetrics and Gynecology | Admitting: Obstetrics and Gynecology

## 2016-01-16 DIAGNOSIS — N926 Irregular menstruation, unspecified: Secondary | ICD-10-CM | POA: Insufficient documentation

## 2016-01-16 DIAGNOSIS — Z791 Long term (current) use of non-steroidal anti-inflammatories (NSAID): Secondary | ICD-10-CM | POA: Diagnosis not present

## 2016-01-16 DIAGNOSIS — F419 Anxiety disorder, unspecified: Secondary | ICD-10-CM | POA: Insufficient documentation

## 2016-01-16 DIAGNOSIS — N920 Excessive and frequent menstruation with regular cycle: Secondary | ICD-10-CM | POA: Diagnosis not present

## 2016-01-16 DIAGNOSIS — Z302 Encounter for sterilization: Secondary | ICD-10-CM | POA: Diagnosis not present

## 2016-01-16 DIAGNOSIS — F17200 Nicotine dependence, unspecified, uncomplicated: Secondary | ICD-10-CM | POA: Insufficient documentation

## 2016-01-16 DIAGNOSIS — Z7689 Persons encountering health services in other specified circumstances: Secondary | ICD-10-CM

## 2016-01-16 HISTORY — PX: LAPAROSCOPIC BILATERAL SALPINGECTOMY: SHX5889

## 2016-01-16 HISTORY — PX: DILITATION & CURRETTAGE/HYSTROSCOPY WITH NOVASURE ABLATION: SHX5568

## 2016-01-16 LAB — VON WILLEBRAND PANEL
Coagulation Factor VIII: 144 % (ref 57–163)
RISTOCETIN CO-FACTOR, PLASMA: 121 % (ref 50–200)
VON WILLEBRAND ANTIGEN, PLASMA: 184 % (ref 50–200)

## 2016-01-16 LAB — COAG STUDIES INTERP REPORT: PDF Image: 0

## 2016-01-16 SURGERY — DILATATION & CURETTAGE/HYSTEROSCOPY WITH NOVASURE ABLATION
Anesthesia: General

## 2016-01-16 MED ORDER — FENTANYL CITRATE (PF) 250 MCG/5ML IJ SOLN
INTRAMUSCULAR | Status: AC
  Filled 2016-01-16: qty 5

## 2016-01-16 MED ORDER — LIDOCAINE HCL (PF) 1 % IJ SOLN
INTRAMUSCULAR | Status: AC
  Filled 2016-01-16: qty 5

## 2016-01-16 MED ORDER — NEOSTIGMINE METHYLSULFATE 10 MG/10ML IV SOLN
INTRAVENOUS | Status: DC | PRN
  Administered 2016-01-16: 3 mg via INTRAVENOUS

## 2016-01-16 MED ORDER — MIDAZOLAM HCL 2 MG/2ML IJ SOLN
1.0000 mg | INTRAMUSCULAR | Status: DC | PRN
  Administered 2016-01-16 (×2): 2 mg via INTRAVENOUS
  Filled 2016-01-16: qty 2

## 2016-01-16 MED ORDER — FENTANYL CITRATE (PF) 100 MCG/2ML IJ SOLN
INTRAMUSCULAR | Status: DC | PRN
  Administered 2016-01-16: 50 ug via INTRAVENOUS
  Administered 2016-01-16: 100 ug via INTRAVENOUS
  Administered 2016-01-16 (×2): 50 ug via INTRAVENOUS

## 2016-01-16 MED ORDER — PROPOFOL 10 MG/ML IV BOLUS
INTRAVENOUS | Status: AC
  Filled 2016-01-16: qty 20

## 2016-01-16 MED ORDER — LACTATED RINGERS IV SOLN
INTRAVENOUS | Status: DC
  Administered 2016-01-16: 1000 mL via INTRAVENOUS

## 2016-01-16 MED ORDER — ONDANSETRON HCL 4 MG/2ML IJ SOLN: 4.0000 mg | Freq: Once | INTRAMUSCULAR | Status: DC | PRN

## 2016-01-16 MED ORDER — SODIUM CHLORIDE 0.9 % IR SOLN
Status: DC | PRN
  Administered 2016-01-16: 3000 mL

## 2016-01-16 MED ORDER — BUPIVACAINE-EPINEPHRINE (PF) 0.5% -1:200000 IJ SOLN
INTRAMUSCULAR | Status: AC
  Filled 2016-01-16: qty 30

## 2016-01-16 MED ORDER — FENTANYL CITRATE (PF) 100 MCG/2ML IJ SOLN: 25.0000 ug | INTRAMUSCULAR | Status: DC | PRN

## 2016-01-16 MED ORDER — SUCCINYLCHOLINE CHLORIDE 20 MG/ML IJ SOLN
INTRAMUSCULAR | Status: AC
  Filled 2016-01-16: qty 1

## 2016-01-16 MED ORDER — LIDOCAINE HCL 1 % IJ SOLN
INTRAMUSCULAR | Status: DC | PRN
Start: 2016-01-16 — End: 2016-01-16
  Administered 2016-01-16: 50 mg via INTRADERMAL

## 2016-01-16 MED ORDER — PROPOFOL 10 MG/ML IV BOLUS
INTRAVENOUS | Status: DC | PRN
  Administered 2016-01-16: 40 mg via INTRAVENOUS
  Administered 2016-01-16: 30 mg via INTRAVENOUS
  Administered 2016-01-16: 130 mg via INTRAVENOUS

## 2016-01-16 MED ORDER — ROCURONIUM BROMIDE 50 MG/5ML IV SOLN
INTRAVENOUS | Status: AC
  Filled 2016-01-16: qty 1

## 2016-01-16 MED ORDER — NEOSTIGMINE METHYLSULFATE 10 MG/10ML IV SOLN
INTRAVENOUS | Status: AC
  Filled 2016-01-16: qty 1

## 2016-01-16 MED ORDER — BUPIVACAINE-EPINEPHRINE 0.5% -1:200000 IJ SOLN
INTRAMUSCULAR | Status: DC | PRN
  Administered 2016-01-16: 20 mL

## 2016-01-16 MED ORDER — BUPIVACAINE HCL (PF) 0.5 % IJ SOLN
INTRAMUSCULAR | Status: AC
  Filled 2016-01-16: qty 30

## 2016-01-16 MED ORDER — OXYCODONE-ACETAMINOPHEN 5-325 MG PO TABS: 1.0000 | ORAL_TABLET | ORAL | Status: DC | PRN

## 2016-01-16 MED ORDER — ONDANSETRON HCL 4 MG/2ML IJ SOLN
INTRAMUSCULAR | Status: AC
  Filled 2016-01-16: qty 2

## 2016-01-16 MED ORDER — ROCURONIUM BROMIDE 100 MG/10ML IV SOLN
INTRAVENOUS | Status: DC | PRN
  Administered 2016-01-16: 5 mg via INTRAVENOUS
  Administered 2016-01-16: 25 mg via INTRAVENOUS

## 2016-01-16 MED ORDER — ONDANSETRON HCL 4 MG/2ML IJ SOLN
4.0000 mg | Freq: Once | INTRAMUSCULAR | Status: AC
  Administered 2016-01-16: 4 mg via INTRAVENOUS

## 2016-01-16 MED ORDER — MIDAZOLAM HCL 2 MG/2ML IJ SOLN
INTRAMUSCULAR | Status: AC
  Filled 2016-01-16: qty 2

## 2016-01-16 MED ORDER — GLYCOPYRROLATE 0.2 MG/ML IJ SOLN
INTRAMUSCULAR | Status: DC | PRN
  Administered 2016-01-16: 0.6 mg via INTRAVENOUS

## 2016-01-16 MED ORDER — MIDAZOLAM HCL 5 MG/5ML IJ SOLN
INTRAMUSCULAR | Status: DC | PRN
  Administered 2016-01-16: 2 mg via INTRAVENOUS

## 2016-01-16 MED ORDER — GLYCOPYRROLATE 0.2 MG/ML IJ SOLN
INTRAMUSCULAR | Status: AC
  Filled 2016-01-16: qty 3

## 2016-01-16 MED ORDER — 0.9 % SODIUM CHLORIDE (POUR BTL) OPTIME
TOPICAL | Status: DC | PRN
  Administered 2016-01-16: 1000 mL

## 2016-01-16 SURGICAL SUPPLY — 50 items
ABLATOR ENDOMETRIAL BIPOLAR (ABLATOR) ×4 IMPLANT
BAG HAMPER (MISCELLANEOUS) ×4 IMPLANT
BANDAGE STRIP 1X3 FLEXIBLE (GAUZE/BANDAGES/DRESSINGS) ×12 IMPLANT
BLADE SURG SZ11 CARB STEEL (BLADE) ×4 IMPLANT
CATH ROBINSON RED A/P 16FR (CATHETERS) ×2 IMPLANT
CHLORAPREP W/TINT 26ML (MISCELLANEOUS) ×2 IMPLANT
CLOSURE STERI-STRIP 1/4X4 (GAUZE/BANDAGES/DRESSINGS) ×4 IMPLANT
CLOSURE WOUND 1/4 X3 (GAUZE/BANDAGES/DRESSINGS) ×1
CLOTH BEACON ORANGE TIMEOUT ST (SAFETY) ×4 IMPLANT
COVER LIGHT HANDLE STERIS (MISCELLANEOUS) ×8 IMPLANT
DECANTER SPIKE VIAL GLASS SM (MISCELLANEOUS) ×4 IMPLANT
ELECT REM PT RETURN 9FT ADLT (ELECTROSURGICAL) ×4
ELECTRODE REM PT RTRN 9FT ADLT (ELECTROSURGICAL) ×2 IMPLANT
FORMALIN 10 PREFIL 120ML (MISCELLANEOUS) ×4 IMPLANT
GLOVE BIOGEL PI IND STRL 7.0 (GLOVE) ×2 IMPLANT
GLOVE BIOGEL PI IND STRL 7.5 (GLOVE) IMPLANT
GLOVE BIOGEL PI IND STRL 9 (GLOVE) ×2 IMPLANT
GLOVE BIOGEL PI INDICATOR 7.0 (GLOVE) ×8
GLOVE BIOGEL PI INDICATOR 7.5 (GLOVE) ×2
GLOVE BIOGEL PI INDICATOR 9 (GLOVE) ×2
GLOVE ECLIPSE 6.5 STRL STRAW (GLOVE) ×2 IMPLANT
GLOVE ECLIPSE 7.0 STRL STRAW (GLOVE) ×2 IMPLANT
GLOVE ECLIPSE 9.0 STRL (GLOVE) ×8 IMPLANT
GOWN SPEC L3 XXLG W/TWL (GOWN DISPOSABLE) ×4 IMPLANT
GOWN STRL REUS W/TWL LRG LVL3 (GOWN DISPOSABLE) ×4 IMPLANT
INST SET HYSTEROSCOPY (KITS) ×4 IMPLANT
INST SET LAPROSCOPIC GYN AP (KITS) ×4 IMPLANT
IV NS IRRIG 3000ML ARTHROMATIC (IV SOLUTION) ×4 IMPLANT
KIT ROOM TURNOVER AP CYSTO (KITS) ×4 IMPLANT
KIT ROOM TURNOVER APOR (KITS) ×4 IMPLANT
MANIFOLD NEPTUNE II (INSTRUMENTS) ×4 IMPLANT
NEEDLE INSUFFLATION 120MM (ENDOMECHANICALS) ×4 IMPLANT
NS IRRIG 1000ML POUR BTL (IV SOLUTION) ×4 IMPLANT
PACK PERI GYN (CUSTOM PROCEDURE TRAY) ×4 IMPLANT
PAD ARMBOARD 7.5X6 YLW CONV (MISCELLANEOUS) ×4 IMPLANT
PAD TELFA 3X4 1S STER (GAUZE/BANDAGES/DRESSINGS) ×4 IMPLANT
SET BASIN LINEN APH (SET/KITS/TRAYS/PACK) ×4 IMPLANT
SET IRRIG TUBING LAPAROSCOPIC (IRRIGATION / IRRIGATOR) IMPLANT
SET IRRIG Y TYPE TUR BLADDER L (SET/KITS/TRAYS/PACK) ×4 IMPLANT
SHEARS HARMONIC ACE PLUS 36CM (ENDOMECHANICALS) ×4 IMPLANT
SLEEVE ENDOPATH XCEL 5M (ENDOMECHANICALS) ×8 IMPLANT
SOLUTION ANTI FOG 6CC (MISCELLANEOUS) ×4 IMPLANT
STRIP CLOSURE SKIN 1/4X3 (GAUZE/BANDAGES/DRESSINGS) ×1 IMPLANT
SUT VIC AB 4-0 PS2 27 (SUTURE) ×4 IMPLANT
SYR BULB IRRIGATION 50ML (SYRINGE) ×4 IMPLANT
SYR CONTROL 10ML LL (SYRINGE) ×4 IMPLANT
SYRINGE 10CC LL (SYRINGE) ×4 IMPLANT
TROCAR XCEL NON-BLD 5MMX100MML (ENDOMECHANICALS) ×4 IMPLANT
TUBING INSUFFLATION (TUBING) ×4 IMPLANT
WARMER LAPAROSCOPE (MISCELLANEOUS) ×4 IMPLANT

## 2016-01-16 NOTE — Transfer of Care (Signed)
Immediate Anesthesia Transfer of Care Note  [name]  Procedure(s) Performed: Procedure(s) with comments: HYSTEROSCOPY WITH NOVASURE ABLATION (N/A) - Uterine Cavity Length 4.0 cm Uterine Cavity Width 3.3cm Power 73  Time 1 minute 17seconds LAPAROSCOPIC BILATERAL SALPINGECTOMY (Bilateral) - procedure 1  Patient Location: PACU  Anesthesia Type:General  Level of Consciousness: awake and alert   Airway & Oxygen Therapy: Patient Spontanous Breathing  Post-op Assessment: Report given to RN, Post -op Vital signs reviewed and stable and Patient moving all extremities  Post vital signs: Reviewed and stable  Complications: No apparent anesthesia complications

## 2016-01-16 NOTE — Brief Op Note (Signed)
01/16/2016  1:04 PM  PATIENT:  Theresa Washington  35 y.o. female  PRE-OPERATIVE DIAGNOSIS:  Permanent Sterilization; Menstrual Regulation  POST-OPERATIVE DIAGNOSIS:  Permanent Sterilization; Menstrual Regulation  PROCEDURE:  Procedure(s) with comments: HYSTEROSCOPY WITH NOVASURE ABLATION (N/A) - Uterine Cavity Length 4.0 cm Uterine Cavity Width 3.3cm Power 73  Time 1 minute 17seconds LAPAROSCOPIC BILATERAL SALPINGECTOMY (Bilateral) - procedure 1  SURGEON:  Surgeon(s) and Role:    * Jonnie Kind, MD - Primary  PHYSICIAN ASSISTANT:   ASSISTANTS: Zoila Shutter CST   ANESTHESIA:   local and general  EBL:  Total I/O In: 400 [I.V.:400] Out: 10 [Blood:10]  BLOOD ADMINISTERED:none  DRAINS: none   LOCAL MEDICATIONS USED:  MARCAINE  with epinephrine 1-200,000  and Amount: 20 cc ml paracervical block  SPECIMEN:  Source of Specimen:  Bilateral fallopian tubes  DISPOSITION OF SPECIMEN:  PATHOLOGY  COUNTS:  YES  TOURNIQUET:  * No tourniquets in log *  DICTATION: .Dragon Dictation  PLAN OF CARE: Discharge to home after PACU  PATIENT DISPOSITION:  PACU - hemodynamically stable.   Delay start of Pharmacological VTE agent (>24hrs) due to surgical blood loss or risk of bleeding: not applicable  Details of procedure patient was taken operating room prepped and draped for combined abdominal and vaginal procedure. Timeout was conducted and surgical procedure confirmed by operative team. Cervix was grasped with single-tooth tenaculum and Hulka uterine manipulator. Infraumbilical suprapubic and right lower quadrant 5 mm and skin incisions were made with a #11 blade, followed by palpation abdomen palpation of the sacral promontory and orientation of the Veress needle toward the pelvis while carefully inserting it. The Veress needle was used to achieve pneumoperitoneum after water droplet technique confirmed intraperitoneal positioning in low resistance to water flow. Pneumoperitoneum was  achieved 3 L under 8 mmHg pressure and and the umbilical trocar inserted under laparoscopic guidance. Suprapubic and right lower quadrant trochars were inserted under direct intra-abdominal visualization, and attention was then directed to the pelvis. The uterus was well supported tiny mobile with atrophic ovaries bilaterally. The right fallopian tube was grasped elevated and amputated off of the mesosalpinx. Specimen was extracted. Second tube was treated similarly removed intact and and photodocumentation of hemostasis confirmed. Abdomen was deflated, 120 cc saline instilled in the abdomen, the abdomen carefully deflated sufficiently to remove maximal CO2, and the trochars removed without difficulty subcuticular 4-0 Vicryl was used to close the skin incisions. Steri-Strips and Band-Aids applied  Endometrial ablation. At this time the surgeon repositioned to place the speculum vaginally grasp the cervix with single-tooth tenaculum and sound the cervix and uterus to 7 cm in the anteflexed position,. Uterus is dilated to 25 [place] allowing introduction of the hysteroscope which visualize the endometrial cavity is atrophic tiny with smooth contours. Tubal ostia were visualized. The NovaSure endometrial ablation device was repaired with the endometrial cavity measured to 4 cm, the width was 3.3 cm. The endometrial ablation process took 1 minute 17 seconds under power 73. Patient completed the procedure and had removal of the equipment and paracervical block using 20 cc of Marcaine with epinephrine. Patient to recovery room in stable condition.

## 2016-01-16 NOTE — Anesthesia Postprocedure Evaluation (Signed)
Anesthesia Post Note  [name]  Procedure(s) Performed: Procedure(s) (LRB): HYSTEROSCOPY WITH NOVASURE ABLATION (N/A) LAPAROSCOPIC BILATERAL SALPINGECTOMY (Bilateral)  Patient location during evaluation: PACU Anesthesia Type: General Level of consciousness: awake and alert Pain management: pain level controlled Vital Signs Assessment: post-procedure vital signs reviewed and stable Respiratory status: spontaneous breathing Cardiovascular status: stable    Last Vitals:  Filed Vitals:   01/16/16 1311 01/16/16 1315  BP: 135/79 120/66  Pulse: 88 89  Temp: 36.4 C   Resp: 20 18    Last Pain:  Filed Vitals:   01/16/16 1324  PainSc: 2                  [name]

## 2016-01-16 NOTE — Interval H&P Note (Signed)
History and Physical Interval Note:  01/16/2016 11:32 AM  Theresa Washington  has presented today for surgery, with the diagnosis of menorrhagia sterlization  Due to her her history of being told she had Von Willebrand's disease after problems with bleeding on BCP's , she had coag studies including a Von Willebrand's panel, which is normal and describes the results as "not consistent with a diagnosis of VWD according to current NHLBI guideline."   .  The various methods of treatment have been discussed with the patient and family. After consideration of risks, benefits and other options for treatment, the patient has consented to  Procedure(s): DILATATION & CURETTAGE/HYSTEROSCOPY WITH NOVASURE ABLATION (N/A) LAPAROSCOPIC BILATERAL SALPINGECTOMY (Bilateral) as a surgical intervention .  The patient's history has been reviewed, patient examined, no change in status, stable for surgery.  I have reviewed the patient's chart and labs.  Questions were answered to the patient's satisfaction.     Jonnie Kind

## 2016-01-16 NOTE — Op Note (Signed)
Please see the brief operative note for surgical details 

## 2016-01-16 NOTE — Anesthesia Procedure Notes (Signed)
Procedure Name: Intubation Date/Time: 01/16/2016 12:17 PM Performed by: Vista Deck Pre-anesthesia Checklist: Patient identified, Patient being monitored, Timeout performed, Emergency Drugs available and Suction available Patient Re-evaluated:Patient Re-evaluated prior to inductionOxygen Delivery Method: Circle System Utilized Preoxygenation: Pre-oxygenation with 100% oxygen Intubation Type: IV induction Ventilation: Mask ventilation without difficulty Laryngoscope Size: Miller and 3 Grade View: Grade I Tube type: Oral Tube size: 7.0 mm Number of attempts: 1 Airway Equipment and Method: Stylet and Oral airway Placement Confirmation: ETT inserted through vocal cords under direct vision,  positive ETCO2 and breath sounds checked- equal and bilateral Secured at: 22 cm Tube secured with: Tape Dental Injury: Teeth and Oropharynx as per pre-operative assessment

## 2016-01-16 NOTE — Discharge Instructions (Signed)
Endometrial Ablation °Endometrial ablation removes the lining of the uterus (endometrium). It is usually a same-day, outpatient treatment. Ablation helps avoid major surgery, such as surgery to remove the cervix and uterus (hysterectomy). After endometrial ablation, you will have little or no menstrual bleeding and may not be able to have children. However, if you are premenopausal, you will need to use a reliable method of birth control following the procedure because of the small chance that pregnancy can occur. °There are different reasons to have this procedure. These reasons include: °· Heavy periods. °· Bleeding that is causing anemia. °· Irregular bleeding. °· Bleeding fibroids on the lining inside the uterus if they are smaller than 3 centimeters. °This procedure may not be possible for you if:  °· You want to have children in the future.   °· You have severe cramps with your menstrual period.   °· You have precancerous or cancerous cells in your uterus.   °· You were recently pregnant.   °· You have gone through menopause.   °· You have had major surgery on your uterus, resulting in thinning of the uterine wall. Surgeries may include: °¨ The removal of one or more uterine fibroids (myomectomy). °¨ A cesarean section with a classic (vertical) incision on your uterus. Ask your health care provider what type of cesarean you had. Sometimes the scar on your skin is different than the scar on your uterus. °Even if you have had surgery on your uterus, certain types of ablation may still be safe for you. Talk with your health care provider. °LET YOUR HEALTH CARE PROVIDER KNOW ABOUT: °· Any allergies you have. °· All medicines you are taking, including vitamins, herbs, eye drops, creams, and over-the-counter medicines. °· Previous problems you or members of your family have had with the use of anesthetics. °· Any blood disorders you have. °· Previous surgeries you have had. °· Medical conditions you have. °RISKS AND  COMPLICATIONS  °Generally, this is a safe procedure. However, as with any procedure, complications can occur. Possible complications include: °· Perforation of the uterus. °· Bleeding. °· Infection of the uterus, bladder, or vagina. °· Injury to surrounding organs. °· An air bubble to the lung (air embolus). °· Pregnancy following the procedure. °· Failure of the procedure to help the problem, requiring hysterectomy. °· Decreased ability to diagnose cancer in the lining of the uterus. °BEFORE THE PROCEDURE °· The lining of the uterus must be tested to make sure there is no pre-cancerous or cancer cells present. °· An ultrasound may be performed to look at the size of the uterus and to check for abnormalities. °· Medicines may be given to thin the lining of the uterus. °PROCEDURE  °During the procedure, your health care provider will use a tool called a resectoscope to help see inside your uterus. There are different ways to remove the lining of your uterus.  °· Radiofrequency - This method uses a radiofrequency-alternating electric current to remove the lining of the uterus. °· Cryotherapy - This method uses extreme cold to freeze the lining of the uterus. °· Heated-Free Liquid - This method uses heated salt (saline) solution to remove the lining of the uterus. °· Microwave - This method uses high-energy microwaves to heat up the lining of the uterus to remove it. °· Thermal balloon - This method involves inserting a catheter with a balloon tip into the uterus. The balloon tip is filled with heated fluid to remove the lining of the uterus. °AFTER THE PROCEDURE  °After your procedure, do   not have sexual intercourse or insert anything into your vagina until permitted by your health care provider. After the procedure, you may experience: °· Cramps. °· Vaginal discharge. °· Frequent urination. °  °This information is not intended to replace advice given to you by your health care provider. Make sure you discuss any  questions you have with your health care provider. °  °Document Released: 09/13/2004 Document Revised: 07/26/2015 Document Reviewed: 04/07/2013 °Elsevier Interactive Patient Education ©2016 Elsevier Inc. °Laparoscopic Tubal Ligation, Care After °Refer to this sheet in the next few weeks. These instructions provide you with information about caring for yourself after your procedure. Your health care provider may also give you more specific instructions. Your treatment has been planned according to current medical practices, but problems sometimes occur. Call your health care provider if you have any problems or questions after your procedure. °WHAT TO EXPECT AFTER THE PROCEDURE °After your procedure, it is common to have: °· Sore throat. °· Soreness at the incision site. °· Mild cramping. °· Tiredness. °· Mild nausea or vomiting. °· Shoulder pain. °HOME CARE INSTRUCTIONS °· Rest for the remainder of the day. °· Take medicines only as directed by your health care provider. These include over-the-counter medicines and prescription medicines. Do not take aspirin, which can cause bleeding. °· Over the next few days, gradually return to your normal activities and your normal diet. °· Avoid sexual intercourse for 2 weeks or as directed by your health care provider. °· Do not use tampons, and do not douche. °· Do not drive or operate heavy machinery while taking pain medicine. °· Do not lift anything that is heavier than 5 lb (2.3 kg) for 2 weeks or as directed by your health care provider. °· Do not take baths. Take showers only. Ask your health care provider when you can start taking baths. °· Take your temperature twice each day and write it down. °· Try to have help for your household needs for the first 7-10 days. °· There are many different ways to close and cover an incision, including stitches (sutures), skin glue, and adhesive strips. Follow instructions from your health care provider about: °¨ Incision  care. °¨ Bandage (dressing) changes and removal. °¨ Incision closure removal. °· Check your incision area every day for signs of infection. Watch for: °¨ Redness, swelling, or pain. °¨ Fluid, blood, or pus. °· Keep all follow-up visits as directed by your health care provider. °SEEK MEDICAL CARE IF: °· You have redness, swelling, or increasing pain in your incision area. °· You have fluid, blood, or pus coming from your incision for longer than 1 day. °· You notice a bad smell coming from your incision or your dressing. °· The edges of your incision break open after the sutures have been removed. °· Your pain does not decrease after 2-3 days. °· You have a rash. °· You repeatedly become dizzy or light-headed. °· You have a reaction to your medicine. °· Your pain medicine is not helping. °· You are constipated. °SEEK IMMEDIATE MEDICAL CARE IF: °· You have a fever. °· You faint. °· You have increasing pain in your abdomen. °· You have severe pain in one or both of your shoulders. °· You have bleeding or drainage from your suture sites or your vagina after surgery. °· You have shortness of breath or have difficulty breathing. °· You have chest pain or leg pain. °· You have ongoing nausea, vomiting, or diarrhea. °  °This information is not intended to replace   advice given to you by your health care provider. Make sure you discuss any questions you have with your health care provider. °  °Document Released: 05/24/2005 Document Revised: 03/21/2015 Document Reviewed: 02/15/2012 °Elsevier Interactive Patient Education ©2016 Elsevier Inc. ° °

## 2016-01-16 NOTE — Anesthesia Preprocedure Evaluation (Signed)
Anesthesia Evaluation  Patient identified by MRN, date of birth, ID band Patient awake    Reviewed: Allergy & Precautions, NPO status , Patient's Chart, lab work & pertinent test results  Airway Mallampati: I       Dental  (+) Teeth Intact, Dental Advisory Given   Pulmonary COPD, Current Smoker,    breath sounds clear to auscultation       Cardiovascular hypertension, Pt. on medications  Rhythm:Regular Rate:Normal     Neuro/Psych Seizures -, Well Controlled,  PSYCHIATRIC DISORDERS Anxiety    GI/Hepatic negative GI ROS,   Endo/Other    Renal/GU      Musculoskeletal  (+) Fibromyalgia -  Abdominal   Peds  Hematology  (+) Blood dyscrasia (hx Von Willebrands notconfirmed by lab testing.), ,   Anesthesia Other Findings   Reproductive/Obstetrics                             Anesthesia Physical Anesthesia Plan  ASA: II  Anesthesia Plan: General   Post-op Pain Management:    Induction: Intravenous  Airway Management Planned: Oral ETT  Additional Equipment:   Intra-op Plan:   Post-operative Plan: Extubation in OR  Informed Consent: I have reviewed the patients History and Physical, chart, labs and discussed the procedure including the risks, benefits and alternatives for the proposed anesthesia with the patient or authorized representative who has indicated his/her understanding and acceptance.     Plan Discussed with:   Anesthesia Plan Comments:         Anesthesia Quick Evaluation

## 2016-01-16 NOTE — H&P (View-Only) (Signed)
Jonnie Kind, MD at 01/10/2016 12:01 PM     Status: Signed       Expand All Collapse All   Patient ID: [name], female DOB: 02-19-1981, 35 y.o. MRN: [id number] Preoperative History and Physical  Theresa Washington is a 35 y.o. G2P2 here for surgical management of sterilization. No significant preoperative concerns.  Proposed surgery: laparoscopic bilateral salpingectomy, endometrial ablation   Past Medical History  Diagnosis Date  . Von Willebrand disease ([place])   . Seizures ([place])   . Bell's palsy   . Back pain   . Degenerative disc disease   . DDD (degenerative disc disease)   . Spondylosis   . Fibromyalgia   . DDD (degenerative disc disease)   . Ankylosing spondylitis ([place])   . Fibromyalgia   . PVC (premature ventricular contraction)   . Palpitations   . Anemia   . Anxiety   . Kidney stone   . Myositis   . 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 Contraceptive management 11/30/2015  . Smoker 11/30/2015   Past Surgical History  Procedure Laterality Date  . Oophorectomy      Left side   OB History  Gravida Para Term Preterm AB SAB TAB Ectopic Multiple Living  2 2        2     # Outcome Date GA Lbr Len/2nd Weight Sex Delivery Anes PTL Lv  2 Para           1 Para             Patient denies any other pertinent gynecologic issues.   Current Outpatient Prescriptions on File Prior to Visit  Medication Sig Dispense Refill  . Aspirin-Salicylamide-Caffeine (BC FAST PAIN RELIEF ARTHRITIS) [id number] MG PACK Take 1 packet by mouth daily.    . clobetasol ointment (TEMOVATE) [id number] % Apply 1 application topically daily as needed (for rash).     . diclofenac sodium (VOLTAREN) 1 % GEL Apply 1 application topically daily as needed. For pain 100 g 3  . etonogestrel-ethinyl estradiol (NUVARING) 0.12-0.015 MG/24HR vaginal ring  insert 1 ring vaginally for 3 weeks REMOVE for 1 week and repeat 1 each 2  . hydrochlorothiazide (HYDRODIURIL) 25 MG tablet Take 1 tablet (25 mg total) by mouth daily. 90 tablet 3  . methylcellulose (ARTIFICIAL TEARS) 1 % ophthalmic solution Place 1 drop into both eyes daily as needed. Dry Eyes    . nebivolol (BYSTOLIC) 5 MG tablet Take 1 tablet (5 mg total) by mouth daily. 30 tablet 11   No current facility-administered medications on file prior to visit.   Allergies  Allergen Reactions  . Shrimp [Shellfish Allergy] Anaphylaxis, Hives and Swelling  . Other     Pt states "I carry an Epipen. I had anaphylactic reaction to something but I don't know what".     Social History:  reports that she has been smoking Cigarettes. She has a 20 pack-year smoking history. She has never used smokeless tobacco. She reports that she does not drink alcohol or use illicit drugs.  Family History  Problem Relation Age of Onset  . Heart disease Father   . Kidney disease Father   . Alcohol abuse Brother   . Mental retardation Brother   . Schizophrenia Brother   . Colon cancer Maternal Grandmother   . Other Brother     back problems    Review of Systems: Noncontributory  PHYSICAL EXAM: Blood pressure 110/70, pulse 74, height 5\' 3"  (1.6 m), weight 138 lb (  62.596 kg). General appearance - alert, well appearing, and in no distress Chest - clear to auscultation, no wheezes, rales or rhonchi, symmetric air entry Heart - normal rate and regular rhythm Abdomen - soft, nontender, nondistended, no masses or organomegaly Pelvic - examination, cervix normal bimanual nontender mobile uterus. Adnexa negative Extremities - peripheral pulses normal, no pedal edema, no clubbing or cyanosis  Labs: No results found for this or any previous visit (from the past 336 hour(s)).  Imaging Studies:  Imaging Results    No results found.     Assessment: Patient Active Problem List   Diagnosis Date Noted  . Consultation for female sterilization 01/01/2016  . Contraceptive management 11/30/2015  . Smoker 11/30/2015  . Palpitations 04/27/2014  . Pre-syncope 04/26/2014  . Near syncope 04/26/2014  . Chest pain 04/26/2014  . Hip pain 12/12/2013  . Acute sinusitis 10/15/2013  . Fibromyalgia 02/10/2013  . Somatization disorder 02/10/2013  . Pseudoseizures 02/10/2013  . DDD (degenerative disc disease), lumbosacral 02/10/2013  . Von Willebrand disease ([place]) 02/10/2013  . SMOKER 05/20/2010  . SORE THROAT 05/20/2010  . ACUTE BRONCHITIS 05/20/2010  . CHRONIC OBSTRUCTIVE PULMONARY DISEASE 05/20/2010  . FOREIGN BODY, EAR, RIGHT 05/20/2010  . SHOULDER PAIN 10/27/2008  . NECK PAIN 10/27/2008    Plan: Patient will undergo surgical management with laparoscopic bilateral salpingectomy, endometrial ablation. Scheduled for next Tuesday 28 Feb 17   Jonnie Kind, MD  01/10/2016 12:07 PM

## 2016-01-17 ENCOUNTER — Encounter (HOSPITAL_COMMUNITY): Payer: Self-pay | Admitting: Obstetrics and Gynecology

## 2016-01-17 NOTE — Telephone Encounter (Signed)
Unable to contact pt by phone. Von Willebrand's testing not yet completed, and surgery may need postponing. Will check with lab later this am.and discuss with pt upon her arrival at hospital

## 2016-01-17 NOTE — Telephone Encounter (Signed)
, °

## 2016-01-23 ENCOUNTER — Encounter: Payer: Medicaid Other | Admitting: Obstetrics and Gynecology

## 2016-01-24 ENCOUNTER — Ambulatory Visit (INDEPENDENT_AMBULATORY_CARE_PROVIDER_SITE_OTHER): Payer: Medicare Other | Admitting: Obstetrics and Gynecology

## 2016-01-24 ENCOUNTER — Encounter: Payer: Self-pay | Admitting: Obstetrics and Gynecology

## 2016-01-24 VITALS — BP 120/72 | Ht 63.0 in | Wt 137.0 lb

## 2016-01-24 DIAGNOSIS — Z7689 Persons encountering health services in other specified circumstances: Secondary | ICD-10-CM

## 2016-01-24 DIAGNOSIS — Z3009 Encounter for other general counseling and advice on contraception: Secondary | ICD-10-CM

## 2016-01-24 DIAGNOSIS — D6800 Von Willebrand disease, unspecified: Secondary | ICD-10-CM

## 2016-01-24 DIAGNOSIS — Z9889 Other specified postprocedural states: Secondary | ICD-10-CM

## 2016-01-24 DIAGNOSIS — Z302 Encounter for sterilization: Secondary | ICD-10-CM

## 2016-01-24 NOTE — Progress Notes (Signed)
Patient ID: Theresa Washington, female   DOB: 10-18-1981, 35 y.o.   MRN: [id number]  Subjective:  Theresa Washington is a 35 y.o. female now 1 weeks status post laparoscopic bilateral salpingectomy and Novasure ablation. She has no complaints at this time.   Review of Systems Negative except as noted above   Diet:   normal   Bowel movements : normal.  The patient is not having any pain.  Objective:  BP 120/72 mmHg  Ht 5\' 3"  (1.6 m)  Wt 137 lb (62.143 kg)  BMI 24.27 kg/m2 General:Well developed, well nourished.  No acute distress. Abdomen: Bowel sounds normal, soft, non-tender.  Incision(s):   Healing well, no drainage, no erythema, no hernia, no swelling, no dehiscence,     Assessment:  Post-Op 1 weeks s/p laparoscopic bilateral salpingectomy and Novasure ablation.    Doing well postoperatively.   Plan:  1.Wound care discussed   2. . current medications. n/a 3. Activity restrictions: none 4. return to work: not applicable. 5. Follow up for pap smear every 3 years.  6. NORMAL  Von Willebrand's panel results discussed.    By signing my name below, I, Stephania Fragmin, attest that this documentation has been prepared under the direction and in the presence of Jonnie Kind, MD. Electronically Signed: Stephania Fragmin, ED Scribe. 01/24/2016. 11:34 AM.  I personally performed the services described in this documentation, which was SCRIBED in my presence. The recorded information has been reviewed and considered accurate. It has been edited as necessary during review. Jonnie Kind, MD

## 2016-01-24 NOTE — Progress Notes (Signed)
Patient ID: Theresa Washington, female   DOB: 1981-03-16, 35 y.o.   MRN: [id number] Pt here today for post op visit. Pt denies any problems or concerns at this time.

## 2016-02-01 ENCOUNTER — Other Ambulatory Visit: Payer: Self-pay | Admitting: Family Medicine

## 2016-02-01 MED ORDER — NEBIVOLOL HCL 5 MG PO TABS: 5.0000 mg | ORAL_TABLET | Freq: Every day | ORAL | Status: DC

## 2016-02-26 ENCOUNTER — Ambulatory Visit: Payer: Medicare Other | Admitting: Family Medicine

## 2016-03-05 ENCOUNTER — Encounter: Payer: Self-pay | Admitting: Family Medicine

## 2016-04-12 ENCOUNTER — Telehealth: Payer: Self-pay | Admitting: *Deleted

## 2016-04-12 NOTE — Telephone Encounter (Signed)
Received request from pharmacy for PA on Bystolic.   PA submitted.   Dx: HTN I10

## 2016-04-16 NOTE — Telephone Encounter (Signed)
Received PA determination.   PA approved 04/13/2016- 11/17/2016.  PA Case: [id number].  Pharmacy made aware.

## 2016-05-03 ENCOUNTER — Ambulatory Visit (INDEPENDENT_AMBULATORY_CARE_PROVIDER_SITE_OTHER): Payer: Medicare Other | Admitting: Family Medicine

## 2016-05-03 ENCOUNTER — Encounter: Payer: Self-pay | Admitting: Family Medicine

## 2016-05-03 VITALS — BP 126/70 | HR 78 | Temp 98.2°F | Resp 14 | Ht 62.0 in | Wt 138.0 lb

## 2016-05-03 DIAGNOSIS — M797 Fibromyalgia: Secondary | ICD-10-CM

## 2016-05-03 DIAGNOSIS — M7581 Other shoulder lesions, right shoulder: Secondary | ICD-10-CM

## 2016-05-03 MED ORDER — KETOROLAC TROMETHAMINE 10 MG PO TABS: 10.0000 mg | ORAL_TABLET | Freq: Four times a day (QID) | ORAL | Status: DC | PRN
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
## 2016-05-27 ENCOUNTER — Telehealth: Payer: Self-pay | Admitting: Family Medicine

## 2016-05-27 NOTE — Telephone Encounter (Signed)
Pt is requesting a refill of Clobetsol ointment [facility]

## 2016-05-28 MED ORDER — CLOBETASOL PROPIONATE 0.05 % EX OINT: 1.0000 "application " | TOPICAL_OINTMENT | Freq: Every day | CUTANEOUS | Status: DC | PRN

## 2016-05-28 NOTE — Telephone Encounter (Signed)
Medication called/sent to requested pharmacy  

## 2016-08-26 ENCOUNTER — Other Ambulatory Visit: Payer: Self-pay | Admitting: Family Medicine

## 2016-09-16 ENCOUNTER — Encounter: Payer: Self-pay | Admitting: Family Medicine

## 2016-09-16 ENCOUNTER — Ambulatory Visit (INDEPENDENT_AMBULATORY_CARE_PROVIDER_SITE_OTHER): Payer: Medicare Other | Admitting: Family Medicine

## 2016-09-16 VITALS — BP 118/68 | HR 68 | Temp 99.0°F | Resp 14 | Ht 62.0 in | Wt 144.0 lb

## 2016-09-16 DIAGNOSIS — M533 Sacrococcygeal disorders, not elsewhere classified: Secondary | ICD-10-CM | POA: Diagnosis not present

## 2016-09-16 MED ORDER — KETOROLAC TROMETHAMINE 10 MG PO TABS: 10.0000 mg | ORAL_TABLET | Freq: Four times a day (QID) | ORAL | 0 refills | Status: DC | PRN

## 2016-09-16 NOTE — Progress Notes (Signed)
Subjective:    Patient ID: Theresa Washington, female    DOB: 02-Sep-1981, 35 y.o.   MRN: [id number]  HPI  Patient is a 35 year old white female with a history of fibromyalgia. She reports a three-week history of pain around her tailbone. Exam was performed with a chaperone present. On examination she is tender at the tip of the coccyx. However she is also tender approximately 1 inch above the tip of the coccyx near the S3 foramen. She states that is painful to sit in this area. She also states that she has significant pain in this area with sexual intercourse however it is position dependent. She denies any falls or injuries. She denies any saddle anesthesia. She denies any vaginal discharge or vaginal bleeding. She denies any fevers or chills. Past Medical History:  Diagnosis Date  . Anemia   . Ankylosing spondylitis ([place])   . Anxiety   . Back pain   . Bell's palsy   . Contraceptive management 11/30/2015  . DDD (degenerative disc disease)   . DDD (degenerative disc disease)   . Degenerative disc disease   . Fibromyalgia   . Fibromyalgia   . History of kidney stones   . Hypertension   . 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[place])   . Myositis   . Palpitations   . PVC (premature ventricular contraction)   . Seizures ([place])    unknown etiolgy; no meds and no seizures for 2 years.  . Smoker 11/30/2015  . Spondylosis   . Von Willebrand disease ([place])    Past Surgical History:  Procedure Laterality Date  . DILATION AND CURETTAGE OF UTERUS  1998  . DILITATION & CURRETTAGE/HYSTROSCOPY WITH NOVASURE ABLATION N/A 01/16/2016   Procedure: HYSTEROSCOPY WITH NOVASURE ABLATION;  Surgeon: Jonnie Kind, MD;  Location: [facility];  Service: Gynecology;  Laterality: N/A;  Uterine Cavity Length 4.0 cm Uterine Cavity Width 3.3cm Power 73  Time 1 minute 17seconds  . LAPAROSCOPIC BILATERAL SALPINGECTOMY Bilateral 01/16/2016   Procedure: LAPAROSCOPIC BILATERAL SALPINGECTOMY;  Surgeon: Jonnie Kind, MD;  Location: [facility];   Service: Gynecology;  Laterality: Bilateral;  procedure 1  . OOPHORECTOMY  2006   Left side   Current Outpatient Prescriptions on File Prior to Visit  Medication Sig Dispense Refill  . Aspirin-Salicylamide-Caffeine (BC FAST PAIN RELIEF ARTHRITIS) [id number] MG PACK Take 1 packet by mouth daily.    [name] BYSTOLIC 5 MG tablet take 1 tablet by mouth once daily 30 tablet 5  . clobetasol ointment (TEMOVATE) [id number] % Apply 1 application topically daily as needed (for rash). 45 g 2  . diclofenac sodium (VOLTAREN) 1 % GEL Apply 1 application topically daily as needed. For pain 100 g 3  . hydrochlorothiazide (HYDRODIURIL) 25 MG tablet Take 1 tablet (25 mg total) by mouth daily. 90 tablet 3  . ketorolac (TORADOL) 10 MG tablet Take 1 tablet (10 mg total) by mouth every 6 (six) hours as needed. 20 tablet 0  . methylcellulose (ARTIFICIAL TEARS) 1 % ophthalmic solution Place 1 drop into both eyes daily as needed. Dry Eyes    . NUVARING 0.12-0.015 MG/24HR vaginal ring   0   No current facility-administered medications on file prior to visit.    Allergies  Allergen Reactions  . Shrimp [Shellfish Allergy] Anaphylaxis, Hives and Swelling  . Other     Pt states "I carry an Epipen. I had anaphylactic reaction to something but I don't know what".    Social History   Social History  . Marital  status: Married    Spouse name: N/A  . Number of children: N/A  . Years of education: N/A   Occupational History  . Not on file.   Social History Main Topics  . Smoking status: Former Smoker    Packs/day: 1.00    Years: 20.00    Types: Cigarettes    Quit date: 09/12/2016  . Smokeless tobacco: Never Used  . Alcohol use No  . Drug use: No  . Sexual activity: Yes    Birth control/ protection: Surgical   Other Topics Concern  . Not on file   Social History Narrative  . No narrative on file       Review of Systems  All other systems reviewed and are negative.      Objective:   Physical Exam    Cardiovascular: Normal rate, regular rhythm and normal heart sounds.   Pulmonary/Chest: Effort normal and breath sounds normal. No respiratory distress. She has no wheezes. She has no rales.  Abdominal: Soft. Bowel sounds are normal.  Musculoskeletal:       Lumbar back: She exhibits bony tenderness. She exhibits normal range of motion and no tenderness.       Back:  Vitals reviewed.  tender to palpation in the area demarcated by red eczema diagram. There is no palpable abnormality in that area or mass.        Assessment & Plan:  Coccydynia - Plan: ketorolac (TORADOL) 10 MG tablet  Patient has coccydynia. Begin Toradol 10 mg every 6 hours for the next 4 days. Recommended a hemorrhoid pillow whenever she is sitting in a chair. Recommended avoiding positions that exacerbate pain. If no better in 2 weeks, proceed with x-rays of that area.

## 2016-10-24 ENCOUNTER — Ambulatory Visit (HOSPITAL_COMMUNITY)
Admission: RE | Admit: 2016-10-24 | Discharge: 2016-10-24 | Disposition: A | Discharge status: Home / Self Care | Payer: Medicare Other | Source: Ambulatory Visit | Attending: Family Medicine | Admitting: Family Medicine

## 2016-10-24 ENCOUNTER — Encounter: Payer: Self-pay | Admitting: Family Medicine

## 2016-10-24 ENCOUNTER — Ambulatory Visit (INDEPENDENT_AMBULATORY_CARE_PROVIDER_SITE_OTHER): Payer: Medicare Other | Admitting: Family Medicine

## 2016-10-24 VITALS — BP 134/88 | HR 78 | Temp 98.0°F | Resp 18 | Ht 62.0 in | Wt 143.0 lb

## 2016-10-24 DIAGNOSIS — M5432 Sciatica, left side: Secondary | ICD-10-CM | POA: Diagnosis not present

## 2016-10-24 DIAGNOSIS — S3992XA Unspecified injury of lower back, initial encounter: Secondary | ICD-10-CM | POA: Diagnosis not present

## 2016-10-24 DIAGNOSIS — M545 Low back pain: Secondary | ICD-10-CM | POA: Diagnosis not present

## 2016-10-24 LAB — POCT PREGNANCY, URINE: Preg Test, Ur: NEGATIVE

## 2016-10-24 MED ORDER — PREDNISONE 20 MG PO TABS: ORAL_TABLET | ORAL | 0 refills | Status: DC

## 2016-10-24 NOTE — Progress Notes (Signed)
Subjective:    Patient ID: Theresa Washington, female    DOB: [birth year], 35 y.o.   MRN: [id number]  HPI   09/16/16 Patient is a 35 year old white female with a history of fibromyalgia. She reports a three-week history of pain around her tailbone. Exam was performed with a chaperone present. On examination she is tender at the tip of the coccyx. However she is also tender approximately 1 inch above the tip of the coccyx near the S3 foramen. She states that is painful to sit in this area. She also states that she has significant pain in this area with sexual intercourse however it is position dependent. She denies any falls or injuries. She denies any saddle anesthesia. She denies any vaginal discharge or vaginal bleeding. She denies any fevers or chills.  At that time, my plan was:  Patient has coccydynia. Begin Toradol 10 mg every 6 hours for the next 4 days. Recommended a hemorrhoid pillow whenever she is sitting in a chair. Recommended avoiding positions that exacerbate pain. If no better in 2 weeks, proceed with x-rays of that area.  10/24/16 Patient's tailbone has stopped hurting. However since that time, she has been having burning pain getting near the sciatic notch in the posterior left gluteus radiating down her hamstring below her knee into her calf. The pain is neuropathic in nature. It is burning and stinging. She denies any numbness but she does report some tingling in her feet. An MRI obtained of the lumbar spine in 2015 did show a left shallow bulging disc between L5-S1 contacting the left S1 nerve root. It is likely that she has reaggravated this after her recent fall. The pain has been constantly worsening over the last 2 weeks Past Medical History:  Diagnosis Date  . Anemia   . Ankylosing spondylitis ([place])   . Anxiety   . Back pain   . Bell's palsy   . Contraceptive management 11/30/2015  . DDD (degenerative disc disease)   . DDD (degenerative disc disease)   . Degenerative  disc disease   . Fibromyalgia   . Fibromyalgia   . History of kidney stones   . Hypertension   . 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[place])   . Myositis   . Palpitations   . PVC (premature ventricular contraction)   . Seizures ([place])    unknown etiolgy; no meds and no seizures for 2 years.  . Smoker 11/30/2015  . Spondylosis   . Von Willebrand disease ([place])    Past Surgical History:  Procedure Laterality Date  . DILATION AND CURETTAGE OF UTERUS  1998  . DILITATION & CURRETTAGE/HYSTROSCOPY WITH NOVASURE ABLATION N/A 01/16/2016   Procedure: HYSTEROSCOPY WITH NOVASURE ABLATION;  Surgeon: Jonnie Kind, MD;  Location: [facility];  Service: Gynecology;  Laterality: N/A;  Uterine Cavity Length 4.0 cm Uterine Cavity Width 3.3cm Power 73  Time 1 minute 17seconds  . LAPAROSCOPIC BILATERAL SALPINGECTOMY Bilateral 01/16/2016   Procedure: LAPAROSCOPIC BILATERAL SALPINGECTOMY;  Surgeon: Jonnie Kind, MD;  Location: [facility];  Service: Gynecology;  Laterality: Bilateral;  procedure 1  . OOPHORECTOMY  2006   Left side   Current Outpatient Prescriptions on File Prior to Visit  Medication Sig Dispense Refill  . Aspirin-Salicylamide-Caffeine (BC FAST PAIN RELIEF ARTHRITIS) [id number] MG PACK Take 1 packet by mouth daily.    [name] BYSTOLIC 5 MG tablet take 1 tablet by mouth once daily 30 tablet 5  . clobetasol ointment (TEMOVATE) [id number] % Apply 1 application topically daily as needed (for  rash). 45 g 2  . diclofenac sodium (VOLTAREN) 1 % GEL Apply 1 application topically daily as needed. For pain 100 g 3  . hydrochlorothiazide (HYDRODIURIL) 25 MG tablet Take 1 tablet (25 mg total) by mouth daily. 90 tablet 3  . ketorolac (TORADOL) 10 MG tablet Take 1 tablet (10 mg total) by mouth every 6 (six) hours as needed. 20 tablet 0  . methylcellulose (ARTIFICIAL TEARS) 1 % ophthalmic solution Place 1 drop into both eyes daily as needed. Dry Eyes    . NUVARING 0.12-0.015 MG/24HR vaginal ring   0   No current facility-administered  medications on file prior to visit.    Allergies  Allergen Reactions  . Shrimp [Shellfish Allergy] Anaphylaxis, Hives and Swelling  . Other     Pt states "I carry an Epipen. I had anaphylactic reaction to something but I don't know what".    Social History   Social History  . Marital status: Married    Spouse name: N/A  . Number of children: N/A  . Years of education: N/A   Occupational History  . Not on file.   Social History Main Topics  . Smoking status: Former Smoker    Packs/day: 1.00    Years: 20.00    Types: Cigarettes    Quit date: 09/12/2016  . Smokeless tobacco: Never Used  . Alcohol use No  . Drug use: No  . Sexual activity: Yes    Birth control/ protection: Surgical   Other Topics Concern  . Not on file   Social History Narrative  . No narrative on file       Review of Systems  Musculoskeletal: Positive for back pain.  All other systems reviewed and are negative.      Objective:   Physical Exam  Cardiovascular: Normal rate, regular rhythm and normal heart sounds.   Pulmonary/Chest: Effort normal and breath sounds normal. No respiratory distress. She has no wheezes. She has no rales.  Abdominal: Soft. Bowel sounds are normal.  Musculoskeletal:       Lumbar back: She exhibits bony tenderness. She exhibits normal range of motion and no tenderness.       Back:  Vitals reviewed.  Negative straight leg raise. Muscle strength is 5 over 5 equal and symmetric in both legs. She has normal 2 over 4 reflexes at the knee and Achilles bilaterally        Assessment & Plan:  Left sided sciatica - Plan: DG Lumbar Spine Complete, predniSONE (DELTASONE) 20 MG tablet  Begin prednisone taper pack. Obtain x-ray of lumbar spine given the fact this occurred after trauma. If symptoms are persistent and x-ray provides no abnormality, proceed with an MRI of the lumbar spine possibly an epidural steroid injection

## 2016-10-25 ENCOUNTER — Encounter: Payer: Self-pay | Admitting: Family Medicine

## 2016-10-29 ENCOUNTER — Telehealth: Payer: Self-pay | Admitting: Family Medicine

## 2016-10-29 DIAGNOSIS — M5442 Lumbago with sciatica, left side: Principal | ICD-10-CM

## 2016-10-29 DIAGNOSIS — M5441 Lumbago with sciatica, right side: Secondary | ICD-10-CM

## 2016-10-29 NOTE — Telephone Encounter (Signed)
Suggest MRI of L spine  For sciatica.

## 2016-10-29 NOTE — Telephone Encounter (Signed)
Patient calling to say that dr pickard told her to call if her back was not any better, she is still having a lot of pain  Please call and advise  [phone]

## 2016-10-30 NOTE — Telephone Encounter (Signed)
Patient aware of providers recommendations. MRI ordered.

## 2016-10-31 ENCOUNTER — Ambulatory Visit (HOSPITAL_COMMUNITY)
Admission: RE | Admit: 2016-10-31 | Discharge: 2016-10-31 | Disposition: A | Discharge status: Home / Self Care | Payer: BLUE CROSS/BLUE SHIELD | Source: Ambulatory Visit | Attending: Family Medicine | Admitting: Family Medicine

## 2016-10-31 ENCOUNTER — Other Ambulatory Visit: Payer: Self-pay | Admitting: Family Medicine

## 2016-10-31 DIAGNOSIS — M5127 Other intervertebral disc displacement, lumbosacral region: Secondary | ICD-10-CM | POA: Diagnosis not present

## 2016-10-31 DIAGNOSIS — M5442 Lumbago with sciatica, left side: Secondary | ICD-10-CM | POA: Diagnosis not present

## 2016-10-31 DIAGNOSIS — M5126 Other intervertebral disc displacement, lumbar region: Secondary | ICD-10-CM | POA: Diagnosis not present

## 2016-10-31 DIAGNOSIS — M5441 Lumbago with sciatica, right side: Secondary | ICD-10-CM | POA: Insufficient documentation

## 2016-10-31 DIAGNOSIS — M545 Low back pain: Secondary | ICD-10-CM | POA: Diagnosis not present

## 2016-11-01 ENCOUNTER — Other Ambulatory Visit: Payer: Self-pay | Admitting: Family Medicine

## 2016-11-01 ENCOUNTER — Telehealth: Payer: Self-pay | Admitting: Family Medicine

## 2016-11-01 DIAGNOSIS — M5441 Lumbago with sciatica, right side: Secondary | ICD-10-CM

## 2016-11-01 NOTE — Telephone Encounter (Signed)
-----   Message from Susy Frizzle, MD sent at 11/01/2016  7:24 AM EST ----- MRI shows pinched nerve on left at l5-s1.  Schedule epidural steroid injection at [facility] if patient agrees.

## 2016-11-01 NOTE — Telephone Encounter (Signed)
Pt aware of results.  Agrees to epidural injections, wants to have done in [place].  Order placed.

## 2016-11-04 ENCOUNTER — Other Ambulatory Visit: Payer: Self-pay | Admitting: Family Medicine

## 2016-11-04 DIAGNOSIS — M5441 Lumbago with sciatica, right side: Secondary | ICD-10-CM

## 2016-11-12 ENCOUNTER — Ambulatory Visit
Admission: RE | Admit: 2016-11-12 | Discharge: 2016-11-12 | Disposition: A | Discharge status: Home / Self Care | Payer: Medicare Other | Source: Ambulatory Visit | Attending: Family Medicine | Admitting: Family Medicine

## 2016-11-12 VITALS — BP 115/70 | HR 57

## 2016-11-12 DIAGNOSIS — M5137 Other intervertebral disc degeneration, lumbosacral region: Secondary | ICD-10-CM

## 2016-11-12 DIAGNOSIS — M5441 Lumbago with sciatica, right side: Secondary | ICD-10-CM

## 2016-11-12 DIAGNOSIS — M5126 Other intervertebral disc displacement, lumbar region: Secondary | ICD-10-CM | POA: Diagnosis not present

## 2016-11-12 MED ORDER — IOPAMIDOL (ISOVUE-M 200) INJECTION 41%
1.0000 mL | Freq: Once | INTRAMUSCULAR | Status: AC
  Administered 2016-11-12: 1 mL via EPIDURAL

## 2016-11-12 MED ORDER — METHYLPREDNISOLONE ACETATE 40 MG/ML INJ SUSP (RADIOLOG
120.0000 mg | Freq: Once | INTRAMUSCULAR | Status: AC
  Administered 2016-11-12: 120 mg via EPIDURAL

## 2016-11-12 NOTE — Discharge Instructions (Signed)

## 2017-01-27 ENCOUNTER — Ambulatory Visit: Payer: Medicare Other | Admitting: Family Medicine

## 2017-01-31 ENCOUNTER — Encounter: Payer: Self-pay | Admitting: Family Medicine

## 2017-01-31 ENCOUNTER — Ambulatory Visit (HOSPITAL_COMMUNITY)
Admission: RE | Admit: 2017-01-31 | Discharge: 2017-01-31 | Disposition: A | Discharge status: Home / Self Care | Payer: Medicare Other | Source: Ambulatory Visit | Attending: Family Medicine | Admitting: Family Medicine

## 2017-01-31 ENCOUNTER — Other Ambulatory Visit: Payer: Self-pay | Admitting: Family Medicine

## 2017-01-31 ENCOUNTER — Ambulatory Visit (INDEPENDENT_AMBULATORY_CARE_PROVIDER_SITE_OTHER): Payer: Medicare Other | Admitting: Family Medicine

## 2017-01-31 VITALS — BP 132/92 | HR 78 | Temp 98.3°F | Resp 14 | Ht 62.0 in | Wt 152.0 lb

## 2017-01-31 DIAGNOSIS — M546 Pain in thoracic spine: Secondary | ICD-10-CM | POA: Diagnosis not present

## 2017-01-31 DIAGNOSIS — M549 Dorsalgia, unspecified: Secondary | ICD-10-CM | POA: Insufficient documentation

## 2017-01-31 MED ORDER — TIZANIDINE HCL 4 MG PO CAPS: 4.0000 mg | ORAL_CAPSULE | Freq: Three times a day (TID) | ORAL | 0 refills | Status: DC

## 2017-01-31 NOTE — Progress Notes (Signed)
Subjective:    Patient ID: Theresa Washington, female    DOB: 06/05/1981, 36 y.o.   MRN: [id number]  Back Pain      09/16/16 Patient is a 36 year old white female with a history of fibromyalgia. She reports a three-week history of pain around her tailbone. Exam was performed with a chaperone present. On examination she is tender at the tip of the coccyx. However she is also tender approximately 1 inch above the tip of the coccyx near the S3 foramen. She states that is painful to sit in this area. She also states that she has significant pain in this area with sexual intercourse however it is position dependent. She denies any falls or injuries. She denies any saddle anesthesia. She denies any vaginal discharge or vaginal bleeding. She denies any fevers or chills.  At that time, my plan was:  Patient has coccydynia. Begin Toradol 10 mg every 6 hours for the next 4 days. Recommended a hemorrhoid pillow whenever she is sitting in a chair. Recommended avoiding positions that exacerbate pain. If no better in 2 weeks, proceed with x-rays of that area.  10/24/16 Patient's tailbone has stopped hurting. However since that time, she has been having burning pain getting near the sciatic notch in the posterior left gluteus radiating down her hamstring below her knee into her calf. The pain is neuropathic in nature. It is burning and stinging. She denies any numbness but she does report some tingling in her feet. An MRI obtained of the lumbar spine in 2015 did show a left shallow bulging disc between L5-S1 contacting the left S1 nerve root. It is likely that she has reaggravated this after her recent fall. The pain has been constantly worsening over the last 2 weeks.  At that time, my plan was: Begin prednisone taper pack. Obtain x-ray of lumbar spine given the fact this occurred after trauma. If symptoms are persistent and x-ray provides no abnormality, proceed with an MRI of the lumbar spine possibly an  epidural steroid injection  01/31/17 Ports more than a week of pain between her shoulder blades at approximately the level of T3. The pain is more to the left side. There is no tenderness to palpation in that area. She does report some mild spinous process tenderness to palpation. There is no visible deformity. She has full flexion and extension of her back. She is able to bend over and touch her toes. She does report some mild pleurisy. She denies any hemoptysis cough or chest pain. She denies any shortness of breath. The pleurisy that she reports more for muscle tenderness in her back when she takes deep breath in sounding more muscular in nature Past Medical History:  Diagnosis Date  . Anemia   . Ankylosing spondylitis ([place])   . Anxiety   . Back pain   . Bell's palsy   . Contraceptive management 11/30/2015  . DDD (degenerative disc disease)   . DDD (degenerative disc disease)   . Degenerative disc disease   . Fibromyalgia   . Fibromyalgia   . History of kidney stones   . Hypertension   . 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[place])   . Myositis   . Palpitations   . PVC (premature ventricular contraction)   . Seizures ([place])    unknown etiolgy; no meds and no seizures for 2 years.  . Smoker 11/30/2015  . Spondylosis   . Von Willebrand disease ([place])    Past Surgical History:  Procedure Laterality Date  . DILATION  AND CURETTAGE OF UTERUS  1998  . DILITATION & CURRETTAGE/HYSTROSCOPY WITH NOVASURE ABLATION N/A 01/16/2016   Procedure: HYSTEROSCOPY WITH NOVASURE ABLATION;  Surgeon: Jonnie Kind, MD;  Location: [facility];  Service: Gynecology;  Laterality: N/A;  Uterine Cavity Length 4.0 cm Uterine Cavity Width 3.3cm Power 73  Time 1 minute 17seconds  . LAPAROSCOPIC BILATERAL SALPINGECTOMY Bilateral 01/16/2016   Procedure: LAPAROSCOPIC BILATERAL SALPINGECTOMY;  Surgeon: Jonnie Kind, MD;  Location: [facility];  Service: Gynecology;  Laterality: Bilateral;  procedure 1  . OOPHORECTOMY  2006   Left side    Current Outpatient Prescriptions on File Prior to Visit  Medication Sig Dispense Refill  . Aspirin-Salicylamide-Caffeine (BC FAST PAIN RELIEF ARTHRITIS) 818-299-37 MG PACK Take 1 packet by mouth daily.    [name] BYSTOLIC 5 MG tablet take 1 tablet by mouth once daily 30 tablet 5  . clobetasol ointment (TEMOVATE) 1.69 % Apply 1 application topically daily as needed (for rash). 45 g 2  . diclofenac sodium (VOLTAREN) 1 % GEL Apply 1 application topically daily as needed. For pain 100 g 3  . hydrochlorothiazide (HYDRODIURIL) 25 MG tablet Take 1 tablet (25 mg total) by mouth daily. 90 tablet 3  . ketorolac (TORADOL) 10 MG tablet Take 1 tablet (10 mg total) by mouth every 6 (six) hours as needed. 20 tablet 0  . methylcellulose (ARTIFICIAL TEARS) 1 % ophthalmic solution Place 1 drop into both eyes daily as needed. Dry Eyes    . NUVARING 0.12-0.015 MG/24HR vaginal ring   0  . predniSONE (DELTASONE) 20 MG tablet 3 tabs poqday 1-2, 2 tabs poqday 3-4, 1 tab poqday 5-6 12 tablet 0   No current facility-administered medications on file prior to visit.    Allergies  Allergen Reactions  . Shrimp [Shellfish Allergy] Anaphylaxis, Hives and Swelling  . Other     Pt states "I carry an Epipen. I had anaphylactic reaction to something but I don't know what".    Social History   Social History  . Marital status: Married    Spouse name: N/A  . Number of children: N/A  . Years of education: N/A   Occupational History  . Not on file.   Social History Main Topics  . Smoking status: Former Smoker    Packs/day: 1.00    Years: 20.00    Types: Cigarettes    Quit date: 09/12/2016  . Smokeless tobacco: Never Used  . Alcohol use No  . Drug use: No  . Sexual activity: Yes    Birth control/ protection: Surgical   Other Topics Concern  . Not on file   Social History Narrative  . No narrative on file       Review of Systems  Musculoskeletal: Positive for back pain.  All other systems reviewed and are  negative.      Objective:   Physical Exam  Cardiovascular: Normal rate, regular rhythm and normal heart sounds.   Pulmonary/Chest: Effort normal and breath sounds normal. No respiratory distress. She has no wheezes. She has no rales.  Abdominal: Soft. Bowel sounds are normal.  Musculoskeletal:       Thoracic back: She exhibits tenderness and bony tenderness. She exhibits normal range of motion, no swelling, no deformity, no pain and no spasm.  Vitals reviewed.          Assessment & Plan:  Mid back pain on left side - Plan: DG Thoracic Spine W/Swimmers  I believe this is likely muscular and possibly related to her  fibromyalgia. Obtain a thoracic spine x-ray to rule out degenerative disc disease or arthritis. If the x-ray is relatively normal, I would focus on muscle relaxers and/or adding gabapentin versus Lyrica to manage her fibromyalgia pain

## 2017-02-18 ENCOUNTER — Encounter: Payer: Self-pay | Admitting: Family Medicine

## 2017-02-18 ENCOUNTER — Ambulatory Visit (INDEPENDENT_AMBULATORY_CARE_PROVIDER_SITE_OTHER): Payer: Medicare Other | Admitting: Family Medicine

## 2017-02-18 VITALS — BP 128/84 | HR 78 | Temp 97.8°F | Resp 16 | Wt 153.0 lb

## 2017-02-18 DIAGNOSIS — R42 Dizziness and giddiness: Secondary | ICD-10-CM

## 2017-02-18 DIAGNOSIS — M7989 Other specified soft tissue disorders: Secondary | ICD-10-CM | POA: Diagnosis not present

## 2017-02-18 NOTE — Progress Notes (Signed)
Subjective:    Patient ID: Theresa Washington, female    DOB: 06-24-1981, 36 y.o.   MRN: [id number]  Back Pain      09/16/16 Patient is a 36 year old white female with a history of fibromyalgia. She reports a three-week history of pain around her tailbone. Exam was performed with a chaperone present. On examination she is tender at the tip of the coccyx. However she is also tender approximately 1 inch above the tip of the coccyx near the S3 foramen. She states that is painful to sit in this area. She also states that she has significant pain in this area with sexual intercourse however it is position dependent. She denies any falls or injuries. She denies any saddle anesthesia. She denies any vaginal discharge or vaginal bleeding. She denies any fevers or chills.  At that time, my plan was:  Patient has coccydynia. Begin Toradol 10 mg every 6 hours for the next 4 days. Recommended a hemorrhoid pillow whenever she is sitting in a chair. Recommended avoiding positions that exacerbate pain. If no better in 2 weeks, proceed with x-rays of that area.  10/24/16 Patient's tailbone has stopped hurting. However since that time, she has been having burning pain getting near the sciatic notch in the posterior left gluteus radiating down her hamstring below her knee into her calf. The pain is neuropathic in nature. It is burning and stinging. She denies any numbness but she does report some tingling in her feet. An MRI obtained of the lumbar spine in 2015 did show a left shallow bulging disc between L5-S1 contacting the left S1 nerve root. It is likely that she has reaggravated this after her recent fall. The pain has been constantly worsening over the last 2 weeks.  At that time, my plan was: Begin prednisone taper pack. Obtain x-ray of lumbar spine given the fact this occurred after trauma. If symptoms are persistent and x-ray provides  no abnormality, proceed with an MRI of the lumbar spine possibly an epidural steroid injection  01/31/17 Ports more than a week of pain between her shoulder blades at approximately the level of T3. The pain is more to the left side. There is no tenderness to palpation in that area. She does report some mild spinous process tenderness to palpation. There is no visible deformity. She has full flexion and extension of her back. She is able to bend over and touch her toes. She does report some mild pleurisy. She denies any hemoptysis cough or chest pain. She denies any shortness of breath. The pleurisy that she reports more for muscle tenderness in her back when she takes deep breath in sounding more muscular in nature.  AT that time, my plan was: I believe this is likely muscular and possibly related to her fibromyalgia. Obtain a thoracic spine x-ray to rule out degenerative disc disease or arthritis. If the x-ray is relatively normal, I would focus on muscle relaxers and/or adding gabapentin versus Lyrica to manage her fibromyalgia pain  02/18/17 X-rays of her thoracic spine were completely normal. I recommended trying a muscle relaxer for 1 week. I have not heard back from the patient's I'll see her back is better. Today she presents complaining of swelling in both hands. This started over the weekend. Thursday  of last week, the patient was using a pressure washer. She states that ever since she was using the pressure washer, her hands up and swollen. However on examination, her fingers do not appear swollen to me. There is no significant pitting edema. There is no erythema. There is no bruising. She does take her ring off and is a slight indentation where her ring was but I believe this is more in keeping with typical changes we see that person is wearing a ring rather than pitting edema. She denies any swelling anywhere else on her body. Her weight is essentially the same as it was last time. She was 152 pounds  in March, she is under 53 pounds now. Ports vertigo. She states that over the weekend, the room was spinning. This seemed to be occurring at random times without provocation. Today on exam, she has a positive Dix-Hallpike maneuver to the left without visible nystagmus but symptomatically re-creating her symptoms. She denies any hearing loss. She denies any ringing in the ears. She denies any head trauma. She denies any neurologic deficit. Past Medical History:  Diagnosis Date  . Anemia   . Ankylosing spondylitis ([place])   . Anxiety   . Back pain   . Bell's palsy   . Contraceptive management 11/30/2015  . DDD (degenerative disc disease)   . DDD (degenerative disc disease)   . Degenerative disc disease   . Fibromyalgia   . Fibromyalgia   . History of kidney stones   . Hypertension   . 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[place])   . Myositis   . Palpitations   . PVC (premature ventricular contraction)   . Seizures ([place])    unknown etiolgy; no meds and no seizures for 2 years.  . Smoker 11/30/2015  . Spondylosis   . Von Willebrand disease ([place])    Past Surgical History:  Procedure Laterality Date  . DILATION AND CURETTAGE OF UTERUS  1998  . DILITATION & CURRETTAGE/HYSTROSCOPY WITH NOVASURE ABLATION N/A 01/16/2016   Procedure: HYSTEROSCOPY WITH NOVASURE ABLATION;  Surgeon: Jonnie Kind, MD;  Location: [facility];  Service: Gynecology;  Laterality: N/A;  Uterine Cavity Length 4.0 cm Uterine Cavity Width 3.3cm Power 73  Time 1 minute 17seconds  . LAPAROSCOPIC BILATERAL SALPINGECTOMY Bilateral 01/16/2016   Procedure: LAPAROSCOPIC BILATERAL SALPINGECTOMY;  Surgeon: Jonnie Kind, MD;  Location: [facility];  Service: Gynecology;  Laterality: Bilateral;  procedure 1  . OOPHORECTOMY  2006   Left side   Current Outpatient Prescriptions on File Prior to Visit  Medication Sig Dispense Refill  . Aspirin-Salicylamide-Caffeine (BC FAST PAIN RELIEF ARTHRITIS) [id number] MG PACK Take 1 packet by mouth daily.    [name] BYSTOLIC 5  MG tablet take 1 tablet by mouth once daily 30 tablet 5  . diclofenac sodium (VOLTAREN) 1 % GEL Apply 1 application topically daily as needed. For pain 100 g 3  . hydrochlorothiazide (HYDRODIURIL) 25 MG tablet Take 1 tablet (25 mg total) by mouth daily. 90 tablet 3  . methylcellulose (ARTIFICIAL TEARS) 1 % ophthalmic solution Place 1 drop into both eyes daily as needed. Dry Eyes     No current facility-administered medications on file prior to visit.    Allergies  Allergen Reactions  . Shrimp [Shellfish Allergy] Anaphylaxis, Hives and Swelling  . Other     Pt states "I carry an Epipen. I had anaphylactic reaction to something but I don't know what".    Social History   Social History  . Marital status: Married  Spouse name: N/A  . Number of children: N/A  . Years of education: N/A   Occupational History  . Not on file.   Social History Main Topics  . Smoking status: Former Smoker    Packs/day: 1.00    Years: 20.00    Types: Cigarettes    Quit date: 09/12/2016  . Smokeless tobacco: Never Used  . Alcohol use No  . Drug use: No  . Sexual activity: Yes    Birth control/ protection: Surgical   Other Topics Concern  . Not on file   Social History Narrative  . No narrative on file       Review of Systems  Musculoskeletal: Positive for back pain.  All other systems reviewed and are negative.      Objective:   Physical Exam  Constitutional: She is oriented to person, place, and time.  HENT:  Head: Atraumatic.  Right Ear: External ear normal.  Left Ear: External ear normal.  Nose: Nose normal.  Mouth/Throat: Oropharynx is clear and moist.  Cardiovascular: Normal rate, regular rhythm and normal heart sounds.   Pulmonary/Chest: Effort normal and breath sounds normal. No respiratory distress. She has no wheezes. She has no rales.  Abdominal: Soft. Bowel sounds are normal.  Musculoskeletal: She exhibits no edema.       Right hand: Normal. She exhibits normal range  of motion, no tenderness, no bony tenderness and no swelling.       Left hand: Normal. She exhibits normal range of motion, no tenderness, no bony tenderness and no swelling.  Neurological: She is alert and oriented to person, place, and time. She has normal reflexes. She displays normal reflexes. No cranial nerve deficit. Coordination normal.  Vitals reviewed.          Assessment & Plan:  Patient may be suffering from mild vertigo. However her symptoms are much better now. Therefore I recommended tincture of time. I see no reason to give the patient meclizine as he is essentially asymptomatic. Regarding the swelling in her hands, I do not appreciate any objective findings swelling. Therefore I would not change her dose of diuretic. There is no sign of fluid overload on exam. I recommended decreasing her salt intake. Is also possibly could be due using the pressure washer so much 3 or 4 days ago. I recommended putting ice on the hands 2-3 times a day to improve the subjective feeling of swelling and to allow tincture of time. I anticipate that somatic complaints will resolve spontaneously over the next week without any intervention

## 2017-02-20 ENCOUNTER — Telehealth: Payer: Self-pay | Admitting: Family Medicine

## 2017-02-20 NOTE — Telephone Encounter (Signed)
Pt calling and asking for prescription for Gabapentin.

## 2017-02-21 MED ORDER — GABAPENTIN 300 MG PO CAPS: 300.0000 mg | ORAL_CAPSULE | Freq: Three times a day (TID) | ORAL | 3 refills | Status: DC | PRN

## 2017-02-21 NOTE — Telephone Encounter (Signed)
Last I saw her was for hand swelling.  Why does she want to try gabapentin?  If for fibromyalgia, I am okay with trying 300 tid prn pain

## 2017-02-21 NOTE — Telephone Encounter (Signed)
Yes, is for her Fibromyalgia.  Rx to pharmacy and pt aware.

## 2017-03-01 IMAGING — MR MR HEAD W/O CM
9 of 11 series · 34 of 48 positions shown · non-contrast
Comparison: MRI head 09/07/2010.  CT head 01/24/2015

CLINICAL DATA: Right arm weakness

EXAM:
MRI HEAD WITHOUT CONTRAST
TECHNIQUE: Multiplanar, multiecho pulse sequences of the brain and surrounding
structures were obtained without intravenous contrast.

[Series 4: DWI · axial · 3.0mm · 0.94mm/px · z∈[-115,+26]mm · 8 of 96 slices shown (1 of 4)]
[im 1/96]
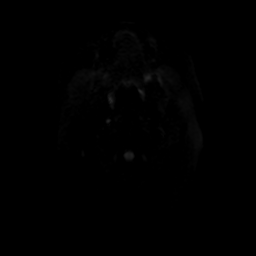
[im 14/96]
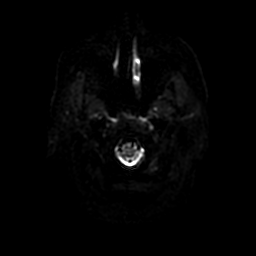
[im 28/96]
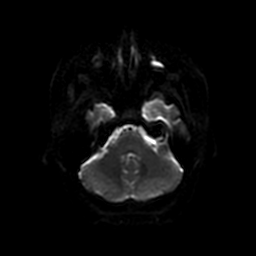
[im 41/96]
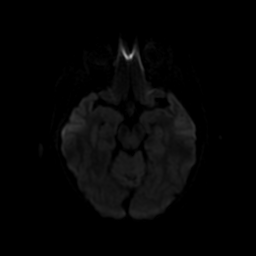
[im 55/96]
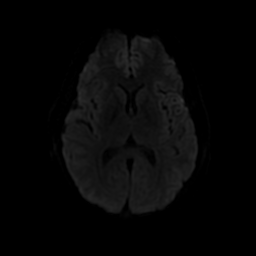
[im 68/96]
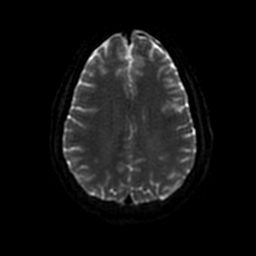
[im 82/96]
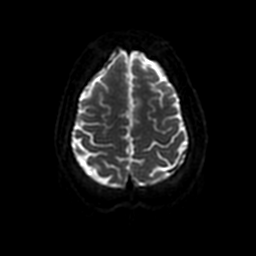
[im 96/96]
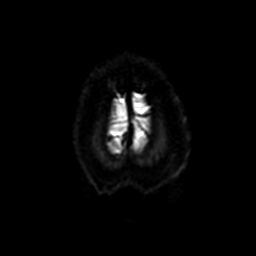

[Series 5: FLAIR · sagittal · 5.0mm · 0.47mm/px · 2 of 23 slices shown (1 of 2)]
[im 1/23]
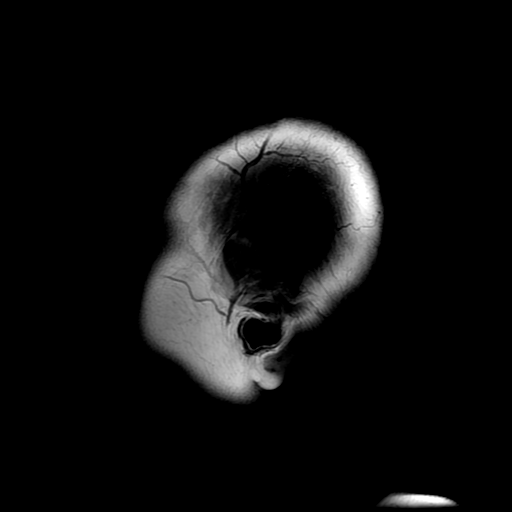
[im 23/23]
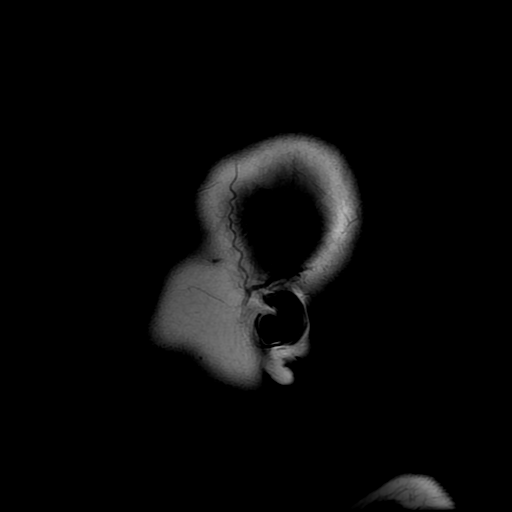

[Series 6: T2 · axial · 5.0mm · 0.47mm/px · z∈[-113,+24]mm · 2 of 24 slices shown (1 of 2)]
[im 1/24]
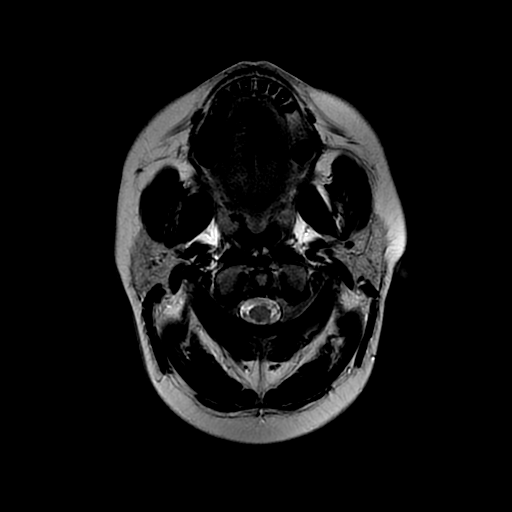
[im 24/24]
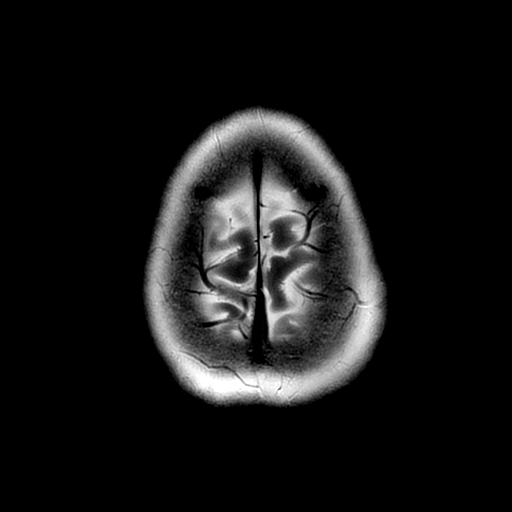

[Series 7: FLAIR · axial · 5.0mm · 0.47mm/px · z∈[-113,+24]mm · 2 of 24 slices shown (2 of 2)]
[im 1/24]
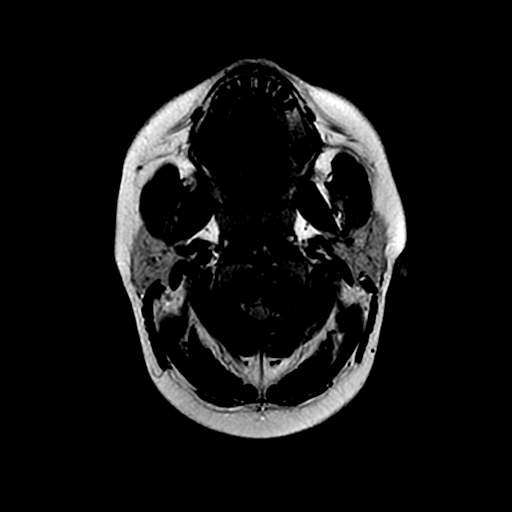
[im 24/24]
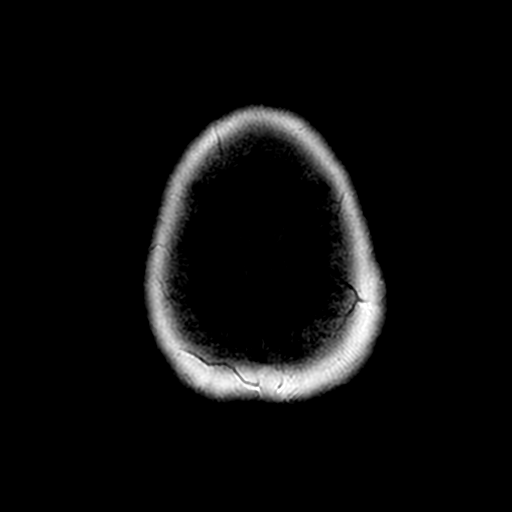

[Series 8: DWI · coronal · 5.0mm · 0.94mm/px · 6 of 66 slices shown (2 of 4)]
[im 1/66]
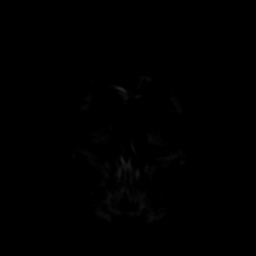
[im 14/66]
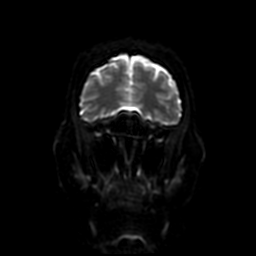
[im 27/66]
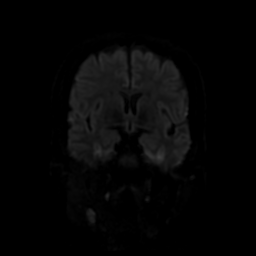
[im 40/66]
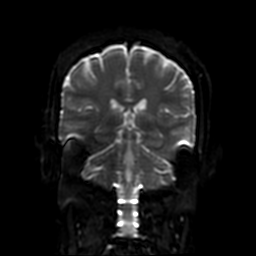
[im 53/66]
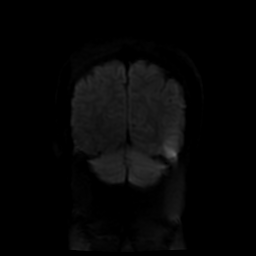
[im 66/66]
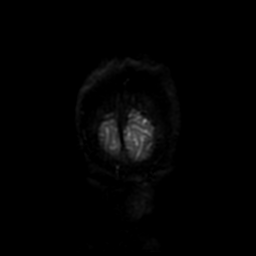

[Series 9: (person_name) · axial · 3.0mm · 0.47mm/px · z∈[-115,-63]mm · 4 of 96 slices shown]
[im 1/96]
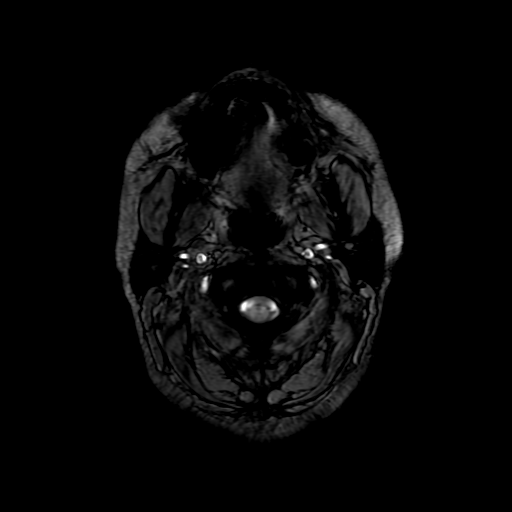
[im 12/96]
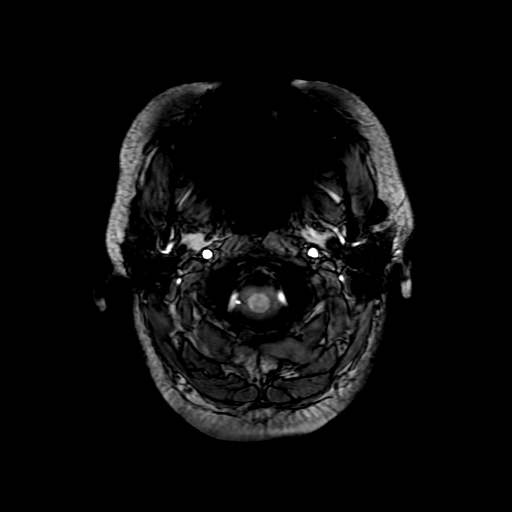
[im 24/96]
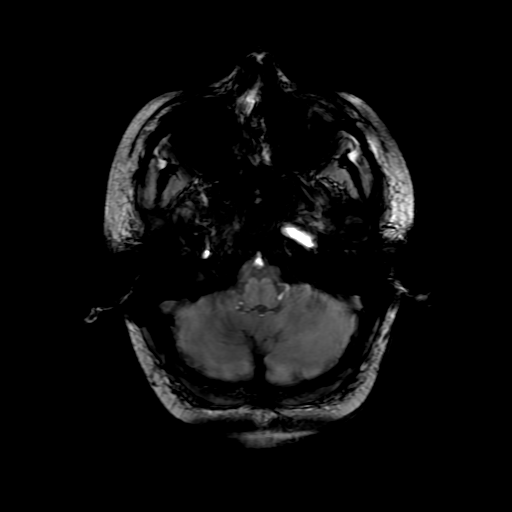
[im 36/96]
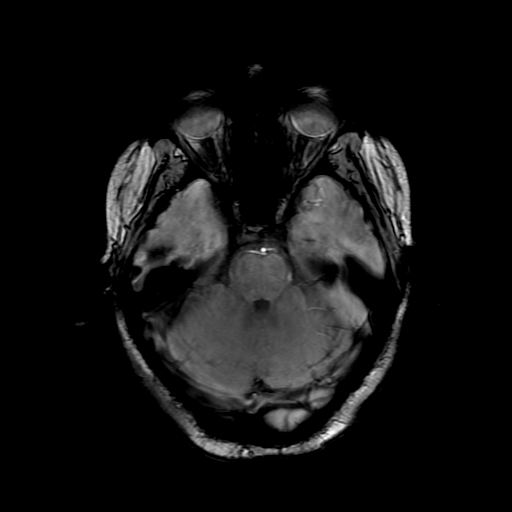

[Series 11: T2 · coronal · 5.0mm · 0.47mm/px · 3 of 28 slices shown (2 of 2)]
[im 1/28]
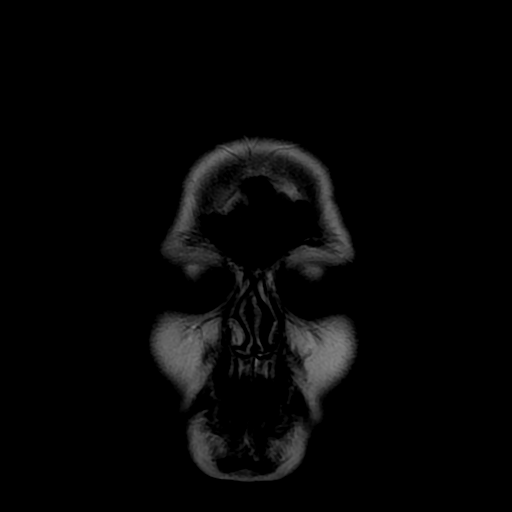
[im 14/28]
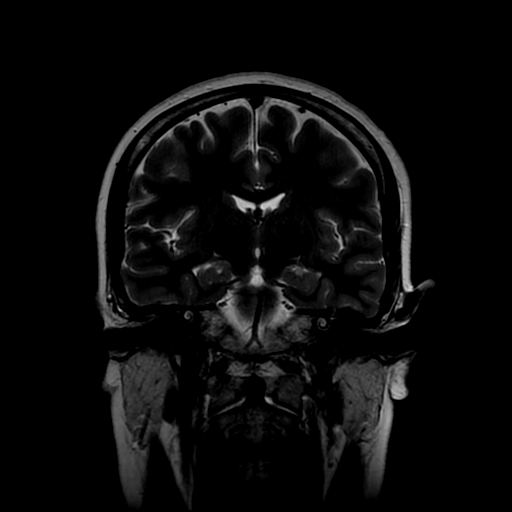
[im 28/28]
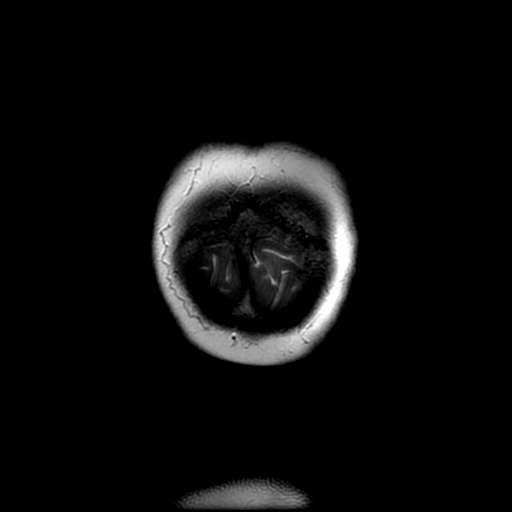

[Series 400: DWI · axial · 3.0mm · 0.94mm/px · z∈[-115,+26]mm · 4 of 48 slices shown (3 of 4)]
[im 1/48]
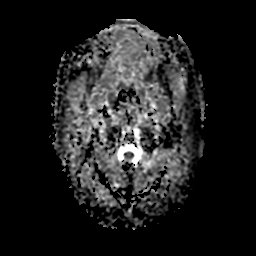
[im 16/48]
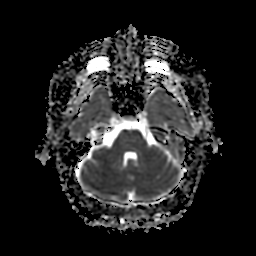
[im 32/48]
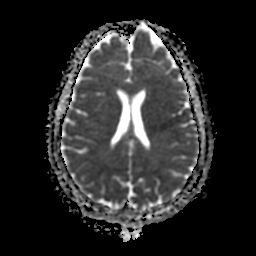
[im 48/48]
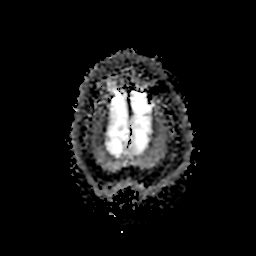

[Series 800: DWI · coronal · 5.0mm · 0.94mm/px · 3 of 33 slices shown (4 of 4)]
[im 1/33]
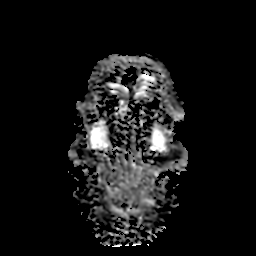
[im 17/33]
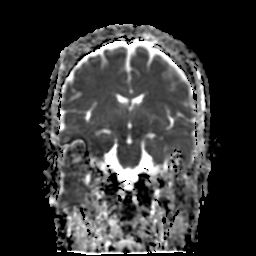
[im 33/33]
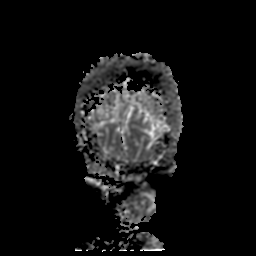

[34 of 48 positions shown; findings below may reference images not displayed]

FINDINGS: Ventricle size is normal. Cerebral volume is normal. Craniocervical
junction is normal. Pituitary is normal in size.

Negative for acute or chronic infarct

Negative for demyelinating disease.

Negative for hemorrhage or fluid collection

Negative for mass or edema.  No shift of the midline structures.

Paranasal sinuses clear.
IMPRESSION: Normal

## 2017-03-04 ENCOUNTER — Ambulatory Visit (INDEPENDENT_AMBULATORY_CARE_PROVIDER_SITE_OTHER): Payer: Medicare Other | Admitting: Family Medicine

## 2017-03-04 ENCOUNTER — Encounter: Payer: Self-pay | Admitting: Family Medicine

## 2017-03-04 VITALS — BP 146/82 | HR 74 | Temp 98.3°F | Resp 16 | Ht 62.0 in | Wt 152.0 lb

## 2017-03-04 DIAGNOSIS — M5412 Radiculopathy, cervical region: Secondary | ICD-10-CM | POA: Diagnosis not present

## 2017-03-04 MED ORDER — METHYLPREDNISOLONE ACETATE 40 MG/ML IJ SUSP
60.0000 mg | Freq: Once | INTRAMUSCULAR | Status: AC
  Administered 2017-03-04: 60 mg via INTRAMUSCULAR

## 2017-03-04 MED ORDER — PREDNISONE 20 MG PO TABS: ORAL_TABLET | ORAL | 0 refills | Status: DC

## 2017-03-04 NOTE — Progress Notes (Signed)
Subjective:    Patient ID: Theresa Washington, female    DOB: 1981/11/17, 36 y.o.   MRN: [id number]  Back Pain     Patient presents with a one-week history of pain in the left side of her neck radiating into the left posterior shoulder down the left arm past the left elbow. She also reports some numbness in her left hand. The pain is intense and can be severe as 10 over 10. She has a positive Spurling's maneuver today. Gentle pressure on her forehead elicits terrible pain in the left side of her neck and reproduces some of her symptoms. She does have some mild pain with abduction of the shoulder greater than 90 but she has a negative empty can sign and a negative Hawkins sign. She denies any chest pain or shortness of breath. She did have an MRI in 2016 that revealed mild disc desiccation at C3-C4 and C4-C5. She denies any falls or injuries to the neck. Past Medical History:  Diagnosis Date  . Anemia   . Ankylosing spondylitis ([place])   . Anxiety   . Back pain   . Bell's palsy   . Contraceptive management 11/30/2015  . DDD (degenerative disc disease)   . DDD (degenerative disc disease)   . Degenerative disc disease   . Fibromyalgia   . Fibromyalgia   . History of kidney stones   . Hypertension   . 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[place])   . Myositis   . Palpitations   . PVC (premature ventricular contraction)   . Seizures ([place])    unknown etiolgy; no meds and no seizures for 2 years.  . Smoker 11/30/2015  . Spondylosis   . Von Willebrand disease ([place])    Past Surgical History:  Procedure Laterality Date  . DILATION AND CURETTAGE OF UTERUS  1998  . DILITATION & CURRETTAGE/HYSTROSCOPY WITH NOVASURE ABLATION N/A 01/16/2016   Procedure: HYSTEROSCOPY WITH NOVASURE ABLATION;  Surgeon: Jonnie Kind, MD;  Location: [facility];  Service: Gynecology;  Laterality: N/A;  Uterine Cavity Length 4.0 cm Uterine Cavity Width 3.3cm Power 73  Time 1 minute 17seconds  . LAPAROSCOPIC BILATERAL SALPINGECTOMY Bilateral  01/16/2016   Procedure: LAPAROSCOPIC BILATERAL SALPINGECTOMY;  Surgeon: Jonnie Kind, MD;  Location: [facility];  Service: Gynecology;  Laterality: Bilateral;  procedure 1  . OOPHORECTOMY  2006   Left side   Current Outpatient Prescriptions on File Prior to Visit  Medication Sig Dispense Refill  . Aspirin-Salicylamide-Caffeine (BC FAST PAIN RELIEF ARTHRITIS) 867-619-50 MG PACK Take 1 packet by mouth daily.    [name] BYSTOLIC 5 MG tablet take 1 tablet by mouth once daily 30 tablet 5  . diclofenac sodium (VOLTAREN) 1 % GEL Apply 1 application topically daily as needed. For pain 100 g 3  . gabapentin (NEURONTIN) 300 MG capsule Take 1 capsule (300 mg total) by mouth 3 (three) times daily as needed. 90 capsule 3  . methylcellulose (ARTIFICIAL TEARS) 1 % ophthalmic solution Place 1 drop into both eyes daily as needed. Dry Eyes     No current facility-administered medications on file prior to visit.    Allergies  Allergen Reactions  . Shrimp [Shellfish Allergy] Anaphylaxis, Hives and Swelling  . Other     Pt states "I carry an Epipen. I had anaphylactic reaction to something but I don't know what".    Social History   Social History  . Marital status: Married    Spouse name: N/A  . Number of children: N/A  .  Years of education: N/A   Occupational History  . Not on file.   Social History Main Topics  . Smoking status: Former Smoker    Packs/day: 1.00    Years: 20.00    Types: Cigarettes    Quit date: 09/12/2016  . Smokeless tobacco: Never Used  . Alcohol use No  . Drug use: No  . Sexual activity: Yes    Birth control/ protection: Surgical   Other Topics Concern  . Not on file   Social History Narrative  . No narrative on file       Review of Systems  Musculoskeletal: Positive for back pain.  All other systems reviewed and are negative.      Objective:   Physical Exam  Neck: Muscular tenderness present. No neck rigidity.  Cardiovascular: Normal rate, regular rhythm and  normal heart sounds.   Pulmonary/Chest: Effort normal and breath sounds normal. No respiratory distress. She has no wheezes. She has no rales.  Abdominal: Soft. Bowel sounds are normal.  Musculoskeletal:       Cervical back: She exhibits decreased range of motion, tenderness and pain. She exhibits no bony tenderness.  Neurological: She displays normal reflexes. She exhibits normal muscle tone. Coordination normal.  Vitals reviewed.          Assessment & Plan:  Cervical radiculopathy - Plan: predniSONE (DELTASONE) 20 MG tablet  Her history is concerning for cervical radiculopathy. Treat the patient with Depo-Medrol 60 mg IM 1 and then a prednisone taper pack starting tomorrow. My only concern is that the patient has multiple repeat visits for diffuse widespread pain recently without any definite abnormality seen on imaging. Therefore I would also keep malingering vs somatization vs fibromyalgia in the differential diagnosis.

## 2017-03-04 NOTE — Addendum Note (Signed)
Addended by: Shary Decamp B on: 03/04/2017 04:10 PM   Modules accepted: Orders

## 2017-03-14 ENCOUNTER — Telehealth: Payer: Self-pay | Admitting: Family Medicine

## 2017-03-14 NOTE — Telephone Encounter (Signed)
Patient is calling to speak to you regarding a pinched nerve and some of her medication  [phone]

## 2017-03-19 ENCOUNTER — Telehealth: Payer: Self-pay | Admitting: Family Medicine

## 2017-03-19 DIAGNOSIS — M542 Cervicalgia: Secondary | ICD-10-CM

## 2017-03-19 NOTE — Telephone Encounter (Signed)
Pt called back and spoke to Manuela Schwartz and was inquiring about what to do next for her neck pain - note was forwarded to PCP.

## 2017-03-19 NOTE — Telephone Encounter (Signed)
Patient is calling to say to say her neck is not better and would like to know what she should do next [phone]

## 2017-03-20 NOTE — Telephone Encounter (Signed)
Physical therapy. 

## 2017-03-20 NOTE — Telephone Encounter (Signed)
[facility]

## 2017-03-21 NOTE — Telephone Encounter (Signed)
Pt aware and referral placed.  

## 2017-04-02 ENCOUNTER — Ambulatory Visit (HOSPITAL_COMMUNITY): Payer: Medicare Other | Attending: Family Medicine

## 2017-04-02 DIAGNOSIS — R29898 Other symptoms and signs involving the musculoskeletal system: Secondary | ICD-10-CM | POA: Insufficient documentation

## 2017-04-02 DIAGNOSIS — M6281 Muscle weakness (generalized): Secondary | ICD-10-CM | POA: Insufficient documentation

## 2017-04-02 DIAGNOSIS — M542 Cervicalgia: Secondary | ICD-10-CM | POA: Insufficient documentation

## 2017-04-02 NOTE — Therapy (Signed)
[place], [place], [address] Phone: [phone]   Fax:  [phone]  Physical Therapy Evaluation  Patient Details  Name: Theresa Washington MRN: [id number] Date of Birth: [birth year] Referring Provider: Jenna Luo, MD  Encounter Date: 04/02/2017      PT End of Session - 04/02/17 1129    Visit Number 1   Number of Visits 12   Date for PT Re-Evaluation 04/23/17   Authorization Type [employer]   Authorization Time Period 04/02/17 to 05/14/17   Authorization - Visit Number 1   Authorization - Number of Visits 10   PT Start Time 2542   PT Stop Time 1118   PT Time Calculation (min) 40 min   Activity Tolerance Patient tolerated treatment well   Behavior During Therapy [facility] for tasks assessed/performed      Past Medical History:  Diagnosis Date  . Anemia   . Ankylosing spondylitis ([place])   . Anxiety   . Back pain   . Bell's palsy   . Contraceptive management 11/30/2015  . DDD (degenerative disc disease)   . DDD (degenerative disc disease)   . Degenerative disc disease   . Fibromyalgia   . Fibromyalgia   . History of kidney stones   . Hypertension   . 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 Myositis   . Palpitations   . PVC (premature ventricular contraction)   . Seizures ([place])    unknown etiolgy; no meds and no seizures for 2 years.  . Smoker 11/30/2015  . Spondylosis   . Von Willebrand disease ([place])     Past Surgical History:  Procedure Laterality Date  . DILATION AND CURETTAGE OF UTERUS  1998  . DILITATION & CURRETTAGE/HYSTROSCOPY WITH NOVASURE ABLATION N/A 01/16/2016   Procedure: HYSTEROSCOPY WITH NOVASURE ABLATION;  Surgeon: Jonnie Kind, MD;  Location: [facility];  Service: Gynecology;  Laterality: N/A;  Uterine Cavity Length 4.0 cm Uterine Cavity Width 3.3cm Power 73  Time 1 minute 17seconds  . LAPAROSCOPIC BILATERAL SALPINGECTOMY Bilateral 01/16/2016   Procedure: LAPAROSCOPIC BILATERAL SALPINGECTOMY;   Surgeon: Jonnie Kind, MD;  Location: [facility];  Service: Gynecology;  Laterality: Bilateral;  procedure 1  . OOPHORECTOMY  2006   Left side    There were no vitals filed for this visit.       Subjective Assessment - 04/02/17 1040    Subjective Pt states that 5 years ago, her neck pain started. She states that one day, she just started having difficulty gripping and picking up items. She states that she has DDD and progressively her neck pain has just gotten worse. Her pain is located on the L side of her neck which radiates down her L arm and under her L shoulder blade. She has n/t in her L hand which has been constant over the last 5 years but it has gotten progressively worse over the last 6 weeks. She has difficulty holding onto items such as coffee pots, phones, etc. due to the numbness as well as increased weakness. She has intermittent R hand numbness. She has had therapy 3x for this same issue but she did not notice a change. She reports having really bad headaches and it feels like the pain runs up her neck. She has issues with sleeping and driving due to the pain and has to use her R hand to drive. She is having a lot of difficulty with neck ROM and household chores.   Pertinent History  fibromyalgia, ankylosing spondylitis, HTN, lyme arthritis, LBP, anxiety   Limitations Lifting;House hold activities;Reading   How long can you sit comfortably? no issues due to the neck   How long can you stand comfortably? 45 mins   How long can you walk comfortably? no issues with the neck but sometimes limited due to LBP   Patient Stated Goals decrease pain, be able to hold items in hand without dropping them   Currently in Pain? Yes   Pain Score 9    Pain Location Neck   Pain Orientation Left   Pain Descriptors / Indicators Sharp   Pain Type Chronic pain   Pain Onset More than a month ago   Pain Frequency Constant   Aggravating Factors  moving neck, lifting, pushing, pulling items, movign  shoulders   Pain Relieving Factors cold packs, heat, hot showers, gabapentin, ibuprofen, prednisone (not currently on prednisone at the moment)   Effect of Pain on Daily Activities severe impact            [facility] PT Assessment - 04/02/17 0001      Assessment   Medical Diagnosis neck pain   Referring Provider Jenna Luo, MD   Onset Date/Surgical Date 04/02/12   Hand Dominance Right   Next MD Visit follow as needed   Prior Therapy yes for current issue     Precautions   Precautions None     Balance Screen   Has the patient fallen in the past 6 months No   Has the patient had a decrease in activity level because of a fear of falling?  No   Is the patient reluctant to leave their home because of a fear of falling?  No     Prior Function   Level of Independence Independent;Independent with basic ADLs   Vocation On disability     Sensation   Light Touch Impaired Detail   Light Touch Impaired Details Impaired LUE   Additional Comments C6-T1 less on the LUE     Posture/Postural Control   Posture/Postural Control Postural limitations   Postural Limitations Rounded Shoulders;Forward head;Increased thoracic kyphosis     ROM / Strength   AROM / PROM / Strength AROM;Strength     AROM   Overall AROM Comments BUE WFL, painful throughout flexion and abd; Neck AROM painful throughout especially extension, R LF, and R rot   Cervical Flexion 51   Cervical Extension 50   Cervical - Right Side Bend 30   Cervical - Left Side Bend 35   Cervical - Right Rotation 90   Cervical - Left Rotation 65     Strength   Right Shoulder Flexion 4+/5   Right Shoulder ABduction 4+/5   Right Shoulder Internal Rotation 5/5   Right Shoulder External Rotation 4/5   Left Shoulder Flexion 4/5   Left Shoulder ABduction 4+/5   Left Shoulder Internal Rotation 5/5   Left Shoulder External Rotation 4/5   Right Elbow Flexion 5/5   Right Elbow Extension 5/5   Left Elbow Flexion 5/5   Left Elbow  Extension 5/5   Right Wrist Flexion 5/5   Right Wrist Extension 5/5   Left Wrist Flexion 5/5   Left Wrist Extension 5/5   Right Hand Gross Grasp Functional   Right Hand Grip (lbs) 64   Left Hand Gross Grasp Impaired   Left Hand Grip (lbs) 43     Palpation   Palpation comment increased soft tissue restrictions of UT, rhomboids, thoracic and cervical paraspinals  thorughout L side     Special Tests    Special Tests Cervical   Cervical Tests Spurling's;Dictraction;other     Spurling's   Comment + on r, - on L     Distraction Test   Findngs Positive     other    Findings Positive   Comment Left median and radial nerve glide testing each +, Radial > median              PT Education - 04/02/17 1127    Education provided Yes   Education Details exam findings, POC, HEP, sleep with towel roll in pillow case and with pillows under legs    Person(s) Educated Patient   Methods Explanation;Demonstration   Comprehension Verbalized understanding          PT Short Term Goals - 04/02/17 1152      PT SHORT TERM GOAL #1   Title Pt will be independent with HEP and perform consistently to maximize overall function and return to PLOF.   Time 3   Period Weeks   Status New     PT SHORT TERM GOAL #2   Title Pt will have improved cervical AROM by 10 deg or more in all directions to decrease pain and maximize her driving.   Time 3   Period Weeks   Status New     PT SHORT TERM GOAL #3   Title Pt will have improved L grip strength to be at least 80% of R grip strength (51#) to demonstrate improved overall function and maximize ability to hold objects without dropping them.   Time 3   Period Weeks   Status New           PT Long Term Goals - 04/02/17 1156      PT LONG TERM GOAL #1   Title Pt will report being able to sleep for at least 5 hours each night with 3 or less awakenings due to pain in order to maximize recovery.    Time 6   Period Weeks   Status New     PT  LONG TERM GOAL #2   Title Pt will report being able to perform heavy household chores for at least 1 hour with 2 or < rest breaks due to neck pain in order to demonstrate improved overall function.   Time 6   Period Weeks   Status New     PT LONG TERM GOAL #3   Title Pt will report reduced overall pain to at least 4/10 at worst to demonstrate improved overall function and maximize QoL as well as participation at home and in the community.   Time 6   Period Weeks   Status New     PT LONG TERM GOAL #4   Title Pt will have improved overall BUE strength to 5/5 with minimal to no neck pain in order to maximize function at home and in the community.   Time 6   Period Weeks   Status New               Plan - 04/02/17 1131    Clinical Impression Statement Pt is pleasant 36 YO F who presents to OPPT with c/o chronic neck pain with radicular symptoms down LUE. Pt has deficits in cervical ROM, BUE strength, grip strength, increased soft tissue restrictions throughout periscapular musculature and thoracic and cervical paraspinals. Pt with pain on L side of neck but has positive Spurling's on the R. Median and  Radial nerve glide tests + on L, radial>median. Pt educated on central sensitization and that sessions will focus on improving her tolerance to movement/positions, as well as improve soft tissue restrictions along with improving postural strength. Pt needs skilled PT intervention to maximize overall function and QoL.   Rehab Potential Fair   PT Frequency 2x / week   PT Duration 6 weeks   PT Treatment/Interventions ADLs/Self Care Home Management;Cryotherapy;Electrical Stimulation;Moist Heat;Traction;Therapeutic activities;Therapeutic exercise;Neuromuscular re-education;Patient/family education;Manual techniques;Passive range of motion;Dry needling;Energy conservation;Taping   PT Next Visit Plan review goals, decompression exercises 1-5 and give as HEP, see how sleeping with towel roll and  pillows under legs went, manual to paraspinals and periscapular musculature, median and radial nerve glides/flossing, cervical retractions, postural education and strengthening   PT Home Exercise Plan eval: sleep with towel roll in pillow case and with pillows under legs, soft yellow putty for L grip strength    Consulted and Agree with Plan of Care Patient      Patient will benefit from skilled therapeutic intervention in order to improve the following deficits and impairments:  Decreased activity tolerance, Decreased range of motion, Decreased strength, Increased muscle spasms, Impaired sensation, Impaired UE functional use, Improper body mechanics, Postural dysfunction, Pain  Visit Diagnosis: Cervicalgia - Plan: PT plan of care cert/re-cert  Muscle weakness (generalized) - Plan: PT plan of care cert/re-cert  Other symptoms and signs involving the musculoskeletal system - Plan: PT plan of care cert/re-cert      G-Codes - 25/42/70 1201-03-06    Functional Assessment Tool Used (Outpatient Only) clinical judgement, ROM, MMT   Functional Limitation Carrying, moving and handling objects   Carrying, Moving and Handling Objects Current Status (W2376) At least 60 percent but less than 80 percent impaired, limited or restricted   Carrying, Moving and Handling Objects Goal Status (E8315) At least 20 percent but less than 40 percent impaired, limited or restricted       Problem List Patient Active Problem List   Diagnosis Date Noted  . Encounter for female sterilization procedure bilateral salpingectomy 01/16/2016  . Encounter for menstrual regulation 01/16/2016  . Consultation for sterilization 01/10/2016  . Consultation for female sterilization 01/01/2016  . Contraceptive management 11/30/2015  . Smoker 11/30/2015  . Palpitations 04/27/2014  . Pre-syncope 04/26/2014  . Near syncope 04/26/2014  . Chest pain 04/26/2014  . Hip pain 12/12/2013  . Acute sinusitis 10/15/2013  . Fibromyalgia  02/10/2013  . Somatization disorder 02/10/2013  . Pseudoseizures 02/10/2013  . DDD (degenerative disc disease), lumbosacral 02/10/2013  . Von Willebrand disease ([place]) 02/10/2013  . SMOKER 05/20/2010  . SORE THROAT 05/20/2010  . ACUTE BRONCHITIS 05/20/2010  . CHRONIC OBSTRUCTIVE PULMONARY DISEASE 05/20/2010  . FOREIGN BODY, EAR, RIGHT 05/20/2010  . SHOULDER PAIN 10/27/2008  . NECK PAIN 10/27/2008     Geraldine Solar PT, DPT   [facility] [address] [place], [place], [address] Phone: [phone]   Fax:  [phone]  Name: Theresa Washington MRN: [id number] Date of Birth: 08/22/1981

## 2017-04-04 ENCOUNTER — Ambulatory Visit (HOSPITAL_COMMUNITY): Payer: Medicare Other | Admitting: Physical Therapy

## 2017-04-04 ENCOUNTER — Telehealth (HOSPITAL_COMMUNITY): Payer: Self-pay | Admitting: Physical Therapy

## 2017-04-04 NOTE — Telephone Encounter (Signed)
Patient left a message to cancel her appt for today.

## 2017-04-07 ENCOUNTER — Telehealth (HOSPITAL_COMMUNITY): Payer: Self-pay

## 2017-04-07 ENCOUNTER — Ambulatory Visit (HOSPITAL_COMMUNITY): Payer: Medicare Other

## 2017-04-07 ENCOUNTER — Encounter (HOSPITAL_COMMUNITY): Payer: Self-pay

## 2017-04-07 NOTE — Telephone Encounter (Signed)
No show #1; left voicemail regarding missed appointment. Reminded pt that her next appointment is Wednesday at 9:45 and to call the front office if she can't make it.   Geraldine Solar PT, DPT

## 2017-04-07 NOTE — Therapy (Signed)
[place], [place], [address] Phone: [phone]   Fax:  [phone]  Patient Details  Name: Theresa Washington MRN: [id number] Date of Birth: May 18, 1981 Referring Provider:  No ref. provider found  Encounter Date: 04/07/2017   Pt called back following voicemail regarding missed appointment and she stated that due to financial reasons, she would not be able to continue therapy.  PHYSICAL THERAPY DISCHARGE SUMMARY  Visits from Start of Care: 1  Current functional level related to goals / functional outcomes: Pt only attended initial evaluation so she has not met any of the goals that were established.   Remaining deficits: See initial evaluation   Education / Equipment: n/a Plan: Patient agrees to discharge.  Patient goals were not met. Patient is being discharged due to financial reasons.  ?????      Geraldine Solar PT, [place] [address] [place], [place], [address] Phone: [phone]   Fax:  [phone]

## 2017-04-09 ENCOUNTER — Encounter (HOSPITAL_COMMUNITY): Payer: Medicare Other

## 2017-04-16 ENCOUNTER — Ambulatory Visit (HOSPITAL_COMMUNITY): Payer: Medicare Other

## 2017-04-18 ENCOUNTER — Encounter (HOSPITAL_COMMUNITY): Payer: Medicare Other

## 2017-04-21 ENCOUNTER — Encounter (HOSPITAL_COMMUNITY): Payer: Medicare Other

## 2017-04-23 ENCOUNTER — Encounter (HOSPITAL_COMMUNITY): Payer: Medicare Other

## 2017-04-28 ENCOUNTER — Encounter (HOSPITAL_COMMUNITY): Payer: Medicare Other

## 2017-04-30 ENCOUNTER — Encounter (HOSPITAL_COMMUNITY): Payer: Medicare Other

## 2017-05-05 ENCOUNTER — Encounter (HOSPITAL_COMMUNITY): Payer: Medicare Other

## 2017-05-07 ENCOUNTER — Encounter (HOSPITAL_COMMUNITY): Payer: Medicare Other

## 2017-05-08 ENCOUNTER — Encounter: Payer: Self-pay | Admitting: Family Medicine

## 2017-05-08 ENCOUNTER — Ambulatory Visit (INDEPENDENT_AMBULATORY_CARE_PROVIDER_SITE_OTHER): Payer: Medicare Other | Admitting: Family Medicine

## 2017-05-08 VITALS — BP 126/82 | HR 72 | Temp 98.0°F | Resp 16 | Ht 62.0 in | Wt 151.0 lb

## 2017-05-08 DIAGNOSIS — M5432 Sciatica, left side: Secondary | ICD-10-CM | POA: Diagnosis not present

## 2017-05-08 DIAGNOSIS — M5412 Radiculopathy, cervical region: Secondary | ICD-10-CM

## 2017-05-08 MED ORDER — PREDNISONE 20 MG PO TABS: ORAL_TABLET | ORAL | 0 refills | Status: DC

## 2017-05-08 MED ORDER — HYDROCODONE-ACETAMINOPHEN 7.5-325 MG PO TABS: 1.0000 | ORAL_TABLET | Freq: Four times a day (QID) | ORAL | 0 refills | Status: DC | PRN

## 2017-05-08 NOTE — Progress Notes (Signed)
Subjective:    Patient ID: Theresa Washington, female    DOB: Sep 17, 1981, 36 y.o.   MRN: [id number]  Back Pain      09/16/16 Patient is a 36 year old white female with a history of fibromyalgia. She reports a three-week history of pain around her tailbone. Exam was performed with a chaperone present. On examination she is tender at the tip of the coccyx. However she is also tender approximately 1 inch above the tip of the coccyx near the S3 foramen. She states that is painful to sit in this area. She also states that she has significant pain in this area with sexual intercourse however it is position dependent. She denies any falls or injuries. She denies any saddle anesthesia. She denies any vaginal discharge or vaginal bleeding. She denies any fevers or chills.  At that time, my plan was:  Patient has coccydynia. Begin Toradol 10 mg every 6 hours for the next 4 days. Recommended a hemorrhoid pillow whenever she is sitting in a chair. Recommended avoiding positions that exacerbate pain. If no better in 2 weeks, proceed with x-rays of that area.  10/24/16 Patient's tailbone has stopped hurting. However since that time, she has been having burning pain getting near the sciatic notch in the posterior left gluteus radiating down her hamstring below her knee into her calf. The pain is neuropathic in nature. It is burning and stinging. She denies any numbness but she does report some tingling in her feet. An MRI obtained of the lumbar spine in 2015 did show a left shallow bulging disc between L5-S1 contacting the left S1 nerve root. It is likely that she has reaggravated this after her recent fall. The pain has been constantly worsening over the last 2 weeks.  At that time, my plan was: Begin prednisone taper pack. Obtain x-ray of lumbar spine given the fact this occurred after trauma. If symptoms are persistent and x-ray provides no abnormality, proceed with an MRI of the lumbar spine possibly an  epidural steroid injection  05/08/17 The pain in the patient's back has returned. She reports pain at the level of L5 radiating into her left posterior hip down her left leg into her left foot. She also reports paresthesias in the leg as well. Past Medical History:  Diagnosis Date  . Anemia   . Ankylosing spondylitis ([place])   . Anxiety   . Back pain   . Bell's palsy   . Contraceptive management 11/30/2015  . DDD (degenerative disc disease)   . DDD (degenerative disc disease)   . Degenerative disc disease   . Fibromyalgia   . Fibromyalgia   . History of kidney stones   . Hypertension   . 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 Myositis   . Palpitations   . PVC (premature ventricular contraction)   . Seizures ([place])    unknown etiolgy; no meds and no seizures for 2 years.  . Smoker 11/30/2015  . Spondylosis   . Von Willebrand disease ([place])    Past Surgical History:  Procedure Laterality Date  . DILATION AND CURETTAGE OF UTERUS  1998  . DILITATION & CURRETTAGE/HYSTROSCOPY WITH NOVASURE ABLATION N/A 01/16/2016   Procedure: HYSTEROSCOPY WITH NOVASURE ABLATION;  Surgeon: Jonnie Kind, MD;  Location: [facility];  Service: Gynecology;  Laterality: N/A;  Uterine Cavity Length 4.0 cm Uterine Cavity Width 3.3cm Power 73  Time 1 minute 17seconds  . LAPAROSCOPIC BILATERAL SALPINGECTOMY Bilateral 01/16/2016   Procedure: LAPAROSCOPIC BILATERAL SALPINGECTOMY;  Surgeon: Jenny Reichmann  France Ravens, MD;  Location: [facility];  Service: Gynecology;  Laterality: Bilateral;  procedure 1  . OOPHORECTOMY  2006   Left side   Current Outpatient Prescriptions on File Prior to Visit  Medication Sig Dispense Refill  . Aspirin-Salicylamide-Caffeine (BC FAST PAIN RELIEF ARTHRITIS) [phone] MG PACK Take 1 packet by mouth daily.    [name] BYSTOLIC 5 MG tablet take 1 tablet by mouth once daily 30 tablet 5  . diclofenac sodium (VOLTAREN) 1 % GEL Apply 1 application topically daily as needed. For pain 100 g 3  . gabapentin (NEURONTIN) 300 MG  capsule Take 1 capsule (300 mg total) by mouth 3 (three) times daily as needed. 90 capsule 3  . methylcellulose (ARTIFICIAL TEARS) 1 % ophthalmic solution Place 1 drop into both eyes daily as needed. Dry Eyes     No current facility-administered medications on file prior to visit.    Allergies  Allergen Reactions  . Shrimp [Shellfish Allergy] Anaphylaxis, Hives and Swelling  . Other     Pt states "I carry an Epipen. I had anaphylactic reaction to something but I don't know what".    Social History   Social History  . Marital status: Married    Spouse name: N/A  . Number of children: N/A  . Years of education: N/A   Occupational History  . Not on file.   Social History Main Topics  . Smoking status: Former Smoker    Packs/day: 1.00    Years: 20.00    Types: Cigarettes    Quit date: 09/12/2016  . Smokeless tobacco: Never Used  . Alcohol use No  . Drug use: No  . Sexual activity: Yes    Birth control/ protection: Surgical   Other Topics Concern  . Not on file   Social History Narrative  . No narrative on file       Review of Systems  Musculoskeletal: Positive for back pain.  All other systems reviewed and are negative.      Objective:   Physical Exam  Cardiovascular: Normal rate, regular rhythm and normal heart sounds.   Pulmonary/Chest: Effort normal and breath sounds normal. No respiratory distress. She has no wheezes. She has no rales.  Abdominal: Soft. Bowel sounds are normal.  Musculoskeletal:       Lumbar back: She exhibits bony tenderness. She exhibits normal range of motion and no tenderness.       Back:  Vitals reviewed.  Negative straight leg raise. Muscle strength is 5 over 5 equal and symmetric in both legs. She has normal 2 over 4 reflexes at the knee and Achilles bilaterally        Assessment & Plan:  Left sided sciatica - Plan: predniSONE (DELTASONE) 20 MG tablet, HYDROcodone-acetaminophen (NORCO) 7.5-325 MG tablet  Cervical  radiculopathy  Patient was started on a prednisone taper pack and was given Norco 7.5/325 every 6 hrs prn pain (30).  Reassess in 1 week and if no better consult interventional radiology  for possible ESI.

## 2017-10-17 ENCOUNTER — Ambulatory Visit: Payer: Medicare Other | Admitting: Family Medicine

## 2017-10-17 ENCOUNTER — Encounter: Payer: Self-pay | Admitting: Family Medicine

## 2017-10-17 ENCOUNTER — Other Ambulatory Visit: Payer: Self-pay

## 2017-10-17 VITALS — BP 112/78 | HR 78 | Temp 98.3°F | Resp 16 | Ht 62.0 in | Wt 153.0 lb

## 2017-10-17 DIAGNOSIS — M5137 Other intervertebral disc degeneration, lumbosacral region: Secondary | ICD-10-CM | POA: Diagnosis not present

## 2017-10-17 DIAGNOSIS — M797 Fibromyalgia: Secondary | ICD-10-CM | POA: Diagnosis not present

## 2017-10-17 DIAGNOSIS — Z23 Encounter for immunization: Secondary | ICD-10-CM

## 2017-10-17 MED ORDER — TRAMADOL HCL 50 MG PO TABS: 50.0000 mg | ORAL_TABLET | Freq: Three times a day (TID) | ORAL | 0 refills | Status: DC | PRN

## 2017-10-17 MED ORDER — CELECOXIB 100 MG PO CAPS
100.0000 mg | ORAL_CAPSULE | Freq: Two times a day (BID) | ORAL | 2 refills | Status: DC
Start: 2017-10-17 — End: 2018-01-21

## 2017-10-17 NOTE — Patient Instructions (Signed)
F/U 4 weeks with Dr. Dennard Schaumann  Flu shot given

## 2017-10-17 NOTE — Progress Notes (Signed)
   Subjective:    Patient ID: Theresa Washington, female    DOB: 1980/11/19, 36 y.o.   MRN: [id number]  Patient presents for Chronic Pain (states that she has fibromyalgia and was prescribed Gabapentin- reports that medication is no longer effective and would like to discuss options)  OA- has tried Mobic, Ibuprofen, Naprosyn, diclofenac oral did not work, uses voltaren gel which helps some  Has hydrocodone uses sparingly   Has been using BC arthiritis  Has tried   Fibromyalgia- has tried gabapentin, lycira, Savella  And cymbalta- was [facility] RA specialiast [facility] 4 years ago or so when she was diagnosed   Also has Ankolysizing spondylosis- but was told nothing else they could give due to her other medical pronlems   Has done PT for neck and Low back  Has been to pain clinic in past ( [facility] s pain management)   Review Of Systems:  GEN- denies fatigue, fever, weight loss,weakness, recent illness HEENT- denies eye drainage, change in vision, nasal discharge, CVS- denies chest pain, palpitations RESP- denies SOB, cough, wheeze ABD- denies N/V, change in stools, abd pain GU- denies dysuria, hematuria, dribbling, incontinence MSK- denies joint pain, muscle aches, injury Neuro- denies headache, dizziness, syncope, seizure activity       Objective:    BP 112/78   Pulse 78   Temp 98.3 F (36.8 C) (Oral)   Resp 16   Ht 5\' 2"  (1.575 m)   Wt 153 lb (69.4 kg)   SpO2 99%   BMI 27.98 kg/m  GEN- NAD, alert and oriented x3 HEENT- PERRL, EOMI, non injected sclera, pink conjunctiva, MMM, oropharynx clear CVS- RRR, no murmur RESP-CTAB MSK- TTP multiple trigger points on back. No effusions of knees, ankles, elbows, good ROM EXT- No edema Pulses- Radial 2+        Assessment & Plan:      Problem List Items Addressed This Visit      Unprioritized   DDD (degenerative disc disease), lumbosacral   Relevant Medications   celecoxib (CELEBREX) 100 MG capsule   traMADol (ULTRAM) 50  MG tablet   Fibromyalgia - Primary    Chronic pain syndrome in setting of some known DDD as well. Ankylosis spondylosis noted in chart as well. She has been on multiple medications from narcotic, to arthritis/NSAIDS, to neuropathic medications. She will need to f/u with her PCP who will know more of her underlying history and how to pursue this further with her pain. Has seen pain management and Rheumatology already Will start her on celebrex 100mg  BID along with tramadol up to 3 times a day. She does have some percocer at home, but I did not refill this. F/U PCP in 4 weeks        Other Visit Diagnoses    Need for immunization against influenza       Relevant Orders   Flu Vaccine QUAD 36+ mos IM (Completed)      Note: This dictation was prepared with Dragon dictation along with smaller phrase technology. Any transcriptional errors that result from this process are unintentional.

## 2017-10-19 ENCOUNTER — Encounter: Payer: Self-pay | Admitting: Family Medicine

## 2017-10-19 NOTE — Assessment & Plan Note (Signed)
Chronic pain syndrome in setting of some known DDD as well. Ankylosis spondylosis noted in chart as well. She has been on multiple medications from narcotic, to arthritis/NSAIDS, to neuropathic medications. She will need to f/u with her PCP who will know more of her underlying history and how to pursue this further with her pain. Has seen pain management and Rheumatology already Will start her on celebrex 100mg  BID along with tramadol up to 3 times a day. She does have some percocer at home, but I did not refill this. F/U PCP in 4 weeks

## 2017-11-14 ENCOUNTER — Ambulatory Visit (INDEPENDENT_AMBULATORY_CARE_PROVIDER_SITE_OTHER): Payer: Medicare Other | Admitting: Family Medicine

## 2017-11-14 ENCOUNTER — Encounter: Payer: Self-pay | Admitting: Family Medicine

## 2017-11-14 VITALS — BP 140/86 | HR 96 | Temp 98.7°F | Resp 16 | Ht 62.0 in | Wt 151.0 lb

## 2017-11-14 DIAGNOSIS — M797 Fibromyalgia: Secondary | ICD-10-CM | POA: Diagnosis not present

## 2017-11-14 DIAGNOSIS — R3589 Other polyuria: Secondary | ICD-10-CM

## 2017-11-14 DIAGNOSIS — R358 Other polyuria: Secondary | ICD-10-CM

## 2017-11-14 LAB — BASIC METABOLIC PANEL WITH GFR
BUN: 10 mg/dL (ref 7–25)
CALCIUM: 9.1 mg/dL (ref 8.6–10.2)
CO2: 24 mmol/L (ref 20–32)
CREATININE: 0.75 mg/dL (ref 0.50–1.10)
Chloride: 104 mmol/L (ref 98–110)
GFR, EST AFRICAN AMERICAN: 119 mL/min/{1.73_m2} (ref >=60)
GFR, EST NON AFRICAN AMERICAN: 103 mL/min/{1.73_m2} (ref >=60)
Glucose, Bld: 91 mg/dL (ref 65–99)
Potassium: 4.4 mmol/L (ref 3.5–5.3)
Sodium: 138 mmol/L (ref 135–146)

## 2017-11-14 LAB — EXTRA LAV TOP TUBE

## 2017-11-14 MED ORDER — TRAMADOL HCL 50 MG PO TABS: 50.0000 mg | ORAL_TABLET | Freq: Three times a day (TID) | ORAL | 2 refills | Status: DC | PRN

## 2017-11-14 NOTE — Progress Notes (Signed)
Subjective:    Patient ID: Theresa Washington, female    DOB: June 21, 1981, 36 y.o.   MRN: [id number]  Back Pain     Patient was recently seen by my partner and was started on tramadol 50 mg twice daily and Celebrex 100 mg twice daily for her fibromyalgia diffuse pain.  She states that she is doing much better on this combination.  The pain in her neck and her lower back and her arms and legs has improved.  She denies any GI upset on the Celebrex.  She denies any history of blood in her stool or melena.  There is no past medical history of chronic kidney disease.  She is taking the tramadol twice a day.  She denies any dizziness or sedation on the medicine  Past Medical History:  Diagnosis Date  . Anemia   . Ankylosing spondylitis ([place])   . Anxiety   . Back pain   . Bell's palsy   . Contraceptive management 11/30/2015  . DDD (degenerative disc disease)   . DDD (degenerative disc disease)   . Degenerative disc disease   . Fibromyalgia   . Fibromyalgia   . History of kidney stones   . Hypertension   . 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 Myositis   . Palpitations   . PVC (premature ventricular contraction)   . Seizures ([place])    unknown etiolgy; no meds and no seizures for 2 years.  . Smoker 11/30/2015  . Spondylosis   . Von Willebrand disease ([place])    Past Surgical History:  Procedure Laterality Date  . DILATION AND CURETTAGE OF UTERUS  1998  . DILITATION & CURRETTAGE/HYSTROSCOPY WITH NOVASURE ABLATION N/A 01/16/2016   Procedure: HYSTEROSCOPY WITH NOVASURE ABLATION;  Surgeon: Jonnie Kind, MD;  Location: [facility];  Service: Gynecology;  Laterality: N/A;  Uterine Cavity Length 4.0 cm Uterine Cavity Width 3.3cm Power 73  Time 1 minute 17seconds  . LAPAROSCOPIC BILATERAL SALPINGECTOMY Bilateral 01/16/2016   Procedure: LAPAROSCOPIC BILATERAL SALPINGECTOMY;  Surgeon: Jonnie Kind, MD;  Location: [facility];  Service: Gynecology;  Laterality: Bilateral;  procedure 1  . OOPHORECTOMY  2006   Left  side   Current Outpatient Medications on File Prior to Visit  Medication Sig Dispense Refill  . celecoxib (CELEBREX) 100 MG capsule Take 1 capsule (100 mg total) by mouth 2 (two) times daily. 60 capsule 2  . diclofenac sodium (VOLTAREN) 1 % GEL Apply 1 application topically daily as needed. For pain 100 g 3  . methylcellulose (ARTIFICIAL TEARS) 1 % ophthalmic solution Place 1 drop into both eyes daily as needed. Dry Eyes     No current facility-administered medications on file prior to visit.    Allergies  Allergen Reactions  . Shrimp [Shellfish Allergy] Anaphylaxis, Hives and Swelling  . Other     Pt states "I carry an Epipen. I had anaphylactic reaction to something but I don't know what".    Social History   Socioeconomic History  . Marital status: Married    Spouse name: Not on file  . Number of children: Not on file  . Years of education: Not on file  . Highest education level: Not on file  Social Needs  . Financial resource strain: Not on file  . Food insecurity - worry: Not on file  . Food insecurity - inability: Not on file  . Transportation needs - medical: Not on file  . Transportation needs - non-medical: Not on file  Occupational History  .  Not on file  Tobacco Use  . Smoking status: Former Smoker    Packs/day: 1.00    Years: 20.00    Pack years: 20.00    Types: Cigarettes    Last attempt to quit: 09/12/2016    Years since quitting: 1.1  . Smokeless tobacco: Never Used  Substance and Sexual Activity  . Alcohol use: No  . Drug use: No  . Sexual activity: Yes    Birth control/protection: Surgical  Other Topics Concern  . Not on file  Social History Narrative  . Not on file       Review of Systems  Musculoskeletal: Positive for back pain.  All other systems reviewed and are negative.      Objective:   Physical Exam  Cardiovascular: Normal rate, regular rhythm and normal heart sounds.  Pulmonary/Chest: Effort normal and breath sounds normal. No  respiratory distress. She has no wheezes. She has no rales.  Abdominal: Soft. Bowel sounds are normal.  Vitals reviewed.         Assessment & Plan:  Polyuria - Plan: BASIC METABOLIC PANEL WITH GFR  Fibromyalgia  Patient's pain is better managed on this combination.  Therefore we will continue her on Celebrex 100 mg p.o. twice daily and tramadol 50 mg p.o. twice daily.  I will give her 71 tablets/month as part of a pain contract.  We did discuss the risk of abuse and addiction to tramadol.  I believe that the risk is low in this patient.  She does complain of polydipsia and polyuria.  She denies any dysuria or urgency or frequency.  I will check a BMP to evaluate for hyperglycemia or diabetes insipidus or SIADH

## 2018-01-09 ENCOUNTER — Encounter: Payer: Self-pay | Admitting: Family Medicine

## 2018-01-09 ENCOUNTER — Ambulatory Visit (INDEPENDENT_AMBULATORY_CARE_PROVIDER_SITE_OTHER): Payer: Medicare Other | Admitting: Family Medicine

## 2018-01-09 VITALS — BP 130/82 | HR 80 | Temp 98.3°F | Resp 14 | Ht 62.0 in | Wt 148.0 lb

## 2018-01-09 DIAGNOSIS — M549 Dorsalgia, unspecified: Secondary | ICD-10-CM

## 2018-01-09 DIAGNOSIS — G43709 Chronic migraine without aura, not intractable, without status migrainosus: Secondary | ICD-10-CM | POA: Diagnosis not present

## 2018-01-09 DIAGNOSIS — R200 Anesthesia of skin: Secondary | ICD-10-CM | POA: Diagnosis not present

## 2018-01-09 DIAGNOSIS — R202 Paresthesia of skin: Secondary | ICD-10-CM

## 2018-01-09 MED ORDER — RIZATRIPTAN BENZOATE 10 MG PO TABS: 10.0000 mg | ORAL_TABLET | ORAL | 0 refills | Status: DC | PRN

## 2018-01-09 MED ORDER — TIZANIDINE HCL 4 MG PO TABS: 4.0000 mg | ORAL_TABLET | Freq: Four times a day (QID) | ORAL | 0 refills | Status: DC | PRN

## 2018-01-09 NOTE — Progress Notes (Signed)
Subjective:    Patient ID: Theresa Washington, female    DOB: 1981-06-04, 37 y.o.   MRN: [id number]  Back Pain      09/16/16 Patient is a 37 year old white female with a history of fibromyalgia. She reports a three-week history of pain around her tailbone. Exam was performed with a chaperone present. On examination she is tender at the tip of the coccyx. However she is also tender approximately 1 inch above the tip of the coccyx near the S3 foramen. She states that is painful to sit in this area. She also states that she has significant pain in this area with sexual intercourse however it is position dependent. She denies any falls or injuries. She denies any saddle anesthesia. She denies any vaginal discharge or vaginal bleeding. She denies any fevers or chills.  At that time, my plan was:  Patient has coccydynia. Begin Toradol 10 mg every 6 hours for the next 4 days. Recommended a hemorrhoid pillow whenever she is sitting in a chair. Recommended avoiding positions that exacerbate pain. If no better in 2 weeks, proceed with x-rays of that area.  10/24/16 Patient's tailbone has stopped hurting. However since that time, she has been having burning pain getting near the sciatic notch in the posterior left gluteus radiating down her hamstring below her knee into her calf. The pain is neuropathic in nature. It is burning and stinging. She denies any numbness but she does report some tingling in her feet. An MRI obtained of the lumbar spine in 2015 did show a left shallow bulging disc between L5-S1 contacting the left S1 nerve root. It is likely that she has reaggravated this after her recent fall. The pain has been constantly worsening over the last 2 weeks.  At that time, my plan was: Begin prednisone taper pack. Obtain x-ray of lumbar spine given the fact this occurred after trauma. If symptoms are persistent and x-ray provides no abnormality, proceed with an MRI of the lumbar spine possibly an  epidural steroid injection  01/31/17 Ports more than a week of pain between her shoulder blades at approximately the level of T3. The pain is more to the left side. There is no tenderness to palpation in that area. She does report some mild spinous process tenderness to palpation. There is no visible deformity. She has full flexion and extension of her back. She is able to bend over and touch her toes. She does report some mild pleurisy. She denies any hemoptysis cough or chest pain. She denies any shortness of breath. The pleurisy that she reports more for muscle tenderness in her back when she takes deep breath in sounding more muscular in nature.  At that time, my plan was: I believe this is likely muscular and possibly related to her fibromyalgia. Obtain a thoracic spine x-ray to rule out degenerative disc disease or arthritis. If the x-ray is relatively normal, I would focus on muscle relaxers and/or adding gabapentin versus Lyrica to manage her fibromyalgia pain  01/09/18 X-ray at that time revealed no abnormality.  Patient was given Zanaflex did not follow-up.  She is here today again complaining of pain in the middle of her back in approximately the level of T5.  The pain seems to be centered in the paraspinal muscles.  It radiates up and down the thoracic spine.  She denies any falls or injury.  This is been going on now for several weeks.  Exam today is unremarkable.  Patient also complains of  numbness and tingling in both hands.  She has been told that she has cervical radiculopathy however I have never seen any objective findings of this.  She also reports 3-4 migraines every month and would like medication to help manage her migraines Past Medical History:  Diagnosis Date  . Anemia   . Anxiety   . Back pain   . Bell's palsy   . Contraceptive management 11/30/2015  . DDD (degenerative disc disease)   . DDD (degenerative disc disease)   . Degenerative disc disease   . Fibromyalgia   .  Fibromyalgia   . History of kidney stones   . Hypertension   . 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[place])   . Myositis   . Palpitations   . PVC (premature ventricular contraction)   . Seizures ([place])    unknown etiolgy; no meds and no seizures for 2 years.  . Smoker 11/30/2015  . Spondylosis     Past Surgical History:  Procedure Laterality Date  . DILATION AND CURETTAGE OF UTERUS  1998  . DILITATION & CURRETTAGE/HYSTROSCOPY WITH NOVASURE ABLATION N/A 01/16/2016   Procedure: HYSTEROSCOPY WITH NOVASURE ABLATION;  Surgeon: Jonnie Kind, MD;  Location: [facility];  Service: Gynecology;  Laterality: N/A;  Uterine Cavity Length 4.0 cm Uterine Cavity Width 3.3cm Power 73  Time 1 minute 17seconds  . LAPAROSCOPIC BILATERAL SALPINGECTOMY Bilateral 01/16/2016   Procedure: LAPAROSCOPIC BILATERAL SALPINGECTOMY;  Surgeon: Jonnie Kind, MD;  Location: [facility];  Service: Gynecology;  Laterality: Bilateral;  procedure 1  . OOPHORECTOMY  2006   Left side   Current Outpatient Medications on File Prior to Visit  Medication Sig Dispense Refill  . celecoxib (CELEBREX) 100 MG capsule Take 1 capsule (100 mg total) by mouth 2 (two) times daily. 60 capsule 2  . diclofenac sodium (VOLTAREN) 1 % GEL Apply 1 application topically daily as needed. For pain 100 g 3  . methylcellulose (ARTIFICIAL TEARS) 1 % ophthalmic solution Place 1 drop into both eyes daily as needed. Dry Eyes    . traMADol (ULTRAM) 50 MG tablet Take 1 tablet (50 mg total) by mouth every 8 (eight) hours as needed. 60 tablet 2   No current facility-administered medications on file prior to visit.    Allergies  Allergen Reactions  . Shrimp [Shellfish Allergy] Anaphylaxis, Hives and Swelling  . Other     Pt states "I carry an Epipen. I had anaphylactic reaction to something but I don't know what".    Social History   Socioeconomic History  . Marital status: Married    Spouse name: Not on file  . Number of children: Not on file  . Years of education: Not on  file  . Highest education level: Not on file  Social Needs  . Financial resource strain: Not on file  . Food insecurity - worry: Not on file  . Food insecurity - inability: Not on file  . Transportation needs - medical: Not on file  . Transportation needs - non-medical: Not on file  Occupational History  . Not on file  Tobacco Use  . Smoking status: Former Smoker    Packs/day: 1.00    Years: 20.00    Pack years: 20.00    Types: Cigarettes    Last attempt to quit: 09/12/2016    Years since quitting: 1.3  . Smokeless tobacco: Never Used  Substance and Sexual Activity  . Alcohol use: No  . Drug use: No  . Sexual activity: Yes    Birth control/protection:  Surgical  Other Topics Concern  . Not on file  Social History Narrative  . Not on file       Review of Systems  Musculoskeletal: Positive for back pain.  All other systems reviewed and are negative.      Objective:   Physical Exam  Cardiovascular: Normal rate, regular rhythm and normal heart sounds.  Pulmonary/Chest: Effort normal and breath sounds normal. No respiratory distress. She has no wheezes. She has no rales.  Abdominal: Soft. Bowel sounds are normal.  Musculoskeletal:       Thoracic back: She exhibits tenderness and bony tenderness. She exhibits normal range of motion, no swelling, no deformity, no pain and no spasm.  Vitals reviewed.          Assessment & Plan:  Mid back pain on left side  Chronic migraine without aura without status migrainosus, not intractable  Numbness and tingling in both hands - Plan: Nerve conduction test  Regarding her migraines, I have recommended Maxalt 10 mg p.o. daily as needed migraine.  3-4 months is a reasonable amount.  Regarding the numbness and tingling in both hands, the patient tends to have somatic complaints without objective findings on studies.  Therefore rather than obtain an MRI of the cervical spine I will begin by obtaining nerve conduction studies to  truly determine if she has cervical radiculopathy versus peripheral nerve entrapment versus somatization.  Regarding the pain in the middle of her back, I believe this is likely muscular pain.  Previous images have been completely normal.  Exam today is normal rate I will treat the patient symptomatically with Zanaflex 4 mg every 6 hours as needed for muscle spasms.  Consider physical therapy if persistent

## 2018-01-21 ENCOUNTER — Other Ambulatory Visit: Payer: Self-pay | Admitting: Family Medicine

## 2018-01-30 ENCOUNTER — Other Ambulatory Visit: Payer: Self-pay | Admitting: Family Medicine

## 2018-01-30 DIAGNOSIS — R200 Anesthesia of skin: Secondary | ICD-10-CM

## 2018-01-30 DIAGNOSIS — R202 Paresthesia of skin: Principal | ICD-10-CM

## 2018-02-25 ENCOUNTER — Telehealth: Payer: Self-pay | Admitting: Neurology

## 2018-02-25 NOTE — Telephone Encounter (Signed)
It is OK to switch

## 2018-02-25 NOTE — Telephone Encounter (Signed)
Okay for switch

## 2018-02-25 NOTE — Telephone Encounter (Signed)
Pt stated that she has seen Dr. Krista Blue in the past but would like to switch to Dr. Otelia Limes this ok?

## 2018-02-26 NOTE — Telephone Encounter (Signed)
noted 

## 2018-03-13 ENCOUNTER — Other Ambulatory Visit: Payer: Self-pay | Admitting: Family Medicine

## 2018-03-13 MED ORDER — TRAMADOL HCL 50 MG PO TABS: 50.0000 mg | ORAL_TABLET | Freq: Three times a day (TID) | ORAL | 2 refills | Status: DC | PRN

## 2018-03-13 NOTE — Telephone Encounter (Signed)
Requesting refill    Tramadol   LOV: 01/09/18  LRF:  11/14/17

## 2018-04-01 ENCOUNTER — Encounter: Payer: Medicare Other | Admitting: Neurology

## 2018-04-01 ENCOUNTER — Ambulatory Visit: Payer: Medicare Other | Admitting: Neurology

## 2018-04-01 ENCOUNTER — Encounter: Payer: Self-pay | Admitting: Neurology

## 2018-04-01 ENCOUNTER — Ambulatory Visit (INDEPENDENT_AMBULATORY_CARE_PROVIDER_SITE_OTHER): Payer: Medicare Other | Admitting: Neurology

## 2018-04-01 DIAGNOSIS — R200 Anesthesia of skin: Secondary | ICD-10-CM | POA: Diagnosis not present

## 2018-04-01 DIAGNOSIS — R202 Paresthesia of skin: Secondary | ICD-10-CM

## 2018-04-01 DIAGNOSIS — M542 Cervicalgia: Secondary | ICD-10-CM

## 2018-04-01 NOTE — Procedures (Signed)
     HISTORY:  Theresa Washington is a 37 year old patient with a history of neck pain and intermittent numbness down the left arm since 2014.  The patient has noted some weakness involving the left hand as well.  She is being evaluated for this issue.  NERVE CONDUCTION STUDIES:  Nerve conduction studies were performed on both upper extremities. The distal motor latencies and motor amplitudes for the median and ulnar nerves were within normal limits. The nerve conduction velocities for these nerves were also normal. The sensory latencies for the median and ulnar nerves were normal.  The F-wave latencies for the ulnar nerves were within normal limits bilaterally.   EMG STUDIES:  EMG study was performed on the left upper extremity:  The first dorsal interosseous muscle reveals 2 to 4 K units with full recruitment. 1+ positive waves were noted. The abductor pollicis brevis muscle reveals 2 to 4 K units with full recruitment. No fibrillations or positive waves were noted. The extensor indicis proprius muscle reveals 1 to 3 K units with full recruitment. No fibrillations or positive waves were noted. The pronator teres muscle reveals 2 to 3 K units with full recruitment. No fibrillations or positive waves were noted.  The flexor digitorum profundus muscle (III-IV) reveals 2 to 3 K units with full recruitment.  No fibrillations or positive waves were noted. The biceps muscle reveals 1 to 2 K units with full recruitment. No fibrillations or positive waves were noted. The triceps muscle reveals 2 to 4 K units with full recruitment. No fibrillations or positive waves were noted. The anterior deltoid muscle reveals 2 to 3 K units with full recruitment. No fibrillations or positive waves were noted. The cervical paraspinal muscles were tested at 2 levels. No abnormalities of insertional activity were seen at either level tested. There was poor relaxation.   IMPRESSION:  Nerve conduction studies done  on both upper extremities were within normal limits.  No evidence of a neuropathy is seen.  EMG evaluation of the left upper extremity shows isolated mild acute denervation involving the first dorsal interosseous muscle.  The clinical significance of this is not clear, although this muscle gets innervation from the C8 and T1 nerve roots and from the ulnar nerve, the source of this finding cannot be confirmed on this evaluation.  Jill Alexanders MD 04/01/2018 3:15 PM  [facility] [address] [place], [place] [address]  Phone [phone] Fax [phone]

## 2018-04-01 NOTE — Progress Notes (Signed)
Please refer to EMG and nerve conduction study procedure note. 

## 2018-04-01 NOTE — Progress Notes (Signed)
[facility]    Nerve / Sites Muscle Latency Ref. Amplitude Ref. Rel Amp Segments Distance Velocity Ref. Area    ms ms mV mV %  cm m/s m/s mVms  R Median - APB     Wrist APB 3.2 ?4.4 7.2 ?4.0 100 Wrist - APB 7   28.3     Upper arm APB 6.7  7.1  98.9 Upper arm - Wrist 19 55 ?49 26.3  L Median - APB     Wrist APB 3.4 ?4.4 7.6 ?4.0 100 Wrist - APB 7   28.8     Upper arm APB 7.0  7.4  97.8 Upper arm - Wrist 19 53 ?49 28.3  R Ulnar - ADM     Wrist ADM 2.5 ?3.3 13.0 ?6.0 100 Wrist - ADM 7   34.7     B.Elbow ADM 5.3  12.4  95.4 B.Elbow - Wrist 17 62 ?49 35.1     A.Elbow ADM 6.9  11.4  91.4 A.Elbow - B.Elbow 10 62 ?49 32.3         A.Elbow - Wrist      L Ulnar - ADM     Wrist ADM 3.2 ?3.3 10.7 ?6.0 100 Wrist - ADM 7   29.1     B.Elbow ADM 6.3  9.6  89.5 B.Elbow - Wrist 17 56 ?49 28.4     A.Elbow ADM 8.1  9.2  96.1 A.Elbow - B.Elbow 10 55 ?49 26.4         A.Elbow - Wrist                 SNC    Nerve / Sites Rec. Site Peak Lat Ref.  Amp Ref. Segments Distance Peak Diff Ref.    ms ms V V  cm ms ms  R Median, Ulnar - Transcarpal comparison     Median Palm Wrist 2.1 ?2.2 55 ?35 Median Palm - Wrist 8       Ulnar Palm Wrist 1.9 ?2.2 23 ?12 Ulnar Palm - Wrist 8          Median Palm - Ulnar Palm  0.2 ?0.4  R Median - Orthodromic (Dig II, Mid palm)     Dig II Wrist 3.0 ?3.4 27 ?10 Dig II - Wrist 13    L Median - Orthodromic (Dig II, Mid palm)     Dig II Wrist 3.0 ?3.4 36 ?10 Dig II - Wrist 13    R Ulnar - Orthodromic, (Dig V, Mid palm)     Dig V Wrist 2.8 ?3.1 14 ?5 Dig V - Wrist 11    L Ulnar - Orthodromic, (Dig V, Mid palm)     Dig V Wrist 3.0 ?3.1 10 ?5 Dig V - Wrist 91                 F  Wave    Nerve F Lat Ref.   ms ms  R Ulnar - ADM 24.8 ?32.0  L Ulnar - ADM 26.0 ?32.0

## 2018-04-07 ENCOUNTER — Telehealth: Payer: Self-pay | Admitting: *Deleted

## 2018-04-07 NOTE — Telephone Encounter (Signed)
Appointment scheduled.

## 2018-04-07 NOTE — Telephone Encounter (Signed)
Received call from patient.   Patient reports that she is concerned about her neck pain and increased HA. States that she has been taking the Maxalt with no relief x2 weeks. States that she feels that HA are not migraine related but may be coming from neck pain. Requested MD to advise.

## 2018-04-07 NOTE — Telephone Encounter (Signed)
Be glad to see her

## 2018-04-10 ENCOUNTER — Other Ambulatory Visit: Payer: Self-pay

## 2018-04-10 ENCOUNTER — Ambulatory Visit (INDEPENDENT_AMBULATORY_CARE_PROVIDER_SITE_OTHER): Payer: Medicare Other | Admitting: Family Medicine

## 2018-04-10 ENCOUNTER — Encounter: Payer: Self-pay | Admitting: Family Medicine

## 2018-04-10 VITALS — BP 128/84 | HR 69 | Temp 97.9°F | Resp 14 | Ht 62.0 in | Wt 141.0 lb

## 2018-04-10 DIAGNOSIS — G44221 Chronic tension-type headache, intractable: Secondary | ICD-10-CM

## 2018-04-10 MED ORDER — TOPIRAMATE 25 MG PO TABS: 50.0000 mg | ORAL_TABLET | Freq: Two times a day (BID) | ORAL | 3 refills | Status: DC

## 2018-04-10 NOTE — Progress Notes (Signed)
Subjective:    Patient ID: Theresa Washington, female    DOB: 20-Aug-1981, 37 y.o.   MRN: [id number]  Back Pain  Associated symptoms include headaches.  Headache   Associated symptoms include back pain.     09/16/16 Patient is a 37 year old white female with a history of fibromyalgia. She reports a three-week history of pain around her tailbone. Exam was performed with a chaperone present. On examination she is tender at the tip of the coccyx. However she is also tender approximately 1 inch above the tip of the coccyx near the S3 foramen. She states that is painful to sit in this area. She also states that she has significant pain in this area with sexual intercourse however it is position dependent. She denies any falls or injuries. She denies any saddle anesthesia. She denies any vaginal discharge or vaginal bleeding. She denies any fevers or chills.  At that time, my plan was:  Patient has coccydynia. Begin Toradol 10 mg every 6 hours for the next 4 days. Recommended a hemorrhoid pillow whenever she is sitting in a chair. Recommended avoiding positions that exacerbate pain. If no better in 2 weeks, proceed with x-rays of that area.  10/24/16 Patient's tailbone has stopped hurting. However since that time, she has been having burning pain getting near the sciatic notch in the posterior left gluteus radiating down her hamstring below her knee into her calf. The pain is neuropathic in nature. It is burning and stinging. She denies any numbness but she does report some tingling in her feet. An MRI obtained of the lumbar spine in 2015 did show a left shallow bulging disc between L5-S1 contacting the left S1 nerve root. It is likely that she has reaggravated this after her recent fall. The pain has been constantly worsening over the last 2 weeks.  At that time, my plan was: Begin prednisone taper pack. Obtain x-ray of lumbar spine given the fact this occurred after trauma. If symptoms are persistent  and x-ray provides no abnormality, proceed with an MRI of the lumbar spine possibly an epidural steroid injection  01/31/17 Ports more than a week of pain between her shoulder blades at approximately the level of T3. The pain is more to the left side. There is no tenderness to palpation in that area. She does report some mild spinous process tenderness to palpation. There is no visible deformity. She has full flexion and extension of her back. She is able to bend over and touch her toes. She does report some mild pleurisy. She denies any hemoptysis cough or chest pain. She denies any shortness of breath. The pleurisy that she reports more for muscle tenderness in her back when she takes deep breath in sounding more muscular in nature.  At that time, my plan was: I believe this is likely muscular and possibly related to her fibromyalgia. Obtain a thoracic spine x-ray to rule out degenerative disc disease or arthritis. If the x-ray is relatively normal, I would focus on muscle relaxers and/or adding gabapentin versus Lyrica to manage her fibromyalgia pain  01/09/18 X-ray at that time revealed no abnormality.  Patient was given Zanaflex did not follow-up.  She is here today again complaining of pain in the middle of her back in approximately the level of T5.  The pain seems to be centered in the paraspinal muscles.  It radiates up and down the thoracic spine.  She denies any falls or injury.  This is been going on now  for several weeks.  Exam today is unremarkable.  Patient also complains of numbness and tingling in both hands.  She has been told that she has cervical radiculopathy however I have never seen any objective findings of this.  She also reports 3-4 migraines every month and would like medication to help manage her migraines Past Medical History:  Diagnosis Date  . Anemia   . Anxiety   . Back pain   . Bell's palsy   . Contraceptive management 11/30/2015  . DDD (degenerative disc disease)   . DDD  (degenerative disc disease)   . Degenerative disc disease   . Fibromyalgia   . Fibromyalgia   . History of kidney stones   . Hypertension   . 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 Myositis   . Palpitations   . PVC (premature ventricular contraction)   . Seizures ([place])    unknown etiolgy; no meds and no seizures for 2 years.  . Smoker 11/30/2015  . Spondylosis     Past Surgical History:  Procedure Laterality Date  . DILATION AND CURETTAGE OF UTERUS  1998  . DILITATION & CURRETTAGE/HYSTROSCOPY WITH NOVASURE ABLATION N/A 01/16/2016   Procedure: HYSTEROSCOPY WITH NOVASURE ABLATION;  Surgeon: Jonnie Kind, MD;  Location: [facility];  Service: Gynecology;  Laterality: N/A;  Uterine Cavity Length 4.0 cm Uterine Cavity Width 3.3cm Power 73  Time 1 minute 17seconds  . LAPAROSCOPIC BILATERAL SALPINGECTOMY Bilateral 01/16/2016   Procedure: LAPAROSCOPIC BILATERAL SALPINGECTOMY;  Surgeon: Jonnie Kind, MD;  Location: [facility];  Service: Gynecology;  Laterality: Bilateral;  procedure 1  . OOPHORECTOMY  2006   Left side   Current Outpatient Medications on File Prior to Visit  Medication Sig Dispense Refill  . celecoxib (CELEBREX) 100 MG capsule TAKE 1 CAPSULE BY MOUTH TWICE A DAY 60 capsule 2  . diclofenac sodium (VOLTAREN) 1 % GEL Apply 1 application topically daily as needed. For pain 100 g 3  . methylcellulose (ARTIFICIAL TEARS) 1 % ophthalmic solution Place 1 drop into both eyes daily as needed. Dry Eyes    . rizatriptan (MAXALT) 10 MG tablet Take 1 tablet (10 mg total) by mouth as needed for migraine. May repeat in 2 hours if needed 10 tablet 0  . tiZANidine (ZANAFLEX) 4 MG tablet Take 1 tablet (4 mg total) by mouth every 6 (six) hours as needed for muscle spasms. 30 tablet 0  . traMADol (ULTRAM) 50 MG tablet Take 1 tablet (50 mg total) by mouth every 8 (eight) hours as needed. 60 tablet 2   No current facility-administered medications on file prior to visit.    Allergies  Allergen Reactions  .  Shrimp [Shellfish Allergy] Anaphylaxis, Hives and Swelling  . Other     Pt states "I carry an Epipen. I had anaphylactic reaction to something but I don't know what".    Social History   Socioeconomic History  . Marital status: Married    Spouse name: Not on file  . Number of children: Not on file  . Years of education: Not on file  . Highest education level: Not on file  Occupational History  . Not on file  Social Needs  . Financial resource strain: Not on file  . Food insecurity:    Worry: Not on file    Inability: Not on file  . Transportation needs:    Medical: Not on file    Non-medical: Not on file  Tobacco Use  . Smoking status: Former Smoker  Packs/day: 1.00    Years: 20.00    Pack years: 20.00    Types: Cigarettes    Last attempt to quit: 09/12/2016    Years since quitting: 1.5  . Smokeless tobacco: Never Used  Substance and Sexual Activity  . Alcohol use: No  . Drug use: No  . Sexual activity: Yes    Birth control/protection: Surgical  Lifestyle  . Physical activity:    Days per week: Not on file    Minutes per session: Not on file  . Stress: Not on file  Relationships  . Social connections:    Talks on phone: Not on file    Gets together: Not on file    Attends religious service: Not on file    Active member of club or organization: Not on file    Attends meetings of clubs or organizations: Not on file    Relationship status: Not on file  . Intimate partner violence:    Fear of current or ex partner: Not on file    Emotionally abused: Not on file    Physically abused: Not on file    Forced sexual activity: Not on file  Other Topics Concern  . Not on file  Social History Narrative  . Not on file       Review of Systems  Musculoskeletal: Positive for back pain.  Neurological: Positive for headaches.  All other systems reviewed and are negative.      Objective:   Physical Exam  Constitutional: She is oriented to person, place, and  time.  Cardiovascular: Normal rate, regular rhythm and normal heart sounds.  Pulmonary/Chest: Effort normal and breath sounds normal. No respiratory distress. She has no wheezes. She has no rales.  Abdominal: Soft. Bowel sounds are normal.  Neurological: She is alert and oriented to person, place, and time. She has normal strength and normal reflexes. No cranial nerve deficit or sensory deficit. She displays a negative Romberg sign.  Vitals reviewed.          Assessment & Plan:  Chronic tension-type headache, intractable Patient's headaches sound like chronic tension headaches.  She can use Zanaflex 4 mg every 8 hours as needed for headache.  Meanwhile begin Topamax 25 mg p.o. nightly as a preventative medication.  Increase by 25 mg every week until by week 4 she will be taking 50 mg twice a day of Topamax.  Recheck in 4 to 5 weeks.

## 2018-04-14 ENCOUNTER — Encounter: Payer: Self-pay | Admitting: Family Medicine

## 2018-04-20 ENCOUNTER — Other Ambulatory Visit: Payer: Self-pay | Admitting: Family Medicine

## 2018-05-19 ENCOUNTER — Ambulatory Visit (INDEPENDENT_AMBULATORY_CARE_PROVIDER_SITE_OTHER): Payer: Medicare Other | Admitting: Family Medicine

## 2018-05-19 ENCOUNTER — Encounter: Payer: Self-pay | Admitting: Family Medicine

## 2018-05-19 VITALS — BP 112/68 | HR 78 | Temp 98.6°F | Resp 14 | Ht 62.0 in | Wt 137.0 lb

## 2018-05-19 DIAGNOSIS — G44221 Chronic tension-type headache, intractable: Secondary | ICD-10-CM

## 2018-05-19 LAB — COMPLETE METABOLIC PANEL WITH GFR
AG RATIO: 1.9 (calc) (ref 1.0–2.5)
ALT: 8 U/L (ref 6–29)
AST: 13 U/L (ref 10–30)
Albumin: 4.5 g/dL (ref 3.6–5.1)
Alkaline phosphatase (APISO): 64 U/L (ref 33–115)
BILIRUBIN TOTAL: 0.5 mg/dL (ref 0.2–1.2)
BUN: 13 mg/dL (ref 7–25)
CALCIUM: 9.2 mg/dL (ref 8.6–10.2)
CO2: 25 mmol/L (ref 20–32)
Chloride: 107 mmol/L (ref 98–110)
Creat: 0.86 mg/dL (ref 0.50–1.10)
GFR, EST AFRICAN AMERICAN: 100 mL/min/{1.73_m2} (ref >=60)
GFR, EST NON AFRICAN AMERICAN: 86 mL/min/{1.73_m2} (ref >=60)
GLOBULIN: 2.4 g/dL (ref 1.9–3.7)
Glucose, Bld: 96 mg/dL (ref 65–99)
POTASSIUM: 3.5 mmol/L (ref 3.5–5.3)
SODIUM: 139 mmol/L (ref 135–146)
Total Protein: 6.9 g/dL (ref 6.1–8.1)

## 2018-05-19 NOTE — Progress Notes (Signed)
Subjective:    Patient ID: Theresa Washington, female    DOB: 02-12-1981, 37 y.o.   MRN: [id number]  Headache   Associated symptoms include back pain.  Back Pain  Associated symptoms include headaches.    At the patient's last office visit she was started on Topamax 25 mg a day and increase to Topamax 25 mg twice a day for chronic tension-like headaches.  She never increase the medication beyond that dose due to paresthesias in her upper extremities.  She reports pins-and-needles tingling in her arms in the morning after she takes the medication for approximately 1 hour and then in the evenings prior to taking the medication.  However the medicine has completely stopped the headaches that she was experiencing her pain is much better.  Therefore she is very much satisfied with the medication and does not want to stop it. Past Medical History:  Diagnosis Date  . Anemia   . Anxiety   . Back pain   . Bell's palsy   . Contraceptive management 11/30/2015  . DDD (degenerative disc disease)   . DDD (degenerative disc disease)   . Degenerative disc disease   . Fibromyalgia   . Fibromyalgia   . History of kidney stones   . Hypertension   . 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 Myositis   . Palpitations   . PVC (premature ventricular contraction)   . Seizures ([place])    unknown etiolgy; no meds and no seizures for 2 years.  . Smoker 11/30/2015  . Spondylosis     Past Surgical History:  Procedure Laterality Date  . DILATION AND CURETTAGE OF UTERUS  1998  . DILITATION & CURRETTAGE/HYSTROSCOPY WITH NOVASURE ABLATION N/A 01/16/2016   Procedure: HYSTEROSCOPY WITH NOVASURE ABLATION;  Surgeon: Jonnie Kind, MD;  Location: [facility];  Service: Gynecology;  Laterality: N/A;  Uterine Cavity Length 4.0 cm Uterine Cavity Width 3.3cm Power 73  Time 1 minute 17seconds  . LAPAROSCOPIC BILATERAL SALPINGECTOMY Bilateral 01/16/2016   Procedure: LAPAROSCOPIC BILATERAL SALPINGECTOMY;  Surgeon: Jonnie Kind, MD;   Location: [facility];  Service: Gynecology;  Laterality: Bilateral;  procedure 1  . OOPHORECTOMY  2006   Left side   Current Outpatient Medications on File Prior to Visit  Medication Sig Dispense Refill  . celecoxib (CELEBREX) 100 MG capsule TAKE 1 CAPSULE BY MOUTH TWICE DAILY 60 capsule 0  . diclofenac sodium (VOLTAREN) 1 % GEL Apply 1 application topically daily as needed. For pain 100 g 3  . methylcellulose (ARTIFICIAL TEARS) 1 % ophthalmic solution Place 1 drop into both eyes daily as needed. Dry Eyes    . rizatriptan (MAXALT) 10 MG tablet Take 1 tablet (10 mg total) by mouth as needed for migraine. May repeat in 2 hours if needed 10 tablet 0  . tiZANidine (ZANAFLEX) 4 MG tablet Take 1 tablet (4 mg total) by mouth every 6 (six) hours as needed for muscle spasms. 30 tablet 0  . topiramate (TOPAMAX) 25 MG tablet Take 2 tablets (50 mg total) by mouth 2 (two) times daily. (Patient taking differently: Take 25 mg by mouth 2 (two) times daily. ) 120 tablet 3  . traMADol (ULTRAM) 50 MG tablet Take 1 tablet (50 mg total) by mouth every 8 (eight) hours as needed. 60 tablet 2   No current facility-administered medications on file prior to visit.    Allergies  Allergen Reactions  . Shrimp [Shellfish Allergy] Anaphylaxis, Hives and Swelling  . Other     Pt  states "I carry an Epipen. I had anaphylactic reaction to something but I don't know what".    Social History   Socioeconomic History  . Marital status: Married    Spouse name: Not on file  . Number of children: Not on file  . Years of education: Not on file  . Highest education level: Not on file  Occupational History  . Not on file  Social Needs  . Financial resource strain: Not on file  . Food insecurity:    Worry: Not on file    Inability: Not on file  . Transportation needs:    Medical: Not on file    Non-medical: Not on file  Tobacco Use  . Smoking status: Former Smoker    Packs/day: 1.00    Years: 20.00    Pack years: 20.00     Types: Cigarettes    Last attempt to quit: 09/12/2016    Years since quitting: 1.6  . Smokeless tobacco: Never Used  Substance and Sexual Activity  . Alcohol use: No  . Drug use: No  . Sexual activity: Yes    Birth control/protection: Surgical  Lifestyle  . Physical activity:    Days per week: Not on file    Minutes per session: Not on file  . Stress: Not on file  Relationships  . Social connections:    Talks on phone: Not on file    Gets together: Not on file    Attends religious service: Not on file    Active member of club or organization: Not on file    Attends meetings of clubs or organizations: Not on file    Relationship status: Not on file  . Intimate partner violence:    Fear of current or ex partner: Not on file    Emotionally abused: Not on file    Physically abused: Not on file    Forced sexual activity: Not on file  Other Topics Concern  . Not on file  Social History Narrative  . Not on file       Review of Systems  Musculoskeletal: Positive for back pain.  Neurological: Positive for headaches.  All other systems reviewed and are negative.      Objective:   Physical Exam  Constitutional: She is oriented to person, place, and time.  Cardiovascular: Normal rate, regular rhythm and normal heart sounds.  Pulmonary/Chest: Effort normal and breath sounds normal. No respiratory distress. She has no wheezes. She has no rales.  Abdominal: Soft. Bowel sounds are normal.  Neurological: She is alert and oriented to person, place, and time. She has normal strength and normal reflexes. No cranial nerve deficit or sensory deficit. She displays a negative Romberg sign.  Vitals reviewed.          Assessment & Plan:  Chronic tension-type headache, intractable - Plan: COMPLETE METABOLIC PANEL WITH GFR  Continue Topamax 25 mg p.o. twice daily for chronic tension type headaches/chronic daily headache.  Check a CMP to evaluate for any electrolyte disturbances that  could explain the patient's paresthesias.  Recheck the patient in 3 to 4 months.  If headaches have remained absent for 3 to 4 months, I would wean the patient off Topamax.  We discussed the risk of birth defects but she has had a tubal ligation.  We have also discussed the long-term risk of osteoporosis due to seizure medication but hopefully we can discontinue the medication after 3 to 4 months

## 2018-05-22 ENCOUNTER — Telehealth: Payer: Self-pay | Admitting: Family Medicine

## 2018-05-22 NOTE — Telephone Encounter (Signed)
Tried to call x 2 - line busy 

## 2018-05-22 NOTE — Telephone Encounter (Signed)
Patient left vm states she didn't hang up on you her cell phone dropped the call. She didn't understand what you were telling her and would like a call back. She did say that she is out of town until Monday.   CB# [phone]

## 2018-05-25 ENCOUNTER — Other Ambulatory Visit: Payer: Self-pay | Admitting: Family Medicine

## 2018-05-27 NOTE — Telephone Encounter (Signed)
Tried to call pt again and line busy x 2 - called pt on cell phone and she did not know her home number was acting up but wondered by it has not rang in a while. Pt aware of results

## 2018-06-12 ENCOUNTER — Other Ambulatory Visit: Payer: Self-pay | Admitting: Family Medicine

## 2018-06-15 NOTE — Telephone Encounter (Signed)
Ok to refill??  Last office visit 05/19/2018.  Last refill 03/13/2018, #2 refills.

## 2018-07-06 ENCOUNTER — Ambulatory Visit (INDEPENDENT_AMBULATORY_CARE_PROVIDER_SITE_OTHER): Payer: Medicare Other | Admitting: Family Medicine

## 2018-07-06 ENCOUNTER — Encounter: Payer: Self-pay | Admitting: Family Medicine

## 2018-07-06 VITALS — BP 122/64 | HR 90 | Temp 97.9°F | Resp 14 | Ht 62.0 in | Wt 137.0 lb

## 2018-07-06 DIAGNOSIS — M5432 Sciatica, left side: Secondary | ICD-10-CM

## 2018-07-06 MED ORDER — PREDNISONE 20 MG PO TABS: ORAL_TABLET | ORAL | 0 refills | Status: DC

## 2018-07-06 NOTE — Progress Notes (Signed)
Subjective:    Patient ID: Theresa Washington, female    DOB: 1980-11-20, 37 y.o.   MRN: [id number]  Back Pain      09/16/16 Patient is a 37 year old white female with a history of fibromyalgia. She reports a three-week history of pain around her tailbone. Exam was performed with a chaperone present. On examination she is tender at the tip of the coccyx. However she is also tender approximately 1 inch above the tip of the coccyx near the S3 foramen. She states that is painful to sit in this area. She also states that she has significant pain in this area with sexual intercourse however it is position dependent. She denies any falls or injuries. She denies any saddle anesthesia. She denies any vaginal discharge or vaginal bleeding. She denies any fevers or chills.  At that time, my plan was:  Patient has coccydynia. Begin Toradol 10 mg every 6 hours for the next 4 days. Recommended a hemorrhoid pillow whenever she is sitting in a chair. Recommended avoiding positions that exacerbate pain. If no better in 2 weeks, proceed with x-rays of that area.  10/24/16 Patient's tailbone has stopped hurting. However since that time, she has been having burning pain getting near the sciatic notch in the posterior left gluteus radiating down her hamstring below her knee into her calf. The pain is neuropathic in nature. It is burning and stinging. She denies any numbness but she does report some tingling in her feet. An MRI obtained of the lumbar spine in 2015 did show a left shallow bulging disc between L5-S1 contacting the left S1 nerve root. It is likely that she has reaggravated this after her recent fall. The pain has been constantly worsening over the last 2 weeks.  At that time, my plan was: Begin prednisone taper pack. Obtain x-ray of lumbar spine given the fact this occurred after trauma. If symptoms are persistent and x-ray provides no abnormality, proceed with an MRI of the lumbar spine possibly an  epidural steroid injection  05/08/17 The pain in the patient's back has returned. She reports pain at the level of L5 radiating into her left posterior hip down her left leg into her left foot. She also reports paresthesias in the leg as well.  At that time, my plan was: Patient was started on a prednisone taper pack and was given Norco 7.5/325 every 6 hrs prn pain (30).  Reassess in 1 week and if no better consult interventional radiology  for possible ESI.    07/06/18 Patient states that 3 to 4 weeks ago the pain returned.  It originates to the left side of the spine roughly at the level of L4-L5.  It radiates into her left gluteus down the posterior aspect of her left hamstring down to her mid calf below her left knee.  The pain is described as a deep aching pain similar to what she experienced last year.  She saw substantial benefit from an epidural steroid injection and the pain just returned.  She has been taking Celebrex for the last 3 weeks without any relief.  She is tried gabapentin and Lyrica in the past but never experienced any relief from previous pain that she had when she took those medication.  She is taking tramadol with no relief.  She denies any saddle anesthesias.  She denies any bowel or bladder incontinence.  She denies any leg weakness Past Medical History:  Diagnosis Date  . Anemia   . Anxiety   .  Back pain   . Bell's palsy   . Contraceptive management 11/30/2015  . DDD (degenerative disc disease)   . DDD (degenerative disc disease)   . Degenerative disc disease   . Fibromyalgia   . Fibromyalgia   . History of kidney stones   . Hypertension   . 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 Myositis   . Palpitations   . PVC (premature ventricular contraction)   . Seizures ([place])    unknown etiolgy; no meds and no seizures for 2 years.  . Smoker 11/30/2015  . Spondylosis    Past Surgical History:  Procedure Laterality Date  . DILATION AND CURETTAGE OF UTERUS  1998  . DILITATION &  CURRETTAGE/HYSTROSCOPY WITH NOVASURE ABLATION N/A 01/16/2016   Procedure: HYSTEROSCOPY WITH NOVASURE ABLATION;  Surgeon: Jonnie Kind, MD;  Location: [facility];  Service: Gynecology;  Laterality: N/A;  Uterine Cavity Length 4.0 cm Uterine Cavity Width 3.3cm Power 73  Time 1 minute 17seconds  . LAPAROSCOPIC BILATERAL SALPINGECTOMY Bilateral 01/16/2016   Procedure: LAPAROSCOPIC BILATERAL SALPINGECTOMY;  Surgeon: Jonnie Kind, MD;  Location: [facility];  Service: Gynecology;  Laterality: Bilateral;  procedure 1  . OOPHORECTOMY  2006   Left side   Current Outpatient Medications on File Prior to Visit  Medication Sig Dispense Refill  . celecoxib (CELEBREX) 100 MG capsule TAKE 1 CAPSULE BY MOUTH TWICE DAILY 60 capsule 3  . diclofenac sodium (VOLTAREN) 1 % GEL Apply 1 application topically daily as needed. For pain 100 g 3  . methylcellulose (ARTIFICIAL TEARS) 1 % ophthalmic solution Place 1 drop into both eyes daily as needed. Dry Eyes    . rizatriptan (MAXALT) 10 MG tablet Take 1 tablet (10 mg total) by mouth as needed for migraine. May repeat in 2 hours if needed 10 tablet 0  . tiZANidine (ZANAFLEX) 4 MG tablet Take 1 tablet (4 mg total) by mouth every 6 (six) hours as needed for muscle spasms. 30 tablet 0  . topiramate (TOPAMAX) 25 MG tablet Take 2 tablets (50 mg total) by mouth 2 (two) times daily. (Patient taking differently: Take 25 mg by mouth 2 (two) times daily. ) 120 tablet 3  . traMADol (ULTRAM) 50 MG tablet TAKE 1 TABLET BY MOUTH EVERY 8 HOURS AS NEEDED 60 tablet 0   No current facility-administered medications on file prior to visit.    Allergies  Allergen Reactions  . Shrimp [Shellfish Allergy] Anaphylaxis, Hives and Swelling  . Other     Pt states "I carry an Epipen. I had anaphylactic reaction to something but I don't know what".    Social History   Socioeconomic History  . Marital status: Married    Spouse name: Not on file  . Number of children: Not on file  . Years of  education: Not on file  . Highest education level: Not on file  Occupational History  . Not on file  Social Needs  . Financial resource strain: Not on file  . Food insecurity:    Worry: Not on file    Inability: Not on file  . Transportation needs:    Medical: Not on file    Non-medical: Not on file  Tobacco Use  . Smoking status: Former Smoker    Packs/day: 1.00    Years: 20.00    Pack years: 20.00    Types: Cigarettes    Last attempt to quit: 09/12/2016    Years since quitting: 1.8  . Smokeless tobacco: Never Used  Substance and  Sexual Activity  . Alcohol use: No  . Drug use: No  . Sexual activity: Yes    Birth control/protection: Surgical  Lifestyle  . Physical activity:    Days per week: Not on file    Minutes per session: Not on file  . Stress: Not on file  Relationships  . Social connections:    Talks on phone: Not on file    Gets together: Not on file    Attends religious service: Not on file    Active member of club or organization: Not on file    Attends meetings of clubs or organizations: Not on file    Relationship status: Not on file  . Intimate partner violence:    Fear of current or ex partner: Not on file    Emotionally abused: Not on file    Physically abused: Not on file    Forced sexual activity: Not on file  Other Topics Concern  . Not on file  Social History Narrative  . Not on file       Review of Systems  Musculoskeletal: Positive for back pain.  All other systems reviewed and are negative.      Objective:   Physical Exam  Cardiovascular: Normal rate, regular rhythm and normal heart sounds.  Pulmonary/Chest: Effort normal and breath sounds normal. No respiratory distress. She has no wheezes. She has no rales.  Abdominal: Soft. Bowel sounds are normal.  Musculoskeletal:       Lumbar back: She exhibits bony tenderness. She exhibits normal range of motion and no tenderness.       Back:       Legs: Vitals reviewed.  Negative  straight leg raise. Muscle strength is 5 over 5 equal and symmetric in both legs. She has normal 2 over 4 reflexes at the knee and Achilles bilaterally        Assessment & Plan:  Left sided sciatica Patient agrees to a prednisone taper pack.  Previous MRI possibly showed left-sided S1 nerve root impingement.  We discussed options for therapy including prednisone taper pack versus epidural steroid injection versus a trial of gabapentin.  Patient elects to try prednisone taper pack.  We discussed the risk of repeated use of prednisone including avascular necrosis, osteoporosis, etc.  Patient is willing to accept the risk due to the severity of the pain.  Reassess in 1 to 2 weeks if pain is not better.

## 2018-07-12 ENCOUNTER — Other Ambulatory Visit: Payer: Self-pay | Admitting: Family Medicine

## 2018-07-13 NOTE — Telephone Encounter (Signed)
Requesting refill    Tramadol  LOV: 07/06/18  LRF:  06/15/18

## 2018-07-14 ENCOUNTER — Telehealth: Payer: Self-pay | Admitting: Family Medicine

## 2018-07-14 DIAGNOSIS — M5137 Other intervertebral disc degeneration, lumbosacral region: Secondary | ICD-10-CM

## 2018-07-14 NOTE — Telephone Encounter (Signed)
Pt called and states that her back is not much better and would like to have the injections done but she needs [place] to order - ok to order ESI? (at [facility])

## 2018-07-14 NOTE — Telephone Encounter (Signed)
Ok for [facility] at [employer].

## 2018-07-15 NOTE — Telephone Encounter (Signed)
Order placed

## 2018-07-17 ENCOUNTER — Other Ambulatory Visit: Payer: Self-pay | Admitting: Family Medicine

## 2018-07-17 DIAGNOSIS — M5137 Other intervertebral disc degeneration, lumbosacral region: Secondary | ICD-10-CM

## 2018-07-30 ENCOUNTER — Encounter: Payer: Self-pay | Admitting: Radiology

## 2018-07-30 ENCOUNTER — Ambulatory Visit
Admission: RE | Admit: 2018-07-30 | Discharge: 2018-07-30 | Disposition: A | Discharge status: Home / Self Care | Payer: Medicare Other | Source: Ambulatory Visit | Attending: Family Medicine | Admitting: Family Medicine

## 2018-07-30 DIAGNOSIS — M545 Low back pain: Secondary | ICD-10-CM | POA: Diagnosis not present

## 2018-07-30 DIAGNOSIS — M5137 Other intervertebral disc degeneration, lumbosacral region: Secondary | ICD-10-CM

## 2018-07-30 MED ORDER — IOPAMIDOL (ISOVUE-M 200) INJECTION 41%
1.0000 mL | Freq: Once | INTRAMUSCULAR | Status: AC
  Administered 2018-07-30: 1 mL via EPIDURAL

## 2018-07-30 MED ORDER — METHYLPREDNISOLONE ACETATE 40 MG/ML INJ SUSP (RADIOLOG
120.0000 mg | Freq: Once | INTRAMUSCULAR | Status: AC
  Administered 2018-07-30: 120 mg via EPIDURAL

## 2018-07-30 NOTE — Discharge Instructions (Signed)

## 2018-08-12 ENCOUNTER — Other Ambulatory Visit: Payer: Self-pay | Admitting: Family Medicine

## 2018-08-13 NOTE — Telephone Encounter (Signed)
Requesting refill Tramadol      LOV: 07/06/18  LRF:  07/13/18

## 2018-09-15 ENCOUNTER — Other Ambulatory Visit: Payer: Self-pay | Admitting: Family Medicine

## 2018-09-15 NOTE — Telephone Encounter (Signed)
Last OV 07/06/2018 Last refill 08/13/2018 Ok to refill?

## 2018-09-17 ENCOUNTER — Other Ambulatory Visit: Payer: Self-pay

## 2018-09-17 ENCOUNTER — Ambulatory Visit (INDEPENDENT_AMBULATORY_CARE_PROVIDER_SITE_OTHER): Payer: Medicare Other | Admitting: Family Medicine

## 2018-09-17 ENCOUNTER — Encounter: Payer: Self-pay | Admitting: Family Medicine

## 2018-09-17 VITALS — BP 134/90 | HR 93 | Wt 136.0 lb

## 2018-09-17 DIAGNOSIS — M7070 Other bursitis of hip, unspecified hip: Secondary | ICD-10-CM

## 2018-09-17 MED ORDER — PREDNISONE 20 MG PO TABS: ORAL_TABLET | ORAL | 0 refills | Status: DC

## 2018-09-17 NOTE — Progress Notes (Signed)
Subjective:    Patient ID: Theresa Washington, female    DOB: 1981-01-19, 37 y.o.   MRN: [id number]  Patient is a 37 year old Caucasian female who has pain over the ischial tuberosity bilaterally.  Pain is been there for more than a month.  It hurts to sit.  Is getting progressively worse.  She has a difficult time even sitting in her car.  The pain does not radiate.  Exacerbating factors include putting pressure on the ischial tuberosity.  Alleviating factors include standing up and removing pressure from the initial tuberosity.  She denies any numbness or tingling in her legs.  She denies any weakness in her legs.  She denies any fevers or chills.  Flexion, extension, internal rotation, external rotation of the hip is all pain-free and does not elicit any pain.  She has no tenderness to palpation over the greater trochanter. Past Medical History:  Diagnosis Date  . Anemia   . Anxiety   . Back pain   . Bell's palsy   . Contraceptive management 11/30/2015  . DDD (degenerative disc disease)   . DDD (degenerative disc disease)   . Degenerative disc disease   . Fibromyalgia   . Fibromyalgia   . History of kidney stones   . Hypertension   . 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[place])   . Myositis   . Palpitations   . PVC (premature ventricular contraction)   . Seizures ([place])    unknown etiolgy; no meds and no seizures for 2 years.  . Smoker 11/30/2015  . Spondylosis    Past Surgical History:  Procedure Laterality Date  . DILATION AND CURETTAGE OF UTERUS  1998  . DILITATION & CURRETTAGE/HYSTROSCOPY WITH NOVASURE ABLATION N/A 01/16/2016   Procedure: HYSTEROSCOPY WITH NOVASURE ABLATION;  Surgeon: Jonnie Kind, MD;  Location: [facility];  Service: Gynecology;  Laterality: N/A;  Uterine Cavity Length 4.0 cm Uterine Cavity Width 3.3cm Power 73  Time 1 minute 17seconds  . LAPAROSCOPIC BILATERAL SALPINGECTOMY Bilateral 01/16/2016   Procedure: LAPAROSCOPIC BILATERAL SALPINGECTOMY;  Surgeon: Jonnie Kind, MD;   Location: [facility];  Service: Gynecology;  Laterality: Bilateral;  procedure 1  . OOPHORECTOMY  2006   Left side   Current Outpatient Medications on File Prior to Visit  Medication Sig Dispense Refill  . celecoxib (CELEBREX) 100 MG capsule TAKE 1 CAPSULE BY MOUTH TWICE DAILY 60 capsule 3  . diclofenac sodium (VOLTAREN) 1 % GEL Apply 1 application topically daily as needed. For pain 100 g 3  . methylcellulose (ARTIFICIAL TEARS) 1 % ophthalmic solution Place 1 drop into both eyes daily as needed. Dry Eyes    . rizatriptan (MAXALT) 10 MG tablet Take 1 tablet (10 mg total) by mouth as needed for migraine. May repeat in 2 hours if needed 10 tablet 0  . tiZANidine (ZANAFLEX) 4 MG tablet Take 1 tablet (4 mg total) by mouth every 6 (six) hours as needed for muscle spasms. 30 tablet 0  . topiramate (TOPAMAX) 25 MG tablet Take 2 tablets (50 mg total) by mouth 2 (two) times daily. (Patient taking differently: Take 25 mg by mouth 2 (two) times daily. ) 120 tablet 3  . traMADol (ULTRAM) 50 MG tablet TAKE 1 TABLET BY MOUTH EVERY 8 HOURS AS NEEDED 60 tablet 0   No current facility-administered medications on file prior to visit.    Allergies  Allergen Reactions  . Shrimp [Shellfish Allergy] Anaphylaxis, Hives and Swelling  . Other     Pt states "I carry an  Epipen. I had anaphylactic reaction to something but I don't know what".    Social History   Socioeconomic History  . Marital status: Married    Spouse name: Not on file  . Number of children: Not on file  . Years of education: Not on file  . Highest education level: Not on file  Occupational History  . Not on file  Social Needs  . Financial resource strain: Not on file  . Food insecurity:    Worry: Not on file    Inability: Not on file  . Transportation needs:    Medical: Not on file    Non-medical: Not on file  Tobacco Use  . Smoking status: Former Smoker    Packs/day: 1.00    Years: 20.00    Pack years: 20.00    Types: Cigarettes     Last attempt to quit: 09/12/2016    Years since quitting: 2.0  . Smokeless tobacco: Never Used  Substance and Sexual Activity  . Alcohol use: No  . Drug use: No  . Sexual activity: Yes    Birth control/protection: Surgical  Lifestyle  . Physical activity:    Days per week: Not on file    Minutes per session: Not on file  . Stress: Not on file  Relationships  . Social connections:    Talks on phone: Not on file    Gets together: Not on file    Attends religious service: Not on file    Active member of club or organization: Not on file    Attends meetings of clubs or organizations: Not on file    Relationship status: Not on file  . Intimate partner violence:    Fear of current or ex partner: Not on file    Emotionally abused: Not on file    Physically abused: Not on file    Forced sexual activity: Not on file  Other Topics Concern  . Not on file  Social History Narrative  . Not on file       Review of Systems  Musculoskeletal: Positive for back pain.  All other systems reviewed and are negative.      Objective:   Physical Exam  Cardiovascular: Normal rate, regular rhythm and normal heart sounds.  Pulmonary/Chest: Effort normal and breath sounds normal. No respiratory distress. She has no wheezes. She has no rales.  Abdominal: Soft. Bowel sounds are normal.  Musculoskeletal:       Lumbar back: She exhibits normal range of motion, no tenderness and no bony tenderness.       Left upper leg: She exhibits tenderness and bony tenderness.       Legs: Vitals reviewed.  Negative straight leg raise. Muscle strength is 5 over 5 equal and symmetric in both legs. She has normal 2 over 4 reflexes at the knee and Achilles bilaterally        Assessment & Plan:  Ischial bursitis, unspecified laterality - Plan: predniSONE (DELTASONE) 20 MG tablet, Ambulatory referral to Orthopedic Surgery Patient agrees to a prednisone taper pack.  She is tried and failed Celebrex already.  If  prednisone does not improve the pain over the initial tuberosity, I would recommend orthopedic consultation for possible cortisone injection into the ischial tuberosity bursa as I do not perform that injection.

## 2018-09-27 ENCOUNTER — Other Ambulatory Visit: Payer: Self-pay | Admitting: Family Medicine

## 2018-10-13 ENCOUNTER — Other Ambulatory Visit: Payer: Self-pay | Admitting: Family Medicine

## 2018-10-14 NOTE — Telephone Encounter (Signed)
Requesting refill  Tramadol    LOV:  09/17/18  LRF:  09/15/18

## 2018-10-20 DIAGNOSIS — Z0271 Encounter for disability determination: Secondary | ICD-10-CM

## 2018-10-21 ENCOUNTER — Ambulatory Visit (INDEPENDENT_AMBULATORY_CARE_PROVIDER_SITE_OTHER): Payer: Medicare Other

## 2018-10-21 ENCOUNTER — Encounter: Payer: Self-pay | Admitting: Orthopedic Surgery

## 2018-10-21 ENCOUNTER — Ambulatory Visit: Payer: Medicare Other | Admitting: Orthopedic Surgery

## 2018-10-21 ENCOUNTER — Other Ambulatory Visit: Payer: Self-pay | Admitting: Orthopedic Surgery

## 2018-10-21 VITALS — BP 146/96 | HR 83 | Ht 61.0 in | Wt 136.0 lb

## 2018-10-21 DIAGNOSIS — M25551 Pain in right hip: Secondary | ICD-10-CM

## 2018-10-21 DIAGNOSIS — M25552 Pain in left hip: Secondary | ICD-10-CM | POA: Diagnosis not present

## 2018-10-21 DIAGNOSIS — M7062 Trochanteric bursitis, left hip: Secondary | ICD-10-CM

## 2018-10-21 DIAGNOSIS — M7061 Trochanteric bursitis, right hip: Secondary | ICD-10-CM | POA: Diagnosis not present

## 2018-10-21 NOTE — Patient Instructions (Signed)
Hip Bursitis Hip bursitis is inflammation of a fluid-filled sac (bursa) in the hip joint. The bursa protects the bones in the hip joint from rubbing against each other. Hip bursitis can cause mild to moderate pain, and symptoms often come and go over time. What are the causes? This condition may be caused by:  Injury to the hip.  Overuse of the muscles that surround the hip joint.  Arthritis or gout.  Diabetes.  Thyroid disease.  Cold weather.  Infection.  In some cases, the cause may not be known. What are the signs or symptoms? Symptoms of this condition may include:  Mild or moderate pain in the hip area. Pain may get worse with movement.  Tenderness and swelling of the hip, especially on the outer side of the hip.  Symptoms may come and go. If the bursa becomes infected, you may have the following symptoms:  Fever.  Red skin and a feeling of warmth in the hip area.  How is this diagnosed? This condition may be diagnosed based on:  A physical exam.  Your medical history.  X-rays.  Removal of fluid from your inflamed bursa for testing (biopsy).  You may be sent to a health care provider who specializes in bone diseases (orthopedist) or a provider who specializes in joint inflammation (rheumatologist). How is this treated? This condition is treated by resting, raising (elevating), and applying pressure(compression) to the injured area. In some cases, this may be enough to make your symptoms go away. Treatment may also include:  Crutches.  Antibiotic medicine.  Draining fluid out of the bursa to help relieve swelling.  Injecting medicine that helps to reduce inflammation (cortisone).  Follow these instructions at home: Medicines  Take over-the-counter and prescription medicines only as told by your health care provider.  Do not drive or operate heavy machinery while taking prescription pain medicine, or as told by your health care provider.  If you were  prescribed an antibiotic, take it as told by your health care provider. Do not stop taking the antibiotic even if you start to feel better. Activity  Return to your normal activities as told by your health care provider. Ask your health care provider what activities are safe for you.  Rest and protect your hip as much as possible until your pain and swelling get better. General instructions  Wear compression wraps only as told by your health care provider.  Elevate your hip above the level of your heart as much as you can without pain. To do this, try putting a pillow under your hips while you lie down.  Do not use your hip to support your body weight until your health care provider says that you can. Use crutches as told by your health care provider.  Gently massage and stretch your injured area as often as is comfortable.  Keep all follow-up visits as told by your health care provider. This is important. How is this prevented?  Exercise regularly, as told by your health care provider.  Warm up and stretch before being active.  Cool down and stretch after being active.  If an activity irritates your hip or causes pain, avoid the activity as much as possible.  Avoid sitting down for long periods at a time. Contact a health care provider if:  You have a fever.  You develop new symptoms.  You have difficulty walking or doing everyday activities.  You have pain that gets worse or does not get better with medicine.  You   develop red skin or a feeling of warmth in your hip area. Get help right away if:  You cannot move your hip.  You have severe pain. This information is not intended to replace advice given to you by your health care provider. Make sure you discuss any questions you have with your health care provider. Document Released: 04/26/2002 Document Revised: 04/11/2016 Document Reviewed: 06/06/2015 Elsevier Interactive Patient Education  2018 Elsevier Inc.  

## 2018-10-21 NOTE — Progress Notes (Signed)
NEW PATIENT OFFICE VISIT  Chief Complaint  Patient presents with  . Hip Pain    Bilateral    37 years old history of lower back pain worked up with MRI has had injections in her back presents with 3 to 38-month history of pain over the greater trochanters and ischial tuberosities.  She was sent over for evaluation of the ischial tuberosities and bursitis but that has gotten better after oral prednisone however she still has pain over the greater trochanters left greater than right with pain increasing when she lies on her left her right side making it very uncomfortable for her.  She reports the pain is severe she reports it is a dull ache location as described.  This is not associated with any neurologic symptoms   Review of Systems  Musculoskeletal: Positive for back pain.  Neurological: Negative for sensory change, focal weakness and weakness.     Past Medical History:  Diagnosis Date  . Anemia   . Anxiety   . Back pain   . Bell's palsy   . Contraceptive management 11/30/2015  . DDD (degenerative disc disease)   . DDD (degenerative disc disease)   . Degenerative disc disease   . Fibromyalgia   . Fibromyalgia   . History of kidney stones   . Hypertension   . 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[place])   . Myositis   . Palpitations   . PVC (premature ventricular contraction)   . Seizures ([place])    unknown etiolgy; no meds and no seizures for 2 years.  . Smoker 11/30/2015  . Spondylosis     Past Surgical History:  Procedure Laterality Date  . DILATION AND CURETTAGE OF UTERUS  1998  . DILITATION & CURRETTAGE/HYSTROSCOPY WITH NOVASURE ABLATION N/A 01/16/2016   Procedure: HYSTEROSCOPY WITH NOVASURE ABLATION;  Surgeon: Jonnie Kind, MD;  Location: [facility];  Service: Gynecology;  Laterality: N/A;  Uterine Cavity Length 4.0 cm Uterine Cavity Width 3.3cm Power 73  Time 1 minute 17seconds  . LAPAROSCOPIC BILATERAL SALPINGECTOMY Bilateral 01/16/2016   Procedure: LAPAROSCOPIC BILATERAL SALPINGECTOMY;   Surgeon: Jonnie Kind, MD;  Location: [facility];  Service: Gynecology;  Laterality: Bilateral;  procedure 1  . OOPHORECTOMY  2006   Left side    Family History  Problem Relation Age of Onset  . Heart disease Father   . Kidney disease Father   . Alcohol abuse Brother   . Mental retardation Brother   . Schizophrenia Brother   . Colon cancer Maternal Grandmother   . Other Brother        back problems   Social History   Tobacco Use  . Smoking status: Former Smoker    Packs/day: 1.00    Years: 20.00    Pack years: 20.00    Types: Cigarettes    Last attempt to quit: 09/12/2016    Years since quitting: 2.1  . Smokeless tobacco: Never Used  Substance Use Topics  . Alcohol use: No  . Drug use: No    Allergies  Allergen Reactions  . Shrimp [Shellfish Allergy] Anaphylaxis, Hives and Swelling  . Other     Pt states "I carry an Epipen. I had anaphylactic reaction to something but I don't know what".     Current Meds  Medication Sig  . celecoxib (CELEBREX) 100 MG capsule TAKE 1 CAPSULE BY MOUTH TWICE DAILY  . topiramate (TOPAMAX) 25 MG tablet Take 2 tablets (50 mg total) by mouth 2 (two) times daily. (Patient taking differently: Take 25  mg by mouth 2 (two) times daily. )  . traMADol (ULTRAM) 50 MG tablet TAKE 1 TABLET BY MOUTH EVERY 8 HOURS AS NEEDED    BP (!) 146/96   Pulse 83   Ht 5\' 1"  (1.549 m)   Wt 136 lb (61.7 kg)   BMI 25.70 kg/m   Physical Exam  Constitutional: She is oriented to person, place, and time. She appears well-developed and well-nourished.  Neurological: She is alert and oriented to person, place, and time. Gait normal.  Psychiatric: She has a normal mood and affect. Judgment normal.  Vitals reviewed.   Right Hip Exam  Right hip exam is normal.   Tenderness  The patient is experiencing tenderness in the greater trochanter.  Range of Motion  The patient has normal right hip ROM.  Muscle Strength  The patient has normal right hip  strength.  Tests  FABER: negative  Other  Erythema: absent Sensation: normal Pulse: present  Comments:  HIP STABILITY NORMAL    Left Hip Exam  Left hip exam is normal.  Tenderness  The patient is experiencing tenderness in the greater trochanter.  Range of Motion  The patient has normal left hip ROM.  Muscle Strength  The patient has normal left hip strength.   Tests  FABER: negative  Other  Erythema: absent Sensation: normal Pulse: present  Comments:  HIP STABILITY NORMAL         MEDICAL DECISION SECTION  Xrays were done at [facility]  My independent reading of xrays:  NORMAL PELVIS   Encounter Diagnoses  Name Primary?  . Hip pain, bilateral Yes  . Trochanteric bursitis of both hips     PLAN: (Rx., injectx, surgery, frx, mri/ct) Procedure note injection for   RIGHT AND LEFT  hip bursitis  Verbal consent was obtained for injection of the  RIGHT  hip  Timeout was completed to confirm the injection site  The medications used were 40 mg of Depo-Medrol and 1% lidocaine 3 cc  Anesthesia was provided by ethyl chloride and the skin was prepped with alcohol.  After cleaning the skin with alcohol a 25-gauge needle was used to inject the  RT bursa of the hip  Procedure note injection for   LEFT hip bursitis  Verbal consent was obtained for injection of the  LEFT hip  Timeout was completed to confirm the injection site  The medications used were 40 mg of Depo-Medrol and 1% lidocaine 3 cc  Anesthesia was provided by ethyl chloride and the skin was prepped with alcohol.  After cleaning the skin with alcohol a 25-gauge needle was used to inject the  LEFT bursa of the hip  No orders of the defined types were placed in this encounter.   Arther Abbott, MD  10/21/2018 2:51 PM

## 2018-11-13 ENCOUNTER — Ambulatory Visit: Payer: Medicare Other | Admitting: Family Medicine

## 2018-11-19 ENCOUNTER — Other Ambulatory Visit: Payer: Self-pay | Admitting: Family Medicine

## 2018-11-19 NOTE — Telephone Encounter (Signed)
Ok to refill??  Last office visit 09/17/2018.  Last refill 10/14/2018.

## 2019-01-29 ENCOUNTER — Other Ambulatory Visit: Payer: Self-pay | Admitting: Family Medicine

## 2019-01-29 ENCOUNTER — Other Ambulatory Visit: Payer: Self-pay | Admitting: *Deleted

## 2019-01-29 MED ORDER — TRAMADOL HCL 50 MG PO TABS: 50.0000 mg | ORAL_TABLET | Freq: Three times a day (TID) | ORAL | 1 refills | Status: DC | PRN

## 2019-01-29 NOTE — Telephone Encounter (Signed)
Received fax requesting refill on Tramadol.   Ok to refill??  Last office visit 09/17/2018.  Last refill 11/19/2018, #1 refill.

## 2019-02-19 ENCOUNTER — Telehealth: Payer: Self-pay | Admitting: Family Medicine

## 2019-02-19 NOTE — Telephone Encounter (Signed)
Pt called and states that she had stopped her Topamax for a while and now her HA's are back and wanted to know if she could restart it?

## 2019-02-22 ENCOUNTER — Other Ambulatory Visit: Payer: Self-pay | Admitting: Family Medicine

## 2019-02-22 MED ORDER — TOPIRAMATE 25 MG PO TABS: 50.0000 mg | ORAL_TABLET | Freq: Two times a day (BID) | ORAL | 0 refills | Status: DC

## 2019-02-22 NOTE — Telephone Encounter (Signed)
Patient aware of providers recommendations and med sent to pharm 

## 2019-02-22 NOTE — Telephone Encounter (Signed)
Yes.  Start at 25 mg poqhs and gradually wean up to 50 mg pobid by increasing by 25 mg a week 25 qhs 1 week 25 bid 2 week 25 am 50 pm week 3 50 bid week 4.

## 2019-04-02 ENCOUNTER — Other Ambulatory Visit: Payer: Self-pay | Admitting: Family Medicine

## 2019-04-02 NOTE — Telephone Encounter (Signed)
Ok to refill??  Last office visit 09/17/2018.  Last refill 01/29/2019.

## 2019-04-08 ENCOUNTER — Telehealth: Payer: Self-pay | Admitting: Orthopedic Surgery

## 2019-04-08 NOTE — Telephone Encounter (Signed)
Patient left message on voicemail asking for an appointment. States she has seen Dr. Aline Brochure for bilateral hip pain before and she needs to see him again to get injections. I called back to speak with her and left message to call office.

## 2019-04-09 ENCOUNTER — Encounter: Payer: Self-pay | Admitting: Family Medicine

## 2019-04-09 ENCOUNTER — Ambulatory Visit (INDEPENDENT_AMBULATORY_CARE_PROVIDER_SITE_OTHER): Payer: Medicare Other | Admitting: Family Medicine

## 2019-04-09 ENCOUNTER — Other Ambulatory Visit: Payer: Self-pay

## 2019-04-09 VITALS — BP 140/100 | HR 98 | Temp 98.7°F | Resp 14 | Ht 62.0 in | Wt 129.0 lb

## 2019-04-09 DIAGNOSIS — M7062 Trochanteric bursitis, left hip: Secondary | ICD-10-CM | POA: Diagnosis not present

## 2019-04-09 DIAGNOSIS — M7061 Trochanteric bursitis, right hip: Secondary | ICD-10-CM | POA: Diagnosis not present

## 2019-04-09 NOTE — Progress Notes (Signed)
Subjective:    Patient ID: Theresa Washington, female    DOB: 10/09/1981, 38 y.o.   MRN: [id number]  Patient is a 38 year old Caucasian female who presents with a two-week history of bilateral hip pain.  The pain is located over the greater trochanter bilaterally.  It is tender to touch on both sides over the greater trochanter.  It hurts to lay on either side.  She has no pain with hip flexion or hip extension on either side.  She has no pain with internal or external rotation however she is tender to palpation over the greater trochanter on both sides.  She denies any numbness or tingling radiating down her legs.  She denies any weakness in her legs.  She denies any falls or injuries. Past Medical History:  Diagnosis Date  . Anemia   . Anxiety   . Back pain   . Bell's palsy   . Contraceptive management 11/30/2015  . DDD (degenerative disc disease)   . DDD (degenerative disc disease)   . Degenerative disc disease   . Fibromyalgia   . Fibromyalgia   . History of kidney stones   . Hypertension   . 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[place])   . Myositis   . Palpitations   . PVC (premature ventricular contraction)   . Seizures ([place])    unknown etiolgy; no meds and no seizures for 2 years.  . Smoker 11/30/2015  . Spondylosis    Past Surgical History:  Procedure Laterality Date  . DILATION AND CURETTAGE OF UTERUS  1998  . DILITATION & CURRETTAGE/HYSTROSCOPY WITH NOVASURE ABLATION N/A 01/16/2016   Procedure: HYSTEROSCOPY WITH NOVASURE ABLATION;  Surgeon: Jonnie Kind, MD;  Location: [facility];  Service: Gynecology;  Laterality: N/A;  Uterine Cavity Length 4.0 cm Uterine Cavity Width 3.3cm Power 73  Time 1 minute 17seconds  . LAPAROSCOPIC BILATERAL SALPINGECTOMY Bilateral 01/16/2016   Procedure: LAPAROSCOPIC BILATERAL SALPINGECTOMY;  Surgeon: Jonnie Kind, MD;  Location: [facility];  Service: Gynecology;  Laterality: Bilateral;  procedure 1  . OOPHORECTOMY  2006   Left side   Current Outpatient Medications  on File Prior to Visit  Medication Sig Dispense Refill  . celecoxib (CELEBREX) 100 MG capsule TAKE 1 CAPSULE BY MOUTH TWICE DAILY 60 capsule 3  . methylcellulose (ARTIFICIAL TEARS) 1 % ophthalmic solution Place 1 drop into both eyes daily as needed. Dry Eyes    . topiramate (TOPAMAX) 25 MG tablet Take 2 tablets (50 mg total) by mouth 2 (two) times daily. 120 tablet 3  . traMADol (ULTRAM) 50 MG tablet TAKE 1 TABLET(50 MG) BY MOUTH EVERY 8 HOURS AS NEEDED 60 tablet 0   No current facility-administered medications on file prior to visit.    Allergies  Allergen Reactions  . Shrimp [Shellfish Allergy] Anaphylaxis, Hives and Swelling  . Other     Pt states "I carry an Epipen. I had anaphylactic reaction to something but I don't know what".    Social History   Socioeconomic History  . Marital status: Married    Spouse name: Not on file  . Number of children: Not on file  . Years of education: Not on file  . Highest education level: Not on file  Occupational History  . Not on file  Social Needs  . Financial resource strain: Not on file  . Food insecurity:    Worry: Not on file    Inability: Not on file  . Transportation needs:    Medical: Not on file  Non-medical: Not on file  Tobacco Use  . Smoking status: Former Smoker    Packs/day: 1.00    Years: 20.00    Pack years: 20.00    Types: Cigarettes    Last attempt to quit: 09/12/2016    Years since quitting: 2.5  . Smokeless tobacco: Never Used  Substance and Sexual Activity  . Alcohol use: No  . Drug use: No  . Sexual activity: Yes    Birth control/protection: Surgical  Lifestyle  . Physical activity:    Days per week: Not on file    Minutes per session: Not on file  . Stress: Not on file  Relationships  . Social connections:    Talks on phone: Not on file    Gets together: Not on file    Attends religious service: Not on file    Active member of club or organization: Not on file    Attends meetings of clubs or  organizations: Not on file    Relationship status: Not on file  . Intimate partner violence:    Fear of current or ex partner: Not on file    Emotionally abused: Not on file    Physically abused: Not on file    Forced sexual activity: Not on file  Other Topics Concern  . Not on file  Social History Narrative  . Not on file       Review of Systems  Musculoskeletal: Positive for back pain.  All other systems reviewed and are negative.      Objective:   Physical Exam  Cardiovascular: Normal rate, regular rhythm and normal heart sounds.  Pulmonary/Chest: Effort normal and breath sounds normal. No respiratory distress. She has no wheezes. She has no rales.  Abdominal: Soft. Bowel sounds are normal.  Musculoskeletal:     Right hip: She exhibits tenderness and bony tenderness. She exhibits normal range of motion and normal strength.     Left hip: She exhibits tenderness and bony tenderness. She exhibits normal range of motion and normal strength.     Lumbar back: She exhibits normal range of motion, no tenderness and no bony tenderness.  Vitals reviewed.  Negative straight leg raise. Muscle strength is 5 over 5 equal and symmetric in both legs. She has normal 2 over 4 reflexes at the knee and Achilles bilaterally Patient is tender to palpation over the greater trochanter in both hips suggesting bursitis       Assessment & Plan:  Greater trochanteric bursitis of both hips  Patient agrees to receive a cortisone injection.  Point of maximum tenderness was found over the greater trochanter of the right hip.  That area was prepped with Betadine.  Using sterile technique the bursa was injected with 1 cc of 0.1% lidocaine without epinephrine and 1 cc of 40 mg/mL Kenalog.  Patient tolerated the procedure well without complication.  She then turned to her other side.  The point of maximum tenderness was located by palpation over the left greater trochanter.  The area was prepped with  Betadine.  Using sterile technique the bursa was injected with 1 cc of 0.1% lidocaine without epinephrine and 1 cc of 40 mg/mL Kenalog.  A chaperone was present for the entire time.  The patient tolerated procedures well without complication.

## 2019-04-13 ENCOUNTER — Telehealth: Payer: Self-pay | Admitting: Orthopedic Surgery

## 2019-04-13 NOTE — Telephone Encounter (Signed)
Patient called to relay that she was unable to wait for appointment for 04/14/19 for bilateral hip pain and swelling; states went to her primary care, Dr Dennard Schaumann at [facility]. States he "injected both hips with cortisone." Said her right hip is much better, but left hip is still hurting. Asking if she should still come to the appointment tomorrow with Dr Aline Brochure. Please review notes and please advise. Ph# [phone]

## 2019-04-14 ENCOUNTER — Ambulatory Visit: Payer: Medicare Other | Admitting: Orthopedic Surgery

## 2019-04-14 NOTE — Telephone Encounter (Signed)
Per Dr Aline Brochure, glad to see patient today - it is up to her. Discussed with patient; said will give it a little more time. Re-scheduled; aware of appointment.

## 2019-04-26 ENCOUNTER — Other Ambulatory Visit: Payer: Self-pay

## 2019-04-26 ENCOUNTER — Ambulatory Visit (INDEPENDENT_AMBULATORY_CARE_PROVIDER_SITE_OTHER): Payer: Medicare Other | Admitting: Orthopedic Surgery

## 2019-04-26 ENCOUNTER — Encounter: Payer: Self-pay | Admitting: Orthopedic Surgery

## 2019-04-26 VITALS — BP 144/98 | HR 73 | Temp 98.1°F | Ht 62.0 in | Wt 125.0 lb

## 2019-04-26 DIAGNOSIS — M5410 Radiculopathy, site unspecified: Secondary | ICD-10-CM

## 2019-04-26 NOTE — Progress Notes (Signed)
Chief Complaint  Patient presents with  . Hip Pain    Recheck on bilateral hip pain.    39 year old female history of bursitis received injections in her hip presents with a recurrent hip pain slightly posterior to the greater trochanter and in the left buttock.  She has a history of epidural injections secondary to degenerative disc disease and has trouble sitting driving   Review of Systems  Neurological: Negative for weakness.   Past Medical History:  Diagnosis Date  . Anemia   . Anxiety   . Back pain   . Bell's palsy   . Contraceptive management 11/30/2015  . DDD (degenerative disc disease)   . DDD (degenerative disc disease)   . Degenerative disc disease   . Fibromyalgia   . Fibromyalgia   . History of kidney stones   . Hypertension   . 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[place])   . Myositis   . Palpitations   . PVC (premature ventricular contraction)   . Seizures ([place])    unknown etiolgy; no meds and no seizures for 2 years.  . Smoker 11/30/2015  . Spondylosis    BP (!) 144/98   Pulse 73   Ht 5\' 2"  (1.575 m)   Wt 125 lb (56.7 kg)   BMI 22.86 kg/m  Physical Exam Vitals signs and nursing note reviewed.  Constitutional:      Appearance: Normal appearance.  Musculoskeletal:       Legs:  Neurological:     Mental Status: She is alert and oriented to person, place, and time.  Psychiatric:        Mood and Affect: Mood normal.    Encounter Diagnosis  Name Primary?  . Radicular leg pain-left Yes    Referred back for epidural injection last MRI was less than 3 years ago

## 2019-04-27 ENCOUNTER — Other Ambulatory Visit: Payer: Self-pay | Admitting: Orthopedic Surgery

## 2019-04-27 DIAGNOSIS — M5410 Radiculopathy, site unspecified: Secondary | ICD-10-CM

## 2019-05-01 ENCOUNTER — Other Ambulatory Visit: Payer: Self-pay | Admitting: Family Medicine

## 2019-05-03 NOTE — Telephone Encounter (Signed)
Ok to refill??  Last office visit 04/09/2019.  Last refill 04/02/2019.

## 2019-05-13 ENCOUNTER — Ambulatory Visit
Admission: RE | Admit: 2019-05-13 | Discharge: 2019-05-13 | Disposition: A | Discharge status: Home / Self Care | Payer: Medicare Other | Source: Ambulatory Visit | Attending: Orthopedic Surgery | Admitting: Orthopedic Surgery

## 2019-05-13 ENCOUNTER — Other Ambulatory Visit: Payer: Self-pay

## 2019-05-13 DIAGNOSIS — M5410 Radiculopathy, site unspecified: Secondary | ICD-10-CM

## 2019-05-13 DIAGNOSIS — M545 Low back pain: Secondary | ICD-10-CM | POA: Diagnosis not present

## 2019-05-13 DIAGNOSIS — M79605 Pain in left leg: Secondary | ICD-10-CM | POA: Diagnosis not present

## 2019-05-13 MED ORDER — METHYLPREDNISOLONE ACETATE 40 MG/ML INJ SUSP (RADIOLOG
120.0000 mg | Freq: Once | INTRAMUSCULAR | Status: AC
  Administered 2019-05-13: 120 mg via EPIDURAL

## 2019-05-13 MED ORDER — IOPAMIDOL (ISOVUE-M 200) INJECTION 41%
10.0000 mL | Freq: Once | INTRAMUSCULAR | Status: AC
  Administered 2019-05-13: 10 mL via EPIDURAL

## 2019-05-13 NOTE — Discharge Instructions (Signed)

## 2019-06-08 ENCOUNTER — Other Ambulatory Visit: Payer: Self-pay | Admitting: Family Medicine

## 2019-06-09 NOTE — Telephone Encounter (Signed)
Requesting refill    Tramadol  LOV: 04/09/19  LRF:  05/03/19

## 2019-06-16 ENCOUNTER — Ambulatory Visit (INDEPENDENT_AMBULATORY_CARE_PROVIDER_SITE_OTHER): Payer: Medicare Other | Admitting: Orthopedic Surgery

## 2019-06-16 ENCOUNTER — Other Ambulatory Visit: Payer: Self-pay

## 2019-06-16 ENCOUNTER — Encounter

## 2019-06-16 ENCOUNTER — Encounter: Payer: Self-pay | Admitting: Orthopedic Surgery

## 2019-06-16 VITALS — BP 133/84 | HR 81 | Ht 62.0 in | Wt 125.0 lb

## 2019-06-16 DIAGNOSIS — M7062 Trochanteric bursitis, left hip: Secondary | ICD-10-CM

## 2019-06-16 DIAGNOSIS — M7061 Trochanteric bursitis, right hip: Secondary | ICD-10-CM | POA: Diagnosis not present

## 2019-06-16 NOTE — Patient Instructions (Signed)

## 2019-06-16 NOTE — Progress Notes (Addendum)
Chief Complaint  Patient presents with  . Hip Pain    bilateral, wants injections at trochanter feels popping in right hip     She is very concerned that her hip might be coming out  So I had her walk down the hall and she had a slight limp on the right side  I examined both hips  The leg lengths are equal push pull test were normal posterior subluxation test was normal Stinchfield test was normal she did have tenderness over both greater trochanters and pain with adduction of the hip  She was stable in adduction as well   Procedure note injection for right hip bursitis  Verbal consent was obtained for injection of the right hip  Timeout was completed to confirm the injection site  The medications used were 40 mg of Depo-Medrol and 1% lidocaine 3 cc  Anesthesia was provided by ethyl chloride and the skin was prepped with alcohol.  After cleaning the skin with alcohol a 25-gauge needle was used to inject the greater trochanteric bursa right hip   No complications were noted.  Procedure note injection for left hip bursitis  Verbal consent was obtained for injection of the  left hip   Timeout was completed to confirm the injection site  The medications used were 40 mg of Depo-Medrol and 1% lidocaine 3 cc  Anesthesia was provided by ethyl chloride and the skin was prepped with alcohol.  After cleaning the skin with alcohol a 25-gauge needle was used to inject the left hip greater trochanteric bursa   Encounter Diagnosis  Name Primary?  . Trochanteric bursitis of both hips Yes

## 2019-06-17 ENCOUNTER — Other Ambulatory Visit: Payer: Self-pay | Admitting: Family Medicine

## 2019-06-29 ENCOUNTER — Other Ambulatory Visit: Payer: Medicare Other | Admitting: Adult Health

## 2019-07-14 ENCOUNTER — Other Ambulatory Visit: Payer: Self-pay | Admitting: Family Medicine

## 2019-07-15 NOTE — Telephone Encounter (Signed)
Ok to refill??  Last office visit 04/09/2019.  Last refill 06/10/2019.

## 2019-07-29 ENCOUNTER — Ambulatory Visit (INDEPENDENT_AMBULATORY_CARE_PROVIDER_SITE_OTHER): Payer: Medicare Other | Admitting: Advanced Practice Midwife

## 2019-07-29 ENCOUNTER — Encounter: Payer: Self-pay | Admitting: Advanced Practice Midwife

## 2019-07-29 ENCOUNTER — Other Ambulatory Visit (HOSPITAL_COMMUNITY)
Admission: RE | Admit: 2019-07-29 | Discharge: 2019-07-29 | Disposition: A | Discharge status: Home / Self Care | Payer: Medicare Other | Source: Ambulatory Visit | Attending: Adult Health | Admitting: Adult Health

## 2019-07-29 ENCOUNTER — Other Ambulatory Visit: Payer: Self-pay

## 2019-07-29 VITALS — BP 138/81 | HR 83 | Ht 61.0 in | Wt 123.0 lb

## 2019-07-29 DIAGNOSIS — Z124 Encounter for screening for malignant neoplasm of cervix: Secondary | ICD-10-CM | POA: Diagnosis not present

## 2019-07-29 DIAGNOSIS — Z01419 Encounter for gynecological examination (general) (routine) without abnormal findings: Secondary | ICD-10-CM | POA: Diagnosis not present

## 2019-07-29 DIAGNOSIS — N6320 Unspecified lump in the left breast, unspecified quadrant: Secondary | ICD-10-CM

## 2019-07-29 DIAGNOSIS — Z1151 Encounter for screening for human papillomavirus (HPV): Secondary | ICD-10-CM | POA: Insufficient documentation

## 2019-07-29 NOTE — Progress Notes (Signed)
[name] 38 y.o.  Vitals:   07/29/19 1451  BP: 138/81  Pulse: 83     Filed Weights   07/29/19 1451  Weight: 123 lb (55.8 kg)    Past Medical History: Past Medical History:  Diagnosis Date  . Anemia   . Anxiety   . Back pain   . Bell's palsy   . Contraceptive management 11/30/2015  . DDD (degenerative disc disease)   . DDD (degenerative disc disease)   . Degenerative disc disease   . Fibromyalgia   . Fibromyalgia   . History of kidney stones   . Hypertension   . 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[place])   . Myositis   . Palpitations   . PVC (premature ventricular contraction)   . Scoliosis   . Seizures ([place])    unknown etiolgy; no meds and no seizures for 2 years.  . Smoker 11/30/2015  . Spondylosis     Past Surgical History: Past Surgical History:  Procedure Laterality Date  . DILATION AND CURETTAGE OF UTERUS  1998  . DILITATION & CURRETTAGE/HYSTROSCOPY WITH NOVASURE ABLATION N/A 01/16/2016   Procedure: HYSTEROSCOPY WITH NOVASURE ABLATION;  Surgeon: Jonnie Kind, MD;  Location: [facility];  Service: Gynecology;  Laterality: N/A;  Uterine Cavity Length 4.0 cm Uterine Cavity Width 3.3cm Power 73  Time 1 minute 17seconds  . LAPAROSCOPIC BILATERAL SALPINGECTOMY Bilateral 01/16/2016   Procedure: LAPAROSCOPIC BILATERAL SALPINGECTOMY;  Surgeon: Jonnie Kind, MD;  Location: [facility];  Service: Gynecology;  Laterality: Bilateral;  procedure 1  . OOPHORECTOMY  2006   Left side    Family History: Family History  Problem Relation Age of Onset  . Heart disease Father   . Kidney disease Father   . Alcohol abuse Brother   . Mental retardation Brother   . Schizophrenia Brother   . Colon cancer Maternal Grandmother   . Multiple sclerosis Mother   . Other Brother        back problems    Social History: Social History   Tobacco Use  . Smoking status: Former Smoker    Packs/day: 1.00    Years: 20.00    Pack years: 20.00    Types: Cigarettes    Quit date: 09/12/2016   Years since quitting: 2.8  . Smokeless tobacco: Never Used  Substance Use Topics  . Alcohol use: No  . Drug use: No    Allergies:  Allergies  Allergen Reactions  . Shrimp [Shellfish Allergy] Anaphylaxis, Hives and Swelling  . Other     Pt states "I carry an Epipen. I had anaphylactic reaction to something but I don't know what".       Current Outpatient Medications:  .  celecoxib (CELEBREX) 100 MG capsule, TAKE 1 CAPSULE BY MOUTH TWICE DAILY, Disp: 60 capsule, Rfl: 3 .  diclofenac sodium (VOLTAREN) 1 % GEL, Apply topically as needed., Disp: , Rfl:  .  methylcellulose (ARTIFICIAL TEARS) 1 % ophthalmic solution, Place 1 drop into both eyes daily as needed. Dry Eyes, Disp: , Rfl:  .  topiramate (TOPAMAX) 25 MG tablet, Take 2 tablets (50 mg total) by mouth 2 (two) times daily., Disp: 120 tablet, Rfl: 3 .  traMADol (ULTRAM) 50 MG tablet, TAKE 1 TABLET(50 MG) BY MOUTH EVERY 8 HOURS AS NEEDED, Disp: 60 tablet, Rfl: 0  History of Present Illness: here for pap. Last pap normal, 2017. Had BSO/ablation a few years ago.   never bleeds.     Review of Systems   Patient denies any  headaches, blurred vision, shortness of breath, chest pain, abdominal pain, problems with bowel movements, urination, or intercourse.   Physical Exam: General:  Well developed, well nourished, no acute distress Skin:  Warm and dry Neck:  Midline trachea, normal thyroid Lungs; Clear to auscultation bilaterally Breast: Right breast: No dominant palpable mass, retraction, or nipple discharge  Left Breast:  Pt found small nodules (2-3) 3 weeks ago; small palpable, mobile, non tender nodules near armpit but seemingly in breast tissue, not lymph.  Cardiovascular: Regular rate and rhythm Abdomen:  Soft, non tender, no hepatosplenomegaly Pelvic:  External genitalia is normal in appearance.  The vagina is normal in appearance.  The cervix is bulbous.  Uterus is felt to be normal size, shape, and contour.  No adnexal masses  or tenderness noted.  Extremities:  No swelling or varicosities noted Psych:  No mood changes.     Impression: Normal GYN exam  Small left breast nodules; US/mammo scheduled for 08/17/19 at 3 pm   Plan: if pap normal, repeat in 3 years Has PCP to manage other problems.

## 2019-07-29 NOTE — Patient Instructions (Addendum)
[facility] Radiology September 29 at 3 PM  [phone]

## 2019-08-02 LAB — CYTOLOGY - PAP
Chlamydia: NEGATIVE
Diagnosis: NEGATIVE
HPV: NOT DETECTED
Neisseria Gonorrhea: NEGATIVE

## 2019-08-17 ENCOUNTER — Other Ambulatory Visit (HOSPITAL_COMMUNITY): Payer: Medicare Other

## 2019-08-17 ENCOUNTER — Inpatient Hospital Stay (HOSPITAL_COMMUNITY): Admission: RE | Admit: 2019-08-17 | Payer: Medicare Other | Source: Ambulatory Visit

## 2019-08-17 ENCOUNTER — Ambulatory Visit (HOSPITAL_COMMUNITY): Payer: Medicare Other

## 2019-08-21 ENCOUNTER — Other Ambulatory Visit: Payer: Self-pay | Admitting: Family Medicine

## 2019-08-23 NOTE — Telephone Encounter (Signed)
Requesting refill    Tramadol  LOV:  04/09/19  LRF:  07/15/19

## 2019-08-24 ENCOUNTER — Ambulatory Visit (HOSPITAL_COMMUNITY): Payer: Medicare Other

## 2019-08-24 ENCOUNTER — Ambulatory Visit (HOSPITAL_COMMUNITY)
Admission: RE | Admit: 2019-08-24 | Discharge: 2019-08-24 | Disposition: A | Discharge status: Home / Self Care | Payer: Medicare Other | Source: Ambulatory Visit | Attending: Advanced Practice Midwife | Admitting: Advanced Practice Midwife

## 2019-08-24 ENCOUNTER — Other Ambulatory Visit: Payer: Self-pay

## 2019-08-24 DIAGNOSIS — N6320 Unspecified lump in the left breast, unspecified quadrant: Secondary | ICD-10-CM

## 2019-08-24 DIAGNOSIS — R922 Inconclusive mammogram: Secondary | ICD-10-CM | POA: Diagnosis not present

## 2019-08-24 DIAGNOSIS — N6489 Other specified disorders of breast: Secondary | ICD-10-CM | POA: Diagnosis not present

## 2019-08-24 DIAGNOSIS — N6321 Unspecified lump in the left breast, upper outer quadrant: Secondary | ICD-10-CM | POA: Diagnosis not present

## 2019-08-27 ENCOUNTER — Other Ambulatory Visit: Payer: Self-pay | Admitting: Family Medicine

## 2019-09-10 ENCOUNTER — Other Ambulatory Visit: Payer: Self-pay

## 2019-09-10 ENCOUNTER — Ambulatory Visit (INDEPENDENT_AMBULATORY_CARE_PROVIDER_SITE_OTHER): Payer: Medicare Other | Admitting: Family Medicine

## 2019-09-10 VITALS — BP 120/70 | HR 88 | Temp 98.3°F | Resp 18 | Ht 61.0 in | Wt 122.0 lb

## 2019-09-10 DIAGNOSIS — R238 Other skin changes: Secondary | ICD-10-CM | POA: Diagnosis not present

## 2019-09-10 DIAGNOSIS — R233 Spontaneous ecchymoses: Secondary | ICD-10-CM
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
## 2019-09-11 LAB — COMPLETE METABOLIC PANEL WITH GFR
AG Ratio: 1.5 (calc) (ref 1.0–2.5)
ALT: 9 U/L (ref 6–29)
AST: 13 U/L (ref 10–30)
Albumin: 4.3 g/dL (ref 3.6–5.1)
Alkaline phosphatase (APISO): 48 U/L (ref 31–125)
BUN: 19 mg/dL (ref 7–25)
CO2: 20 mmol/L (ref 20–32)
Calcium: 9.3 mg/dL (ref 8.6–10.2)
Chloride: 107 mmol/L (ref 98–110)
Creat: 0.75 mg/dL (ref 0.50–1.10)
GFR, Est African American: 117 mL/min/{1.73_m2} (ref >=60)
GFR, Est Non African American: 101 mL/min/{1.73_m2} (ref >=60)
Globulin: 2.9 g/dL (calc) (ref 1.9–3.7)
Glucose, Bld: 87 mg/dL (ref 65–99)
Potassium: 4 mmol/L (ref 3.5–5.3)
Sodium: 138 mmol/L (ref 135–146)
Total Bilirubin: 0.4 mg/dL (ref 0.2–1.2)
Total Protein: 7.2 g/dL (ref 6.1–8.1)

## 2019-09-11 LAB — CBC WITH DIFFERENTIAL/PLATELET
Absolute Monocytes: 642 cells/uL (ref 200–950)
Basophils Absolute: 37 cells/uL (ref 0–200)
Basophils Relative: 0.4 %
Eosinophils Absolute: 28 cells/uL (ref 15–500)
Eosinophils Relative: 0.3 %
HCT: 39 % (ref 35.0–45.0)
Hemoglobin: 12.7 g/dL (ref 11.7–15.5)
Lymphs Abs: 2279 cells/uL (ref 850–3900)
MCH: 29.6 pg (ref 27.0–33.0)
MCHC: 32.6 g/dL (ref 32.0–36.0)
MCV: 90.9 fL (ref 80.0–100.0)
MPV: 10.3 fL (ref 7.5–12.5)
Monocytes Relative: 6.9 %
Neutro Abs: 6315 cells/uL (ref 1500–7800)
Neutrophils Relative %: 67.9 %
Platelets: 357 10*3/uL (ref 140–400)
RBC: 4.29 10*6/uL (ref 3.80–5.10)
RDW: 12.2 % (ref 11.0–15.0)
Total Lymphocyte: 24.5 %
WBC: 9.3 10*3/uL (ref 3.8–10.8)

## 2019-09-11 LAB — PROTIME-INR
INR: 1
Prothrombin Time: 10.3 s (ref 9.0–11.5)

## 2019-09-11 LAB — APTT: aPTT: 26 s (ref 23–32)

## 2019-09-14 LAB — VON WILLEBRAND PANEL
Factor-VIII Activity: 67 % normal (ref 50–180)
Ristocetin Co-Factor: 92 % normal (ref 42–200)
Von Willebrand Antigen, Plasma: 148 % (ref 50–217)
aPTT: 29 s (ref 22–34)

## 2019-10-04 ENCOUNTER — Other Ambulatory Visit: Payer: Self-pay

## 2019-10-04 ENCOUNTER — Ambulatory Visit: Payer: Medicare Other | Admitting: Orthopedic Surgery

## 2019-10-04 VITALS — BP 134/67 | HR 71 | Temp 98.2°F | Ht 61.0 in | Wt 117.0 lb

## 2019-10-04 DIAGNOSIS — M7062 Trochanteric bursitis, left hip: Secondary | ICD-10-CM

## 2019-10-04 DIAGNOSIS — M7061 Trochanteric bursitis, right hip: Secondary | ICD-10-CM | POA: Diagnosis not present

## 2019-10-04 NOTE — Progress Notes (Signed)
Chief Complaint  Patient presents with  . Follow-up    Recheck on left hip    Procedure note injection for right hip bursitis  Verbal consent was obtained for injection of the right hip  Timeout was completed to confirm the injection site  The medications used were 40 mg of Depo-Medrol and 1% lidocaine 3 cc  Anesthesia was provided by ethyl chloride and the skin was prepped with alcohol.  After cleaning the skin with alcohol a 25-gauge needle was used to inject the greater trochanteric bursa right hip   No complications were noted  Procedure note injection for left hip bursitis  Verbal consent was obtained for injection of the  left hip   Timeout was completed to confirm the injection site  The medications used were 40 mg of Depo-Medrol and 1% lidocaine 3 cc  Anesthesia was provided by ethyl chloride and the skin was prepped with alcohol.  After cleaning the skin with alcohol a 25-gauge needle was used to inject the left hip greater trochanteric bursa  Encounter Diagnosis  Name Primary?  . Trochanteric bursitis of both hips Yes

## 2019-10-04 NOTE — Patient Instructions (Signed)

## 2019-10-06 ENCOUNTER — Ambulatory Visit: Payer: Medicare Other | Admitting: Orthopedic Surgery

## 2019-10-13 DIAGNOSIS — M5412 Radiculopathy, cervical region: Secondary | ICD-10-CM | POA: Diagnosis not present

## 2019-10-26 ENCOUNTER — Other Ambulatory Visit: Payer: Self-pay | Admitting: Family Medicine

## 2019-10-26 MED ORDER — CELECOXIB 100 MG PO CAPS: 100.0000 mg | ORAL_CAPSULE | Freq: Two times a day (BID) | ORAL | 3 refills | Status: DC

## 2019-11-05 ENCOUNTER — Other Ambulatory Visit: Payer: Self-pay | Admitting: Family Medicine

## 2019-11-05 NOTE — Telephone Encounter (Signed)
Pt requesting refill on Tramadol      LOV:  09/10/19  LRF:   08/23/19

## 2019-11-19 HISTORY — PX: HIP SURGERY: SHX245

## 2019-11-29 ENCOUNTER — Ambulatory Visit: Payer: Medicare Other | Admitting: Orthopedic Surgery

## 2019-11-29 ENCOUNTER — Other Ambulatory Visit: Payer: Self-pay

## 2019-11-29 ENCOUNTER — Encounter: Payer: Self-pay | Admitting: Orthopedic Surgery

## 2019-11-29 VITALS — BP 135/82 | HR 107 | Ht 61.0 in | Wt 114.0 lb

## 2019-11-29 DIAGNOSIS — M25552 Pain in left hip: Secondary | ICD-10-CM | POA: Diagnosis not present

## 2019-11-29 DIAGNOSIS — M7061 Trochanteric bursitis, right hip: Secondary | ICD-10-CM

## 2019-11-29 DIAGNOSIS — S73192A Other sprain of left hip, initial encounter: Secondary | ICD-10-CM | POA: Diagnosis not present

## 2019-11-29 NOTE — Progress Notes (Signed)
Theresa Washington  11/29/2019  Body mass index is 21.54 kg/m.   HISTORY SECTION :  Chief Complaint  Patient presents with  . Hip Pain    left    Problem #1 patient complains of pain left hip for chronic bursitis.  She would like an injection.  Problem #2 she says the right hip is doing fine.  Problem #3 the patient says she has had 6 months of groin pain pain when turning her hip out and pain in the groin when she sitting for long periods of time    Review of Systems  Constitutional: Negative for chills and fever.  Neurological: Negative for sensory change.     has a past medical history of Anemia, Anxiety, Back pain, Bell's palsy, Contraceptive management (11/30/2015), DDD (degenerative disc disease), DDD (degenerative disc disease), Degenerative disc disease, Fibromyalgia, Fibromyalgia, History of kidney stones, Hypertension, 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[place]), Myositis, Palpitations, PVC (premature ventricular contraction), Scoliosis, Seizures ([place]), Smoker (11/30/2015), and Spondylosis.   Past Surgical History:  Procedure Laterality Date  . DILATION AND CURETTAGE OF UTERUS  1998  . DILITATION & CURRETTAGE/HYSTROSCOPY WITH NOVASURE ABLATION N/A 01/16/2016   Procedure: HYSTEROSCOPY WITH NOVASURE ABLATION;  Surgeon: Jonnie Kind, MD;  Location: [facility];  Service: Gynecology;  Laterality: N/A;  Uterine Cavity Length 4.0 cm Uterine Cavity Width 3.3cm Power 73  Time 1 minute 17seconds  . LAPAROSCOPIC BILATERAL SALPINGECTOMY Bilateral 01/16/2016   Procedure: LAPAROSCOPIC BILATERAL SALPINGECTOMY;  Surgeon: Jonnie Kind, MD;  Location: [facility];  Service: Gynecology;  Laterality: Bilateral;  procedure 1  . OOPHORECTOMY  2006   Left side    Body mass index is 21.54 kg/m.   Allergies  Allergen Reactions  . Shrimp [Shellfish Allergy] Anaphylaxis, Hives and Swelling  . Other     Pt states "I carry an Epipen. I had anaphylactic reaction to something but I don't know what".      Current  Outpatient Medications:  .  celecoxib (CELEBREX) 100 MG capsule, Take 1 capsule (100 mg total) by mouth 2 (two) times daily., Disp: 60 capsule, Rfl: 3 .  diclofenac sodium (VOLTAREN) 1 % GEL, Apply topically as needed., Disp: , Rfl:  .  methylcellulose (ARTIFICIAL TEARS) 1 % ophthalmic solution, Place 1 drop into both eyes daily as needed. Dry Eyes, Disp: , Rfl:  .  topiramate (TOPAMAX) 25 MG tablet, TAKE 2 TABLETS(50 MG) BY MOUTH TWICE DAILY, Disp: 360 tablet, Rfl: 1 .  traMADol (ULTRAM) 50 MG tablet, TAKE 1 TABLET(50 MG) BY MOUTH EVERY 8 HOURS AS NEEDED, Disp: 60 tablet, Rfl: 0   PHYSICAL EXAM SECTION: 1) BP 135/82   Pulse (!) 107   Ht 5\' 1"  (1.549 m)   Wt 114 lb (51.7 kg)   BMI 21.54 kg/m   Body mass index is 21.54 kg/m. General appearance: Well-developed well-nourished no gross deformities  2) Cardiovascular normal pulse and perfusion in the lower extremities with normal color no edema  3) Neurologically deep tendon reflexes are equal and normal, no sensation loss or deficits no pathologic reflexes  4) Psychological: Awake alert and oriented x3 mood and affect normal  5) Skin no lacerations or ulcerations no nodularity no palpable masses, no erythema or nodularity  6) Musculoskeletal:   Right hip skin is normal, neurovascular exam is intact.  Hip flexion is normal with negative flexion internal rotation abduction test  Left hip positive hip flexion abduction internal rotation test with painful hip flexion   MEDICAL DECISION SECTION:  Encounter Diagnoses  Name Primary?  . Pain in left hip   . Tear of left acetabular labrum, initial encounter Yes  . Trochanteric bursitis, right hip     Imaging X-rays of the patient's hip showed no fracture dislocation or bony abnormality.  This x-ray was done on the prior visit but was pulled and reread.  Plan:  (Rx., Inj., surg., Frx, MRI/CT, XR:2)  Recommend injection left greater trochanteric bursa Procedure note injection for  left hip bursitis  Verbal consent was obtained for injection of the  left hip   Timeout was completed to confirm the injection site  The medications used were 40 mg of Depo-Medrol and 1% lidocaine 3 cc  Anesthesia was provided by ethyl chloride and the skin was prepped with alcohol.  After cleaning the skin with alcohol a 25-gauge needle was used to inject the left hip greater trochanteric bursa   Recommend MRI with contrast of the left hip for labral tear   Trochanteric bursa right hip stable     12:06 PM Arther Abbott, MD  11/29/2019

## 2019-11-29 NOTE — Patient Instructions (Signed)
Problem number 1 injection left hip  Problem #2 possible labral tear recommend MRI with contrast left hip

## 2019-12-03 ENCOUNTER — Ambulatory Visit (INDEPENDENT_AMBULATORY_CARE_PROVIDER_SITE_OTHER): Payer: Medicare Other | Admitting: Family Medicine

## 2019-12-03 ENCOUNTER — Other Ambulatory Visit: Payer: Self-pay

## 2019-12-03 ENCOUNTER — Encounter: Payer: Self-pay | Admitting: Family Medicine

## 2019-12-03 VITALS — BP 142/82 | HR 90 | Temp 97.3°F | Resp 16 | Ht 62.0 in | Wt 118.0 lb

## 2019-12-03 DIAGNOSIS — S39012A Strain of muscle, fascia and tendon of lower back, initial encounter: Secondary | ICD-10-CM

## 2019-12-03 MED ORDER — HYDROCODONE-ACETAMINOPHEN 5-325 MG PO TABS: 1.0000 | ORAL_TABLET | Freq: Four times a day (QID) | ORAL | 0 refills | Status: DC | PRN

## 2019-12-03 MED ORDER — DIAZEPAM 10 MG PO TABS: 5.0000 mg | ORAL_TABLET | Freq: Two times a day (BID) | ORAL | 0 refills | Status: DC | PRN

## 2019-12-03 NOTE — Progress Notes (Signed)
Subjective:    Patient ID: Theresa Washington, female    DOB: [birth year], 39 y.o.   MRN: [id number] This morning the patient bent over to pick up a hair brush and felt a pop in her left lower back just above the iliac crest.  She now has pain and tenderness in that area.  She denies any bowel or bladder incontinence.  She denies any saddle anesthesia.  She denies any leg weakness.  She denies any hematuria or dysuria.  She is unable to sit down today due to the pain.  She is only comfortable if she is standing.  She is in tears leaning over the exam table.  Gentle palpation of the left lower back elicits pain.  Muscle strength is 5/5 equal and symmetric in both legs.  She has normal sensation in both legs.  Past Medical History:  Diagnosis Date  . Anemia   . Anxiety   . Back pain   . Bell's palsy   . Contraceptive management 11/30/2015  . DDD (degenerative disc disease)   . DDD (degenerative disc disease)   . Degenerative disc disease   . Fibromyalgia   . Fibromyalgia   . History of kidney stones   . Hypertension   . 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[place])   . Myositis   . Palpitations   . PVC (premature ventricular contraction)   . Scoliosis   . Seizures ([place])    unknown etiolgy; no meds and no seizures for 2 years.  . Smoker 11/30/2015  . Spondylosis    Past Surgical History:  Procedure Laterality Date  . DILATION AND CURETTAGE OF UTERUS  1998  . DILITATION & CURRETTAGE/HYSTROSCOPY WITH NOVASURE ABLATION N/A 01/16/2016   Procedure: HYSTEROSCOPY WITH NOVASURE ABLATION;  Surgeon: Jonnie Kind, MD;  Location: [facility];  Service: Gynecology;  Laterality: N/A;  Uterine Cavity Length 4.0 cm Uterine Cavity Width 3.3cm Power 73  Time 1 minute 17seconds  . LAPAROSCOPIC BILATERAL SALPINGECTOMY Bilateral 01/16/2016   Procedure: LAPAROSCOPIC BILATERAL SALPINGECTOMY;  Surgeon: Jonnie Kind, MD;  Location: [facility];  Service: Gynecology;  Laterality: Bilateral;  procedure 1  . OOPHORECTOMY  2006   Left  side   Current Outpatient Medications on File Prior to Visit  Medication Sig Dispense Refill  . celecoxib (CELEBREX) 100 MG capsule Take 1 capsule (100 mg total) by mouth 2 (two) times daily. 60 capsule 3  . diclofenac sodium (VOLTAREN) 1 % GEL Apply topically as needed.    . methylcellulose (ARTIFICIAL TEARS) 1 % ophthalmic solution Place 1 drop into both eyes daily as needed. Dry Eyes    . topiramate (TOPAMAX) 25 MG tablet TAKE 2 TABLETS(50 MG) BY MOUTH TWICE DAILY 360 tablet 1  . traMADol (ULTRAM) 50 MG tablet TAKE 1 TABLET(50 MG) BY MOUTH EVERY 8 HOURS AS NEEDED 60 tablet 0   No current facility-administered medications on file prior to visit.   Allergies  Allergen Reactions  . Shrimp [Shellfish Allergy] Anaphylaxis, Hives and Swelling  . Other     Pt states "I carry an Epipen. I had anaphylactic reaction to something but I don't know what".    Social History   Socioeconomic History  . Marital status: Married    Spouse name: Not on file  . Number of children: Not on file  . Years of education: Not on file  . Highest education level: Not on file  Occupational History  . Not on file  Tobacco Use  . Smoking status: Former Smoker  Packs/day: 1.00    Years: 20.00    Pack years: 20.00    Types: Cigarettes    Quit date: 09/12/2016    Years since quitting: 3.2  . Smokeless tobacco: Never Used  Substance and Sexual Activity  . Alcohol use: No  . Drug use: No  . Sexual activity: Yes    Birth control/protection: Surgical    Comment: tubal and ablation  Other Topics Concern  . Not on file  Social History Narrative  . Not on file   Social Determinants of Health   Financial Resource Strain:   . Difficulty of Paying Living Expenses: Not on file  Food Insecurity:   . Worried About Charity fundraiser in the Last Year: Not on file  . Ran Out of Food in the Last Year: Not on file  Transportation Needs:   . Lack of Transportation (Medical): Not on file  . Lack of  Transportation (Non-Medical): Not on file  Physical Activity:   . Days of Exercise per Week: Not on file  . Minutes of Exercise per Session: Not on file  Stress:   . Feeling of Stress : Not on file  Social Connections:   . Frequency of Communication with Friends and Family: Not on file  . Frequency of Social Gatherings with Friends and Family: Not on file  . Attends Religious Services: Not on file  . Active Member of Clubs or Organizations: Not on file  . Attends Archivist Meetings: Not on file  . Marital Status: Not on file  Intimate Partner Violence:   . Fear of Current or Ex-Partner: Not on file  . Emotionally Abused: Not on file  . Physically Abused: Not on file  . Sexually Abused: Not on file       Review of Systems  All other systems reviewed and are negative.      Objective:   Physical Exam  Constitutional: She appears well-developed and well-nourished. No distress.  Cardiovascular: Normal rate, regular rhythm and normal heart sounds.  Pulmonary/Chest: Effort normal and breath sounds normal. No respiratory distress. She has no wheezes. She has no rales.  Abdominal: Soft. Bowel sounds are normal. She exhibits no distension. There is no abdominal tenderness. There is no rebound and no guarding.  Musculoskeletal:     Lumbar back: Pain, spasms and tenderness present. No bony tenderness. Decreased range of motion.       Back:  Skin: She is not diaphoretic.  Vitals reviewed.       Assessment & Plan:  Strain of lumbar region, initial encounter  Patient's history and the location of pain suggest a pulled muscle when she bent over.  I do not feel that she herniated the disc.  Instead I think she tore a muscle in her lower back as she bent over and she is now having muscle spasms due to the injury.  She can use Valium 5 to 10 mg every 12 hours as needed for muscle spasms.  She can continue to take her Celebrex.  Discontinue tramadol.  If the Valium does not help  her pain, as she appears to be in significant pain, she can switch to hydrocodone 5/325 1 to 2 tablets every 6 hours as needed.  I cautioned the patient not to use Valium and hydrocodone together but instead use whichever 1 seems to alleviate the pain the most.  I feel that Valium will likely work better in this instance due to the fact she appears to be  having muscle spasms.  Patient understands and is able to repeat the plan back to me demonstrating understanding.

## 2019-12-14 ENCOUNTER — Ambulatory Visit (HOSPITAL_COMMUNITY)
Admission: RE | Admit: 2019-12-14 | Discharge: 2019-12-14 | Disposition: A | Discharge status: Home / Self Care | Payer: Medicare Other | Source: Ambulatory Visit | Attending: Orthopedic Surgery | Admitting: Orthopedic Surgery

## 2019-12-14 ENCOUNTER — Other Ambulatory Visit: Payer: Self-pay

## 2019-12-14 ENCOUNTER — Encounter (HOSPITAL_COMMUNITY): Payer: Self-pay

## 2019-12-14 DIAGNOSIS — G8929 Other chronic pain: Secondary | ICD-10-CM | POA: Diagnosis not present

## 2019-12-14 DIAGNOSIS — M25552 Pain in left hip: Secondary | ICD-10-CM | POA: Diagnosis not present

## 2019-12-14 MED ORDER — LIDOCAINE HCL (PF) 1 % IJ SOLN
INTRAMUSCULAR | Status: AC
  Administered 2019-12-14: 3 mL
  Filled 2019-12-14: qty 5

## 2019-12-14 MED ORDER — POVIDONE-IODINE 10 % EX SOLN
CUTANEOUS | Status: AC
  Filled 2019-12-14: qty 15

## 2019-12-14 MED ORDER — IOHEXOL 300 MG/ML  SOLN
50.0000 mL | Freq: Once | INTRAMUSCULAR | Status: AC | PRN
  Administered 2019-12-14: 10 mL

## 2019-12-14 MED ORDER — SODIUM CHLORIDE (PF) 0.9 % IJ SOLN
INTRAMUSCULAR | Status: AC
  Administered 2019-12-14: 10 mL
  Filled 2019-12-14: qty 10

## 2019-12-14 MED ORDER — GADOBUTROL 1 MMOL/ML IV SOLN
0.0500 mL | Freq: Once | INTRAVENOUS | Status: AC | PRN
  Administered 2019-12-14: 0.05 mL

## 2019-12-14 NOTE — Procedures (Signed)
Preprocedure Dx: Chronic LT hip pain for 4 months Postprocedure Dx: Chronic LT hip pain for 4 months Procedure  Fluoroscopically guided LT hip joint injection for MR arthrogrpahy Radiologist:  Thornton Papas Anesthesia:  3 ml of 1% lidocaine Injectate:  10 ml of standard arthrography solution mix (0.5 ml Gadovist, 10 ml sterile saline, 10 ml Omni 300) Fluoro time:  0 minutes 24 seconds EBL:   None Complications: None

## 2019-12-17 ENCOUNTER — Encounter: Payer: Self-pay | Admitting: Orthopedic Surgery

## 2019-12-17 ENCOUNTER — Other Ambulatory Visit: Payer: Self-pay

## 2019-12-17 ENCOUNTER — Ambulatory Visit: Payer: Medicare Other | Admitting: Orthopedic Surgery

## 2019-12-17 VITALS — BP 126/87 | HR 88 | Ht 62.0 in | Wt 118.0 lb

## 2019-12-17 DIAGNOSIS — M48061 Spinal stenosis, lumbar region without neurogenic claudication: Secondary | ICD-10-CM

## 2019-12-17 DIAGNOSIS — S73192D Other sprain of left hip, subsequent encounter: Secondary | ICD-10-CM | POA: Diagnosis not present

## 2019-12-17 NOTE — Patient Instructions (Signed)
Referral to Dr Aretha Parrot for hip labral tear  Call DR Dennard Schaumann for referral to Dr Ellene Route   Note for work M T W

## 2019-12-17 NOTE — Progress Notes (Signed)
Chief Complaint  Patient presents with  . Results    review MR Arthrogram  left hip  . Back Pain    increased back pain needs work note     Encounter Diagnoses  Name Primary?  . Tear of left acetabular labrum, subsequent encounter Yes  . Degenerative lumbar spinal stenosis     Theresa Washington comes in for MRI follow-up after arthrogram shows that she does have a labral tear in her left hip which probably correlates with her groin pain pain with hip flexion.  As we know she does have degenerative disc disease and has had an acute onset of back pain after reaching down to get something off of the floor.  She was treated by Dr. Dennard Schaumann but has been seen by Dr. Ellene Route in the past.  Since we did not handle degenerative disc problems in the back or neck we have asked her to call Dr. Dennard Schaumann to get him to send her back to Dr. Ellene Route for that.  I have gone over the report with her and advised her to see Dr. Aretha Parrot   IMPRESSION: Heterogeneous appearance of the anterior and anterosuperior labrum compatible with tear.   Electronically Signed   By: Davina Poke D.O.   On: 12/14/2019 13:27

## 2019-12-20 ENCOUNTER — Telehealth: Payer: Self-pay | Admitting: Family Medicine

## 2019-12-20 DIAGNOSIS — M5137 Other intervertebral disc degeneration, lumbosacral region: Secondary | ICD-10-CM

## 2019-12-20 NOTE — Telephone Encounter (Signed)
Ok if that's what ortho recommends.

## 2019-12-20 NOTE — Telephone Encounter (Signed)
Patient called in stating that she saw Dr. Aline Brochure and was noted to still have swelling in her lower back. Dr. Aline Brochure recommended that patient call us to request a referral to Neurosurgery to see Dr. Ellene Route. May I place referral?

## 2019-12-20 NOTE — Telephone Encounter (Signed)
Referral orders placed! Informed patient

## 2019-12-24 ENCOUNTER — Ambulatory Visit: Payer: Medicare Other | Admitting: Orthopedic Surgery

## 2019-12-27 DIAGNOSIS — M25552 Pain in left hip: Secondary | ICD-10-CM | POA: Diagnosis not present

## 2020-01-05 ENCOUNTER — Telehealth: Payer: Self-pay | Admitting: Family Medicine

## 2020-01-05 NOTE — Telephone Encounter (Signed)
Pt needs a letter for surgical clearance for hip surgery. Does she need an ov?  CB# - [phone]

## 2020-01-07 ENCOUNTER — Encounter: Payer: Self-pay | Admitting: Family Medicine

## 2020-01-07 NOTE — Telephone Encounter (Signed)
I printed letter

## 2020-01-07 NOTE — Telephone Encounter (Signed)
Tried to call - no answer and vm has not been set up

## 2020-01-11 ENCOUNTER — Encounter: Payer: Self-pay | Admitting: Family Medicine

## 2020-01-11 NOTE — Telephone Encounter (Signed)
Pt called and made aware to pick up letter

## 2020-01-12 DIAGNOSIS — M533 Sacrococcygeal disorders, not elsewhere classified: Secondary | ICD-10-CM | POA: Diagnosis not present

## 2020-01-13 ENCOUNTER — Ambulatory Visit: Payer: Medicare Other | Admitting: Family Medicine

## 2020-02-01 DIAGNOSIS — R262 Difficulty in walking, not elsewhere classified: Secondary | ICD-10-CM | POA: Diagnosis not present

## 2020-02-01 DIAGNOSIS — Z01812 Encounter for preprocedural laboratory examination: Secondary | ICD-10-CM | POA: Diagnosis not present

## 2020-02-01 DIAGNOSIS — M25552 Pain in left hip: Secondary | ICD-10-CM | POA: Diagnosis not present

## 2020-02-02 DIAGNOSIS — M25552 Pain in left hip: Secondary | ICD-10-CM | POA: Diagnosis not present

## 2020-02-02 DIAGNOSIS — R569 Unspecified convulsions: Secondary | ICD-10-CM | POA: Diagnosis not present

## 2020-02-02 DIAGNOSIS — Z7901 Long term (current) use of anticoagulants: Secondary | ICD-10-CM | POA: Diagnosis not present

## 2020-02-02 DIAGNOSIS — M65852 Other synovitis and tenosynovitis, left thigh: Secondary | ICD-10-CM | POA: Diagnosis not present

## 2020-02-02 DIAGNOSIS — Z79899 Other long term (current) drug therapy: Secondary | ICD-10-CM | POA: Diagnosis not present

## 2020-02-02 DIAGNOSIS — S73191A Other sprain of right hip, initial encounter: Secondary | ICD-10-CM | POA: Diagnosis not present

## 2020-02-02 DIAGNOSIS — M25852 Other specified joint disorders, left hip: Secondary | ICD-10-CM | POA: Diagnosis not present

## 2020-02-02 DIAGNOSIS — M94252 Chondromalacia, left hip: Secondary | ICD-10-CM | POA: Diagnosis not present

## 2020-02-02 DIAGNOSIS — S73192A Other sprain of left hip, initial encounter: Secondary | ICD-10-CM | POA: Diagnosis not present

## 2020-02-02 DIAGNOSIS — S73112A Iliofemoral ligament sprain of left hip, initial encounter: Secondary | ICD-10-CM | POA: Diagnosis not present

## 2020-02-02 DIAGNOSIS — M6588 Other synovitis and tenosynovitis, other site: Secondary | ICD-10-CM | POA: Diagnosis not present

## 2020-02-11 DIAGNOSIS — M6281 Muscle weakness (generalized): Secondary | ICD-10-CM | POA: Diagnosis not present

## 2020-02-11 DIAGNOSIS — M25652 Stiffness of left hip, not elsewhere classified: Secondary | ICD-10-CM | POA: Diagnosis not present

## 2020-02-11 DIAGNOSIS — R262 Difficulty in walking, not elsewhere classified: Secondary | ICD-10-CM | POA: Diagnosis not present

## 2020-02-11 DIAGNOSIS — M25552 Pain in left hip: Secondary | ICD-10-CM | POA: Diagnosis not present

## 2020-02-15 DIAGNOSIS — M6281 Muscle weakness (generalized): Secondary | ICD-10-CM | POA: Diagnosis not present

## 2020-02-15 DIAGNOSIS — M25552 Pain in left hip: Secondary | ICD-10-CM | POA: Diagnosis not present

## 2020-02-15 DIAGNOSIS — R262 Difficulty in walking, not elsewhere classified: Secondary | ICD-10-CM | POA: Diagnosis not present

## 2020-02-15 DIAGNOSIS — M25652 Stiffness of left hip, not elsewhere classified: Secondary | ICD-10-CM | POA: Diagnosis not present

## 2020-02-17 DIAGNOSIS — M25552 Pain in left hip: Secondary | ICD-10-CM | POA: Diagnosis not present

## 2020-02-17 DIAGNOSIS — M25652 Stiffness of left hip, not elsewhere classified: Secondary | ICD-10-CM | POA: Diagnosis not present

## 2020-02-17 DIAGNOSIS — R262 Difficulty in walking, not elsewhere classified: Secondary | ICD-10-CM | POA: Diagnosis not present

## 2020-02-17 DIAGNOSIS — M6281 Muscle weakness (generalized): Secondary | ICD-10-CM | POA: Diagnosis not present

## 2020-02-21 DIAGNOSIS — M25552 Pain in left hip: Secondary | ICD-10-CM | POA: Diagnosis not present

## 2020-02-21 DIAGNOSIS — M25652 Stiffness of left hip, not elsewhere classified: Secondary | ICD-10-CM | POA: Diagnosis not present

## 2020-02-21 DIAGNOSIS — R262 Difficulty in walking, not elsewhere classified: Secondary | ICD-10-CM | POA: Diagnosis not present

## 2020-02-21 DIAGNOSIS — M6281 Muscle weakness (generalized): Secondary | ICD-10-CM | POA: Diagnosis not present

## 2020-02-23 DIAGNOSIS — R262 Difficulty in walking, not elsewhere classified: Secondary | ICD-10-CM | POA: Diagnosis not present

## 2020-02-23 DIAGNOSIS — M25652 Stiffness of left hip, not elsewhere classified: Secondary | ICD-10-CM | POA: Diagnosis not present

## 2020-02-23 DIAGNOSIS — M25552 Pain in left hip: Secondary | ICD-10-CM | POA: Diagnosis not present

## 2020-02-23 DIAGNOSIS — M6281 Muscle weakness (generalized): Secondary | ICD-10-CM | POA: Diagnosis not present

## 2020-02-25 DIAGNOSIS — M25552 Pain in left hip: Secondary | ICD-10-CM | POA: Diagnosis not present

## 2020-02-25 DIAGNOSIS — R262 Difficulty in walking, not elsewhere classified: Secondary | ICD-10-CM | POA: Diagnosis not present

## 2020-02-25 DIAGNOSIS — M25652 Stiffness of left hip, not elsewhere classified: Secondary | ICD-10-CM | POA: Diagnosis not present

## 2020-02-25 DIAGNOSIS — M6281 Muscle weakness (generalized): Secondary | ICD-10-CM | POA: Diagnosis not present

## 2020-02-28 ENCOUNTER — Other Ambulatory Visit: Payer: Self-pay | Admitting: Family Medicine

## 2020-02-28 DIAGNOSIS — M25552 Pain in left hip: Secondary | ICD-10-CM | POA: Diagnosis not present

## 2020-02-28 DIAGNOSIS — M25652 Stiffness of left hip, not elsewhere classified: Secondary | ICD-10-CM | POA: Diagnosis not present

## 2020-02-28 DIAGNOSIS — M6281 Muscle weakness (generalized): Secondary | ICD-10-CM | POA: Diagnosis not present

## 2020-02-28 DIAGNOSIS — R262 Difficulty in walking, not elsewhere classified: Secondary | ICD-10-CM | POA: Diagnosis not present

## 2020-02-29 DIAGNOSIS — M25552 Pain in left hip: Secondary | ICD-10-CM | POA: Diagnosis not present

## 2020-02-29 DIAGNOSIS — Z4789 Encounter for other orthopedic aftercare: Secondary | ICD-10-CM | POA: Diagnosis not present

## 2020-03-06 DIAGNOSIS — M25652 Stiffness of left hip, not elsewhere classified: Secondary | ICD-10-CM | POA: Diagnosis not present

## 2020-03-06 DIAGNOSIS — M6281 Muscle weakness (generalized): Secondary | ICD-10-CM | POA: Diagnosis not present

## 2020-03-06 DIAGNOSIS — R262 Difficulty in walking, not elsewhere classified: Secondary | ICD-10-CM | POA: Diagnosis not present

## 2020-03-06 DIAGNOSIS — M25552 Pain in left hip: Secondary | ICD-10-CM | POA: Diagnosis not present

## 2020-03-13 DIAGNOSIS — M25552 Pain in left hip: Secondary | ICD-10-CM | POA: Diagnosis not present

## 2020-03-13 DIAGNOSIS — R262 Difficulty in walking, not elsewhere classified: Secondary | ICD-10-CM | POA: Diagnosis not present

## 2020-03-13 DIAGNOSIS — M25652 Stiffness of left hip, not elsewhere classified: Secondary | ICD-10-CM | POA: Diagnosis not present

## 2020-03-13 DIAGNOSIS — M6281 Muscle weakness (generalized): Secondary | ICD-10-CM | POA: Diagnosis not present

## 2020-03-20 DIAGNOSIS — R262 Difficulty in walking, not elsewhere classified: Secondary | ICD-10-CM | POA: Diagnosis not present

## 2020-03-20 DIAGNOSIS — M6281 Muscle weakness (generalized): Secondary | ICD-10-CM | POA: Diagnosis not present

## 2020-03-20 DIAGNOSIS — M25652 Stiffness of left hip, not elsewhere classified: Secondary | ICD-10-CM | POA: Diagnosis not present

## 2020-03-20 DIAGNOSIS — M25552 Pain in left hip: Secondary | ICD-10-CM | POA: Diagnosis not present

## 2020-03-27 DIAGNOSIS — M25552 Pain in left hip: Secondary | ICD-10-CM | POA: Diagnosis not present

## 2020-03-27 DIAGNOSIS — R262 Difficulty in walking, not elsewhere classified: Secondary | ICD-10-CM | POA: Diagnosis not present

## 2020-03-27 DIAGNOSIS — M6281 Muscle weakness (generalized): Secondary | ICD-10-CM | POA: Diagnosis not present

## 2020-03-27 DIAGNOSIS — M25652 Stiffness of left hip, not elsewhere classified: Secondary | ICD-10-CM | POA: Diagnosis not present

## 2020-04-03 DIAGNOSIS — R262 Difficulty in walking, not elsewhere classified: Secondary | ICD-10-CM | POA: Diagnosis not present

## 2020-04-03 DIAGNOSIS — M6281 Muscle weakness (generalized): Secondary | ICD-10-CM | POA: Diagnosis not present

## 2020-04-03 DIAGNOSIS — M25652 Stiffness of left hip, not elsewhere classified: Secondary | ICD-10-CM | POA: Diagnosis not present

## 2020-04-03 DIAGNOSIS — M25552 Pain in left hip: Secondary | ICD-10-CM | POA: Diagnosis not present

## 2020-04-11 DIAGNOSIS — R262 Difficulty in walking, not elsewhere classified: Secondary | ICD-10-CM | POA: Diagnosis not present

## 2020-04-11 DIAGNOSIS — M25652 Stiffness of left hip, not elsewhere classified: Secondary | ICD-10-CM | POA: Diagnosis not present

## 2020-04-11 DIAGNOSIS — M25552 Pain in left hip: Secondary | ICD-10-CM | POA: Diagnosis not present

## 2020-04-11 DIAGNOSIS — M6281 Muscle weakness (generalized): Secondary | ICD-10-CM | POA: Diagnosis not present

## 2020-04-19 DIAGNOSIS — M25552 Pain in left hip: Secondary | ICD-10-CM | POA: Diagnosis not present

## 2020-04-19 DIAGNOSIS — M25652 Stiffness of left hip, not elsewhere classified: Secondary | ICD-10-CM | POA: Diagnosis not present

## 2020-04-19 DIAGNOSIS — M6281 Muscle weakness (generalized): Secondary | ICD-10-CM | POA: Diagnosis not present

## 2020-04-19 DIAGNOSIS — R262 Difficulty in walking, not elsewhere classified: Secondary | ICD-10-CM | POA: Diagnosis not present

## 2020-05-02 DIAGNOSIS — M25552 Pain in left hip: Secondary | ICD-10-CM | POA: Diagnosis not present

## 2020-05-02 DIAGNOSIS — M25652 Stiffness of left hip, not elsewhere classified: Secondary | ICD-10-CM | POA: Diagnosis not present

## 2020-05-02 DIAGNOSIS — R262 Difficulty in walking, not elsewhere classified: Secondary | ICD-10-CM | POA: Diagnosis not present

## 2020-05-02 DIAGNOSIS — M6281 Muscle weakness (generalized): Secondary | ICD-10-CM | POA: Diagnosis not present

## 2020-05-09 DIAGNOSIS — M6281 Muscle weakness (generalized): Secondary | ICD-10-CM | POA: Diagnosis not present

## 2020-05-09 DIAGNOSIS — M25552 Pain in left hip: Secondary | ICD-10-CM | POA: Diagnosis not present

## 2020-05-09 DIAGNOSIS — R262 Difficulty in walking, not elsewhere classified: Secondary | ICD-10-CM | POA: Diagnosis not present

## 2020-05-09 DIAGNOSIS — M25652 Stiffness of left hip, not elsewhere classified: Secondary | ICD-10-CM | POA: Diagnosis not present

## 2020-05-30 DIAGNOSIS — M25552 Pain in left hip: Secondary | ICD-10-CM | POA: Diagnosis not present

## 2020-05-30 DIAGNOSIS — Z9889 Other specified postprocedural states: Secondary | ICD-10-CM | POA: Diagnosis not present

## 2020-08-02 ENCOUNTER — Other Ambulatory Visit: Payer: Self-pay | Admitting: Family Medicine

## 2020-08-08 ENCOUNTER — Other Ambulatory Visit: Payer: Self-pay | Admitting: Family Medicine

## 2020-08-24 ENCOUNTER — Ambulatory Visit (INDEPENDENT_AMBULATORY_CARE_PROVIDER_SITE_OTHER): Payer: Medicare Other | Admitting: Family Medicine

## 2020-08-24 ENCOUNTER — Other Ambulatory Visit: Payer: Self-pay

## 2020-08-24 ENCOUNTER — Encounter: Payer: Self-pay | Admitting: Family Medicine

## 2020-08-24 VITALS — BP 138/66 | HR 86 | Temp 97.8°F | Resp 16 | Ht 62.0 in | Wt 116.0 lb

## 2020-08-24 DIAGNOSIS — M7542 Impingement syndrome of left shoulder: Secondary | ICD-10-CM

## 2020-08-24 DIAGNOSIS — S76012A Strain of muscle, fascia and tendon of left hip, initial encounter: Secondary | ICD-10-CM | POA: Diagnosis not present

## 2020-08-24 NOTE — Progress Notes (Signed)
Subjective:    Patient ID: Theresa Washington, female    DOB: August 03, 1981, 39 y.o.   MRN: [id number]  Patient is a 39 year old Caucasian female who reports a 4 to 5-week history of pain in her left shoulder and her left hip.  Pain in her left shoulder occurs with abduction greater than 90 degrees.  It occurs with external rotation.  It occurs with range of motion.  It hurts when she tries to lift heavy objects such as picking up her laundry detergent.  She has pain with abduction greater than 90 degrees, pain with empty can testing, pain with Hawkins maneuver.  However her strength is normal.  She also has pain in the anterior left hip just lateral to the perineum next to the abductor longus muscle tendon.  It hurts to palpation in that area.  It hurts with resisted hip flexion and hip adduction.  There is no tenderness or pain over the greater trochanter.  There is no pain with flexion abduction and external rotation.  She has a negative straight leg raise.  She has no numbness or tingling radiating down the legs and she has excellent muscle strength 5/5 equal and symmetric in the hip flexors, hip extensors, knee flexors, knee extensors, and ankle dorsal and plantar flexors. Past Medical History:  Diagnosis Date  . Anemia   . Anxiety   . Back pain   . Bell's palsy   . Contraceptive management 11/30/2015  . DDD (degenerative disc disease)   . DDD (degenerative disc disease)   . Degenerative disc disease   . Fibromyalgia   . Fibromyalgia   . History of kidney stones   . Hypertension   . 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[place])   . Myositis   . Palpitations   . PVC (premature ventricular contraction)   . Scoliosis   . Seizures ([place])    unknown etiolgy; no meds and no seizures for 2 years.  . Smoker 11/30/2015  . Spondylosis    Past Surgical History:  Procedure Laterality Date  . DILATION AND CURETTAGE OF UTERUS  1998  . DILITATION & CURRETTAGE/HYSTROSCOPY WITH NOVASURE ABLATION N/A 01/16/2016   Procedure:  HYSTEROSCOPY WITH NOVASURE ABLATION;  Surgeon: Jonnie Kind, MD;  Location: [facility];  Service: Gynecology;  Laterality: N/A;  Uterine Cavity Length 4.0 cm Uterine Cavity Width 3.3cm Power 73  Time 1 minute 17seconds  . LAPAROSCOPIC BILATERAL SALPINGECTOMY Bilateral 01/16/2016   Procedure: LAPAROSCOPIC BILATERAL SALPINGECTOMY;  Surgeon: Jonnie Kind, MD;  Location: [facility];  Service: Gynecology;  Laterality: Bilateral;  procedure 1  . OOPHORECTOMY  2006   Left side   Current Outpatient Medications on File Prior to Visit  Medication Sig Dispense Refill  . celecoxib (CELEBREX) 100 MG capsule TAKE 1 CAPSULE(100 MG) BY MOUTH TWICE DAILY 60 capsule 3  . diclofenac sodium (VOLTAREN) 1 % GEL Apply topically as needed.    [name] HYDROcodone-acetaminophen (NORCO) 5-325 MG tablet Take 1-2 tablets by mouth every 6 (six) hours as needed for moderate pain. 15 tablet 0  . methylcellulose (ARTIFICIAL TEARS) 1 % ophthalmic solution Place 1 drop into both eyes daily as needed. Dry Eyes    . topiramate (TOPAMAX) 25 MG tablet TAKE 2 TABLETS(50 MG) BY MOUTH TWICE DAILY 360 tablet 1  . traMADol (ULTRAM) 50 MG tablet TAKE 1 TABLET(50 MG) BY MOUTH EVERY 8 HOURS AS NEEDED 60 tablet 0   No current facility-administered medications on file prior to visit.   Allergies  Allergen Reactions  .  Shrimp [Shellfish Allergy] Anaphylaxis, Hives and Swelling  . Other     Pt states "I carry an Epipen. I had anaphylactic reaction to something but I don't know what".    Social History   Socioeconomic History  . Marital status: Married    Spouse name: Not on file  . Number of children: Not on file  . Years of education: Not on file  . Highest education level: Not on file  Occupational History  . Not on file  Tobacco Use  . Smoking status: Former Smoker    Packs/day: 1.00    Years: 20.00    Pack years: 20.00    Types: Cigarettes    Quit date: 09/12/2016    Years since quitting: 3.9  . Smokeless tobacco: Never Used   Vaping Use  . Vaping Use: Every day  Substance and Sexual Activity  . Alcohol use: No  . Drug use: No  . Sexual activity: Yes    Birth control/protection: Surgical    Comment: tubal and ablation  Other Topics Concern  . Not on file  Social History Narrative  . Not on file   Social Determinants of Health   Financial Resource Strain:   . Difficulty of Paying Living Expenses: Not on file  Food Insecurity:   . Worried About Charity fundraiser in the Last Year: Not on file  . Ran Out of Food in the Last Year: Not on file  Transportation Needs:   . Lack of Transportation (Medical): Not on file  . Lack of Transportation (Non-Medical): Not on file  Physical Activity:   . Days of Exercise per Week: Not on file  . Minutes of Exercise per Session: Not on file  Stress:   . Feeling of Stress : Not on file  Social Connections:   . Frequency of Communication with Friends and Family: Not on file  . Frequency of Social Gatherings with Friends and Family: Not on file  . Attends Religious Services: Not on file  . Active Member of Clubs or Organizations: Not on file  . Attends Archivist Meetings: Not on file  . Marital Status: Not on file  Intimate Partner Violence:   . Fear of Current or Ex-Partner: Not on file  . Emotionally Abused: Not on file  . Physically Abused: Not on file  . Sexually Abused: Not on file       Review of Systems  Musculoskeletal: Positive for back pain.  All other systems reviewed and are negative.      Objective:   Physical Exam Vitals reviewed.  Cardiovascular:     Rate and Rhythm: Normal rate and regular rhythm.     Heart sounds: Normal heart sounds.  Pulmonary:     Effort: Pulmonary effort is normal. No respiratory distress.     Breath sounds: Normal breath sounds. No wheezing or rales.  Abdominal:     General: Bowel sounds are normal.     Palpations: Abdomen is soft.  Musculoskeletal:     Lumbar back: No tenderness or bony  tenderness. Normal range of motion.     Right hip: No tenderness or bony tenderness. Normal range of motion. Normal strength.     Left hip: No tenderness or bony tenderness. Normal range of motion. Normal strength.     Left upper leg: Tenderness present. No swelling, edema, deformity or bony tenderness.       Legs:        Assessment & Plan:  Impingement syndrome  of shoulder, left  Hip strain, left, initial encounter  Recommended rest, activity modification, Celebrex, and tincture of time.  If pain is not improving over the next 2 to 4 weeks would recommend a trial of physical therapy.  Patient is comfortable with this plan

## 2020-10-24 ENCOUNTER — Other Ambulatory Visit: Payer: Self-pay

## 2020-10-24 ENCOUNTER — Ambulatory Visit (INDEPENDENT_AMBULATORY_CARE_PROVIDER_SITE_OTHER): Payer: Medicare Other | Admitting: Family Medicine

## 2020-10-24 VITALS — BP 138/80 | HR 86 | Temp 97.5°F | Ht 62.0 in | Wt 115.0 lb

## 2020-10-24 DIAGNOSIS — R55 Syncope and collapse: Secondary | ICD-10-CM | POA: Diagnosis not present

## 2020-10-24 DIAGNOSIS — R946 Abnormal results of thyroid function studies: Secondary | ICD-10-CM | POA: Diagnosis not present

## 2020-10-24 NOTE — Progress Notes (Signed)
Subjective:    Patient ID: Theresa Washington, female    DOB: 1981/05/06, 39 y.o.   MRN: [id number]  Wt Readings from Last 3 Encounters:  10/24/20 115 lb (52.2 kg)  08/24/20 116 lb (52.6 kg)  12/17/19 118 lb (53.5 kg)   Patient states that since September, she is having frequent episodes of syncope or near syncope. The first episode that she remembers was in September. She states that she is embarrassed to report this, however after she had had vigorous intercourse with her husband, she went to the bathroom in order to clean up. She states that she was simply standing in the bathroom and she lost consciousness and fell to the floor. She states that her husband said she passed out 4 times. Every time she would try to get back up she would lose consciousness again. She attributed this to their activity prior. However since that time she states that she has not felt quite right. She states that on a regular basis now she is having episodes where she feels like she may pass out. One episode occurred when she went into the kitchen. She stood up to open female. She was walking to the kitchen and became extremely lightheaded and felt like she was going to pass out. She had a fall to her knees to prevent from blacking out. Today she was driving in a car to her mother's house. She had to pull over on the road to prevent from blacking out. She denies any seizure-like activity. She denies any loss of bowel or bladder incontinence. The 3 episodes that I mentioned above involve standing up after activity or position changes. 1 was standing up to go to the bathroom after intercourse. 1 was standing up to go into the kitchen to open female. 1 was getting out of a car to go into her mother's house. In all instances she started to feel like she was going to pass out. This suggests to me a drop in her blood pressure. She denies any bleeding or bruising or melena or hematochezia or vaginal bleeding  Past Medical History:   Diagnosis Date  . Anemia   . Anxiety   . Back pain   . Bell's palsy   . Contraceptive management 11/30/2015  . DDD (degenerative disc disease)   . DDD (degenerative disc disease)   . Degenerative disc disease   . Fibromyalgia   . Fibromyalgia   . History of kidney stones   . Hypertension   . 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[place])   . Myositis   . Palpitations   . PVC (premature ventricular contraction)   . Scoliosis   . Seizures ([place])    unknown etiolgy; no meds and no seizures for 2 years.  . Smoker 11/30/2015  . Spondylosis    Past Surgical History:  Procedure Laterality Date  . DILATION AND CURETTAGE OF UTERUS  1998  . DILITATION & CURRETTAGE/HYSTROSCOPY WITH NOVASURE ABLATION N/A 01/16/2016   Procedure: HYSTEROSCOPY WITH NOVASURE ABLATION;  Surgeon: Jonnie Kind, MD;  Location: [facility];  Service: Gynecology;  Laterality: N/A;  Uterine Cavity Length 4.0 cm Uterine Cavity Width 3.3cm Power 73  Time 1 minute 17seconds  . LAPAROSCOPIC BILATERAL SALPINGECTOMY Bilateral 01/16/2016   Procedure: LAPAROSCOPIC BILATERAL SALPINGECTOMY;  Surgeon: Jonnie Kind, MD;  Location: [facility];  Service: Gynecology;  Laterality: Bilateral;  procedure 1  . OOPHORECTOMY  2006   Left side   Current Outpatient Medications on File Prior to Visit  Medication Sig  Dispense Refill  . celecoxib (CELEBREX) 100 MG capsule TAKE 1 CAPSULE(100 MG) BY MOUTH TWICE DAILY 60 capsule 3  . diclofenac sodium (VOLTAREN) 1 % GEL Apply topically as needed.    . methylcellulose (ARTIFICIAL TEARS) 1 % ophthalmic solution Place 1 drop into both eyes daily as needed. Dry Eyes    . topiramate (TOPAMAX) 25 MG tablet TAKE 2 TABLETS(50 MG) BY MOUTH TWICE DAILY 360 tablet 1  . traMADol (ULTRAM) 50 MG tablet TAKE 1 TABLET(50 MG) BY MOUTH EVERY 8 HOURS AS NEEDED 60 tablet 0  . HYDROcodone-acetaminophen (NORCO) 5-325 MG tablet Take 1-2 tablets by mouth every 6 (six) hours as needed for moderate pain. (Patient not taking: Reported on 10/24/2020)  15 tablet 0   No current facility-administered medications on file prior to visit.   Allergies  Allergen Reactions  . Shrimp [Shellfish Allergy] Anaphylaxis, Hives and Swelling  . Other     Pt states "I carry an Epipen. I had anaphylactic reaction to something but I don't know what".    Social History   Socioeconomic History  . Marital status: Married    Spouse name: Not on file  . Number of children: Not on file  . Years of education: Not on file  . Highest education level: Not on file  Occupational History  . Not on file  Tobacco Use  . Smoking status: Former Smoker    Packs/day: 1.00    Years: 20.00    Pack years: 20.00    Types: Cigarettes    Quit date: 09/12/2016    Years since quitting: 4.1  . Smokeless tobacco: Never Used  Vaping Use  . Vaping Use: Every day  Substance and Sexual Activity  . Alcohol use: No  . Drug use: No  . Sexual activity: Yes    Birth control/protection: Surgical    Comment: tubal and ablation  Other Topics Concern  . Not on file  Social History Narrative  . Not on file   Social Determinants of Health   Financial Resource Strain:   . Difficulty of Paying Living Expenses: Not on file  Food Insecurity:   . Worried About Charity fundraiser in the Last Year: Not on file  . Ran Out of Food in the Last Year: Not on file  Transportation Needs:   . Lack of Transportation (Medical): Not on file  . Lack of Transportation (Non-Medical): Not on file  Physical Activity:   . Days of Exercise per Week: Not on file  . Minutes of Exercise per Session: Not on file  Stress:   . Feeling of Stress : Not on file  Social Connections:   . Frequency of Communication with Friends and Family: Not on file  . Frequency of Social Gatherings with Friends and Family: Not on file  . Attends Religious Services: Not on file  . Active Member of Clubs or Organizations: Not on file  . Attends Archivist Meetings: Not on file  . Marital Status: Not on  file  Intimate Partner Violence:   . Fear of Current or Ex-Partner: Not on file  . Emotionally Abused: Not on file  . Physically Abused: Not on file  . Sexually Abused: Not on file       Review of Systems  Musculoskeletal: Positive for back pain.  All other systems reviewed and are negative.      Objective:   Physical Exam Vitals reviewed.  Constitutional:      General: She is not in  acute distress.    Appearance: She is well-developed. She is not diaphoretic.  Cardiovascular:     Rate and Rhythm: Normal rate and regular rhythm.     Heart sounds: Normal heart sounds.  Pulmonary:     Effort: Pulmonary effort is normal. No respiratory distress.     Breath sounds: Normal breath sounds. No wheezing or rales.  Abdominal:     General: Bowel sounds are normal. There is no distension.     Palpations: Abdomen is soft.     Tenderness: There is no abdominal tenderness. There is no guarding or rebound.  Skin:    General: Skin is warm and dry.         Assessment & Plan:  Near syncope - Plan: CBC with Differential/Platelet, COMPLETE METABOLIC PANEL WITH GFR, TSH, EKG 12-Lead  I suspect that the patient is having vasovagal episodes. I cannot rule out POTS syndrome. I believe the patient would benefit from cardiology consultation for an echocardiogram as well as a cardiac monitor to evaluate for cardiac arrhythmias. Meanwhile I will check a CBC to evaluate for anemia and a CMP and a TSH to evaluate for any electrolyte or hormone abnormalities that can cause cardiac arrhythmias. EKG today shows normal sinus rhythm with normal intervals and a normal axis with no evidence of Wolff-Parkinson-White or long QT syndrome or Brugada syndrome. Spent more than 30 minutes today with the patient explaining the differential diagnosis and the work-up. However I suspect that she is having vasovagal episodes.

## 2020-10-25 LAB — COMPLETE METABOLIC PANEL WITH GFR
AG Ratio: 1.9 (calc) (ref 1.0–2.5)
ALT: 6 U/L (ref 6–29)
AST: 12 U/L (ref 10–30)
Albumin: 4.3 g/dL (ref 3.6–5.1)
Alkaline phosphatase (APISO): 43 U/L (ref 31–125)
BUN: 20 mg/dL (ref 7–25)
CO2: 21 mmol/L (ref 20–32)
Calcium: 9.1 mg/dL (ref 8.6–10.2)
Chloride: 106 mmol/L (ref 98–110)
Creat: 0.81 mg/dL (ref 0.50–1.10)
GFR, Est African American: 106 mL/min/{1.73_m2} (ref >=60)
GFR, Est Non African American: 91 mL/min/{1.73_m2} (ref >=60)
Globulin: 2.3 g/dL (calc) (ref 1.9–3.7)
Glucose, Bld: 77 mg/dL (ref 65–99)
Potassium: 4.1 mmol/L (ref 3.5–5.3)
Sodium: 137 mmol/L (ref 135–146)
Total Bilirubin: 0.3 mg/dL (ref 0.2–1.2)
Total Protein: 6.6 g/dL (ref 6.1–8.1)

## 2020-10-25 LAB — CBC WITH DIFFERENTIAL/PLATELET
Absolute Monocytes: 545 cells/uL (ref 200–950)
Basophils Absolute: 32 cells/uL (ref 0–200)
Basophils Relative: 0.4 %
Eosinophils Absolute: 79 cells/uL (ref 15–500)
Eosinophils Relative: 1 %
HCT: 37.8 % (ref 35.0–45.0)
Hemoglobin: 12.7 g/dL (ref 11.7–15.5)
Lymphs Abs: 2212 cells/uL (ref 850–3900)
MCH: 30.4 pg (ref 27.0–33.0)
MCHC: 33.6 g/dL (ref 32.0–36.0)
MCV: 90.4 fL (ref 80.0–100.0)
MPV: 9.9 fL (ref 7.5–12.5)
Monocytes Relative: 6.9 %
Neutro Abs: 5032 cells/uL (ref 1500–7800)
Neutrophils Relative %: 63.7 %
Platelets: 292 10*3/uL (ref 140–400)
RBC: 4.18 10*6/uL (ref 3.80–5.10)
RDW: 12.3 % (ref 11.0–15.0)
Total Lymphocyte: 28 %
WBC: 7.9 10*3/uL (ref 3.8–10.8)

## 2020-10-25 LAB — TSH: TSH: 4.58 mIU/L — ABNORMAL HIGH

## 2020-11-05 ENCOUNTER — Other Ambulatory Visit: Payer: Self-pay | Admitting: Family Medicine

## 2020-11-24 ENCOUNTER — Ambulatory Visit: Payer: Medicare Other | Admitting: Internal Medicine

## 2020-11-24 ENCOUNTER — Encounter: Payer: Self-pay | Admitting: Internal Medicine

## 2020-11-24 ENCOUNTER — Encounter: Payer: Self-pay | Admitting: *Deleted

## 2020-11-24 ENCOUNTER — Other Ambulatory Visit: Payer: Self-pay

## 2020-11-24 VITALS — BP 130/62 | HR 79 | Ht 60.0 in | Wt 119.0 lb

## 2020-11-24 DIAGNOSIS — I340 Nonrheumatic mitral (valve) insufficiency: Secondary | ICD-10-CM

## 2020-11-24 DIAGNOSIS — I10 Essential (primary) hypertension: Secondary | ICD-10-CM | POA: Diagnosis not present

## 2020-11-24 DIAGNOSIS — R55 Syncope and collapse: Secondary | ICD-10-CM | POA: Diagnosis not present

## 2020-11-24 NOTE — Patient Instructions (Signed)
Medication Instructions:  Your physician recommends that you continue on your current medications as directed. Please refer to the Current Medication list given to you today.  *If you need a refill on your cardiac medications before your next appointment, please call your pharmacy*   Lab Work: None If you have labs (blood work) drawn today and your tests are completely normal, you will receive your results only by: [name] MyChart Message (if you have MyChart) OR . A paper copy in the mail If you have any lab test that is abnormal or we need to change your treatment, we will call you to review the results.   Testing/Procedures: Your physician has requested that you have an echocardiogram. Echocardiography is a painless test that uses sound waves to create images of your heart. It provides your doctor with information about the size and shape of your heart and how well your heart's chambers and valves are working. This procedure takes approximately one hour. There are no restrictions for this procedure.  Your physician has requested that you have an exercise tolerance test. For further information please visit [email or web address]. Please also follow instruction sheet, as given.     Follow-Up: At [facility], you and your health needs are our priority.  As part of our continuing mission to provide you with exceptional heart care, we have created designated Provider Care Teams.  These Care Teams include your primary Cardiologist (physician) and Advanced Practice Providers (APPs -  Physician Assistants and Nurse Practitioners) who all work together to provide you with the care you need, when you need it.  We recommend signing up for the patient portal called "MyChart".  Sign up information is provided on this After Visit Summary.  MyChart is used to connect with patients for Virtual Visits (Telemedicine).  Patients are able to view lab/test results, encounter notes, upcoming appointments, etc.   Non-urgent messages can be sent to your provider as well.   To learn more about what you can do with MyChart, go to [email or web address].    Your next appointment:   3 month(s)  The format for your next appointment:   In Person  Provider:   You may see Werner Lean, MD or one of the following Advanced Practice Providers on your designated Care Team:    Melina Copa, PA-C  Ermalinda Barrios, PA-C    Other Instruction  Due to recent COVID-19 restrictions implemented by our local and state authorities and in an effort to keep both patients and staff as safe as possible, our hospital system requires COVID-19 testing prior to certain scheduled hospital procedures.  Please go to [address] [place], [place] [address]  .  This is a drive up testing site.  You will not need to exit your vehicle.  You will not be billed at the time of testing but may receive a bill later depending on your insurance. You must agree to self-quarantine from the time of your testing until the procedure date  This should included staying home with ONLY the people you live with.  Avoid take-out, grocery store shopping or leaving the house for any non-emergent reason.  Failure to have your COVID-19 test done on the date and time you have been scheduled will result in cancellation of your procedure.  Please call our office at [phone] if you have any questions.

## 2020-11-24 NOTE — Progress Notes (Signed)
Cardiology Office Note:    Date:  11/24/2020   ID:  Theresa Washington, DOB 01-25-1981, MRN [id number]  PCP:  Susy Frizzle, MD  [facility] Cardiologist:  Werner Lean, MD  Former Dr. Harrington Challenger, Dr. Rayann Heman, [facility] Electrophysiologist:  None   CC: Near syncope Consulted for the evaluation of syncope at the [place] of Susy Frizzle, MD  History of Present Illness:    Theresa Washington is a 40 y.o. female with a hx of von Willebrand's Disease, HTN, recurrent palpitations former tobacco user, previously seen by EP who presents for evaluation.  Patient notes that she is feeling that she has had a couple of times that she has had tunnel vision and has had near syncope.  Has had two event:  The first was after intercourse and after she stood up after using the toilet and blacked out; husband and blacked out.  Has had these event again after getting up to leave the table.  Has had no chest pain, chest pressure, chest tightness, chest stinging.  Discomfort occurs with changing position (getting up out of the car after prolonged driving).  Her palms get sweaty and she develops palm sweating with this.  No shortness of breath, DOE .  No PND or orthopnea.  No bendopnea, weight gain, leg swelling , or abdominal swelling.  One year ago was walking from the gas station; just got done walking and stopped.  Notes palpitations have not changes since prior evaluations.     No history of pre-eclampsia or prematurity.    Ambulatory BP 130/70s..   Past Medical History:  Diagnosis Date  . Anemia   . Anxiety   . Back pain   . Bell's palsy   . Contraceptive management 11/30/2015  . DDD (degenerative disc disease)   . DDD (degenerative disc disease)   . Degenerative disc disease   . Fibromyalgia   . Fibromyalgia   . History of kidney stones   . Hypertension   . 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[place])   . Myositis   . Palpitations   . PVC (premature ventricular contraction)    . Scoliosis   . Seizures ([place])    unknown etiolgy; no meds and no seizures for 2 years.  . Smoker 11/30/2015  . Spondylosis     Past Surgical History:  Procedure Laterality Date  . DILATION AND CURETTAGE OF UTERUS  1998  . DILITATION & CURRETTAGE/HYSTROSCOPY WITH NOVASURE ABLATION N/A 01/16/2016   Procedure: HYSTEROSCOPY WITH NOVASURE ABLATION;  Surgeon: Jonnie Kind, MD;  Location: [facility];  Service: Gynecology;  Laterality: N/A;  Uterine Cavity Length 4.0 cm Uterine Cavity Width 3.3cm Power 73  Time 1 minute 17seconds  . LAPAROSCOPIC BILATERAL SALPINGECTOMY Bilateral 01/16/2016   Procedure: LAPAROSCOPIC BILATERAL SALPINGECTOMY;  Surgeon: Jonnie Kind, MD;  Location: [facility];  Service: Gynecology;  Laterality: Bilateral;  procedure 1  . OOPHORECTOMY  2006   Left side    Current Medications: Current Meds  Medication Sig  . acetaminophen (TYLENOL) 500 MG tablet Take 500 mg by mouth every 6 (six) hours as needed.  . celecoxib (CELEBREX) 100 MG capsule TAKE 1 CAPSULE(100 MG) BY MOUTH TWICE DAILY  . diclofenac sodium (VOLTAREN) 1 % GEL Apply topically as needed.  [name] EPINEPHrine (EPIPEN JR) 0.15 MG/0.3ML injection Inject 0.15 mg into the muscle as needed for anaphylaxis.  [name] methylcellulose (ARTIFICIAL TEARS) 1 % ophthalmic solution Place 1 drop into both eyes daily as needed. Dry Eyes  .  topiramate (TOPAMAX) 25 MG tablet TAKE 2 TABLETS(50 MG) BY MOUTH TWICE DAILY  . traMADol (ULTRAM) 50 MG tablet TAKE 1 TABLET(50 MG) BY MOUTH EVERY 8 HOURS AS NEEDED     Allergies:   Shrimp [shellfish allergy] and Other   Social History   Socioeconomic History  . Marital status: Married    Spouse name: Not on file  . Number of children: Not on file  . Years of education: Not on file  . Highest education level: Not on file  Occupational History  . Not on file  Tobacco Use  . Smoking status: Former Smoker    Packs/day: 1.00    Years: 20.00    Pack years: 20.00    Types: Cigarettes    Quit  date: 09/12/2016    Years since quitting: 4.2  . Smokeless tobacco: Never Used  Vaping Use  . Vaping Use: Every day  Substance and Sexual Activity  . Alcohol use: No  . Drug use: No  . Sexual activity: Yes    Birth control/protection: Surgical    Comment: tubal and ablation  Other Topics Concern  . Not on file  Social History Narrative  . Not on file   Social Determinants of Health   Financial Resource Strain: Not on file  Food Insecurity: Not on file  Transportation Needs: Not on file  Physical Activity: Not on file  Stress: Not on file  Social Connections: Not on file     Family History: The patient's family history includes Alcohol abuse in her brother; Colon cancer in her maternal grandmother; Heart disease in her father; Kidney disease in her father; Mental retardation in her brother; Multiple sclerosis in her mother; Other in her brother; Schizophrenia in her brother. History of coronary artery disease notable for father before age 24. History of heart failure notable for grandfather, sone has aortic valve insufficiency (mild). No history of cardiomyopathies including hypertrophic cardiomyopathy, left ventricular non-compaction, or arrhythmogenic right ventricular cardiomyopathy. History of arrhythmia notable for no members. Denies family history of sudden cardiac death including drowning, car accidents, or unexplained deaths in the family. No history of bicuspid aortic valve or aortic aneurysm or dissection.   ROS:   Please see the history of present illness.     All other systems reviewed and are negative.  EKGs/Labs/Other Studies Reviewed:    The following studies were reviewed today:  EKG:  10/24/20: SR 70 QTc 405  Cardiac Event Monitoring: Date:04/27/2014 Results: No significant arrhythmias  Transthoracic Echocardiogram: Date:04/27/2014 Results: Study Conclusions  - Left ventricle: The cavity size was normal. Wall thickness was  normal. Systolic  function was normal. The estimated ejection  fraction was in the range of 60% to 65%. Wall motion was normal;  there were no regional wall motion abnormalities. Left  ventricular diastolic function parameters were normal.  - Mitral valve: There was mild regurgitation.    NonCardiac CT : Date:07/21/2012 Results: No coronary calcium or aortic atherosclerosis  Recent Labs: 10/24/2020: ALT 6; BUN 20; Creat 0.81; Hemoglobin 12.7; Platelets 292; Potassium 4.1; Sodium 137; TSH 4.58  Recent Lipid Panel No results found for: CHOL, TRIG, HDL, CHOLHDL, VLDL, LDLCALC, LDLDIRECT   Risk Assessment/Calculations:     N/A  Physical Exam:    VS:  BP 130/62   Pulse 79   Ht 5' (1.524 m)   Wt 119 lb (54 kg)   SpO2 100%   BMI 23.24 kg/m     Wt Readings from Last 3 Encounters:  11/24/20 119  lb (54 kg)  10/24/20 115 lb (52.2 kg)  08/24/20 116 lb (52.6 kg)   GEN:  Well nourished, well developed in no acute distress HEENT: Normal NECK: No JVD; No carotid bruits LYMPHATICS: No lymphadenopathy CARDIAC: RRR, II/VI II/VI systolic heart murmur, no rubs, gallops RESPIRATORY:  Clear to auscultation without rales, wheezing or rhonchi  ABDOMEN: Soft, non-tender, non-distended MUSCULOSKELETAL:  No edema; No deformity  SKIN: Warm and dry NEUROLOGIC:  Alert and oriented x 3 PSYCHIATRIC:  Normal affect   ASSESSMENT:    1. Vasovagal syncope   2. Essential hypertension   3. Nonrheumatic mitral valve regurgitation    PLAN:    Vasovagal Syncope HTN Mild Mitral regurgitation - discussed positional maneurvers - given one episodes of possible exercise related syncope, will get POET and Echo - will allow BP 140/85  3 months follow up unless new symptoms or abnormal test results warranting change in plan  Would be reasonable for Virtual Follow up  Would be reasonable for APP Follow up  Shared Decision Making/Informed Consent The risks [chest pain, shortness of breath, cardiac arrhythmias,  dizziness, blood pressure fluctuations, myocardial infarction, stroke/transient ischemic attack, and life-threatening complications (estimated to be 1 in 10,000)], benefits (risk stratification, diagnosing coronary artery disease, treatment guidance) and alternatives of an exercise tolerance test were discussed in detail with Ms. Viscomi and she agrees to proceed.      Medication Adjustments/Labs and Tests Ordered: Current medicines are reviewed at length with the patient today.  Concerns regarding medicines are outlined above.  Orders Placed This Encounter  Procedures  . EXERCISE TOLERANCE TEST (ETT)  . ECHOCARDIOGRAM COMPLETE   No orders of the defined types were placed in this encounter.   Patient Instructions  Medication Instructions:  Your physician recommends that you continue on your current medications as directed. Please refer to the Current Medication list given to you today.  *If you need a refill on your cardiac medications before your next appointment, please call your pharmacy*   Lab Work: None If you have labs (blood work) drawn today and your tests are completely normal, you will receive your results only by: [name] MyChart Message (if you have MyChart) OR . A paper copy in the mail If you have any lab test that is abnormal or we need to change your treatment, we will call you to review the results.   Testing/Procedures: Your physician has requested that you have an echocardiogram. Echocardiography is a painless test that uses sound waves to create images of your heart. It provides your doctor with information about the size and shape of your heart and how well your heart's chambers and valves are working. This procedure takes approximately one hour. There are no restrictions for this procedure.  Your physician has requested that you have an exercise tolerance test. For further information please visit [email or web address]. Please also follow instruction sheet, as  given.     Follow-Up: At [facility], you and your health needs are our priority.  As part of our continuing mission to provide you with exceptional heart care, we have created designated Provider Care Teams.  These Care Teams include your primary Cardiologist (physician) and Advanced Practice Providers (APPs -  Physician Assistants and Nurse Practitioners) who all work together to provide you with the care you need, when you need it.  We recommend signing up for the patient portal called "MyChart".  Sign up information is provided on this After Visit Summary.  MyChart is used to connect with patients  for Virtual Visits (Telemedicine).  Patients are able to view lab/test results, encounter notes, upcoming appointments, etc.  Non-urgent messages can be sent to your provider as well.   To learn more about what you can do with MyChart, go to [email or web address].    Your next appointment:   3 month(s)  The format for your next appointment:   In Person  Provider:   You may see Werner Lean, MD or one of the following Advanced Practice Providers on your designated Care Team:    Melina Copa, PA-C  Ermalinda Barrios, PA-C    Other Instruction  Due to recent COVID-19 restrictions implemented by our local and state authorities and in an effort to keep both patients and staff as safe as possible, our hospital system requires COVID-19 testing prior to certain scheduled hospital procedures.  Please go to [address]. [place], [place] [address]  .  This is a drive up testing site.  You will not need to exit your vehicle.  You will not be billed at the time of testing but may receive a bill later depending on your insurance. You must agree to self-quarantine from the time of your testing until the procedure date  This should included staying home with ONLY the people you live with.  Avoid take-out, grocery store shopping or leaving the house for any non-emergent reason.  Failure to have  your COVID-19 test done on the date and time you have been scheduled will result in cancellation of your procedure.  Please call our office at [phone] if you have any questions.       Signed, Werner Lean, MD  11/24/2020 12:22 PM    [facility]

## 2021-01-06 ENCOUNTER — Other Ambulatory Visit (HOSPITAL_COMMUNITY)
Admission: RE | Admit: 2021-01-06 | Discharge: 2021-01-06 | Disposition: A | Discharge status: Home / Self Care | Payer: Medicare Other | Source: Ambulatory Visit | Attending: Internal Medicine | Admitting: Internal Medicine

## 2021-01-06 DIAGNOSIS — Z01812 Encounter for preprocedural laboratory examination: Secondary | ICD-10-CM | POA: Insufficient documentation

## 2021-01-06 DIAGNOSIS — Z20822 Contact with and (suspected) exposure to covid-19: Secondary | ICD-10-CM | POA: Insufficient documentation

## 2021-01-06 LAB — SARS CORONAVIRUS 2 (TAT 6-24 HRS): SARS Coronavirus 2: NEGATIVE

## 2021-01-08 ENCOUNTER — Encounter: Payer: Self-pay | Admitting: Family Medicine

## 2021-01-08 ENCOUNTER — Ambulatory Visit (INDEPENDENT_AMBULATORY_CARE_PROVIDER_SITE_OTHER): Payer: Medicare Other | Admitting: Family Medicine

## 2021-01-08 ENCOUNTER — Other Ambulatory Visit: Payer: Self-pay

## 2021-01-08 VITALS — BP 117/72 | HR 70 | Temp 98.0°F | Ht 60.0 in | Wt 117.5 lb

## 2021-01-08 DIAGNOSIS — M7542 Impingement syndrome of left shoulder: Secondary | ICD-10-CM | POA: Diagnosis not present

## 2021-01-08 NOTE — Progress Notes (Signed)
Subjective:    Patient ID: Theresa Washington, female    DOB: 11-Nov-1981, 40 y.o.   MRN: [id number]  10/24/20 Patient states that since September, she is having frequent episodes of syncope or near syncope. The first episode that she remembers was in September. She states that she is embarrassed to report this, however after she had had vigorous intercourse with her husband, she went to the bathroom in order to clean up. She states that she was simply standing in the bathroom and she lost consciousness and fell to the floor. She states that her husband said she passed out 4 times. Every time she would try to get back up she would lose consciousness again. She attributed this to their activity prior. However since that time she states that she has not felt quite right. She states that on a regular basis now she is having episodes where she feels like she may pass out. One episode occurred when she went into the kitchen. She stood up to open female. She was walking to the kitchen and became extremely lightheaded and felt like she was going to pass out. She had a fall to her knees to prevent from blacking out. Today she was driving in a car to her mother's house. She had to pull over on the road to prevent from blacking out. She denies any seizure-like activity. She denies any loss of bowel or bladder incontinence. The 3 episodes that I mentioned above involve standing up after activity or position changes. 1 was standing up to go to the bathroom after intercourse. 1 was standing up to go into the kitchen to open female. 1 was getting out of a car to go into her mother's house. In all instances she started to feel like she was going to pass out. This suggests to me a drop in her blood pressure. She denies any bleeding or bruising or melena or hematochezia or vaginal bleeding.  At that time, my plan was: I suspect that the patient is having vasovagal episodes. I cannot rule out POTS syndrome. I believe the patient  would benefit from cardiology consultation for an echocardiogram as well as a cardiac monitor to evaluate for cardiac arrhythmias. Meanwhile I will check a CBC to evaluate for anemia and a CMP and a TSH to evaluate for any electrolyte or hormone abnormalities that can cause cardiac arrhythmias. EKG today shows normal sinus rhythm with normal intervals and a normal axis with no evidence of Wolff-Parkinson-White or long QT syndrome or Brugada syndrome. Spent more than 30 minutes today with the patient explaining the differential diagnosis and the work-up. However I suspect that she is having vasovagal episodes.  01/08/21 Patient presents today with pain in her left shoulder.  She states is been hurting for 4 weeks.  However over the last few days has become unbearable.  She states that it aches and throbs no matter what she does.  At night she has trouble sleeping due to the aching pain in her shoulder.  The pain is located below the clavicle in the subacromial space.  She also reports pain with abduction greater than approximately 60 degrees.  Even passive range of motion greater than 60 degrees elicits pain.  She has pain with empty can testing.  She has pain with Hawkins maneuver.  However she has normal strength and no evidence of any muscle weakness Past Medical History:  Diagnosis Date  . Anemia   . Anxiety   . Back pain   .  Bell's palsy   . Contraceptive management 11/30/2015  . DDD (degenerative disc disease)   . DDD (degenerative disc disease)   . Degenerative disc disease   . Fibromyalgia   . Fibromyalgia   . History of kidney stones   . Hypertension   . 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[place])   . Myositis   . Palpitations   . PVC (premature ventricular contraction)   . Scoliosis   . Seizures ([place])    unknown etiolgy; no meds and no seizures for 2 years.  . Smoker 11/30/2015  . Spondylosis    Past Surgical History:  Procedure Laterality Date  . DILATION AND CURETTAGE OF UTERUS  1998  . DILITATION &  CURRETTAGE/HYSTROSCOPY WITH NOVASURE ABLATION N/A 01/16/2016   Procedure: HYSTEROSCOPY WITH NOVASURE ABLATION;  Surgeon: Jonnie Kind, MD;  Location: [facility];  Service: Gynecology;  Laterality: N/A;  Uterine Cavity Length 4.0 cm Uterine Cavity Width 3.3cm Power 73  Time 1 minute 17seconds  . LAPAROSCOPIC BILATERAL SALPINGECTOMY Bilateral 01/16/2016   Procedure: LAPAROSCOPIC BILATERAL SALPINGECTOMY;  Surgeon: Jonnie Kind, MD;  Location: [facility];  Service: Gynecology;  Laterality: Bilateral;  procedure 1  . OOPHORECTOMY  2006   Left side   Current Outpatient Medications on File Prior to Visit  Medication Sig Dispense Refill  . acetaminophen (TYLENOL) 500 MG tablet Take 500 mg by mouth every 6 (six) hours as needed.    . celecoxib (CELEBREX) 100 MG capsule TAKE 1 CAPSULE(100 MG) BY MOUTH TWICE DAILY 60 capsule 3  . diclofenac sodium (VOLTAREN) 1 % GEL Apply topically as needed.    [name] EPINEPHrine (EPIPEN JR) 0.15 MG/0.3ML injection Inject 0.15 mg into the muscle as needed for anaphylaxis.    [name] methylcellulose (ARTIFICIAL TEARS) 1 % ophthalmic solution Place 1 drop into both eyes daily as needed. Dry Eyes    . topiramate (TOPAMAX) 25 MG tablet TAKE 2 TABLETS(50 MG) BY MOUTH TWICE DAILY 360 tablet 1  . traMADol (ULTRAM) 50 MG tablet TAKE 1 TABLET(50 MG) BY MOUTH EVERY 8 HOURS AS NEEDED 60 tablet 0   No current facility-administered medications on file prior to visit.   Allergies  Allergen Reactions  . Shrimp [Shellfish Allergy] Anaphylaxis, Hives and Swelling  . Other     Pt states "I carry an Epipen. I had anaphylactic reaction to something but I don't know what".    Social History   Socioeconomic History  . Marital status: Married    Spouse name: Not on file  . Number of children: Not on file  . Years of education: Not on file  . Highest education level: Not on file  Occupational History  . Not on file  Tobacco Use  . Smoking status: Former Smoker    Packs/day: 1.00    Years:  20.00    Pack years: 20.00    Types: Cigarettes    Quit date: 09/12/2016    Years since quitting: 4.3  . Smokeless tobacco: Never Used  Vaping Use  . Vaping Use: Every day  Substance and Sexual Activity  . Alcohol use: No  . Drug use: No  . Sexual activity: Yes    Birth control/protection: Surgical    Comment: tubal and ablation  Other Topics Concern  . Not on file  Social History Narrative  . Not on file   Social Determinants of Health   Financial Resource Strain: Not on file  Food Insecurity: Not on file  Transportation Needs: Not on file  Physical Activity: Not on  file  Stress: Not on file  Social Connections: Not on file  Intimate Partner Violence: Not on file       Review of Systems  Musculoskeletal: Positive for back pain.  All other systems reviewed and are negative.      Objective:   Physical Exam Vitals reviewed.  Constitutional:      General: She is not in acute distress.    Appearance: She is well-developed. She is not diaphoretic.  Cardiovascular:     Rate and Rhythm: Normal rate and regular rhythm.     Heart sounds: Normal heart sounds.  Pulmonary:     Effort: Pulmonary effort is normal. No respiratory distress.     Breath sounds: Normal breath sounds. No wheezing or rales.  Abdominal:     General: Bowel sounds are normal. There is no distension.     Palpations: Abdomen is soft.     Tenderness: There is no abdominal tenderness. There is no guarding or rebound.  Musculoskeletal:     Left shoulder: Tenderness present. No swelling, effusion or bony tenderness. Decreased range of motion. Normal strength.  Skin:    General: Skin is warm and dry.         Assessment & Plan:  Impingement syndrome of shoulder, left  I believe the patient has impingement syndrome due to subacromial bursitis and perhaps even tendinitis in the supraspinatus muscle.  Using sterile technique, I injected the subacromial space with 2 cc of lidocaine, 2 cc of Marcaine,  and 2 cc of 40 mg/mL Kenalog.  The patient tolerated the procedure well without complication.  She was already taking Celebrex without relief and therefore she had failed conservative therapy.  Recheck in 2 weeks if no better or sooner if worse

## 2021-01-09 ENCOUNTER — Ambulatory Visit (INDEPENDENT_AMBULATORY_CARE_PROVIDER_SITE_OTHER): Payer: Medicare Other

## 2021-01-09 ENCOUNTER — Ambulatory Visit (HOSPITAL_COMMUNITY): Payer: Medicare Other | Attending: Internal Medicine

## 2021-01-09 DIAGNOSIS — I10 Essential (primary) hypertension: Secondary | ICD-10-CM | POA: Insufficient documentation

## 2021-01-09 DIAGNOSIS — R55 Syncope and collapse: Secondary | ICD-10-CM

## 2021-01-09 DIAGNOSIS — I340 Nonrheumatic mitral (valve) insufficiency: Secondary | ICD-10-CM | POA: Insufficient documentation

## 2021-01-09 LAB — EXERCISE TOLERANCE TEST
Estimated workload: 10.1 METS
Exercise duration (min): 9 min
Exercise duration (sec): 0 s
MPHR: 181 {beats}/min
Peak HR: 171 {beats}/min
Percent HR: 94 %
RPE: 16
Rest HR: 75 {beats}/min

## 2021-01-09 LAB — ECHOCARDIOGRAM COMPLETE
Area-P 1/2: 3.63 cm2
MV M vel: 5.66 m/s
MV Peak grad: 128.1 mmHg
Radius: 0.45 cm
S' Lateral: 2.8 cm

## 2021-01-17 ENCOUNTER — Other Ambulatory Visit (HOSPITAL_COMMUNITY): Payer: Self-pay | Admitting: Family Medicine

## 2021-01-17 DIAGNOSIS — Z1231 Encounter for screening mammogram for malignant neoplasm of breast: Secondary | ICD-10-CM

## 2021-01-24 ENCOUNTER — Other Ambulatory Visit: Payer: Self-pay | Admitting: Family Medicine

## 2021-02-12 ENCOUNTER — Ambulatory Visit (HOSPITAL_COMMUNITY)
Admission: RE | Admit: 2021-02-12 | Discharge: 2021-02-12 | Disposition: A | Discharge status: Home / Self Care | Payer: Medicare Other | Source: Ambulatory Visit | Attending: Family Medicine | Admitting: Family Medicine

## 2021-02-12 DIAGNOSIS — Z1231 Encounter for screening mammogram for malignant neoplasm of breast: Secondary | ICD-10-CM | POA: Insufficient documentation

## 2021-02-23 ENCOUNTER — Ambulatory Visit: Payer: Medicare Other | Admitting: Internal Medicine

## 2021-03-24 ENCOUNTER — Other Ambulatory Visit: Payer: Self-pay | Admitting: Family Medicine

## 2021-03-26 ENCOUNTER — Encounter: Payer: Self-pay | Admitting: Family Medicine

## 2021-03-26 ENCOUNTER — Other Ambulatory Visit: Payer: Self-pay

## 2021-03-26 ENCOUNTER — Ambulatory Visit (INDEPENDENT_AMBULATORY_CARE_PROVIDER_SITE_OTHER): Payer: Medicare Other | Admitting: Family Medicine

## 2021-03-26 VITALS — BP 114/68 | HR 72 | Temp 97.5°F | Resp 16 | Ht 60.0 in | Wt 116.0 lb

## 2021-03-26 DIAGNOSIS — M25512 Pain in left shoulder: Secondary | ICD-10-CM | POA: Diagnosis not present

## 2021-03-26 DIAGNOSIS — G8929 Other chronic pain: Secondary | ICD-10-CM | POA: Diagnosis not present

## 2021-03-26 DIAGNOSIS — M25552 Pain in left hip: Secondary | ICD-10-CM | POA: Diagnosis not present

## 2021-03-26 MED ORDER — PREDNISONE 20 MG PO TABS: ORAL_TABLET | ORAL | 0 refills | Status: DC

## 2021-03-26 NOTE — Progress Notes (Signed)
Subjective:    Patient ID: Theresa Washington, female    DOB: Jun 17, 1981, 40 y.o.   MRN: [id number]  10/24/20 Patient states that since September, she is having frequent episodes of syncope or near syncope. The first episode that she remembers was in September. She states that she is embarrassed to report this, however after she had had vigorous intercourse with her husband, she went to the bathroom in order to clean up. She states that she was simply standing in the bathroom and she lost consciousness and fell to the floor. She states that her husband said she passed out 4 times. Every time she would try to get back up she would lose consciousness again. She attributed this to their activity prior. However since that time she states that she has not felt quite right. She states that on a regular basis now she is having episodes where she feels like she may pass out. One episode occurred when she went into the kitchen. She stood up to open female. She was walking to the kitchen and became extremely lightheaded and felt like she was going to pass out. She had a fall to her knees to prevent from blacking out. Today she was driving in a car to her mother's house. She had to pull over on the road to prevent from blacking out. She denies any seizure-like activity. She denies any loss of bowel or bladder incontinence. The 3 episodes that I mentioned above involve standing up after activity or position changes. 1 was standing up to go to the bathroom after intercourse. 1 was standing up to go into the kitchen to open female. 1 was getting out of a car to go into her mother's house. In all instances she started to feel like she was going to pass out. This suggests to me a drop in her blood pressure. She denies any bleeding or bruising or melena or hematochezia or vaginal bleeding.  At that time, my plan was: I suspect that the patient is having vasovagal episodes. I cannot rule out POTS syndrome. I believe the patient  would benefit from cardiology consultation for an echocardiogram as well as a cardiac monitor to evaluate for cardiac arrhythmias. Meanwhile I will check a CBC to evaluate for anemia and a CMP and a TSH to evaluate for any electrolyte or hormone abnormalities that can cause cardiac arrhythmias. EKG today shows normal sinus rhythm with normal intervals and a normal axis with no evidence of Wolff-Parkinson-White or long QT syndrome or Brugada syndrome. Spent more than 30 minutes today with the patient explaining the differential diagnosis and the work-up. However I suspect that she is having vasovagal episodes.  01/08/21 Patient presents today with pain in her left shoulder.  She states is been hurting for 4 weeks.  However over the last few days has become unbearable.  She states that it aches and throbs no matter what she does.  At night she has trouble sleeping due to the aching pain in her shoulder.  The pain is located below the clavicle in the subacromial space.  She also reports pain with abduction greater than approximately 60 degrees.  Even passive range of motion greater than 60 degrees elicits pain.  She has pain with empty can testing.  She has pain with Hawkins maneuver.  However she has normal strength and no evidence of any muscle weakness.  At that time, my plan was: I believe the patient has impingement syndrome due to subacromial bursitis and perhaps  even tendinitis in the supraspinatus muscle.  Using sterile technique, I injected the subacromial space with 2 cc of lidocaine, 2 cc of Marcaine, and 2 cc of 40 mg/mL Kenalog.  The patient tolerated the procedure well without complication.  She was already taking Celebrex without relief and therefore she had failed conservative therapy.  Recheck in 2 weeks if no better or sooner if worse  03/26/21 Patient presents today with 2 concerns.  First she continues to have pain in her left shoulder.  She states that the subacromial cortisone injection I  performed in February may have helped for 2 weeks then the pain came back immediately thereafter.  She reports pain with abduction.  She reports pain with internal and external rotation.  It hurts to sleep on her shoulder at night.  She reports an aching throbbing pain with activity.  Therefore I am concerned about a partial tear of the rotator cuff.  Second concern is pain in her left hip.  MRI showed a labral tear in 2021.  She had surgery at [facility] to repair the labral tear in her left hip.  Over the last 2 months she has developed pain in her left lower back roughly around the level of L5 just to the left of the lumbar spine however it radiates down into the anterior left hip.  She has pain with hip flexion as well as external rotation.  She also has tenderness to palpation in the lower back.  She has pain with resisted hip flexion.  She does complain of a an aching throb radiating down into her left posterior gluteus muscle.  She states the pain is similar to sciatica she has had in the past however its not going below her gluteus muscle at the present time Past Medical History:  Diagnosis Date  . Anemia   . Anxiety   . Back pain   . Bell's palsy   . Contraceptive management 11/30/2015  . DDD (degenerative disc disease)   . DDD (degenerative disc disease)   . Degenerative disc disease   . Fibromyalgia   . Fibromyalgia   . History of kidney stones   . Hypertension   . 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[place])   . Myositis   . Palpitations   . PVC (premature ventricular contraction)   . Scoliosis   . Seizures ([place])    unknown etiolgy; no meds and no seizures for 2 years.  . Smoker 11/30/2015  . Spondylosis    Past Surgical History:  Procedure Laterality Date  . DILATION AND CURETTAGE OF UTERUS  1998  . DILITATION & CURRETTAGE/HYSTROSCOPY WITH NOVASURE ABLATION N/A 01/16/2016   Procedure: HYSTEROSCOPY WITH NOVASURE ABLATION;  Surgeon: Jonnie Kind, MD;  Location: [facility];  Service: Gynecology;   Laterality: N/A;  Uterine Cavity Length 4.0 cm Uterine Cavity Width 3.3cm Power 73  Time 1 minute 17seconds  . LAPAROSCOPIC BILATERAL SALPINGECTOMY Bilateral 01/16/2016   Procedure: LAPAROSCOPIC BILATERAL SALPINGECTOMY;  Surgeon: Jonnie Kind, MD;  Location: [facility];  Service: Gynecology;  Laterality: Bilateral;  procedure 1  . OOPHORECTOMY  2006   Left side   Current Outpatient Medications on File Prior to Visit  Medication Sig Dispense Refill  . acetaminophen (TYLENOL) 500 MG tablet Take 500 mg by mouth every 6 (six) hours as needed.    . celecoxib (CELEBREX) 100 MG capsule TAKE 1 CAPSULE(100 MG) BY MOUTH TWICE DAILY 60 capsule 3  . diclofenac sodium (VOLTAREN) 1 % GEL Apply topically as needed.    [name]  EPINEPHrine (EPIPEN JR) 0.15 MG/0.3ML injection Inject 0.15 mg into the muscle as needed for anaphylaxis.    [name] methylcellulose (ARTIFICIAL TEARS) 1 % ophthalmic solution Place 1 drop into both eyes daily as needed. Dry Eyes    . topiramate (TOPAMAX) 25 MG tablet TAKE 2 TABLETS(50 MG) BY MOUTH TWICE DAILY 360 tablet 1  . traMADol (ULTRAM) 50 MG tablet TAKE 1 TABLET(50 MG) BY MOUTH EVERY 8 HOURS AS NEEDED 60 tablet 0   No current facility-administered medications on file prior to visit.   Allergies  Allergen Reactions  . Shrimp [Shellfish Allergy] Anaphylaxis, Hives and Swelling  . Other     Pt states "I carry an Epipen. I had anaphylactic reaction to something but I don't know what".    Social History   Socioeconomic History  . Marital status: Married    Spouse name: Not on file  . Number of children: Not on file  . Years of education: Not on file  . Highest education level: Not on file  Occupational History  . Not on file  Tobacco Use  . Smoking status: Former Smoker    Packs/day: 1.00    Years: 20.00    Pack years: 20.00    Types: Cigarettes    Quit date: 09/12/2016    Years since quitting: 4.5  . Smokeless tobacco: Never Used  Vaping Use  . Vaping Use: Every day   Substance and Sexual Activity  . Alcohol use: No  . Drug use: No  . Sexual activity: Yes    Birth control/protection: Surgical    Comment: tubal and ablation  Other Topics Concern  . Not on file  Social History Narrative  . Not on file   Social Determinants of Health   Financial Resource Strain: Not on file  Food Insecurity: Not on file  Transportation Needs: Not on file  Physical Activity: Not on file  Stress: Not on file  Social Connections: Not on file  Intimate Partner Violence: Not on file       Review of Systems  Musculoskeletal: Positive for back pain.  All other systems reviewed and are negative.      Objective:   Physical Exam Vitals reviewed.  Constitutional:      General: She is not in acute distress.    Appearance: She is well-developed. She is not diaphoretic.  Cardiovascular:     Rate and Rhythm: Normal rate and regular rhythm.     Heart sounds: Normal heart sounds.  Pulmonary:     Effort: Pulmonary effort is normal. No respiratory distress.     Breath sounds: Normal breath sounds. No wheezing or rales.  Abdominal:     General: Bowel sounds are normal. There is no distension.     Palpations: Abdomen is soft.     Tenderness: There is no abdominal tenderness. There is no guarding or rebound.  Musculoskeletal:     Left shoulder: Tenderness present. No swelling, effusion or bony tenderness. Decreased range of motion. Normal strength.     Lumbar back: Spasms and tenderness present. Decreased range of motion.       Back:     Left hip: Tenderness present. No bony tenderness or crepitus. Normal range of motion. Decreased strength.  Skin:    General: Skin is warm and dry.         Assessment & Plan:  Chronic left shoulder pain  Posterior pain of hip, left  Proceed with an MRI of the left shoulder to evaluate for the  presence of rotator cuff tear.  Regarding the pain in her posterior hip, this is either sciatica or hip pathology.  Begin with a  prednisone taper pack and if the symptoms improve dramatically, I suspect possibly a bulging disc causing sciatica.  If the prednisone does not help, I will refer the patient back to her orthopedic surgeon who performed her labral repair on the left hip in 2021

## 2021-04-08 ENCOUNTER — Ambulatory Visit
Admission: RE | Admit: 2021-04-08 | Discharge: 2021-04-08 | Disposition: A | Discharge status: Home / Self Care | Payer: Medicare Other | Source: Ambulatory Visit | Attending: Family Medicine | Admitting: Family Medicine

## 2021-04-08 ENCOUNTER — Other Ambulatory Visit: Payer: Self-pay

## 2021-04-08 DIAGNOSIS — M25512 Pain in left shoulder: Secondary | ICD-10-CM

## 2021-04-08 DIAGNOSIS — G8929 Other chronic pain: Secondary | ICD-10-CM

## 2021-04-08 DIAGNOSIS — M75102 Unspecified rotator cuff tear or rupture of left shoulder, not specified as traumatic: Secondary | ICD-10-CM | POA: Diagnosis not present

## 2021-04-12 ENCOUNTER — Other Ambulatory Visit: Payer: Self-pay | Admitting: *Deleted

## 2021-04-12 DIAGNOSIS — G8929 Other chronic pain: Secondary | ICD-10-CM

## 2021-04-12 DIAGNOSIS — M25512 Pain in left shoulder: Secondary | ICD-10-CM

## 2021-04-12 DIAGNOSIS — M75102 Unspecified rotator cuff tear or rupture of left shoulder, not specified as traumatic: Secondary | ICD-10-CM

## 2021-04-12 DIAGNOSIS — M7542 Impingement syndrome of left shoulder: Secondary | ICD-10-CM

## 2021-04-30 ENCOUNTER — Ambulatory Visit: Payer: Medicare Other | Admitting: Orthopedic Surgery

## 2021-04-30 ENCOUNTER — Other Ambulatory Visit: Payer: Self-pay

## 2021-04-30 DIAGNOSIS — M75102 Unspecified rotator cuff tear or rupture of left shoulder, not specified as traumatic: Secondary | ICD-10-CM

## 2021-05-03 ENCOUNTER — Encounter: Payer: Self-pay | Admitting: Orthopedic Surgery

## 2021-05-03 NOTE — Progress Notes (Signed)
Office Visit Note   [name]           Date of Birth: 1981-11-07           MRN: [id number] Visit Date: 04/30/2021 Requested by: Susy Frizzle, MD [address] [place],  [place] [address] PCP: Susy Frizzle, MD  Subjective: Chief Complaint  Patient presents with   Left Shoulder - Pain    HPI: Theresa Washington is a 40 y.o. female who presents to the office complaining of left shoulder pain.  Patient complains of chronic pain with no known injury.  She has had several years of pain.  No history of shoulder surgery, dislocation, neck surgery.  Localizes pain to the lateral aspect of the left shoulder with occasional radiation into the clavicle as well as posterior radiation down the arm into the elbow.  She has numbness and tingling every day in the ulnar forearm in particular.  She is not able to lay on her left side due to the shoulder pain she experiences.  She wakes with pain most nights.  Denies any neck pain.  She had 40% relief of her left shoulder pain with subacromial injection that lasted for 2 weeks.  She takes Celebrex 200 mg twice daily as well as Tylenol 500 mg twice daily with no lasting relief.  She has seen a neurologist in 2013 or 2014 and had a nerve conduction study that was inconclusive according to her.  She also reports weakness in her hand which involves dropping objects but this is more in the afternoon.  Her mom has a history of MS.  She has a history of ankylosing spondylosis, fibromyalgia, degenerative disc disease.  Denies any history of psoriasis or rheumatoid arthritis. .                ROS: All systems reviewed are negative as they relate to the chief complaint within the history of present illness.  Patient denies fevers or chills.  Assessment & Plan: Visit Diagnoses:  1. Tear of left supraspinatus tendon     Plan: Patient is a 40 year old female who has history of several years of left shoulder pain.  She has failed  physical therapy that provided no relief.  She also has had prior injections that helped for 30 to 50% but these do not provide lasting relief.  No weakness on rotator cuff exam today but she does have increased pain with supraspinatus testing.  She had MRI of the left shoulder that revealed supraspinatus and infraspinatus tendinopathy with 0.4 cm from front to back undersurface tear of the anterior and far lateral supraspinatus without retraction or atrophy.  Discussed options available to patient.  She would like to proceed with surgery as she has failed conservative management for several years and wants to definitively treat her shoulder pain.  Discussed the risks and benefits of surgery including risk of nerve/vessel damage, shoulder stiffness, continued shoulder weakness and pain, need for revision surgery, medical complication from surgery.  Patient agreed with plan.  Follow-up after procedure.  Patient has had extensive and exhaustive nonoperative treatment with continued pain and symptoms.  Essentially the only thing left at this time would be operative intervention for rotator cuff repair.  We should be able to rehabilitate her fairly aggressively and quickly based on the size of the tear.  Patient understands risk and benefits.  All questions answered.  Follow-Up Instructions: No follow-ups on file.   Orders:  No orders  of the defined types were placed in this encounter.  No orders of the defined types were placed in this encounter.     Procedures: No procedures performed   Clinical Data: No additional findings.  Objective: Vital Signs: There were no vitals taken for this visit.  Physical Exam:  Constitutional: Patient appears well-developed HEENT:  Head: Normocephalic Eyes:EOM are normal Neck: Normal range of motion Cardiovascular: Normal rate Pulmonary/chest: Effort normal Neurologic: Patient is alert Skin: Skin is warm Psychiatric: Patient has normal mood and  affect  Ortho Exam: Ortho exam demonstrates right shoulder with 60 degrees external rotation, 110 degrees abduction, 180 degrees forward flexion.  Left shoulder with 70 degrees external rotation, 110 degrees abduction, 180 degrees forward flexion.  Excellent rotator cuff strength of supraspinatus, infraspinatus, subscapularis of the left shoulder.  Increased pain elicited with supraspinatus resistance testing.  Tenderness to palpation over the [facility] joint and bicipital groove.  Active range of motion of the left shoulder is equivalent to passive range of motion.  Negative drop arm test.  Negative Hornblower sign.  No pain with cervical spine range of motion.  Negative Spurling sign.  No tenderness over the axial cervical spine.  5/5 motor strength of bilateral grip strength, pronation/supination, bicep, tricep, deltoid.  No subluxing ulnar nerve.  positive elbow flexion test.  Positive Froment's sign.  5 -/5 strength of finger abduction on the left with 5/5 strength on the right.  No muscle wasting noted throughout the bilateral hands.  Imaging: No results found.   PMFS History: Patient Active Problem List   Diagnosis Date Noted   Essential hypertension 11/24/2020   Nonrheumatic mitral valve regurgitation 11/24/2020   Smoker 11/30/2015   Palpitations 04/27/2014   Vasovagal syncope 04/26/2014   Hip pain 12/12/2013   Fibromyalgia 02/10/2013   Somatization disorder 02/10/2013   Pseudoseizures ([place]) 02/10/2013   DDD (degenerative disc disease), lumbosacral 02/10/2013   Spells 02/25/2012   History of idiopathic seizure 10/14/2011   SMOKER 05/20/2010   CHRONIC OBSTRUCTIVE PULMONARY DISEASE 05/20/2010   SHOULDER PAIN 10/27/2008   NECK PAIN 10/27/2008   Past Medical History:  Diagnosis Date   Anemia    Anxiety    Back pain    Bell's palsy    Contraceptive management 11/30/2015   DDD (degenerative disc disease)    DDD (degenerative disc disease)    Degenerative disc disease    Fibromyalgia     Fibromyalgia    History of kidney stones    Hypertension    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[facility])    Myositis    Palpitations    PVC (premature ventricular contraction)    Scoliosis    Seizures ([place])    unknown etiolgy; no meds and no seizures for 2 years.   Smoker 11/30/2015   Spondylosis     Family History  Problem Relation Age of Onset   Heart disease Father    Kidney disease Father    Alcohol abuse Brother    Mental retardation Brother    Schizophrenia Brother    Colon cancer Maternal Grandmother    Multiple sclerosis Mother    Other Brother        back problems    Past Surgical History:  Procedure Laterality Date   DILATION AND CURETTAGE OF UTERUS  1998   DILITATION & CURRETTAGE/HYSTROSCOPY WITH NOVASURE ABLATION N/A 01/16/2016   Procedure: HYSTEROSCOPY WITH NOVASURE ABLATION;  Surgeon: Jonnie Kind, MD;  Location: [facility];  Service: Gynecology;  Laterality: N/A;  Uterine Cavity Length 4.0  cm Uterine Cavity Width 3.3cm Power 73  Time 1 minute 17seconds   LAPAROSCOPIC BILATERAL SALPINGECTOMY Bilateral 01/16/2016   Procedure: LAPAROSCOPIC BILATERAL SALPINGECTOMY;  Surgeon: Jonnie Kind, MD;  Location: [facility];  Service: Gynecology;  Laterality: Bilateral;  procedure 1   OOPHORECTOMY  2006   Left side   Social History   Occupational History   Not on file  Tobacco Use   Smoking status: Former    Packs/day: 1.00    Years: 20.00    Pack years: 20.00    Types: Cigarettes    Quit date: 09/12/2016    Years since quitting: 4.6   Smokeless tobacco: Never  Vaping Use   Vaping Use: Every day  Substance and Sexual Activity   Alcohol use: No   Drug use: No   Sexual activity: Yes    Birth control/protection: Surgical    Comment: tubal and ablation

## 2021-05-04 ENCOUNTER — Encounter: Payer: Self-pay | Admitting: Orthopedic Surgery

## 2021-06-03 ENCOUNTER — Other Ambulatory Visit: Payer: Self-pay | Admitting: Orthopedic Surgery

## 2021-06-03 MED ORDER — METHOCARBAMOL 500 MG PO TABS: 500.0000 mg | ORAL_TABLET | Freq: Three times a day (TID) | ORAL | 0 refills | Status: DC | PRN

## 2021-06-03 MED ORDER — OXYCODONE HCL 5 MG PO CAPS: 5.0000 mg | ORAL_CAPSULE | ORAL | 0 refills | Status: DC | PRN

## 2021-06-04 ENCOUNTER — Encounter: Payer: Self-pay | Admitting: Orthopedic Surgery

## 2021-06-04 ENCOUNTER — Telehealth: Payer: Self-pay | Admitting: Orthopedic Surgery

## 2021-06-04 ENCOUNTER — Telehealth: Payer: Self-pay

## 2021-06-04 DIAGNOSIS — M24112 Other articular cartilage disorders, left shoulder: Secondary | ICD-10-CM | POA: Diagnosis not present

## 2021-06-04 DIAGNOSIS — M75122 Complete rotator cuff tear or rupture of left shoulder, not specified as traumatic: Secondary | ICD-10-CM | POA: Diagnosis not present

## 2021-06-04 DIAGNOSIS — G8918 Other acute postprocedural pain: Secondary | ICD-10-CM | POA: Diagnosis not present

## 2021-06-04 DIAGNOSIS — S43432A Superior glenoid labrum lesion of left shoulder, initial encounter: Secondary | ICD-10-CM | POA: Diagnosis not present

## 2021-06-04 DIAGNOSIS — S43432D Superior glenoid labrum lesion of left shoulder, subsequent encounter: Secondary | ICD-10-CM | POA: Diagnosis not present

## 2021-06-04 HISTORY — PX: SHOULDER ARTHROSCOPY: SHX128

## 2021-06-04 MED ORDER — OXYCODONE HCL 5 MG PO TABS: 5.0000 mg | ORAL_TABLET | ORAL | 0 refills | Status: DC | PRN

## 2021-06-04 NOTE — Telephone Encounter (Signed)
Patients spouse called he stated the pharmacy told him the rx for oxycodone needs to be in gel form he in requesting rx to be sent to [facility] [phone] - [place], [address] AT [address]  [address], [place] [address]   call back:[phone]

## 2021-06-04 NOTE — Telephone Encounter (Signed)
Pt called requesting a call back about medication. Pt states she called and spoke to a nurse stating that they would take care of getting pre authorization. Pt also states that her insurance will not cover the gel oxycodone and will be ok with the tablets. Please send to the [facility] in last note. Please call pt as soon as possible about this matter at [phone].

## 2021-06-04 NOTE — Telephone Encounter (Signed)
See other note

## 2021-06-04 NOTE — Telephone Encounter (Signed)
Resubmitted for tablets. Patient notified done. Cancelled rx for capsules

## 2021-06-04 NOTE — Addendum Note (Signed)
Addended by: Marcene Duos on: 06/04/2021 04:37 PM   Modules accepted: Orders

## 2021-06-05 DIAGNOSIS — M7542 Impingement syndrome of left shoulder: Secondary | ICD-10-CM | POA: Diagnosis not present

## 2021-06-11 ENCOUNTER — Other Ambulatory Visit: Payer: Self-pay

## 2021-06-11 ENCOUNTER — Ambulatory Visit (INDEPENDENT_AMBULATORY_CARE_PROVIDER_SITE_OTHER): Payer: Medicare Other | Admitting: Orthopedic Surgery

## 2021-06-11 DIAGNOSIS — M75102 Unspecified rotator cuff tear or rupture of left shoulder, not specified as traumatic: Secondary | ICD-10-CM

## 2021-06-14 ENCOUNTER — Encounter: Payer: Self-pay | Admitting: Orthopedic Surgery

## 2021-06-14 MED ORDER — OXYCODONE HCL 5 MG PO CAPS: 5.0000 mg | ORAL_CAPSULE | ORAL | 0 refills | Status: DC | PRN

## 2021-06-14 MED ORDER — METHOCARBAMOL 500 MG PO TABS: 500.0000 mg | ORAL_TABLET | Freq: Three times a day (TID) | ORAL | 0 refills | Status: DC | PRN

## 2021-06-14 NOTE — Progress Notes (Signed)
Post-Op Visit Note   [name]           Date of Birth: 1980/11/30           MRN: [id number] Visit Date: 06/11/2021 PCP: Susy Frizzle, MD   Assessment & Plan:  Chief Complaint:  Chief Complaint  Patient presents with   Left Shoulder - Routine Post Op   Visit Diagnoses:  1. Tear of left supraspinatus tendon     Plan: Patient presents now a week out left shoulder arthroscopy with biceps tenodesis mini open rotator cuff tear repair.  CPM machine at 75 degrees.  She is doing 3 times a day.  Sutures intact and removed.  She is off pain meds.  She has really been stopping sling use on her own but not lifting anything.  On exam she is got pretty reasonable passive range of motion with no coarse grinding or crepitus.  4-week return and will start strengthening in.  Continue with with therapy for range of motion only continue with back brace CPM machine 3 times a day. Up Instructions: Return in about 4 weeks (around 07/09/2021).   Orders:  No orders of the defined types were placed in this encounter.  Meds ordered this encounter  Medications   oxycodone (OXY-IR) 5 MG capsule    Sig: Take 1 capsule (5 mg total) by mouth every 4 (four) hours as needed.    Dispense:  30 capsule    Refill:  0   methocarbamol (ROBAXIN) 500 MG tablet    Sig: Take 1 tablet (500 mg total) by mouth every 8 (eight) hours as needed for muscle spasms.    Dispense:  30 tablet    Refill:  0    Imaging: No results found.  PMFS History: Patient Active Problem List   Diagnosis Date Noted   Essential hypertension 11/24/2020   Nonrheumatic mitral valve regurgitation 11/24/2020   Smoker 11/30/2015   Palpitations 04/27/2014   Vasovagal syncope 04/26/2014   Hip pain 12/12/2013   Fibromyalgia 02/10/2013   Somatization disorder 02/10/2013   Pseudoseizures ([place]) 02/10/2013   DDD (degenerative disc disease), lumbosacral 02/10/2013   Spells 02/25/2012   History of idiopathic seizure  10/14/2011   SMOKER 05/20/2010   CHRONIC OBSTRUCTIVE PULMONARY DISEASE 05/20/2010   SHOULDER PAIN 10/27/2008   NECK PAIN 10/27/2008   Past Medical History:  Diagnosis Date   Anemia    Anxiety    Back pain    Bell's palsy    Contraceptive management 11/30/2015   DDD (degenerative disc disease)    DDD (degenerative disc disease)    Degenerative disc disease    Fibromyalgia    Fibromyalgia    History of kidney stones    Hypertension    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[facility])    Myositis    Palpitations    PVC (premature ventricular contraction)    Scoliosis    Seizures ([place])    unknown etiolgy; no meds and no seizures for 2 years.   Smoker 11/30/2015   Spondylosis     Family History  Problem Relation Age of Onset   Heart disease Father    Kidney disease Father    Alcohol abuse Brother    Mental retardation Brother    Schizophrenia Brother    Colon cancer Maternal Grandmother    Multiple sclerosis Mother    Other Brother        back problems    Past Surgical History:  Procedure Laterality Date   DILATION  AND CURETTAGE OF UTERUS  1998   DILITATION & CURRETTAGE/HYSTROSCOPY WITH NOVASURE ABLATION N/A 01/16/2016   Procedure: HYSTEROSCOPY WITH NOVASURE ABLATION;  Surgeon: Jonnie Kind, MD;  Location: [facility];  Service: Gynecology;  Laterality: N/A;  Uterine Cavity Length 4.0 cm Uterine Cavity Width 3.3cm Power 73  Time 1 minute 17seconds   LAPAROSCOPIC BILATERAL SALPINGECTOMY Bilateral 01/16/2016   Procedure: LAPAROSCOPIC BILATERAL SALPINGECTOMY;  Surgeon: Jonnie Kind, MD;  Location: [facility];  Service: Gynecology;  Laterality: Bilateral;  procedure 1   OOPHORECTOMY  2006   Left side   Social History   Occupational History   Not on file  Tobacco Use   Smoking status: Former    Packs/day: 1.00    Years: 20.00    Pack years: 20.00    Types: Cigarettes    Quit date: 09/12/2016    Years since quitting: 4.7   Smokeless tobacco: Never  Vaping Use   Vaping Use: Every day  Substance  and Sexual Activity   Alcohol use: No   Drug use: No   Sexual activity: Yes    Birth control/protection: Surgical    Comment: tubal and ablation

## 2021-06-28 NOTE — Progress Notes (Signed)
Subjective:    Patient ID: Theresa Washington, female    DOB: [birth year], 40 y.o.   MRN: [id number]  HPI: Theresa Washington is a 40 y.o. female presenting for bilateral joint pain  Chief Complaint  Patient presents with   Pain    Pain in joints since surgery. Pain in mostly hips, knees and elbows. Taking tylenol '500mg'$  bid, celebrex and robaxin, no reliel. Havig trouble sleeping. Surgery 7/18   ARTHRALGIAS / JOINT ACHES Duration: years Pain: yes Symmetric: yes Severity: severe Quality: tender,  Frequency: constant Context:  worse Decreased function/range of motion: yes Erythema: yes - hips Swelling: yes Heat or warmth: yes Morning stiffness: yes; takes 1.5-2 hours before quits Aggravating factors: any movement, walking, using joints Alleviating factors: Celebrex, Robaxin Relief with NSAIDs?: some Treatments attempted:  Celebrex, Robasin Involved Joints:     Hands: yes; bilateral    Wrists: yes; bilateral      Elbows: yes; bilateral     Shoulders: yes bilateral    Back: yes ; upper between shoulder blades and lower back sacral joint    Hips: yes; bilateral    Knees: yes bilateral    Ankles: no     Feet: no  Allergies  Allergen Reactions   Shrimp [Shellfish Allergy] Anaphylaxis, Hives and Swelling   Other     Pt states "I carry an Epipen. I had anaphylactic reaction to something but I don't know what".     Outpatient Encounter Medications as of 06/29/2021  Medication Sig Note   acetaminophen (TYLENOL) 500 MG tablet Take 500 mg by mouth every 6 (six) hours as needed.    celecoxib (CELEBREX) 100 MG capsule TAKE 1 CAPSULE(100 MG) BY MOUTH TWICE DAILY    diclofenac sodium (VOLTAREN) 1 % GEL Apply topically as needed.    EPINEPHrine (EPIPEN JR) 0.15 MG/0.3ML injection Inject 0.15 mg into the muscle as needed for anaphylaxis.    methocarbamol (ROBAXIN) 500 MG tablet Take 1 tablet (500 mg total) by mouth every 8 (eight) hours as needed for muscle spasms.     methylcellulose (ARTIFICIAL TEARS) 1 % ophthalmic solution Place 1 drop into both eyes daily as needed. Dry Eyes    oxyCODONE (OXY IR/ROXICODONE) 5 MG immediate release tablet Take 1 tablet (5 mg total) by mouth every 4 (four) hours as needed for severe pain.    oxycodone (OXY-IR) 5 MG capsule Take 1 capsule (5 mg total) by mouth every 4 (four) hours as needed. 06/29/2021: prn   predniSONE (DELTASONE) 10 MG tablet Take 1 tablet (10 mg total) by mouth daily with breakfast. Take '40mg'$  on days 1-2. Take '30mg'$  on days 3-4. Take '20mg'$  on days 5-6. Take '10mg'$  on days 7-8. Take '5mg'$  on days 9-10, then stop.    topiramate (TOPAMAX) 25 MG tablet TAKE 2 TABLETS(50 MG) BY MOUTH TWICE DAILY    [DISCONTINUED] methocarbamol (ROBAXIN) 500 MG tablet Take 1 tablet (500 mg total) by mouth every 8 (eight) hours as needed for muscle spasms.    [DISCONTINUED] predniSONE (DELTASONE) 20 MG tablet 3 tabs poqday 1-2, 2 tabs poqday 3-4, 1 tab poqday 5-6    [DISCONTINUED] traMADol (ULTRAM) 50 MG tablet TAKE 1 TABLET(50 MG) BY MOUTH EVERY 8 HOURS AS NEEDED    No facility-administered encounter medications on file as of 06/29/2021.    Patient Active Problem List   Diagnosis Date Noted   Essential hypertension 11/24/2020   Nonrheumatic mitral valve regurgitation 11/24/2020   Smoker 11/30/2015   Palpitations 04/27/2014  Vasovagal syncope 04/26/2014   Hip pain 12/12/2013   Fibromyalgia 02/10/2013   Somatization disorder 02/10/2013   Pseudoseizures ([place]) 02/10/2013   DDD (degenerative disc disease), lumbosacral 02/10/2013   Spells 02/25/2012   History of idiopathic seizure 10/14/2011   SMOKER 05/20/2010   CHRONIC OBSTRUCTIVE PULMONARY DISEASE 05/20/2010   SHOULDER PAIN 10/27/2008   NECK PAIN 10/27/2008    Past Medical History:  Diagnosis Date   Anemia    Anxiety    Back pain    Bell's palsy    Contraceptive management 11/30/2015   DDD (degenerative disc disease)    DDD (degenerative disc disease)    Degenerative  disc disease    Fibromyalgia    Fibromyalgia    History of kidney stones    Hypertension    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HCC)    Myositis    Palpitations    PVC (premature ventricular contraction)    Scoliosis    Seizures ([place])    unknown etiolgy; no meds and no seizures for 2 years.   Smoker 11/30/2015   Spondylosis     Relevant past medical, surgical, family and social history reviewed and updated as indicated. Interim medical history since our last visit reviewed.  Review of Systems Per HPI unless specifically indicated above     Objective:    BP 126/70   Pulse 84   Temp 98.4 F (36.9 C)   Ht 5' (1.524 m)   Wt 113 lb 9.6 oz (51.5 kg)   SpO2 100%   BMI 22.19 kg/m   Wt Readings from Last 3 Encounters:  06/29/21 113 lb 9.6 oz (51.5 kg)  03/26/21 116 lb (52.6 kg)  01/08/21 117 lb 8 oz (53.3 kg)    Physical Exam Vitals and nursing note reviewed.  Constitutional:      General: She is not in acute distress.    Appearance: Normal appearance. She is not toxic-appearing.  Eyes:     General: No scleral icterus.    Extraocular Movements: Extraocular movements intact.  Musculoskeletal:     Right shoulder: Bony tenderness present. No swelling or deformity. Normal range of motion.     Left shoulder: No tenderness or bony tenderness. Decreased range of motion.     Right elbow: Tenderness present.     Left elbow: Tenderness present.       Arms:     Right knee: Normal range of motion. Tenderness present.     Left knee: Normal range of motion. Tenderness present.     Right lower leg: No edema.     Left lower leg: No edema.       Legs:  Neurological:     Mental Status: She is alert and oriented to person, place, and time.  Psychiatric:        Mood and Affect: Mood normal.        Behavior: Behavior normal.        Thought Content: Thought content normal.        Judgment: Judgment normal.      Assessment & Plan:  1. Chronic arthralgias of knees and hips Chronic.  Given morning  stiffness in bilateral joints in multiple areas, will check sedimentation rate, C-reactive protein, Rh factor, and ANA to rule out autoimmune cause.  I do not appreciate joint effusions today.  Pain could also be related to fibromyalgia.  Will treat with prednisone taper pack and follow up if symptoms persist or pending blood work.   - CBC with Differential - Sedimentation Rate -  C-reactive protein - Rheumatoid factor - ANA  2. Fibromyalgia Chronic.  Given morning stiffness in bilateral joints in multiple areas, will check sedimentation rate, C-reactive protein, Rh factor, and ANA to rule out autoimmune cause.  I do not appreciate joint effusions today.  Pain could also be related to fibromyalgia.  Will treat with prednisone taper pack and follow up if symptoms persist or pending blood work.       Follow up plan: Return if symptoms worsen or fail to improve, for pending lab work.

## 2021-06-29 ENCOUNTER — Ambulatory Visit (INDEPENDENT_AMBULATORY_CARE_PROVIDER_SITE_OTHER): Payer: Medicare Other | Admitting: Nurse Practitioner

## 2021-06-29 ENCOUNTER — Encounter: Payer: Self-pay | Admitting: Nurse Practitioner

## 2021-06-29 ENCOUNTER — Other Ambulatory Visit: Payer: Self-pay

## 2021-06-29 VITALS — BP 126/70 | HR 84 | Temp 98.4°F | Ht 60.0 in | Wt 113.6 lb

## 2021-06-29 DIAGNOSIS — M25562 Pain in left knee: Secondary | ICD-10-CM

## 2021-06-29 DIAGNOSIS — M25561 Pain in right knee: Secondary | ICD-10-CM | POA: Diagnosis not present

## 2021-06-29 DIAGNOSIS — M25552 Pain in left hip: Secondary | ICD-10-CM

## 2021-06-29 DIAGNOSIS — G8929 Other chronic pain: Secondary | ICD-10-CM

## 2021-06-29 DIAGNOSIS — M797 Fibromyalgia: Secondary | ICD-10-CM

## 2021-06-29 DIAGNOSIS — M25551 Pain in right hip: Secondary | ICD-10-CM

## 2021-06-29 MED ORDER — PREDNISONE 10 MG PO TABS: 10.0000 mg | ORAL_TABLET | Freq: Every day | ORAL | 0 refills | Status: DC

## 2021-07-01 LAB — CBC WITH DIFFERENTIAL/PLATELET
Absolute Monocytes: 511 cells/uL (ref 200–950)
Basophils Absolute: 22 cells/uL (ref 0–200)
Basophils Relative: 0.3 %
Eosinophils Absolute: 58 cells/uL (ref 15–500)
Eosinophils Relative: 0.8 %
HCT: 36.8 % (ref 35.0–45.0)
Hemoglobin: 12.4 g/dL (ref 11.7–15.5)
Lymphs Abs: 1865 cells/uL (ref 850–3900)
MCH: 31.2 pg (ref 27.0–33.0)
MCHC: 33.7 g/dL (ref 32.0–36.0)
MCV: 92.5 fL (ref 80.0–100.0)
MPV: 10.1 fL (ref 7.5–12.5)
Monocytes Relative: 7.1 %
Neutro Abs: 4745 cells/uL (ref 1500–7800)
Neutrophils Relative %: 65.9 %
Platelets: 379 10*3/uL (ref 140–400)
RBC: 3.98 10*6/uL (ref 3.80–5.10)
RDW: 12.2 % (ref 11.0–15.0)
Total Lymphocyte: 25.9 %
WBC: 7.2 10*3/uL (ref 3.8–10.8)

## 2021-07-01 LAB — ANA: Anti Nuclear Antibody (ANA): NEGATIVE

## 2021-07-01 LAB — RHEUMATOID FACTOR: Rheumatoid fact SerPl-aCnc: 50 IU/mL — ABNORMAL HIGH (ref <14)

## 2021-07-01 LAB — C-REACTIVE PROTEIN: CRP: 0.3 mg/L (ref <8.0)

## 2021-07-01 LAB — SEDIMENTATION RATE: Sed Rate: 11 mm/h (ref 0–20)

## 2021-07-04 ENCOUNTER — Other Ambulatory Visit: Payer: Self-pay | Admitting: *Deleted

## 2021-07-04 DIAGNOSIS — M25562 Pain in left knee: Secondary | ICD-10-CM

## 2021-07-04 DIAGNOSIS — G8929 Other chronic pain: Secondary | ICD-10-CM

## 2021-07-04 DIAGNOSIS — M0580 Other rheumatoid arthritis with rheumatoid factor of unspecified site: Secondary | ICD-10-CM

## 2021-07-09 ENCOUNTER — Encounter: Payer: Self-pay | Admitting: Orthopedic Surgery

## 2021-07-09 ENCOUNTER — Ambulatory Visit (INDEPENDENT_AMBULATORY_CARE_PROVIDER_SITE_OTHER): Payer: Medicare Other | Admitting: Orthopedic Surgery

## 2021-07-09 ENCOUNTER — Other Ambulatory Visit: Payer: Self-pay

## 2021-07-09 DIAGNOSIS — M75102 Unspecified rotator cuff tear or rupture of left shoulder, not specified as traumatic: Secondary | ICD-10-CM

## 2021-07-09 NOTE — Progress Notes (Signed)
Office Visit Note  [name]             Date of Birth: August 26, 1981           MRN: [id number]             PCP: Susy Frizzle, MD Referring: Eulogio Bear, NP Visit Date: 07/10/2021  Subjective:  New Patient (Initial Visit) (Patient complains of bilateral shoulder pain (recent L shoulder surgery- 06/04/2021), bilateral hips, and bilateral medial knee pain.)   History of Present Illness: Theresa Washington is a 40 y.o. female here for chronic joint of multiple sites with positive rheumatoid factor. She has history of left shoulder arthroscopy for rotator cuff tear and let hip arthroscopy for labral tear. Joint pains are mostly chronic worst in the shoulders, elbows, hips, and knees bilaterally. These were evaluated years ago with negative serology and attributed to her injury changes and fibromyalgia syndrome. She previously developed high increases in inflammatory markers despite negative antibody tests. Pain has gotten worse some in the shoulders and hips treated with orthopedics but now also more pain and swelling in elbows. She is stiff for more than an hour in mornings but also has worsening pain with heavier use. She gets the most relief from prednisone and has taken intermittent short tapering doses with good relief but when recurrence afterwards. Individuals flares or episodes lasting about 7-10 days typically. Most recent treatment finished 2 days ago.  Labs reviewed 06/2021 RF 50 ANA neg ESR 11 CRP neg CBC wnl  Activities of Daily Living:  Patient reports morning stiffness for 1.5 hours.   Patient Reports nocturnal pain.  Difficulty dressing/grooming: Reports Difficulty climbing stairs: Reports Difficulty getting out of chair: Reports Difficulty using hands for taps, buttons, cutlery, and/or writing: Reports  Review of Systems  Constitutional:  Positive for fatigue.  HENT:  Positive for mouth sores. Negative for mouth dryness and nose dryness.    Eyes:  Positive for pain, itching, visual disturbance and dryness.  Respiratory:  Positive for cough. Negative for hemoptysis, shortness of breath and difficulty breathing.   Cardiovascular:  Negative for chest pain, palpitations and swelling in legs/feet.  Gastrointestinal:  Negative for abdominal pain, blood in stool, constipation and diarrhea.  Endocrine: Positive for increased urination.  Genitourinary:  Negative for painful urination.  Musculoskeletal:  Positive for joint pain, joint pain, joint swelling, myalgias, muscle weakness, morning stiffness, muscle tenderness and myalgias.  Skin:  Negative for color change, rash and redness.  Allergic/Immunologic: Negative for susceptible to infections.  Neurological:  Positive for headaches. Negative for dizziness, numbness, memory loss and weakness.  Hematological:  Negative for swollen glands.  Psychiatric/Behavioral:  Positive for sleep disturbance. Negative for confusion.    PMFS History:  Patient Active Problem List   Diagnosis Date Noted   Rheumatoid factor positive 07/10/2021   High risk medication use 07/10/2021   Essential hypertension 11/24/2020   Nonrheumatic mitral valve regurgitation 11/24/2020   Sacroiliac joint pain 01/12/2020   Smoker 11/30/2015   Palpitations 04/27/2014   Vasovagal syncope 04/26/2014   Spondylosis 02/24/2014   Hip pain 12/12/2013   Fibromyalgia 02/10/2013   Somatization disorder 02/10/2013   Pseudoseizures ([place]) 02/10/2013   DDD (degenerative disc disease), lumbosacral 02/10/2013   Spells 02/25/2012   History of idiopathic seizure 10/14/2011   SMOKER 05/20/2010   COPD (chronic obstructive pulmonary disease) ([place]) 05/20/2010   SHOULDER PAIN 10/27/2008   NECK PAIN 10/27/2008    Past Medical History:  Diagnosis Date  Anemia    Anxiety    Back pain    Bell's palsy    Contraceptive management 11/30/2015   DDD (degenerative disc disease)    DDD (degenerative disc disease)    Degenerative disc  disease    Fibromyalgia    Fibromyalgia    History of kidney stones    Hypertension    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HCC)    Myositis    Palpitations    PVC (premature ventricular contraction)    Scoliosis    Seizures ([place])    unknown etiolgy; no meds and no seizures for 2 years.   Smoker 11/30/2015   Spondylosis     Family History  Problem Relation Age of Onset   Multiple sclerosis Mother    Heart disease Father    Kidney disease Father    Alcohol abuse Brother    Mental retardation Brother    Schizophrenia Brother    Other Brother        back problems   Colon cancer Maternal Grandmother    Healthy Son    Healthy Son    Past Surgical History:  Procedure Laterality Date   DILATION AND CURETTAGE OF UTERUS  11/18/1996   DILITATION & CURRETTAGE/HYSTROSCOPY WITH NOVASURE ABLATION N/A 01/16/2016   Procedure: HYSTEROSCOPY WITH NOVASURE ABLATION;  Surgeon: Jonnie Kind, MD;  Location: [facility];  Service: Gynecology;  Laterality: N/A;  Uterine Cavity Length 4.0 cm Uterine Cavity Width 3.3cm Power 73  Time 1 minute 17seconds   LAPAROSCOPIC BILATERAL SALPINGECTOMY Bilateral 01/16/2016   Procedure: LAPAROSCOPIC BILATERAL SALPINGECTOMY;  Surgeon: Jonnie Kind, MD;  Location: [facility];  Service: Gynecology;  Laterality: Bilateral;  procedure 1   OOPHORECTOMY  11/18/2004   Left side   SHOULDER ARTHROSCOPY Left 06/04/2021   Left shoulder arthroscopy with biceps tenodesis mini open rotator cuff tear repair   Social History   Social History Narrative   Not on file   Immunization History  Administered Date(s) Administered   Influenza Whole 07/19/2012   Influenza,inj,Quad PF,6+ Mos 09/15/2015, 10/17/2017   Influenza-Unspecified 08/30/2011   Moderna Sars-Covid-2 Vaccination 08/26/2020, 09/23/2020   Tdap 03/01/2011     Objective: Vital Signs: BP 134/86 (BP Location: Right Arm, Patient Position: Sitting, Cuff Size: Normal)   Pulse 67   Ht _0  (1.549 m)   Wt 112 lb (50.8 kg)   BMI 21.16  kg/m    Physical Exam HENT:     Mouth/Throat:     Mouth: Mucous membranes are moist.     Pharynx: Oropharynx is clear.  Eyes:     Conjunctiva/sclera: Conjunctivae normal.  Cardiovascular:     Rate and Rhythm: Normal rate and regular rhythm.  Pulmonary:     Effort: Pulmonary effort is normal.     Breath sounds: Normal breath sounds.  Skin:    General: Skin is warm and dry.     Findings: No rash.  Neurological:     General: No focal deficit present.     Mental Status: She is alert.     Deep Tendon Reflexes: Reflexes normal.  Psychiatric:        Mood and Affect: Mood normal.     Musculoskeletal Exam:  Left shoulder decreased ROM in abduction and external rotation soft end point with pain, right shoulder normal Elbows full ROM no tenderness or swelling Wrists full ROM no tenderness or swelling Fingers full ROM no tenderness or swelling Mild tenderness to pressure over paraspinal muscles near scapulae Normal hip internal and external rotation  ROM, no tenderness to pressure over greater trochanter head, lateral and posterior pain provoked with Pace maneuver, no focal tenderness over SI joints Knees full ROM no tenderness or swelling Ankles full ROM no tenderness or swelling   Investigation: No additional findings.  Imaging: No results found.  Recent Labs: Lab Results  Component Value Date   WBC 7.2 06/29/2021   HGB 12.4 06/29/2021   PLT 379 06/29/2021   NA 137 07/10/2021   K 4.3 07/10/2021   CL 106 07/10/2021   CO2 23 07/10/2021   GLUCOSE 79 07/10/2021   BUN 14 07/10/2021   CREATININE 0.82 07/10/2021   BILITOT 0.4 07/10/2021   ALKPHOS 80 09/01/2015   AST 13 07/10/2021   ALT 13 07/10/2021   PROT 6.8 07/10/2021   ALBUMIN 4.0 09/01/2015   CALCIUM 9.1 07/10/2021   GFRAA 106 10/24/2020   QFTBGOLDPLUS NEGATIVE 07/10/2021    Speciality Comments: No specialty comments available.  Procedures:  No procedures performed Allergies: Shrimp [shellfish allergy] and  Other   Assessment / Plan:     Visit Diagnoses: Rheumatoid factor positive - Plan: Cyclic citrul peptide antibody, IgG  Positive rheumatoid factor and symptoms sounding suggestive for inflammatory arthritis but cannot confirm diagnosis of RA on exam at this time.  Current inflammation may be suppressed by the very recent prednisone treatment for symptoms.  We will check CCP antibody titer today.  Also plan to follow-up in a few weeks for repeat exam and to discuss treatment option.  High risk medication use - Plan: COMPLETE METABOLIC PANEL WITH GFR, QuantiFERON-TB Gold Plus, DG Chest 2 View, Hepatitis B core antibody, IgM, Hepatitis B surface antigen, Hepatitis C antibody  History and lab results appear very consistent for rheumatoid arthritis discussed possible DMARD treatment first-line option for her would be methotrexate.  Checking hepatitis serology QuantiFERON baseline CMP and baseline chest x-ray for a medication monitoring.  Discussed medication briefly provided some information to review.  Chronic obstructive pulmonary disease, unspecified COPD type ([place])  Former 1 pack/day chronic cigarette smoker discontinued since 2017.  Not on chronic corticosteroid treatments for disease.  Obtain baseline chest x-ray as above for medication monitoring.  Orders: Orders Placed This Encounter  Procedures   DG Chest 2 View   COMPLETE METABOLIC PANEL WITH GFR   Cyclic citrul peptide antibody, IgG   QuantiFERON-TB Gold Plus   Hepatitis B core antibody, IgM   Hepatitis B surface antigen   Hepatitis C antibody   Hepatitis B surface antigen   Hepatitis B core antibody, IgM   Hepatitis C antibody    No orders of the defined types were placed in this encounter.    Follow-Up Instructions: Return in about 3 weeks (around 07/31/2021) for New pt ?RA f/u 3wks.   Collier Salina, MD  Note - This record has been created using [employer].  Chart creation errors have been sought, but may not  always  have been located. Such creation errors do not reflect on  the standard of medical care.

## 2021-07-10 ENCOUNTER — Encounter: Payer: Self-pay | Admitting: Internal Medicine

## 2021-07-10 ENCOUNTER — Ambulatory Visit
Admission: RE | Admit: 2021-07-10 | Discharge: 2021-07-10 | Disposition: A | Discharge status: Home / Self Care | Payer: Medicare Other | Source: Ambulatory Visit | Attending: Internal Medicine | Admitting: Internal Medicine

## 2021-07-10 ENCOUNTER — Ambulatory Visit: Payer: Medicare Other | Admitting: Internal Medicine

## 2021-07-10 VITALS — BP 134/86 | HR 67 | Ht 61.0 in | Wt 112.0 lb

## 2021-07-10 DIAGNOSIS — J449 Chronic obstructive pulmonary disease, unspecified: Secondary | ICD-10-CM

## 2021-07-10 DIAGNOSIS — M0579 Rheumatoid arthritis with rheumatoid factor of multiple sites without organ or systems involvement: Secondary | ICD-10-CM | POA: Insufficient documentation

## 2021-07-10 DIAGNOSIS — R768 Other specified abnormal immunological findings in serum: Secondary | ICD-10-CM | POA: Diagnosis not present

## 2021-07-10 DIAGNOSIS — Z79899 Other long term (current) drug therapy: Secondary | ICD-10-CM

## 2021-07-10 NOTE — Patient Instructions (Addendum)
Your antibody test for rheumatoid arthritis is positive. I do not see too much inflammation on exam today but this is not surprising with recent steroid treatment. I am checking another antibody marker for rheumatoid arthritis to help confirm the diagnosis. We should also follow up in a few week to take another look. I am going ahead and checking baseline tests that would be needed for starting RA medication such as methotrexate. It is also recommended to check a baseline chest xray and I have ordered for this, can walk in at [place].  Anticyclic-Citrullinated Peptide Antibody Test Why am I having this test? You may have the anticyclic-citrullinated peptide antibody test done to help: Diagnose rheumatoid arthritis (RA). RA is a long-term (chronic) disease that causes inflammation in the joints. Determine the severity of your RA, including how much worse it is getting (progression). This test may be done if you have unexplained joint inflammation and have previously tested negative for rheumatoid factor. It may also be done if you have been diagnosed with undifferentiated arthritis and your health careprovider suspects rheumatoid arthritis. What is being tested? This test checks your blood for the presence of anticyclic-citrullinated peptide antibodies. Antibodies are cells that are part of the body's disease-fighting (immune) system. These antibodies appear early in the course of RA and are thought tobe directly involved in the progression of the disease. What kind of sample is taken?  A blood sample is required for this test. It is usually collected by insertinga needle into a blood vessel. How are the results reported? Your test results will be reported as either positive or negative. A result is considered negative if there is less than 20 units of the antibody per mL ofblood. What do the results mean? A positive blood test may mean that you have RA. A negative blood  test means that it is less likely that you have RA. However, a negative test does not completely rule out rheumatoid arthritis. Talk with your health care provider about what your results mean. Questions to ask your health care provider Ask your health care provider, or the department that is doing the test: When will my results be ready? How will I get my results? What are my treatment options? What other tests do I need? What are my next steps? Summary The anticyclic-citrullinated peptide antibody blood test may be done to help your health care provider diagnose rheumatoid arthritis (RA). This test checks your blood for the presence of anticyclic-citrullinated peptide antibodies. These antibodies appear early in the course of RA. A positive blood test may mean that you have RA.   Methotrexate Tablets What is this medication? METHOTREXATE (METH oh TREX ate) treats inflammatory conditions such as arthritis and psoriasis. It works by decreasing inflammation, which can reduce pain and prevent long-term injury to the joints and skin. It may also be used to treat some types of cancer. It works by slowing down the growth of cancercells. This medicine may be used for other purposes; ask your health care provider orpharmacist if you have questions. COMMON BRAND NAME(S): Rheumatrex, Trexall What should I tell my care team before I take this medication? They need to know if you have any of these conditions: Fluid in the stomach area or lungs If you often drink alcohol Infection or immune system problems Kidney disease or on hemodialysis Liver disease Low blood counts, like low white cell, platelet, or red cell counts Lung disease Radiation therapy Stomach ulcers Ulcerative colitis An unusual  or allergic reaction to methotrexate, other medications, foods, dyes, or preservatives Pregnant or trying to get pregnant Breast-feeding How should I use this medication? Take this medication by mouth  with a glass of water. Follow the directions on the prescription label. Take your medication at regular intervals. Do not take it more often than directed. Do not stop taking except on your care team'sadvice. Make sure you know why you are taking this medication and how often you should take it. If this medication is used for a condition that is not cancer, like arthritis or psoriasis, it should be taken weekly, NOT daily. Taking thismedication more often than directed can cause serious side effects, even death. Talk to your care team about safe handling and disposal of this medication. Youmay need to take special precautions. Talk to your care team about the use of this medication in children. While thismedication may be prescribed for selected conditions, precautions do apply. Overdosage: If you think you have taken too much of this medicine contact apoison control center or emergency room at once. NOTE: This medicine is only for you. Do not share this medicine with others. What if I miss a dose? If you miss a dose, talk with your care team. Do not take double or extra doses. What may interact with this medication? Do not take this medication with any of the following: Acitretin This medication may also interact with the following: Aspirin and aspirin-like medications including salicylates Azathioprine Certain antibiotics like penicillins, tetracycline, and chloramphenicol Certain medications that treat or prevent blood clots like warfarin, apixaban, dabigatran, and rivaroxaban Certain medications for stomach problems like esomeprazole, omeprazole, pantoprazole Cyclosporine Dapsone Diuretics Gold Hydroxychloroquine Live virus vaccines Medications for infection like acyclovir, adefovir, amphotericin B, bacitracin, cidofovir, foscarnet, ganciclovir, gentamicin, pentamidine, vancomycin Mercaptopurine NSAIDs, medications for pain and inflammation, like ibuprofen or naproxen Other cytotoxic  agents Pamidronate Pemetrexed Penicillamine Phenylbutazone Phenytoin Probenecid Pyrimethamine Retinoids such as isotretinoin and tretinoin Steroid medications like prednisone or cortisone Sulfonamides like sulfasalazine and trimethoprim/sulfamethoxazole Theophylline Zoledronic acid This list may not describe all possible interactions. Give your health care provider a list of all the medicines, herbs, non-prescription drugs, or dietary supplements you use. Also tell them if you smoke, drink alcohol, or use illegaldrugs. Some items may interact with your medicine. What should I watch for while using this medication? Avoid alcoholic drinks. This medication can make you more sensitive to the sun. Keep out of the sun. If you cannot avoid being in the sun, wear protective clothing and use sunscreen.Do not use sun lamps or tanning beds/booths. You may need blood work done while you are taking this medication. Call your care team for advice if you get a fever, chills or sore throat, or other symptoms of a cold or flu. Do not treat yourself. This medication decreases your body's ability to fight infections. Try to avoid being aroundpeople who are sick. This medication may increase your risk to bruise or bleed. Call your care teamif you notice any unusual bleeding. Be careful brushing or flossing your teeth or using a toothpick because you may get an infection or bleed more easily. If you have any dental work done, Primary school teacher you are receiving this medication. Check with your care team if you get an attack of severe diarrhea, nausea and vomiting, or if you sweat a lot. The loss of too much body fluid can make itdangerous for you to take this medication. Talk to your care team about your risk of cancer. You may be  more at risk forcertain types of cancers if you take this medication. Do not become pregnant while taking this medication or for 6 months after stopping it. Women should inform their care  team if they wish to become pregnant or think they might be pregnant. Men should not father a child while taking this medication and for 3 months after stopping it. There is potential for serious harm to an unborn child. Talk to your care team for more information. Do not breast-feed an infant while taking this medication or for 1week after stopping it. This medication may make it more difficult to get pregnant or father a child.Talk to your care team if you are concerned about your fertility. What side effects may I notice from receiving this medication? Side effects that you should report to your care team as soon as possible: Allergic reactions-skin rash, itching, hives, swelling of the face, lips, tongue, or throat Blood clot-pain, swelling, or warmth in the leg, shortness of breath, chest pain Dry cough, shortness of breath or trouble breathing Infection-fever, chills, cough, sore throat, wounds that don't heal, pain or trouble when passing urine, general feeling of discomfort or being unwell Kidney injury-decrease in the amount of urine, swelling of the ankles, hands, or feet Liver injury-right upper belly pain, loss of appetite, nausea, light-colored stool, dark yellow or brown urine, yellowing of the skin or eyes, unusual weakness or fatigue Low red blood cell count-unusual weakness or fatigue, dizziness, headache, trouble breathing Redness, blistering, peeling, or loosening of the skin, including inside the mouth Seizures Unusual bruising or bleeding Side effects that usually do not require medical attention (report to your careteam if they continue or are bothersome): Diarrhea Dizziness Hair loss Nausea Pain, redness, or swelling with sores inside the mouth or throat Vomiting This list may not describe all possible side effects. Call your doctor for medical advice about side effects. You may report side effects to FDA at1-800-FDA-1088. Where should I keep my medication? Keep out of  the reach of children and pets. Store at room temperature between 20 and 25 degrees C (68 and 77 degrees F).Protect from light. Get rid of any unused medication after the expiration date. Talk to your care team about how to dispose of unused medication. Specialdirections may apply.

## 2021-07-13 ENCOUNTER — Encounter: Payer: Self-pay | Admitting: Orthopedic Surgery

## 2021-07-13 NOTE — Progress Notes (Signed)
Post-Op Visit Note   [name]           Date of Birth: [birth year]           MRN: [id number] Visit Date: 07/09/2021 PCP: Susy Frizzle, MD   Assessment & Plan:  Chief Complaint:  Chief Complaint  Patient presents with   Left Shoulder - Follow-up   Visit Diagnoses:  1. Tear of left supraspinatus tendon     Plan: [name] is a 40 year old patient underwent biceps tenodesis and mini open rotator cuff tear repair 06/11/2021.  Current passive range of motion is 30/90/140.  Op note is copied and we will start her physical therapy once every 2 weeks.  She wants to minimize out-of-pocket expense for this treatment encounter.  We will get her set up for therapy in [place].  Overall she is making good progress.  4-week return for final clinical recheck and possible release.  Since she was last seen she was told her rheumatoid factor is positive which may potentially complicate her recovery  Follow-Up Instructions: Return in about 4 weeks (around 08/06/2021).   Orders:  No orders of the defined types were placed in this encounter.  No orders of the defined types were placed in this encounter.   Imaging: No results found.  PMFS History: Patient Active Problem List   Diagnosis Date Noted   Rheumatoid factor positive 07/10/2021   High risk medication use 07/10/2021   Essential hypertension 11/24/2020   Nonrheumatic mitral valve regurgitation 11/24/2020   Sacroiliac joint pain 01/12/2020   Smoker 11/30/2015   Palpitations 04/27/2014   Vasovagal syncope 04/26/2014   Spondylosis 02/24/2014   Hip pain 12/12/2013   Fibromyalgia 02/10/2013   Somatization disorder 02/10/2013   Pseudoseizures ([place]) 02/10/2013   DDD (degenerative disc disease), lumbosacral 02/10/2013   Spells 02/25/2012   History of idiopathic seizure 10/14/2011   SMOKER 05/20/2010   COPD (chronic obstructive pulmonary disease) ([place]) 05/20/2010   SHOULDER PAIN 10/27/2008   NECK PAIN  10/27/2008   Past Medical History:  Diagnosis Date   Anemia    Anxiety    Back pain    Bell's palsy    Contraceptive management 11/30/2015   DDD (degenerative disc disease)    DDD (degenerative disc disease)    Degenerative disc disease    Fibromyalgia    Fibromyalgia    History of kidney stones    Hypertension    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HCC)    Myositis    Palpitations    PVC (premature ventricular contraction)    Scoliosis    Seizures ([place])    unknown etiolgy; no meds and no seizures for 2 years.   Smoker 11/30/2015   Spondylosis     Family History  Problem Relation Age of Onset   Multiple sclerosis Mother    Heart disease Father    Kidney disease Father    Alcohol abuse Brother    Mental retardation Brother    Schizophrenia Brother    Other Brother        back problems   Colon cancer Maternal Grandmother    Healthy Son    Healthy Son     Past Surgical History:  Procedure Laterality Date   DILATION AND CURETTAGE OF UTERUS  11/18/1996   DILITATION & CURRETTAGE/HYSTROSCOPY WITH NOVASURE ABLATION N/A 01/16/2016   Procedure: HYSTEROSCOPY WITH NOVASURE ABLATION;  Surgeon: Jonnie Kind, MD;  Location: [facility];  Service: Gynecology;  Laterality: N/A;  Uterine Cavity Length 4.0 cm Uterine  Cavity Width 3.3cm Power 73  Time 1 minute 17seconds   LAPAROSCOPIC BILATERAL SALPINGECTOMY Bilateral 01/16/2016   Procedure: LAPAROSCOPIC BILATERAL SALPINGECTOMY;  Surgeon: Jonnie Kind, MD;  Location: [facility];  Service: Gynecology;  Laterality: Bilateral;  procedure 1   OOPHORECTOMY  11/18/2004   Left side   SHOULDER ARTHROSCOPY Left 06/04/2021   Left shoulder arthroscopy with biceps tenodesis mini open rotator cuff tear repair   Social History   Occupational History   Not on file  Tobacco Use   Smoking status: Former    Packs/day: 1.00    Years: 20.00    Pack years: 20.00    Types: Cigarettes    Quit date: 09/12/2016    Years since quitting: 4.8   Smokeless tobacco: Never   Vaping Use   Vaping Use: Every day  Substance and Sexual Activity   Alcohol use: No   Drug use: No   Sexual activity: Yes    Birth control/protection: Surgical    Comment: tubal and ablation

## 2021-07-14 LAB — COMPLETE METABOLIC PANEL WITH GFR
AG Ratio: 1.8 (calc) (ref 1.0–2.5)
ALT: 13 U/L (ref 6–29)
AST: 13 U/L (ref 10–30)
Albumin: 4.4 g/dL (ref 3.6–5.1)
Alkaline phosphatase (APISO): 46 U/L (ref 31–125)
BUN: 14 mg/dL (ref 7–25)
CO2: 23 mmol/L (ref 20–32)
Calcium: 9.1 mg/dL (ref 8.6–10.2)
Chloride: 106 mmol/L (ref 98–110)
Creat: 0.82 mg/dL (ref 0.50–0.99)
Globulin: 2.4 g/dL (calc) (ref 1.9–3.7)
Glucose, Bld: 79 mg/dL (ref 65–99)
Potassium: 4.3 mmol/L (ref 3.5–5.3)
Sodium: 137 mmol/L (ref 135–146)
Total Bilirubin: 0.4 mg/dL (ref 0.2–1.2)
Total Protein: 6.8 g/dL (ref 6.1–8.1)
eGFR: 93 mL/min/{1.73_m2} (ref >=60)

## 2021-07-14 LAB — QUANTIFERON-TB GOLD PLUS
Mitogen-NIL: >10 IU/mL
NIL: 0.03 IU/mL
QuantiFERON-TB Gold Plus: NEGATIVE
TB1-NIL: 0.01 IU/mL
TB2-NIL: 0 IU/mL

## 2021-07-14 LAB — HEPATITIS B SURFACE ANTIGEN: Hepatitis B Surface Ag: NONREACTIVE

## 2021-07-14 LAB — HEPATITIS B CORE ANTIBODY, IGM: Hep B C IgM: NONREACTIVE

## 2021-07-14 LAB — CYCLIC CITRUL PEPTIDE ANTIBODY, IGG: Cyclic Citrullin Peptide Ab: <16 UNITS

## 2021-07-14 LAB — HEPATITIS C ANTIBODY
Hepatitis C Ab: NONREACTIVE
SIGNAL TO CUT-OFF: 0.02 (ref <1.00)

## 2021-07-24 DIAGNOSIS — M25512 Pain in left shoulder: Secondary | ICD-10-CM | POA: Diagnosis not present

## 2021-07-24 DIAGNOSIS — Z4789 Encounter for other orthopedic aftercare: Secondary | ICD-10-CM | POA: Diagnosis not present

## 2021-07-24 DIAGNOSIS — M25612 Stiffness of left shoulder, not elsewhere classified: Secondary | ICD-10-CM | POA: Diagnosis not present

## 2021-07-24 DIAGNOSIS — R293 Abnormal posture: Secondary | ICD-10-CM | POA: Diagnosis not present

## 2021-07-26 DIAGNOSIS — R293 Abnormal posture: Secondary | ICD-10-CM | POA: Diagnosis not present

## 2021-07-26 DIAGNOSIS — M25512 Pain in left shoulder: Secondary | ICD-10-CM | POA: Diagnosis not present

## 2021-07-26 DIAGNOSIS — M25612 Stiffness of left shoulder, not elsewhere classified: Secondary | ICD-10-CM | POA: Diagnosis not present

## 2021-07-26 DIAGNOSIS — Z4789 Encounter for other orthopedic aftercare: Secondary | ICD-10-CM | POA: Diagnosis not present

## 2021-07-31 NOTE — Progress Notes (Signed)
Office Visit Note  [name]             Date of Birth: 12/19/1980           MRN: [id number]             PCP: Susy Frizzle, MD Referring: Susy Frizzle, MD Visit Date: 08/01/2021   Subjective:  Follow-up (Patient complains of continued bilateral shoulder pain (recent L shoulder surgery- 06/04/2021), bilateral elbow pain, bilateral hip pain, and bilateral medial knee pain.))   History of Present Illness: Theresa Washington is a 40 y.o. female here for follow up for joint pain of multiple sites with positive RF.  She continues having joint pain in multiple sites bilaterally especially shoulders elbows hips and knees.  She describes the pain most severe at night her knee pain is very bad this partially decreases during the day while up and moving she states goes down to about a 6 out of 10 intensity most days but overall has a lot of pain.  She feels there is some puffiness and tenderness around her elbows on both sides but no other specific swelling.  Since her last episode that was treated with a course of steroids by Dr. Dennard Schaumann she has not had any new joint redness or warmth to the touch.  She also describes continued overnight symptoms waking with drenching sweats she has not noticed any lymphadenopathy or any unintentional weight loss associated with these. She also suffered a sting to her right forearm from a saddleback caterpillar that fell onto her arm while working in the yard.  Previous HPI 07/10/21 Theresa Washington is a 40 y.o. female here for chronic joint of multiple sites with positive rheumatoid factor. She has history of left shoulder arthroscopy for rotator cuff tear and let hip arthroscopy for labral tear. Joint pains are mostly chronic worst in the shoulders, elbows, hips, and knees bilaterally. These were evaluated years ago with negative serology and attributed to her injury changes and fibromyalgia syndrome. She previously developed high  increases in inflammatory markers despite negative antibody tests. Pain has gotten worse some in the shoulders and hips treated with orthopedics but now also more pain and swelling in elbows. She is stiff for more than an hour in mornings but also has worsening pain with heavier use. She gets the most relief from prednisone and has taken intermittent short tapering doses with good relief but when recurrence afterwards. Individuals flares or episodes lasting about 7-10 days typically. Most recent treatment finished 2 days ago.   Labs reviewed 06/2021 RF 50 ANA neg ESR 11 CRP neg CBC wnl   Review of Systems  Constitutional:  Positive for fatigue.  HENT:  Positive for mouth sores. Negative for mouth dryness and nose dryness.   Eyes:  Positive for visual disturbance and dryness. Negative for pain and itching.  Respiratory:  Positive for cough. Negative for hemoptysis, shortness of breath and difficulty breathing.   Cardiovascular:  Positive for chest pain. Negative for palpitations and swelling in legs/feet.  Gastrointestinal:  Negative for abdominal pain, blood in stool, constipation and diarrhea.  Endocrine: Negative for increased urination.  Genitourinary:  Negative for painful urination.  Musculoskeletal:  Positive for joint pain, joint pain, joint swelling, myalgias, muscle weakness, morning stiffness, muscle tenderness and myalgias.  Skin:  Negative for color change, rash and redness.  Allergic/Immunologic: Negative for susceptible to infections.  Neurological:  Negative for dizziness, numbness, headaches, memory loss and weakness.  Hematological:  Negative for swollen glands.  Psychiatric/Behavioral:  Positive for sleep disturbance. Negative for confusion.    PMFS History:  Patient Active Problem List   Diagnosis Date Noted   Bilateral knee pain 08/01/2021   Rheumatoid factor positive 07/10/2021   High risk medication use 07/10/2021   Essential hypertension 11/24/2020    Nonrheumatic mitral valve regurgitation 11/24/2020   Sacroiliac joint pain 01/12/2020   Smoker 11/30/2015   Palpitations 04/27/2014   Vasovagal syncope 04/26/2014   Spondylosis 02/24/2014   Hip pain 12/12/2013   Fibromyalgia 02/10/2013   Somatization disorder 02/10/2013   Pseudoseizures ([place]) 02/10/2013   DDD (degenerative disc disease), lumbosacral 02/10/2013   Spells 02/25/2012   History of idiopathic seizure 10/14/2011   SMOKER 05/20/2010   COPD (chronic obstructive pulmonary disease) ([place]) 05/20/2010   SHOULDER PAIN 10/27/2008   NECK PAIN 10/27/2008    Past Medical History:  Diagnosis Date   Anemia    Anxiety    Back pain    Bell's palsy    Contraceptive management 11/30/2015   DDD (degenerative disc disease)    DDD (degenerative disc disease)    Degenerative disc disease    Fibromyalgia    Fibromyalgia    History of kidney stones    Hypertension    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HCC)    Myositis    Palpitations    PVC (premature ventricular contraction)    Scoliosis    Seizures ([place])    unknown etiolgy; no meds and no seizures for 2 years.   Smoker 11/30/2015   Spondylosis     Family History  Problem Relation Age of Onset   Multiple sclerosis Mother    Heart disease Father    Kidney disease Father    Alcohol abuse Brother    Mental retardation Brother    Schizophrenia Brother    Other Brother        back problems   Colon cancer Maternal Grandmother    Healthy Son    Healthy Son    Past Surgical History:  Procedure Laterality Date   DILATION AND CURETTAGE OF UTERUS  11/18/1996   DILITATION & CURRETTAGE/HYSTROSCOPY WITH NOVASURE ABLATION N/A 01/16/2016   Procedure: HYSTEROSCOPY WITH NOVASURE ABLATION;  Surgeon: Jonnie Kind, MD;  Location: [facility];  Service: Gynecology;  Laterality: N/A;  Uterine Cavity Length 4.0 cm Uterine Cavity Width 3.3cm Power 73  Time 1 minute 17seconds   LAPAROSCOPIC BILATERAL SALPINGECTOMY Bilateral 01/16/2016   Procedure: LAPAROSCOPIC  BILATERAL SALPINGECTOMY;  Surgeon: Jonnie Kind, MD;  Location: [facility];  Service: Gynecology;  Laterality: Bilateral;  procedure 1   OOPHORECTOMY  11/18/2004   Left side   SHOULDER ARTHROSCOPY Left 06/04/2021   Left shoulder arthroscopy with biceps tenodesis mini open rotator cuff tear repair   Social History   Social History Narrative   Not on file   Immunization History  Administered Date(s) Administered   Influenza Whole 07/19/2012   Influenza,inj,Quad PF,6+ Mos 09/15/2015, 10/17/2017   Influenza-Unspecified 08/30/2011   Moderna Sars-Covid-2 Vaccination 08/26/2020, 09/23/2020   Tdap 03/01/2011     Objective: Vital Signs: BP 135/88 (BP Location: Left Arm, Patient Position: Sitting, Cuff Size: Normal)   Pulse 86   Ht 5' (1.524 m)   Wt 111 lb 3.2 oz (50.4 kg)   BMI 21.72 kg/m    Physical Exam Eyes:     Conjunctiva/sclera: Conjunctivae normal.  Skin:    General: Skin is warm and dry.     Findings: No rash.     Comments:  Livedo reticularis visible on all extremities Patches of raised erythematous wheals on right forearm extensor surface  Neurological:     Mental Status: She is alert.  Psychiatric:        Mood and Affect: Mood normal.     Musculoskeletal Exam:  Elbows full ROM tenderness to palpation worst at medial and lateral epicondyles with no swelling Wrists full ROM no tenderness or swelling Fingers full ROM no tenderness or swelling Lateral hip tenderness to palpation bilaterally Knees full ROM, tenderness with range of motion and with palpation over medial joint line no palpable effusions no fluid collection visible on limited ultrasound inspection Ankles full ROM no tenderness or swelling    Investigation: No additional findings.  Imaging: No results found.  Recent Labs: Lab Results  Component Value Date   WBC 7.2 06/29/2021   HGB 12.4 06/29/2021   PLT 379 06/29/2021   NA 137 07/10/2021   K 4.3 07/10/2021   CL 106 07/10/2021   CO2 23  07/10/2021   GLUCOSE 79 07/10/2021   BUN 14 07/10/2021   CREATININE 0.82 07/10/2021   BILITOT 0.4 07/10/2021   ALKPHOS 80 09/01/2015   AST 13 07/10/2021   ALT 13 07/10/2021   PROT 6.8 07/10/2021   ALBUMIN 4.0 09/01/2015   CALCIUM 9.1 07/10/2021   GFRAA 106 10/24/2020   QFTBGOLDPLUS NEGATIVE 07/10/2021    Speciality Comments: No specialty comments available.  Procedures:  No procedures performed Allergies: Shrimp [shellfish allergy] and Other   Assessment / Plan:     Visit Diagnoses: Rheumatoid factor positive  Positive rheumatoid factor but normal inflammatory markers and no objective synovitis on repeat exam again today including limited ultrasound inspection.  At this time cannot confirm any diagnosis of inflammatory arthritis that her described symptoms could be consistent with this.  Recommend she continue her current treatments with some degenerative joint disease at multiple sites and rotator cuff tendinopathy with recent repair we could plan to follow-up in about 3 months to reassess any new evidence of disease activity or she can return as needed if having a new severe exacerbation that would be beneficial to see in person.  Fibromyalgia  Joint pain in multiple areas with associated widespread symptoms unfortunately she has had little to no benefit with physical therapy or with SNRI type medications.  Hip pain Chronic pain of both knees  Joint pain without any swelling or obvious inflammation again today.  Improvement with rest relatively normal appearance and range of motion suggests against severe degenerative arthritis as the cause.  We will plan to follow-up as above.  Orders: No orders of the defined types were placed in this encounter.  No orders of the defined types were placed in this encounter.    Follow-Up Instructions: Return in about 3 months (around 10/31/2021), or if symptoms worsen or fail to improve, for ?RA or DJD/FMS f/u 64mo.   CCollier Salina MD  Note - This record has been created using [employer]  Chart creation errors have been sought, but may not always  have been located. Such creation errors do not reflect on  the standard of medical care.

## 2021-08-01 ENCOUNTER — Encounter: Payer: Self-pay | Admitting: Radiology

## 2021-08-01 ENCOUNTER — Encounter: Payer: Self-pay | Admitting: Internal Medicine

## 2021-08-01 ENCOUNTER — Other Ambulatory Visit: Payer: Self-pay

## 2021-08-01 ENCOUNTER — Ambulatory Visit (INDEPENDENT_AMBULATORY_CARE_PROVIDER_SITE_OTHER): Payer: Medicare Other | Admitting: Internal Medicine

## 2021-08-01 VITALS — BP 135/88 | HR 86 | Ht 60.0 in | Wt 111.2 lb

## 2021-08-01 DIAGNOSIS — M25562 Pain in left knee: Secondary | ICD-10-CM

## 2021-08-01 DIAGNOSIS — R768 Other specified abnormal immunological findings in serum: Secondary | ICD-10-CM

## 2021-08-01 DIAGNOSIS — M797 Fibromyalgia: Secondary | ICD-10-CM | POA: Diagnosis not present

## 2021-08-01 DIAGNOSIS — M25559 Pain in unspecified hip: Secondary | ICD-10-CM

## 2021-08-01 DIAGNOSIS — M25561 Pain in right knee: Secondary | ICD-10-CM | POA: Insufficient documentation

## 2021-08-01 DIAGNOSIS — G8929 Other chronic pain: Secondary | ICD-10-CM | POA: Diagnosis not present

## 2021-08-03 ENCOUNTER — Telehealth: Payer: Self-pay

## 2021-08-03 DIAGNOSIS — H33101 Unspecified retinoschisis, right eye: Secondary | ICD-10-CM | POA: Diagnosis not present

## 2021-08-03 DIAGNOSIS — H04123 Dry eye syndrome of bilateral lacrimal glands: Secondary | ICD-10-CM | POA: Diagnosis not present

## 2021-08-03 DIAGNOSIS — H5319 Other subjective visual disturbances: Secondary | ICD-10-CM | POA: Diagnosis not present

## 2021-08-03 NOTE — Chronic Care Management (AMB) (Signed)
Left message for patient to call back and schedule Medicare Annual Wellness Visit (AWV) to do by phone or video     Last AWV 11/18/2016  Please schedule with [facility]-Nurse Health Advisor.       60 Minutes appointment    Any questions, please call me at  Noreene Larsson, [address], [place], [place] [address] Direct Dial: [phone] [email or web address] Website: [email or web address]

## 2021-08-05 ENCOUNTER — Ambulatory Visit (INDEPENDENT_AMBULATORY_CARE_PROVIDER_SITE_OTHER): Payer: Medicare Other | Admitting: Family Medicine

## 2021-08-05 DIAGNOSIS — Z0000 Encounter for general adult medical examination without abnormal findings: Secondary | ICD-10-CM

## 2021-08-05 NOTE — Progress Notes (Signed)
Subjective:  I connected with  Theresa Washington on 08/05/21 by telemedicine application and verified that I am speaking with the correct person using two identifiers.   I discussed the limitations of evaluation and management by telemedicine. The patient expressed understanding and agreed to proceed. Location of patient: Home  Location of provider:Office  Persons participating in visit: Theresa Washington and [place] B Upham is a 40 y.o. female who presents for an Initial Medicare Annual Wellness Visit.  Review of Systems    Defer to PCP       Objective:    Today's Vitals   08/05/21 1111  PainSc: 8    There is no height or weight on file to calculate BMI.  Advanced Directives 08/05/2021 04/02/2017 01/12/2016 01/24/2015 04/26/2014  Does Patient Have a Medical Advance Directive? No No No No Patient does not have advance directive;Patient would not like information  Would patient like information on creating a medical advance directive? Yes (ED - Information included in AVS) No - Patient declined No - patient declined information No - patient declined information -  Pre-existing out of facility DNR order (yellow form or pink MOST form) - - - - No    Current Medications (verified) Outpatient Encounter Medications as of 08/05/2021  Medication Sig   acetaminophen (TYLENOL) 500 MG tablet Take 500 mg by mouth every 6 (six) hours as needed.   celecoxib (CELEBREX) 100 MG capsule TAKE 1 CAPSULE(100 MG) BY MOUTH TWICE DAILY   diclofenac sodium (VOLTAREN) 1 % GEL Apply topically as needed.   EPINEPHrine (EPIPEN JR) 0.15 MG/0.3ML injection Inject 0.15 mg into the muscle as needed for anaphylaxis.   glucosamine-chondroitin 500-400 MG tablet Take 1 tablet by mouth 3 (three) times daily.   methocarbamol (ROBAXIN) 500 MG tablet Take 1 tablet (500 mg total) by mouth every 8 (eight) hours as needed for muscle spasms.   methylcellulose (ARTIFICIAL TEARS) 1 % ophthalmic  solution Place 1 drop into both eyes daily as needed. Dry Eyes   Omega-3 Fatty Acids (FISH OIL PO) Take by mouth.   RESVERATROL PO Take by mouth.   rizatriptan (MAXALT) 10 MG tablet Take 10 mg by mouth as needed for migraine. May repeat in 2 hours if needed   topiramate (TOPAMAX) 25 MG tablet TAKE 2 TABLETS(50 MG) BY MOUTH TWICE DAILY   oxyCODONE (OXY IR/ROXICODONE) 5 MG immediate release tablet Take 1 tablet (5 mg total) by mouth every 4 (four) hours as needed for severe pain. (Patient not taking: No sig reported)   oxycodone (OXY-IR) 5 MG capsule Take 1 capsule (5 mg total) by mouth every 4 (four) hours as needed. (Patient not taking: No sig reported)   predniSONE (DELTASONE) 10 MG tablet Take 1 tablet (10 mg total) by mouth daily with breakfast. Take '40mg'$  on days 1-2. Take '30mg'$  on days 3-4. Take '20mg'$  on days 5-6. Take '10mg'$  on days 7-8. Take '5mg'$  on days 9-10, then stop.   No facility-administered encounter medications on file as of 08/05/2021.    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   Smoker 11/30/2015   Spondylosis    Past Surgical History:  Procedure Laterality Date   DILATION AND CURETTAGE OF UTERUS  11/18/1996   DILITATION & CURRETTAGE/HYSTROSCOPY WITH NOVASURE ABLATION N/A 01/16/2016   Procedure: HYSTEROSCOPY WITH NOVASURE ABLATION;  Surgeon: Jonnie Kind, MD;  Location: [facility];  Service: Gynecology;  Laterality: N/A;  Uterine Cavity  Length 4.0 cm Uterine Cavity Width 3.3cm Power 73  Time 1 minute 17seconds   LAPAROSCOPIC BILATERAL SALPINGECTOMY Bilateral 01/16/2016   Procedure: LAPAROSCOPIC BILATERAL SALPINGECTOMY;  Surgeon: Jonnie Kind, MD;  Location: [facility];  Service: Gynecology;  Laterality: Bilateral;  procedure 1   OOPHORECTOMY  11/18/2004   Left side   SHOULDER ARTHROSCOPY Left 06/04/2021   Left shoulder arthroscopy with biceps tenodesis mini open rotator cuff tear repair   Family History  Problem Relation Age of Onset   Multiple sclerosis Mother    Heart disease Father    Kidney disease Father    Alcohol abuse Brother    Mental retardation Brother    Schizophrenia Brother    Other Brother        back problems   ALS Brother    Colon cancer Maternal Grandmother    Healthy Son    Healthy Son    Social History   Socioeconomic History   Marital status: Married    Spouse name: Not on file   Number of children: Not on file   Years of education: Not on file   Highest education level: Not on file  Occupational History   Not on file  Tobacco Use   Smoking status: Former    Packs/day: 1.00    Years: 20.00    Pack years: 20.00    Types: Cigarettes    Quit date: 09/12/2016    Years since quitting: 4.8   Smokeless tobacco: Never  Vaping Use   Vaping Use: Every day  Substance and Sexual Activity   Alcohol use: Yes    Comment: socially   Drug use: Never   Sexual activity: Yes    Birth control/protection: Surgical    Comment: tubal and ablation  Other Topics Concern   Not on file  Social History Narrative   Not on file   Social Determinants of Health   Financial Resource Strain: Not on file  Food Insecurity: Not on file  Transportation Needs: Not on file  Physical Activity: Not on file  Stress: Not on file  Social Connections: Not on file    Tobacco Counseling Counseling given: Not Answered   Clinical Intake:  Pre-visit preparation completed: Yes  Pain : 0-10 Pain Score: 8   Pain Type: Chronic pain Pain Location: Shoulder (bilateral hands and hips) Pain Orientation: Right, Left Pain Descriptors / Indicators: Tender, Tightness, Burning, Sore Pain Onset: More than a month ago Pain Frequency: Constant Pain Relieving Factors: Epsom salt and warms bathes, rest Effect of Pain on Daily Activities: Driving,shopping and cleaning  Pain Relieving Factors: Epsom salt and warms bathes, rest  Diabetes: No  How often do you need to have someone help you when you read instructions, pamphlets, or other written materials from your doctor or pharmacy?: 1 - Never What is the last grade level you completed in school?:  12th  Diabetic?No  Interpreter Needed?: No  Information entered by :: [place] of Daily Living In your present state of health, do you have any difficulty performing the following activities: 08/05/2021  Hearing? N  Vision? N  Comment wear glasses  Difficulty concentrating or making decisions? N  Walking or climbing stairs? Y  Comment with hip pain only  Dressing or bathing? N  Doing errands, shopping? N  Some recent data might be hidden    Patient Care Team: Susy Frizzle, MD as PCP - General (Family Medicine) Werner Lean, MD as PCP - Cardiology (Cardiology) Fay Records, MD as Consulting Physician (Cardiology)  Indicate any recent Medical Services you may have received from other than [facility] providers in the past year (date may be approximate).     Assessment:   This is a routine wellness examination for Theresa Washington.  Hearing/Vision screen Hearing Screening - Comments:: No concerns Vision Screening - Comments:: Wear eye glasses  Dietary issues and exercise activities discussed:     Goals Addressed   None   Depression Screen PHQ 2/9 Scores 08/05/2021 09/10/2019 07/29/2019 01/09/2018 11/14/2017 10/17/2017 02/18/2017  PHQ - 2 Score 0 0 0 0 0 0 0  PHQ- 9 Score - - - - - 6 0    Fall Risk Fall Risk   08/05/2021 09/10/2019 07/29/2019 10/17/2017 02/18/2017  Falls in the past year? 0 0 0 No No  Number falls in past yr: 0 0 - - -  Injury with Fall? 0 - - - -  Risk for fall due to : No Fall Risks - - - -  Follow up Education provided;Falls prevention discussed;Falls evaluation completed Falls evaluation completed - - -    FALL RISK PREVENTION PERTAINING TO THE HOME:  Any stairs in or around the home? No  If so, are there any without handrails? No  Home free of loose throw rugs in walkways, pet beds, electrical cords, etc? Yes  Adequate lighting in your home to reduce risk of falls? Yes   ASSISTIVE DEVICES UTILIZED TO PREVENT FALLS:  Life alert? No  Use of a cane, walker or w/c? No  Grab bars in the bathroom? No  Shower chair or bench in shower? No  Elevated toilet seat or a handicapped toilet? No   TIMED UP AND GO:  Was the test performed? No .  Length of time to ambulate 10 feet: N/A sec.   Gait steady and fast without use of assistive device  Cognitive Function:     6CIT Screen 08/05/2021  What Year? 0 points  What month? 0 points  What time? 0 points  Count back from 20 0 points  Months in reverse 0 points  Repeat phrase 8 points  Total Score 8    Immunizations Immunization History  Administered Date(s) Administered   Influenza Whole 07/19/2012   Influenza,inj,Quad PF,6+ Mos 09/15/2015, 10/17/2017   Influenza-Unspecified 08/30/2011   Moderna Sars-Covid-2 Vaccination 08/26/2020, 09/23/2020   Tdap 03/01/2011    TDAP status: Due, Education has been provided regarding the importance of this vaccine. Advised may receive this vaccine at local pharmacy or Health Dept. Aware to provide a copy of the vaccination record if obtained from local pharmacy or Health Dept. Verbalized acceptance and understanding.  Flu Vaccine status: Due, Education has been provided regarding the importance of this vaccine. Advised may receive this vaccine at local pharmacy or Health Dept. Aware  to provide a copy of the vaccination record if obtained  from local pharmacy or Health Dept. Verbalized acceptance and understanding.  Pneumococcal vaccine status: Declined,  Education has been provided regarding the importance of this vaccine but patient still declined. Advised may receive this vaccine at local pharmacy or Health Dept. Aware to provide a copy of the vaccination record if obtained from local pharmacy or Health Dept. Verbalized acceptance and understanding.   Covid-19 vaccine status: Completed vaccines  Qualifies for Shingles Vaccine? No   Zostavax completed No   Shingrix Completed?: No.    Education has been provided regarding the importance of this vaccine. Patient has been advised to call insurance company to determine out of pocket expense if they have not yet received this vaccine. Advised may also receive vaccine at local pharmacy or Health Dept. Verbalized acceptance and understanding.  Screening Tests Health Maintenance  Topic Date Due   TETANUS/TDAP  09/17/2021 (Originally 02/28/2021)   COVID-19 Vaccine (3 - Moderna risk series) 09/17/2021 (Originally 10/21/2020)   HIV Screening  09/17/2021 (Originally 02/10/1996)   INFLUENZA VACCINE  02/15/2022 (Originally 06/18/2021)   PAP SMEAR-Modifier  07/28/2022   Hepatitis C Screening  Completed   HPV VACCINES  Aged Out    Health Maintenance  There are no preventive care reminders to display for this patient.   Colorectal cancer screening: No longer required.  Not required due to age.  Mammogram status: Completed 02/13/2021. Repeat every year  Bone Density status: Ordered No required due to age. Pt provided with contact info and advised to call to schedule appt.  Lung Cancer Screening: (Low Dose CT Chest recommended if Age 61-80 years, 30 pack-year currently smoking OR have quit w/in 15years.) does not qualify.   Lung Cancer Screening Referral: N/A  Additional Screening:  Hepatitis C Screening: does qualify; Completed  07/10/2021  Vision Screening: Recommended annual ophthalmology exams for early detection of glaucoma and other disorders of the eye. Is the patient up to date with their annual eye exam?  Yes  08/03/21 Who is the provider or what is the name of the office in which the patient attends annual eye exams? [facility], Dr.Pecen If pt is not established with a provider, would they like to be referred to a provider to establish care? No .   Dental Screening: Recommended annual dental exams for proper oral hygiene.  Community Resource Referral / Chronic Care Management: CRR required this visit?  No   CCM required this visit?  No      Plan:     I have personally reviewed and noted the following in the patient's chart:   Medical and social history Use of alcohol, tobacco or illicit drugs  Current medications and supplements including opioid prescriptions. Patient is not currently taking opioid prescriptions. Functional ability and status Nutritional status Physical activity Advanced directives List of other physicians Hospitalizations, surgeries, and ER visits in previous 12 months Vitals Screenings to include cognitive, depression, and falls Referrals and appointments  In addition, I have reviewed and discussed with patient certain preventive protocols, quality metrics, and best practice recommendations. A written personalized care plan for preventive services as well as general preventive health recommendations were provided to patient.     [place], [place]   08/05/2021   Nurse Notes: Non-Face to Face  33 minutes visit.  Ms. Pavelka , Thank you for taking time to come for your Medicare Wellness Visit. I appreciate your ongoing commitment to your health goals. Please review the following plan we discussed and let me know if I can assist  you in the future.   These are the goals we discussed:  Goals   None     This is a list of the screening recommended for you and due  dates:  Health Maintenance  Topic Date Due   Tetanus Vaccine  09/17/2021*   COVID-19 Vaccine (3 - Moderna risk series) 09/17/2021*   HIV Screening  09/17/2021*   Flu Shot  02/15/2022*   Pap Smear  07/28/2022   Hepatitis C Screening: USPSTF Recommendation to screen - Ages 18-79 yo.  Completed   HPV Vaccine  Aged Out  *Topic was postponed. The date shown is not the original due date.

## 2021-08-05 NOTE — Patient Instructions (Addendum)
Health Maintenance, Female Adopting a healthy lifestyle and getting preventive care are important in promoting health and wellness. Ask your health care provider about: The right schedule for you to have regular tests and exams. Things you can do on your own to prevent diseases and keep yourself healthy. What should I know about diet, weight, and exercise? Eat a healthy diet  Eat a diet that includes plenty of vegetables, fruits, low-fat dairy products, and lean protein. Do not eat a lot of foods that are high in solid fats, added sugars, or sodium. Maintain a healthy weight Body mass index (BMI) is used to identify weight problems. It estimates body fat based on height and weight. Your health care provider can help determine your BMI and help you achieve or maintain a healthy weight. Get regular exercise Get regular exercise. This is one of the most important things you can do for your health. Most adults should: Exercise for at least 150 minutes each week. The exercise should increase your heart rate and make you sweat (moderate-intensity exercise). Do strengthening exercises at least twice a week. This is in addition to the moderate-intensity exercise. Spend less time sitting. Even light physical activity can be beneficial. Watch cholesterol and blood lipids Have your blood tested for lipids and cholesterol at 40 years of age, then have this test every 5 years. Have your cholesterol levels checked more often if: Your lipid or cholesterol levels are high. You are older than 40 years of age. You are at high risk for heart disease. What should I know about cancer screening? Depending on your health history and family history, you may need to have cancer screening at various ages. This may include screening for: Breast cancer. Cervical cancer. Colorectal cancer. Skin cancer. Lung cancer. What should I know about heart disease, diabetes, and high blood pressure? Blood pressure and heart  disease High blood pressure causes heart disease and increases the risk of stroke. This is more likely to develop in people who have high blood pressure readings, are of African descent, or are overweight. Have your blood pressure checked: Every 3-5 years if you are 18-39 years of age. Every year if you are 40 years old or older. Diabetes Have regular diabetes screenings. This checks your fasting blood sugar level. Have the screening done: Once every three years after age 40 if you are at a normal weight and have a low risk for diabetes. More often and at a younger age if you are overweight or have a high risk for diabetes. What should I know about preventing infection? Hepatitis B If you have a higher risk for hepatitis B, you should be screened for this virus. Talk with your health care provider to find out if you are at risk for hepatitis B infection. Hepatitis C Testing is recommended for: Everyone born from 1945 through 1965. Anyone with known risk factors for hepatitis C. Sexually transmitted infections (STIs) Get screened for STIs, including gonorrhea and chlamydia, if: You are sexually active and are younger than 40 years of age. You are older than 40 years of age and your health care provider tells you that you are at risk for this type of infection. Your sexual activity has changed since you were last screened, and you are at increased risk for chlamydia or gonorrhea. Ask your health care provider if you are at risk. Ask your health care provider about whether you are at high risk for HIV. Your health care provider may recommend a prescription medicine   to help prevent HIV infection. If you choose to take medicine to prevent HIV, you should first get tested for HIV. You should then be tested every 3 months for as long as you are taking the medicine. Pregnancy If you are about to stop having your period (premenopausal) and you may become pregnant, seek counseling before you get  pregnant. Take 400 to 800 micrograms (mcg) of folic acid every day if you become pregnant. Ask for birth control (contraception) if you want to prevent pregnancy. Osteoporosis and menopause Osteoporosis is a disease in which the bones lose minerals and strength with aging. This can result in bone fractures. If you are 66 years old or older, or if you are at risk for osteoporosis and fractures, ask your health care provider if you should: Be screened for bone loss. Take a calcium or vitamin D supplement to lower your risk of fractures. Be given hormone replacement therapy (HRT) to treat symptoms of menopause. Follow these instructions at home: Lifestyle Do not use any products that contain nicotine or tobacco, such as cigarettes, e-cigarettes, and chewing tobacco. If you need help quitting, ask your health care provider. Do not use street drugs. Do not share needles. Ask your health care provider for help if you need support or information about quitting drugs. Alcohol use Do not drink alcohol if: Your health care provider tells you not to drink. You are pregnant, may be pregnant, or are planning to become pregnant. If you drink alcohol: Limit how much you use to 0-1 drink a day. Limit intake if you are breastfeeding. Be aware of how much alcohol is in your drink. In the [place] one drink equals one 12 oz bottle of beer (355 mL), one 5 oz glass of wine (148 mL), or one 1 oz glass of hard liquor (44 mL). General instructions Schedule regular health, dental, and eye exams. Stay current with your vaccines. Tell your health care provider if: You often feel depressed. You have ever been abused or do not feel safe at home. Summary Adopting a healthy lifestyle and getting preventive care are important in promoting health and wellness. Follow your health care provider's instructions about healthy diet, exercising, and getting tested or screened for diseases. Follow your health care provider's  instructions on monitoring your cholesterol and blood pressure. This information is not intended to replace advice given to you by your health care provider. Make sure you discuss any questions you have with your health care provider. Document Revised: 01/12/2021 Document Reviewed: 10/28/2018 Elsevier Patient Education  2022 [place] Directive Advance directives are legal documents that allow you to make decisions about your health care and medical treatment in case you become unable to communicate for yourself. Advance directives let your wishes be known to family, friends, and health care providers. Discussing and writing advance directives should happen over time rather than all at once. Advance directives can be changed and updated at any time. There are different types of advance directives, such as: Medical power of attorney. Living will. Do not resuscitate (DNR) order or do not attempt resuscitation (DNAR) order. Health care proxy and medical power of attorney A health care proxy is also called a health care agent. This person is appointed to make medical decisions for you when you are unable to make decisions for yourself. Generally, people ask a trusted friend or family member to act as their proxy and represent their preferences. Make sure you have an agreement with your trusted person to  act as your proxy. A proxy may have to make a medical decision on your behalf if your wishes are not known. A medical power of attorney, also called a durable power of attorney for health care, is a legal document that names your health care proxy. Depending on the laws in your state, the document may need to be: Signed. Notarized. Dated. Copied. Witnessed. Incorporated into your medical record. You may also want to appoint a trusted person to manage your money in the event you are unable to do so. This is called a durable power of attorney for finances. It is a separate legal document  from the durable power of attorney for health care. You may choose your health care proxy or someone different to act as your agent in money matters. If you do not appoint a proxy, or there is a concern that the proxy is not acting in your best interest, a court may appoint a guardian to act on your behalf. Living will A living will is a set of instructions that state your wishes about medical care when you cannot express them yourself. Health care providers should keep a copy of your living will in your medical record. You may want to give a copy to family members or friends. To alert caregivers in case of an emergency, you can place a card in your wallet to let them know that you have a living will and where they can find it. A living will is used if you become: Terminally ill. Disabled. Unable to communicate or make decisions. The following decisions should be included in your living will: To use or not to use life support equipment, such as dialysis machines and breathing machines (ventilators). Whether you want a DNR or DNAR order. This tells health care providers not to use cardiopulmonary resuscitation (CPR) if breathing or heartbeat stops. To use or not to use tube feeding. To be given or not to be given food and fluids. Whether you want comfort (palliative) care when the goal becomes comfort rather than a cure. Whether you want to donate your organs and tissues. A living will does not give instructions for distributing your money and property if you should pass away. DNR or DNAR A DNR or DNAR order is a request not to have CPR in the event that your heart stops beating or you stop breathing. If a DNR or DNAR order has not been made and shared, a health care provider will try to help any patient whose heart has stopped or who has stopped breathing. If you plan to have surgery, talk with your health care provider about how your DNR or DNAR order will be followed if problems occur. What if I  do not have an advance directive? Some states assign family decision makers to act on your behalf if you do not have an advance directive. Each state has its own laws about advance directives. You may want to check with your health care provider, attorney, or state representative about the laws in your state. Summary Advance directives are legal documents that allow you to make decisions about your health care and medical treatment in case you become unable to communicate for yourself. The process of discussing and writing advance directives should happen over time. You can change and update advance directives at any time. Advance directives may include a medical power of attorney, a living will, and a DNR or DNAR order. This information is not intended to replace advice given to you  by your health care provider. Make sure you discuss any questions you have with your health care provider. Document Revised: 08/08/2020 Document Reviewed: 08/08/2020 Elsevier Patient Education  2022 [employer].

## 2021-08-06 ENCOUNTER — Other Ambulatory Visit: Payer: Self-pay | Admitting: Family Medicine

## 2021-08-06 ENCOUNTER — Other Ambulatory Visit: Payer: Self-pay

## 2021-08-06 ENCOUNTER — Encounter: Payer: Self-pay | Admitting: Orthopedic Surgery

## 2021-08-06 ENCOUNTER — Ambulatory Visit (INDEPENDENT_AMBULATORY_CARE_PROVIDER_SITE_OTHER): Payer: Medicare Other | Admitting: Orthopedic Surgery

## 2021-08-06 ENCOUNTER — Encounter: Payer: Self-pay | Admitting: Family Medicine

## 2021-08-06 DIAGNOSIS — M75102 Unspecified rotator cuff tear or rupture of left shoulder, not specified as traumatic: Secondary | ICD-10-CM

## 2021-08-06 MED ORDER — RIZATRIPTAN BENZOATE 10 MG PO TABS: 10.0000 mg | ORAL_TABLET | ORAL | 3 refills | Status: DC | PRN

## 2021-08-06 NOTE — Progress Notes (Signed)
Post-Op Visit Note   [name]           Date of Birth: 09/13/1981           MRN: [id number] Visit Date: 08/06/2021 PCP: Susy Frizzle, MD   Assessment & Plan:  Chief Complaint:  Chief Complaint  Patient presents with   Left Shoulder - Routine Post Op    biceps tenodesis and mini open rotator cuff tear repair 06/11/2021   Visit Diagnoses:  1. Tear of left supraspinatus tendon     Plan: Patient is a 40 year old female who presents s/p left shoulder biceps tenodesis and mini open rotator cuff tear repair on 06/11/2021.  She is doing well overall with no significant problems that she is reporting.  She does have continued stiffness but feels this is improving.  She is going to physical therapy 1-2 times per week in [place] and doing a daily home exercise program as well.  She does endorse continued difficulty sleeping.  Taking Celebrex 200 mg twice daily as well as occasional Tylenol.  She has not started strengthening in physical therapy yet.  On exam she has well-healed incisions.  Axillary nerve intact with deltoid firing.  25 degrees external rotation, 75 degrees abduction, 125 degrees forward flexion.  Active range of motion equivalent to passive range of motion.  Rotator cuff strength rated 5 -/5 of supra, infra, subscap.  She is progressing well and the main barrier she seems to be facing is stiffness with passive range of motion.  She will continue to work on this over the next 6 weeks and all the exercises that she discussed today seem to be appropriate.  She is okay to start rotator cuff strengthening exercises at this point.  Follow-up in 6 weeks for clinical recheck with Dr. Marlou Sa especially regarding her range of motion.  Follow-Up Instructions: No follow-ups on file.   Orders:  No orders of the defined types were placed in this encounter.  No orders of the defined types were placed in this encounter.   Imaging: No results found.  PMFS  History: Patient Active Problem List   Diagnosis Date Noted   Bilateral knee pain 08/01/2021   Rheumatoid factor positive 07/10/2021   High risk medication use 07/10/2021   Essential hypertension 11/24/2020   Nonrheumatic mitral valve regurgitation 11/24/2020   Sacroiliac joint pain 01/12/2020   Smoker 11/30/2015   Palpitations 04/27/2014   Vasovagal syncope 04/26/2014   Spondylosis 02/24/2014   Hip pain 12/12/2013   Fibromyalgia 02/10/2013   Somatization disorder 02/10/2013   Pseudoseizures ([place]) 02/10/2013   DDD (degenerative disc disease), lumbosacral 02/10/2013   Spells 02/25/2012   History of idiopathic seizure 10/14/2011   SMOKER 05/20/2010   COPD (chronic obstructive pulmonary disease) ([place]) 05/20/2010   SHOULDER PAIN 10/27/2008   NECK PAIN 10/27/2008   Past Medical History:  Diagnosis Date   Anemia    Anxiety    Back pain    Bell's palsy    Contraceptive management 11/30/2015   DDD (degenerative disc disease)    DDD (degenerative disc disease)    Degenerative disc disease    Fibromyalgia    Fibromyalgia    History of kidney stones    Hypertension    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HCC)    Myositis    Ocular migraine    Palpitations    PVC (premature ventricular contraction)    Scoliosis    Seizures ([place])    unknown etiolgy; no meds and no seizures for  2 years.   Smoker 11/30/2015   Spondylosis     Family History  Problem Relation Age of Onset   Multiple sclerosis Mother    Heart disease Father    Kidney disease Father    Alcohol abuse Brother    Mental retardation Brother    Schizophrenia Brother    Other Brother        back problems   ALS Brother    Colon cancer Maternal Grandmother    Healthy Son    Healthy Son     Past Surgical History:  Procedure Laterality Date   DILATION AND CURETTAGE OF UTERUS  11/18/1996   DILITATION & CURRETTAGE/HYSTROSCOPY WITH NOVASURE ABLATION N/A 01/16/2016   Procedure: HYSTEROSCOPY WITH NOVASURE ABLATION;  Surgeon: Jonnie Kind, MD;  Location: [facility];  Service: Gynecology;  Laterality: N/A;  Uterine Cavity Length 4.0 cm Uterine Cavity Width 3.3cm Power 73  Time 1 minute 17seconds   LAPAROSCOPIC BILATERAL SALPINGECTOMY Bilateral 01/16/2016   Procedure: LAPAROSCOPIC BILATERAL SALPINGECTOMY;  Surgeon: Jonnie Kind, MD;  Location: [facility];  Service: Gynecology;  Laterality: Bilateral;  procedure 1   OOPHORECTOMY  11/18/2004   Left side   SHOULDER ARTHROSCOPY Left 06/04/2021   Left shoulder arthroscopy with biceps tenodesis mini open rotator cuff tear repair   Social History   Occupational History   Not on file  Tobacco Use   Smoking status: Former    Packs/day: 1.00    Years: 20.00    Pack years: 20.00    Types: Cigarettes    Quit date: 09/12/2016    Years since quitting: 4.9   Smokeless tobacco: Never  Vaping Use   Vaping Use: Every day  Substance and Sexual Activity   Alcohol use: Yes    Comment: socially   Drug use: Never   Sexual activity: Yes    Birth control/protection: Surgical    Comment: tubal and ablation

## 2021-08-08 DIAGNOSIS — R293 Abnormal posture: Secondary | ICD-10-CM | POA: Diagnosis not present

## 2021-08-08 DIAGNOSIS — M25512 Pain in left shoulder: Secondary | ICD-10-CM | POA: Diagnosis not present

## 2021-08-08 DIAGNOSIS — M25612 Stiffness of left shoulder, not elsewhere classified: Secondary | ICD-10-CM | POA: Diagnosis not present

## 2021-08-08 DIAGNOSIS — Z4789 Encounter for other orthopedic aftercare: Secondary | ICD-10-CM | POA: Diagnosis not present

## 2021-08-10 DIAGNOSIS — M25512 Pain in left shoulder: Secondary | ICD-10-CM | POA: Diagnosis not present

## 2021-08-10 DIAGNOSIS — M25612 Stiffness of left shoulder, not elsewhere classified: Secondary | ICD-10-CM | POA: Diagnosis not present

## 2021-08-10 DIAGNOSIS — R293 Abnormal posture: Secondary | ICD-10-CM | POA: Diagnosis not present

## 2021-08-10 DIAGNOSIS — Z4789 Encounter for other orthopedic aftercare: Secondary | ICD-10-CM | POA: Diagnosis not present

## 2021-08-12 ENCOUNTER — Other Ambulatory Visit: Payer: Self-pay | Admitting: Family Medicine

## 2021-08-14 DIAGNOSIS — M25512 Pain in left shoulder: Secondary | ICD-10-CM | POA: Diagnosis not present

## 2021-08-14 DIAGNOSIS — M25612 Stiffness of left shoulder, not elsewhere classified: Secondary | ICD-10-CM | POA: Diagnosis not present

## 2021-08-14 DIAGNOSIS — Z4789 Encounter for other orthopedic aftercare: Secondary | ICD-10-CM | POA: Diagnosis not present

## 2021-08-14 DIAGNOSIS — R293 Abnormal posture: Secondary | ICD-10-CM | POA: Diagnosis not present

## 2021-08-17 DIAGNOSIS — Z4789 Encounter for other orthopedic aftercare: Secondary | ICD-10-CM | POA: Diagnosis not present

## 2021-08-17 DIAGNOSIS — M25612 Stiffness of left shoulder, not elsewhere classified: Secondary | ICD-10-CM | POA: Diagnosis not present

## 2021-08-17 DIAGNOSIS — M25512 Pain in left shoulder: Secondary | ICD-10-CM | POA: Diagnosis not present

## 2021-08-17 DIAGNOSIS — R293 Abnormal posture: Secondary | ICD-10-CM | POA: Diagnosis not present

## 2021-08-20 ENCOUNTER — Encounter: Payer: Self-pay | Admitting: Family Medicine

## 2021-08-20 ENCOUNTER — Ambulatory Visit (INDEPENDENT_AMBULATORY_CARE_PROVIDER_SITE_OTHER): Payer: Medicare Other | Admitting: Family Medicine

## 2021-08-20 ENCOUNTER — Other Ambulatory Visit: Payer: Self-pay

## 2021-08-20 VITALS — BP 130/62 | HR 84 | Temp 98.1°F | Resp 16 | Ht 61.0 in | Wt 115.0 lb

## 2021-08-20 DIAGNOSIS — M25612 Stiffness of left shoulder, not elsewhere classified: Secondary | ICD-10-CM | POA: Diagnosis not present

## 2021-08-20 DIAGNOSIS — M25512 Pain in left shoulder: Secondary | ICD-10-CM | POA: Diagnosis not present

## 2021-08-20 DIAGNOSIS — G8929 Other chronic pain: Secondary | ICD-10-CM

## 2021-08-20 NOTE — Progress Notes (Signed)
Subjective:    Patient ID: Theresa Washington, female    DOB: [birth year], 40 y.o.   MRN: [id number]  01/08/21 Patient presents today with pain in her left shoulder.  She states is been hurting for 4 weeks.  However over the last few days has become unbearable.  She states that it aches and throbs no matter what she does.  At night she has trouble sleeping due to the aching pain in her shoulder.  The pain is located below the clavicle in the subacromial space.  She also reports pain with abduction greater than approximately 60 degrees.  Even passive range of motion greater than 60 degrees elicits pain.  She has pain with empty can testing.  She has pain with Hawkins maneuver.  However she has normal strength and no evidence of any muscle weakness.  At that time, my plan was: I believe the patient has impingement syndrome due to subacromial bursitis and perhaps even tendinitis in the supraspinatus muscle.  Using sterile technique, I injected the subacromial space with 2 cc of lidocaine, 2 cc of Marcaine, and 2 cc of 40 mg/mL Kenalog.  The patient tolerated the procedure well without complication.  She was already taking Celebrex without relief and therefore she had failed conservative therapy.  Recheck in 2 weeks if no better or sooner if worse  03/26/21 Patient presents today with 2 concerns.  First she continues to have pain in her left shoulder.  She states that the subacromial cortisone injection I performed in February may have helped for 2 weeks then the pain came back immediately thereafter.  She reports pain with abduction.  She reports pain with internal and external rotation.  It hurts to sleep on her shoulder at night.  She reports an aching throbbing pain with activity.  Therefore I am concerned about a partial tear of the rotator cuff.  Second concern is pain in her left hip.  MRI showed a labral tear in 2021.  She had surgery at [facility] to repair the labral tear in her left hip.  Over the  last 2 months she has developed pain in her left lower back roughly around the level of L5 just to the left of the lumbar spine however it radiates down into the anterior left hip.  She has pain with hip flexion as well as external rotation.  She also has tenderness to palpation in the lower back.  She has pain with resisted hip flexion.  She does complain of a an aching throb radiating down into her left posterior gluteus muscle.  She states the pain is similar to sciatica she has had in the past however its not going below her gluteus muscle at the present time.  At that time, my Maxie Better was: Proceed with an MRI of the left shoulder to evaluate for the presence of rotator cuff tear.   IMPRESSION: Supraspinatus and infraspinatus tendinopathy with a 0.4 cm from front to back undersurface tear of the anterior and far lateral supraspinatus. No retraction or atrophy. The exam is otherwise negative.  Was referred to Dr. Marlou Sa (ortho) who preformed surgery.  I copied his most recent note (9/19): Plan: Patient is a 40 year old female who presents s/p left shoulder biceps tenodesis and mini open rotator cuff tear repair on 06/11/2021.  She is doing well overall with no significant problems that she is reporting.  She does have continued stiffness but feels this is improving.  She is going to physical therapy 1-2 times  per week in [place] and doing a daily home exercise program as well.  She does endorse continued difficulty sleeping.  Taking Celebrex 200 mg twice daily as well as occasional Tylenol.  She has not started strengthening in physical therapy yet.  08/20/21 Physical therapy is working with the patient.  They do not feel that she is progressing as much as she should.  Her range of motion is definitely less than they expect per her report.  Today I am only able to externally rotate her arm approximately 5 degrees due to resisted and decreased range of motion.  Internal rotation is able to be rotated to 45  degrees.  Abduction is limited at best to 95 to 100 degrees.  With the patient's arm fully abducted from the body, she is unable to turn her elbow to the ground.  She can only rotated approximately 45 degrees.  She cannot turn her palm up.  All of this is due to stiffness and decreased range of motion.  Physical therapy is concerned about possible "encapsulation".  I believe they are referring to a frozen shoulder.  Past Medical History:  Diagnosis Date   Anemia    Anxiety    Back pain    Bell's palsy    Contraceptive management 11/30/2015   DDD (degenerative disc disease)    DDD (degenerative disc disease)    Degenerative disc disease    Fibromyalgia    Fibromyalgia    History of kidney stones    Hypertension    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HCC)    Myositis    Ocular migraine    Palpitations    PVC (premature ventricular contraction)    Scoliosis    Seizures ([place])    unknown etiolgy; no meds and no seizures for 2 years.   Smoker 11/30/2015   Spondylosis    Past Surgical History:  Procedure Laterality Date   DILATION AND CURETTAGE OF UTERUS  11/18/1996   DILITATION & CURRETTAGE/HYSTROSCOPY WITH NOVASURE ABLATION N/A 01/16/2016   Procedure: HYSTEROSCOPY WITH NOVASURE ABLATION;  Surgeon: Jonnie Kind, MD;  Location: [facility];  Service: Gynecology;  Laterality: N/A;  Uterine Cavity Length 4.0 cm Uterine Cavity Width 3.3cm Power 73  Time 1 minute 17seconds   LAPAROSCOPIC BILATERAL SALPINGECTOMY Bilateral 01/16/2016   Procedure: LAPAROSCOPIC BILATERAL SALPINGECTOMY;  Surgeon: Jonnie Kind, MD;  Location: [facility];  Service: Gynecology;  Laterality: Bilateral;  procedure 1   OOPHORECTOMY  11/18/2004   Left side   SHOULDER ARTHROSCOPY Left 06/04/2021   Left shoulder arthroscopy with biceps tenodesis mini open rotator cuff tear repair   Current Outpatient Medications on File Prior to Visit  Medication Sig Dispense Refill   acetaminophen (TYLENOL) 500 MG tablet Take 500 mg by mouth every 6  (six) hours as needed.     celecoxib (CELEBREX) 100 MG capsule TAKE 1 CAPSULE(100 MG) BY MOUTH TWICE DAILY 60 capsule 3   diclofenac sodium (VOLTAREN) 1 % GEL Apply topically as needed.     EPINEPHrine (EPIPEN JR) 0.15 MG/0.3ML injection Inject 0.15 mg into the muscle as needed for anaphylaxis.     glucosamine-chondroitin 500-400 MG tablet Take 1 tablet by mouth 3 (three) times daily.     methocarbamol (ROBAXIN) 500 MG tablet Take 1 tablet (500 mg total) by mouth every 8 (eight) hours as needed for muscle spasms. 30 tablet 0   methylcellulose (ARTIFICIAL TEARS) 1 % ophthalmic solution Place 1 drop into both eyes daily as needed. Dry Eyes     Omega-3  Fatty Acids (FISH OIL PO) Take by mouth.     oxyCODONE (OXY IR/ROXICODONE) 5 MG immediate release tablet Take 1 tablet (5 mg total) by mouth every 4 (four) hours as needed for severe pain. (Patient not taking: No sig reported) 30 tablet 0   oxycodone (OXY-IR) 5 MG capsule Take 1 capsule (5 mg total) by mouth every 4 (four) hours as needed. (Patient not taking: No sig reported) 30 capsule 0   predniSONE (DELTASONE) 10 MG tablet Take 1 tablet (10 mg total) by mouth daily with breakfast. Take 40mg  on days 1-2. Take 30mg  on days 3-4. Take 20mg  on days 5-6. Take 10mg  on days 7-8. Take 5mg  on days 9-10, then stop. 21 tablet 0   RESVERATROL PO Take by mouth.     rizatriptan (MAXALT) 10 MG tablet Take 1 tablet (10 mg total) by mouth as needed for migraine. May repeat in 2 hours if needed 10 tablet 3   topiramate (TOPAMAX) 25 MG tablet TAKE 2 TABLETS(50 MG) BY MOUTH TWICE DAILY 360 tablet 1   No current facility-administered medications on file prior to visit.   Allergies  Allergen Reactions   Shrimp [Shellfish Allergy] Anaphylaxis, Hives and Swelling   Other     Pt states "I carry an Epipen. I had anaphylactic reaction to something but I don't know what".    Social History   Socioeconomic History   Marital status: Married    Spouse name: Not on file    Number of children: Not on file   Years of education: Not on file   Highest education level: Not on file  Occupational History   Not on file  Tobacco Use   Smoking status: Former    Packs/day: 1.00    Years: 20.00    Pack years: 20.00    Types: Cigarettes    Quit date: 09/12/2016    Years since quitting: 4.9   Smokeless tobacco: Never  Vaping Use   Vaping Use: Every day  Substance and Sexual Activity   Alcohol use: Yes    Comment: socially   Drug use: Never   Sexual activity: Yes    Birth control/protection: Surgical    Comment: tubal and ablation  Other Topics Concern   Not on file  Social History Narrative   Not on file   Social Determinants of Health   Financial Resource Strain: Not on file  Food Insecurity: Not on file  Transportation Needs: Not on file  Physical Activity: Not on file  Stress: Not on file  Social Connections: Not on file  Intimate Partner Violence: Not on file       Review of Systems  All other systems reviewed and are negative.     Objective:   Physical Exam Vitals reviewed.  Constitutional:      General: She is not in acute distress.    Appearance: She is well-developed. She is not diaphoretic.  Cardiovascular:     Rate and Rhythm: Normal rate and regular rhythm.     Heart sounds: Normal heart sounds.  Pulmonary:     Effort: Pulmonary effort is normal. No respiratory distress.     Breath sounds: Normal breath sounds. No wheezing or rales.  Abdominal:     General: Bowel sounds are normal. There is no distension.     Palpations: Abdomen is soft.     Tenderness: There is no abdominal tenderness. There is no guarding or rebound.  Musculoskeletal:     Left shoulder: Tenderness present. No  swelling, effusion or bony tenderness. Decreased range of motion. Decreased strength.     Left hip: Tenderness present. No bony tenderness or crepitus. Normal range of motion. Decreased strength.  Skin:    General: Skin is warm and dry.         Assessment & Plan:  Chronic left shoulder pain - Plan: Sedimentation rate, C-reactive protein  Decreased range of motion of left shoulder  I do not feel that the issue going on in her shoulder is an autoimmune process.  There is no redness.  There is no swelling.  There is no effusion.  I will be glad to check a sed rate and a CRP.  She does have a history of a positive rheumatoid factor however I feel that this is most likely a false positive given the fact her CCP test was negative.  If the sed rate and the CRP are normal, I do not feel that this is an autoimmune process.  Rather I feel that this is likely scar tissue causing adhesive capsulitis.  If the labs are normal, I would recommend that the patient follow back up with her surgeon to see if she would be a candidate for manipulation under anesthesia to improve range of motion

## 2021-08-21 DIAGNOSIS — M25512 Pain in left shoulder: Secondary | ICD-10-CM | POA: Diagnosis not present

## 2021-08-21 DIAGNOSIS — M25612 Stiffness of left shoulder, not elsewhere classified: Secondary | ICD-10-CM | POA: Diagnosis not present

## 2021-08-21 DIAGNOSIS — Z4789 Encounter for other orthopedic aftercare: Secondary | ICD-10-CM | POA: Diagnosis not present

## 2021-08-21 DIAGNOSIS — R293 Abnormal posture: Secondary | ICD-10-CM | POA: Diagnosis not present

## 2021-08-21 LAB — C-REACTIVE PROTEIN: CRP: 0.3 mg/L (ref <8.0)

## 2021-08-21 LAB — SEDIMENTATION RATE: Sed Rate: 6 mm/h (ref 0–20)

## 2021-08-31 ENCOUNTER — Ambulatory Visit: Payer: Medicare Other | Admitting: Internal Medicine

## 2021-08-31 ENCOUNTER — Other Ambulatory Visit: Payer: Self-pay

## 2021-08-31 ENCOUNTER — Telehealth: Payer: Self-pay

## 2021-08-31 ENCOUNTER — Encounter: Payer: Self-pay | Admitting: Orthopedic Surgery

## 2021-08-31 ENCOUNTER — Ambulatory Visit (INDEPENDENT_AMBULATORY_CARE_PROVIDER_SITE_OTHER): Payer: Medicare Other | Admitting: Orthopedic Surgery

## 2021-08-31 ENCOUNTER — Encounter: Payer: Self-pay | Admitting: Internal Medicine

## 2021-08-31 VITALS — BP 130/76 | HR 67 | Resp 16 | Ht 60.0 in | Wt 114.0 lb

## 2021-08-31 DIAGNOSIS — R768 Other specified abnormal immunological findings in serum: Secondary | ICD-10-CM | POA: Diagnosis not present

## 2021-08-31 DIAGNOSIS — M7502 Adhesive capsulitis of left shoulder: Secondary | ICD-10-CM

## 2021-08-31 DIAGNOSIS — M7062 Trochanteric bursitis, left hip: Secondary | ICD-10-CM | POA: Diagnosis not present

## 2021-08-31 MED ORDER — PREDNISONE 10 MG PO TABS: ORAL_TABLET | ORAL | 0 refills | Status: AC

## 2021-08-31 NOTE — Telephone Encounter (Signed)
I tried returning call left voice mail at this time. I was interested in seeing her again with symptoms increased we could add on although probably a delay since morning schedule is full. I am not sure if I specifically wanted to draw fluid from her bursa this can be hard to say for sure if there is fluid to draw before inspection such as ultrasound. Could recheck serum inflammatory markers if no local fluid able to be drawn.

## 2021-08-31 NOTE — Telephone Encounter (Signed)
Patient called stating Dr. Benjamine Mola wanted her to call the office when she was having a flair in her hips so he could draw fluid from her bursa to test.  Patient states she has an appointment with Dr. Marlou Sa today at 9:00 am for her left shoulder.  Patient requested a return call.

## 2021-08-31 NOTE — Progress Notes (Signed)
Office Visit Note  [name]             Date of Birth: November 28, 1980           MRN: [id number]             PCP: Susy Frizzle, MD Referring: Susy Frizzle, MD Visit Date: 08/31/2021  Subjective:  Pain of the Right Hip and Hip Pain of the Left Hip    History of Present Illness: Theresa Washington is a 40 y.o. female here with increased bilateral left worse than right hip pain this is ongoing for the past 5 days most severe with lying on her side or direct pressure, also some pain with standing and sitting motion. These symptoms feel similar to previous episodes of lateral hip pain she has had past treatments with local injection or oral steroids for trochanteric bursitis. She saw Dr. Marlou Sa earlier today for left shoulder pain and decreased ROM appears to be adhesive capsulitis after her arthroscopy and being scheduled for manipulation under anesthesia for treatment. Inflammatory serology was checked again by Dr. Dennard Schaumann on 10/3 was negative.  Review of Systems  Constitutional:  Positive for fatigue.  HENT:  Positive for mouth dryness.   Eyes:  Positive for dryness.  Respiratory:  Negative for shortness of breath.   Cardiovascular:  Negative for swelling in legs/feet.  Gastrointestinal:  Negative for constipation.  Endocrine: Positive for cold intolerance and excessive thirst.  Genitourinary:  Negative for difficulty urinating.  Musculoskeletal:  Positive for joint pain, joint pain, joint swelling, muscle weakness, morning stiffness and muscle tenderness.  Skin:  Negative for rash.  Allergic/Immunologic: Negative for susceptible to infections.  Neurological:  Positive for weakness.  Hematological:  Negative for bruising/bleeding tendency.  Psychiatric/Behavioral:  Positive for sleep disturbance.     PMFS History:  Patient Active Problem List   Diagnosis Date Noted   Screening for diabetes mellitus 04/03/2022   Encounter for screening fecal occult blood  testing 04/03/2022   Encounter for gynecological examination with Papanicolaou smear of cervix 04/03/2022   Mouth ulcers 03/27/2022   Livedo reticularis 01/08/2022   Rash and other nonspecific skin eruption 11/30/2021   Adhesive capsulitis of left shoulder    Trochanteric bursitis, left hip 08/31/2021   Bilateral knee pain 08/01/2021   Rheumatoid arthritis with rheumatoid factor of multiple sites without organ or systems involvement ([place]) 07/10/2021   High risk medication use 07/10/2021   Essential hypertension 11/24/2020   Nonrheumatic mitral valve regurgitation 11/24/2020   Sacroiliac joint pain 01/12/2020   Smoker 11/30/2015   Palpitations 04/27/2014   Vasovagal syncope 04/26/2014   Spondylosis 02/24/2014   Hip pain 12/12/2013   Fibromyalgia 02/10/2013   Somatization disorder 02/10/2013   Pseudoseizures 02/10/2013   DDD (degenerative disc disease), lumbosacral 02/10/2013   Spells 02/25/2012   History of idiopathic seizure 10/14/2011   SMOKER 05/20/2010   COPD (chronic obstructive pulmonary disease) ([place]) 05/20/2010   SHOULDER PAIN 10/27/2008   NECK PAIN 10/27/2008    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[place])    Myositis    Ocular migraine  Palpitations    yrs ago - only one episode r/t stress   PVC (premature ventricular contraction)    yrs ago - only one episode   Scoliosis    Seizures ([place])    unknown etiolgy; no meds, last seizure was in 2014   Smoker 11/30/2015   quit 2015   Spondylosis     Family History  Problem Relation Age of Onset   Multiple sclerosis Mother    Heart disease Father    Kidney disease Father    Alcohol abuse Brother    Mental retardation Brother    Schizophrenia  Brother    Other Brother        back problems   ALS Brother    Colon cancer Maternal Grandmother    Healthy Son    Healthy Son    Past Surgical History:  Procedure Laterality Date   DILATION AND CURETTAGE OF UTERUS  11/18/1996   DILITATION & CURRETTAGE/HYSTROSCOPY WITH NOVASURE ABLATION N/A 01/16/2016   Procedure: HYSTEROSCOPY WITH NOVASURE ABLATION;  Surgeon: Jonnie Kind, MD;  Location: [facility];  Service: Gynecology;  Laterality: N/A;  Uterine Cavity Length 4.0 cm Uterine Cavity Width 3.3cm Power 73  Time 1 minute 17seconds   HIP SURGERY Left 2021   LAPAROSCOPIC BILATERAL SALPINGECTOMY Bilateral 01/16/2016   Procedure: LAPAROSCOPIC BILATERAL SALPINGECTOMY;  Surgeon: Jonnie Kind, MD;  Location: [facility];  Service: Gynecology;  Laterality: Bilateral;  procedure 1   OOPHORECTOMY  11/18/2004   Left side   SHOULDER ARTHROSCOPY Left 06/04/2021   Left shoulder arthroscopy with biceps tenodesis mini open rotator cuff tear repair   SHOULDER ARTHROSCOPY Left 09/04/2021   Procedure: LEFT SHOULDER MANIPULATION UNDER ANESTHESIA, ARTHROSCOPY WITH ROTATOR INTERVAL RELEASE;  Surgeon: Meredith Pel, MD;  Location: [facility];  Service: Orthopedics;  Laterality: Left;   Social History   Social History Narrative   2 sons, Truddie Crumble and [place]den.   Immunization History  Administered Date(s) Administered   Influenza Whole 07/19/2012   Influenza,inj,Quad PF,6+ Mos 09/15/2015, 10/17/2017   Influenza-Unspecified 08/30/2011   Moderna Sars-Covid-2 Vaccination 08/26/2020, 09/23/2020   Tdap 03/01/2011     Objective: Vital Signs: BP 130/76 (BP Location: Right Arm, Patient Position: Sitting, Cuff Size: Normal)   Pulse 67   Resp 16   Ht 5' (1.524 m)   Wt 114 lb (51.7 kg)   BMI 22.26 kg/m    Physical Exam Cardiovascular:     Rate and Rhythm: Normal rate and regular rhythm.  Pulmonary:     Effort: Pulmonary effort is normal.     Breath sounds: Normal breath sounds.  Skin:    General: Skin is  warm and dry.     Comments: Livedo reticularis present on extremities no erythematous skin rashes  Neurological:     Mental Status: She is alert.  Psychiatric:        Mood and Affect: Mood normal.      Musculoskeletal Exam:  Elbows full ROM tenderness to palpation worst at medial and lateral epicondyles with no swelling Wrists full ROM no tenderness or swelling Fingers full ROM no tenderness or swelling Lateral hip tenderness to palpation bilaterally, some lateral pain provoked with hip rotation Knees full ROM, tenderness with range of motion and with palpation over medial joint line Ankles full ROM no tenderness or swelling  Investigation: No additional findings.  Imaging: Ultrasound renal complete  Result Date: 08/13/2022 CLINICAL DATA:  Left flank pain.  Evaluate for stones. EXAM: RENAL / URINARY TRACT ULTRASOUND COMPLETE  COMPARISON:  None Available. FINDINGS: Right Kidney: Renal measurements: 10.1 x 3.9 x 4.1 cm = volume: 84 mL. Echogenicity within normal limits. No mass or hydronephrosis visualized. Left Kidney: Renal measurements: 10.4 x 5.6 x 4.8 cm = volume: 148 mL. Echogenicity within normal limits. No mass or hydronephrosis visualized. Bladder: Appears normal for degree of bladder distention. Other: None. IMPRESSION: Normal study.  No stones or cause for left flank pain identified. Electronically Signed   By: Dorise Bullion III M.D.   On: 08/13/2022 15:15    Recent Labs: Lab Results  Component Value Date   WBC 9.0 08/12/2022   HGB 11.9 08/12/2022   PLT 323 08/12/2022   NA 140 08/12/2022   K 4.2 08/12/2022   CL 108 08/12/2022   CO2 24 08/12/2022   GLUCOSE 74 08/12/2022   BUN 7 08/12/2022   CREATININE 1.05 (H) 08/12/2022   BILITOT 0.2 08/12/2022   ALKPHOS 80 09/01/2015   AST 12 08/12/2022   ALT 18 08/12/2022   PROT 6.4 08/12/2022   ALBUMIN 4.0 09/01/2015   CALCIUM 8.9 08/12/2022   GFRAA 106 10/24/2020   QFTBGOLDPLUS NEGATIVE 07/10/2021    Speciality Comments:  No specialty comments available.  Procedures:  No procedures performed Allergies: Shrimp [shellfish allergy] and Other   Assessment / Plan:     Visit Diagnoses: Rheumatoid factor positive Trochanteric bursitis, left hip  Rheumatoid factor positive and increasing joint pains but still no definite synovitis on examination including knee pains with no effusion present.  Hip pain is more suggestive for trochanteric bursitis which she has sustained numerous times in the past.  Considering involvement of joint pain at multiple other areas will prescribe oral prednisone taper starting at 40 mg down over 8 days total rather than attempting trochanteric bursitis steroid injection at this time.  Orders: No orders of the defined types were placed in this encounter.   Meds ordered this encounter  Medications   predniSONE (DELTASONE) 10 MG tablet    Sig: Take 4 tablets (40 mg total) by mouth daily with breakfast for 2 days, THEN 3 tablets (30 mg total) daily with breakfast for 2 days, THEN 2 tablets (20 mg total) daily with breakfast for 2 days, THEN 1 tablet (10 mg total) daily with breakfast for 2 days.    Dispense:  20 tablet    Refill:  0      Follow-Up Instructions: No follow-ups on file.   Collier Salina, MD  Note - This record has been created using [employer].  Chart creation errors have been sought, but may not always  have been located. Such creation errors do not reflect on  the standard of medical care.

## 2021-08-31 NOTE — Progress Notes (Signed)
Post-Op Visit Note   [name]           Date of Birth: 1980/11/19           MRN: [id number] Visit Date: 08/31/2021 PCP: Susy Frizzle, MD   Assessment & Plan:  Chief Complaint:  Chief Complaint  Patient presents with   Left Shoulder - Pain   Visit Diagnoses:  1. Adhesive capsulitis of left shoulder     Plan: Theresa Washington is a 40 year old patient with left shoulder pain.  Has had surgery 3 months ago and did well initially but has had some stiffening of the shoulder.  Surgery was on July 18 about 3 months ago.  Physical therapy has noted decreased movement.  On exam she has only about 10 degrees of external rotation on the left passively 70 of abduction and 100 and forward flexion.  Cuff strength feels good.  Impression is adhesive capsulitis left shoulder.  Plan is left shoulder manipulation under anesthesia with rotator interval release.  I think the rotator interval release is indicated due to the stiffness with external rotation.  Risk and benefits are discussed including not limited to infection nerve vessel damage shoulder stiffness potential need for further surgery and tearing of the rotator cuff repair and fracture.  These complications are unlikely but recurrent stiffness is a possibility.  Plan to use CPM machine 6 hours a day for the first week after surgery.  All questions answered  Follow-Up Instructions: No follow-ups on file.   Orders:  No orders of the defined types were placed in this encounter.  No orders of the defined types were placed in this encounter.   Imaging: No results found.  PMFS History: Patient Active Problem List   Diagnosis Date Noted   Trochanteric bursitis, left hip 08/31/2021   Bilateral knee pain 08/01/2021   Rheumatoid factor positive 07/10/2021   High risk medication use 07/10/2021   Essential hypertension 11/24/2020   Nonrheumatic mitral valve regurgitation 11/24/2020   Sacroiliac joint pain 01/12/2020   Smoker  11/30/2015   Palpitations 04/27/2014   Vasovagal syncope 04/26/2014   Spondylosis 02/24/2014   Hip pain 12/12/2013   Fibromyalgia 02/10/2013   Somatization disorder 02/10/2013   Pseudoseizures ([place]) 02/10/2013   DDD (degenerative disc disease), lumbosacral 02/10/2013   Spells 02/25/2012   History of idiopathic seizure 10/14/2011   SMOKER 05/20/2010   COPD (chronic obstructive pulmonary disease) ([place]) 05/20/2010   SHOULDER PAIN 10/27/2008   NECK PAIN 10/27/2008   Past Medical History:  Diagnosis Date   Anemia    Anxiety    Back pain    Bell's palsy    Contraceptive management 11/30/2015   DDD (degenerative disc disease)    DDD (degenerative disc disease)    Degenerative disc disease    Fibromyalgia    Fibromyalgia    History of kidney stones    Hypertension    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   Smoker 11/30/2015   Spondylosis     Family History  Problem Relation Age of Onset   Multiple sclerosis Mother    Heart disease Father    Kidney disease Father    Alcohol abuse Brother    Mental retardation Brother    Schizophrenia Brother    Other  Brother        back problems   ALS Brother    Colon cancer Maternal Grandmother    Healthy Son    Healthy Son     Past Surgical History:  Procedure Laterality Date   DILATION AND CURETTAGE OF UTERUS  11/18/1996   DILITATION & CURRETTAGE/HYSTROSCOPY WITH NOVASURE ABLATION N/A 01/16/2016   Procedure: HYSTEROSCOPY WITH NOVASURE ABLATION;  Surgeon: Jonnie Kind, MD;  Location: [facility];  Service: Gynecology;  Laterality: N/A;  Uterine Cavity Length 4.0 cm Uterine Cavity Width 3.3cm Power 73  Time 1 minute 17seconds   LAPAROSCOPIC BILATERAL SALPINGECTOMY Bilateral 01/16/2016   Procedure: LAPAROSCOPIC BILATERAL SALPINGECTOMY;  Surgeon: Jonnie Kind, MD;   Location: [facility];  Service: Gynecology;  Laterality: Bilateral;  procedure 1   OOPHORECTOMY  11/18/2004   Left side   SHOULDER ARTHROSCOPY Left 06/04/2021   Left shoulder arthroscopy with biceps tenodesis mini open rotator cuff tear repair   Social History   Occupational History   Not on file  Tobacco Use   Smoking status: Former    Packs/day: 1.00    Years: 20.00    Pack years: 20.00    Types: Cigarettes    Quit date: 09/12/2016    Years since quitting: 4.9   Smokeless tobacco: Never  Vaping Use   Vaping Use: Every day  Substance and Sexual Activity   Alcohol use: Yes    Comment: socially   Drug use: Never   Sexual activity: Yes    Birth control/protection: Surgical    Comment: tubal and ablation

## 2021-09-03 ENCOUNTER — Encounter (HOSPITAL_COMMUNITY): Payer: Self-pay | Admitting: Orthopedic Surgery

## 2021-09-03 NOTE — Progress Notes (Signed)
Anesthesia Chart Review: Same-day work-up  Follows with cardiology for history of vasovagal syncope, HTN, and mitral regurg.  Last seen by Dr. Gasper Sells 11/24/2020.  Exercise tolerance test and echocardiogram ordered given episode of possible exercise related syncope. ETT was negative without evidence of ischemia given workload, no exercise-induced arrhythmias.  Echo showed EF 60-65% with mild to moderate mitral valve regurgitation.  Patient will need day of surgery labs and evaluation.  EKG 10/24/2020: Sinus rhythm.  Rate 70.  CHEST - 2 VIEW 07/10/2021: COMPARISON:  Radiograph 09/15/2014   FINDINGS: The heart size and mediastinal contours are within normal limits.No focal airspace disease. No pleural effusion or pneumothorax.No acute osseous abnormality.   IMPRESSION: No evidence of acute cardiopulmonary disease.  TTE 01/09/2021:  1. Left ventricular ejection fraction, by estimation, is 60 to 65%. The  left ventricle has normal function. The left ventricle has no regional  wall motion abnormalities. Left ventricular diastolic parameters were  normal. The average left ventricular  global longitudinal strain is -21.1 %. The global longitudinal strain is  normal.   2. Right ventricular systolic function is normal. The right ventricular  size is normal. There is normal pulmonary artery systolic pressure.   3. The mitral valve is grossly normal. Mild to moderate mitral valve  regurgitation. No evidence of mitral stenosis.   4. The aortic valve is grossly normal. Aortic valve regurgitation is not  visualized. No aortic stenosis is present.   5. The inferior vena cava is normal in size with greater than 50%  respiratory variability, suggesting right atrial pressure of 3 mmHg.   Exercise tolerance test 01/09/2021: Blood pressure demonstrated a normal response to exercise. There was no ST segment deviation noted during stress. No T wave inversion was noted during stress. Overall, the  patient's exercise capacity was normal.   Negative stress test without evidence of ischemia at given workload. No exercise induced arrhythmias.    Wynonia Musty [facility] Short Stay Center/Anesthesiology Phone [phone] 09/03/2021 2:22 PM

## 2021-09-03 NOTE — Anesthesia Preprocedure Evaluation (Addendum)
Anesthesia Evaluation  Patient identified by MRN, date of birth, ID band Patient awake    Reviewed: Allergy & Precautions, NPO status , Patient's Chart, lab work & pertinent test results  Airway Mallampati: II  TM Distance: >3 FB Neck ROM: Full    Dental  (+) Dental Advisory Given, Missing   Pulmonary former smoker,    Pulmonary exam normal breath sounds clear to auscultation       Cardiovascular hypertension, negative cardio ROS Normal cardiovascular exam Rhythm:Regular Rate:Normal     Neuro/Psych  Headaches, Seizures -,  PSYCHIATRIC DISORDERS Anxiety    GI/Hepatic negative GI ROS, Neg liver ROS,   Endo/Other  negative endocrine ROS  Renal/GU negative Renal ROS     Musculoskeletal  (+) Arthritis , Fibromyalgia -left frozen shoulder   Abdominal   Peds  Hematology negative hematology ROS (+)   Anesthesia Other Findings Day of surgery medications reviewed with the patient.  Reproductive/Obstetrics negative OB ROS                            Anesthesia Physical Anesthesia Plan  ASA: 2  Anesthesia Plan: General   Post-op Pain Management:  Regional for Post-op pain   Induction: Intravenous  PONV Risk Score and Plan: 3 and Midazolam, Dexamethasone and Ondansetron  Airway Management Planned: Oral ETT  Additional Equipment:   Intra-op Plan:   Post-operative Plan: Extubation in OR  Informed Consent: I have reviewed the patients History and Physical, chart, labs and discussed the procedure including the risks, benefits and alternatives for the proposed anesthesia with the patient or authorized representative who has indicated his/her understanding and acceptance.     Dental advisory given  Plan Discussed with: CRNA  Anesthesia Plan Comments: (PAT note by Karoline Caldwell, PA-C:  Follows with cardiology for history of vasovagal syncope, HTN, and mitral regurg.  Last seen by Dr.  Gasper Sells 11/24/2020.  Exercise tolerance test and echocardiogram ordered given episode of possible exercise related syncope. ETT was negative without evidence of ischemia given workload, no exercise-induced arrhythmias.  Echo showed EF 60-65% with mild to moderate mitral valve regurgitation.  Patient will need day of surgery labs and evaluation.  EKG 10/24/2020: Sinus rhythm.  Rate 70.  CHEST - 2 VIEW 07/10/2021: COMPARISON: Radiograph 09/15/2014  FINDINGS: The heart size and mediastinal contours are within normal limits.No focal airspace disease. No pleural effusion or pneumothorax.No acute osseous abnormality.  IMPRESSION: No evidence of acute cardiopulmonary disease.  TTE 01/09/2021: 1. Left ventricular ejection fraction, by estimation, is 60 to 65%. The  left ventricle has normal function. The left ventricle has no regional  wall motion abnormalities. Left ventricular diastolic parameters were  normal. The average left ventricular  global longitudinal strain is -21.1 %. The global longitudinal strain is  normal.  2. Right ventricular systolic function is normal. The right ventricular  size is normal. There is normal pulmonary artery systolic pressure.  3. The mitral valve is grossly normal. Mild to moderate mitral valve  regurgitation. No evidence of mitral stenosis.  4. The aortic valve is grossly normal. Aortic valve regurgitation is not  visualized. No aortic stenosis is present.  5. The inferior vena cava is normal in size with greater than 50%  respiratory variability, suggesting right atrial pressure of 3 mmHg.   Exercise tolerance test 01/09/2021: . Blood pressure demonstrated a normal response to exercise. . There was no ST segment deviation noted during stress. [name] No T wave inversion was  noted during stress. . Overall, the patient's exercise capacity was normal.  Negative stress test without evidence of ischemia at given workload. No exercise induced  arrhythmias.  )       Anesthesia Quick Evaluation

## 2021-09-03 NOTE — Progress Notes (Signed)
DUE TO COVID-19 ONLY ONE VISITOR IS ALLOWED TO COME WITH YOU AND STAY IN THE WAITING ROOM ONLY DURING PRE OP AND PROCEDURE DAY OF SURGERY.  PCP - Dr Jenna Luo Cardiologist - Dr Rudean Haskell Rheumatology - Dr Vernelle Emerald  Chest x-ray - 07/10/21 (2V) EKG - 10/24/20 Stress Test - 01/09/21 ECHO - 01/09/21 Cardiac Cath - n/a  ICD Pacemaker/Loop - n/a  Sleep Study -  n/a CPAP - none  ERAS: Clear liquids til 11:30 am DOS.  Clear liquids reviewed with patient.  Anesthesia review: Yes  STOP now taking any Aspirin (unless otherwise instructed by your surgeon), Aleve, Naproxen, Ibuprofen, Motrin, Advil, Goody's, BC's, all herbal medications, fish oil, and all vitamins.   Coronavirus Screening Covid test n/a Ambulatory Surgery Do you have any of the following symptoms:  Cough yes/no: No Fever (>100.78F)  yes/no: No Runny nose yes/no: No Sore throat yes/no: No Difficulty breathing/shortness of breath  yes/no: No  Have you traveled in the last 14 days and where? yes/no: No  Patient verbalized understanding of instructions that were given via phone.

## 2021-09-04 ENCOUNTER — Ambulatory Visit (HOSPITAL_COMMUNITY): Payer: Medicare Other | Admitting: Physician Assistant

## 2021-09-04 ENCOUNTER — Encounter (HOSPITAL_COMMUNITY): Payer: Self-pay | Admitting: Orthopedic Surgery

## 2021-09-04 ENCOUNTER — Encounter (HOSPITAL_COMMUNITY)
Admission: RE | Disposition: A | Discharge status: Home / Self Care | Payer: Self-pay | Source: Home / Self Care | Attending: Orthopedic Surgery

## 2021-09-04 ENCOUNTER — Other Ambulatory Visit: Payer: Self-pay

## 2021-09-04 ENCOUNTER — Ambulatory Visit (HOSPITAL_COMMUNITY)
Admission: RE | Admit: 2021-09-04 | Discharge: 2021-09-04 | Disposition: A | Discharge status: Home / Self Care | Payer: Medicare Other | Attending: Orthopedic Surgery | Admitting: Orthopedic Surgery

## 2021-09-04 DIAGNOSIS — J449 Chronic obstructive pulmonary disease, unspecified: Secondary | ICD-10-CM | POA: Diagnosis not present

## 2021-09-04 DIAGNOSIS — M7502 Adhesive capsulitis of left shoulder: Secondary | ICD-10-CM

## 2021-09-04 DIAGNOSIS — I340 Nonrheumatic mitral (valve) insufficiency: Secondary | ICD-10-CM | POA: Insufficient documentation

## 2021-09-04 DIAGNOSIS — Z87891 Personal history of nicotine dependence: Secondary | ICD-10-CM | POA: Insufficient documentation

## 2021-09-04 DIAGNOSIS — M797 Fibromyalgia: Secondary | ICD-10-CM | POA: Insufficient documentation

## 2021-09-04 DIAGNOSIS — Z01818 Encounter for other preprocedural examination: Secondary | ICD-10-CM

## 2021-09-04 DIAGNOSIS — I10 Essential (primary) hypertension: Secondary | ICD-10-CM | POA: Diagnosis not present

## 2021-09-04 DIAGNOSIS — M419 Scoliosis, unspecified: Secondary | ICD-10-CM | POA: Insufficient documentation

## 2021-09-04 DIAGNOSIS — Z91013 Allergy to seafood: Secondary | ICD-10-CM | POA: Diagnosis not present

## 2021-09-04 DIAGNOSIS — G8918 Other acute postprocedural pain: Secondary | ICD-10-CM | POA: Diagnosis not present

## 2021-09-04 HISTORY — PX: SHOULDER ARTHROSCOPY: SHX128

## 2021-09-04 LAB — CBC
HCT: 41.9 % (ref 36.0–46.0)
Hemoglobin: 13.7 g/dL (ref 12.0–15.0)
MCH: 30.4 pg (ref 26.0–34.0)
MCHC: 32.7 g/dL (ref 30.0–36.0)
MCV: 93.1 fL (ref 80.0–100.0)
Platelets: 377 10*3/uL (ref 150–400)
RBC: 4.5 MIL/uL (ref 3.87–5.11)
RDW: 12.3 % (ref 11.5–15.5)
WBC: 7.1 10*3/uL (ref 4.0–10.5)
nRBC: 0 % (ref 0.0–0.2)

## 2021-09-04 LAB — BASIC METABOLIC PANEL
Anion gap: 9 (ref 5–15)
BUN: 8 mg/dL (ref 6–20)
CO2: 21 mmol/L — ABNORMAL LOW (ref 22–32)
Calcium: 9.3 mg/dL (ref 8.9–10.3)
Chloride: 107 mmol/L (ref 98–111)
Creatinine, Ser: 0.85 mg/dL (ref 0.44–1.00)
GFR, Estimated: >60 mL/min (ref >60)
Glucose, Bld: 117 mg/dL — ABNORMAL HIGH (ref 70–99)
Potassium: 4.2 mmol/L (ref 3.5–5.1)
Sodium: 137 mmol/L (ref 135–145)

## 2021-09-04 LAB — POCT PREGNANCY, URINE: Preg Test, Ur: NEGATIVE

## 2021-09-04 SURGERY — ARTHROSCOPY, SHOULDER
Anesthesia: General | Site: Shoulder | Laterality: Left

## 2021-09-04 MED ORDER — OXYCODONE HCL 5 MG/5ML PO SOLN
5.0000 mg | Freq: Once | ORAL | Status: DC | PRN
Start: 2021-09-04 — End: 2021-09-04

## 2021-09-04 MED ORDER — SUGAMMADEX SODIUM 200 MG/2ML IV SOLN
INTRAVENOUS | Status: DC | PRN
  Administered 2021-09-04 (×3): 50 mg via INTRAVENOUS

## 2021-09-04 MED ORDER — FENTANYL CITRATE (PF) 250 MCG/5ML IJ SOLN
INTRAMUSCULAR | Status: DC | PRN
  Administered 2021-09-04: 50 ug via INTRAVENOUS

## 2021-09-04 MED ORDER — DEXAMETHASONE SODIUM PHOSPHATE 10 MG/ML IJ SOLN
INTRAMUSCULAR | Status: DC | PRN
  Administered 2021-09-04: 4 mg via INTRAVENOUS

## 2021-09-04 MED ORDER — ONDANSETRON HCL 4 MG/2ML IJ SOLN
INTRAMUSCULAR | Status: DC | PRN
  Administered 2021-09-04: 4 mg via INTRAVENOUS

## 2021-09-04 MED ORDER — ONDANSETRON HCL 4 MG/2ML IJ SOLN
INTRAMUSCULAR | Status: AC
  Filled 2021-09-04: qty 2

## 2021-09-04 MED ORDER — ROCURONIUM BROMIDE 10 MG/ML (PF) SYRINGE
PREFILLED_SYRINGE | INTRAVENOUS | Status: DC | PRN
  Administered 2021-09-04: 40 mg via INTRAVENOUS

## 2021-09-04 MED ORDER — POVIDONE-IODINE 10 % EX SWAB
2.0000 "application " | Freq: Once | CUTANEOUS | Status: AC
  Administered 2021-09-04: 2 via TOPICAL

## 2021-09-04 MED ORDER — MIDAZOLAM HCL 2 MG/2ML IJ SOLN
INTRAMUSCULAR | Status: AC
  Administered 2021-09-04: 2 mg via INTRAVENOUS
  Filled 2021-09-04: qty 2

## 2021-09-04 MED ORDER — 0.9 % SODIUM CHLORIDE (POUR BTL) OPTIME
TOPICAL | Status: DC | PRN
  Administered 2021-09-04: 1000 mL

## 2021-09-04 MED ORDER — LIDOCAINE 2% (20 MG/ML) 5 ML SYRINGE
INTRAMUSCULAR | Status: AC
  Filled 2021-09-04: qty 5

## 2021-09-04 MED ORDER — BUPIVACAINE HCL (PF) 0.5 % IJ SOLN
INTRAMUSCULAR | Status: DC | PRN
  Administered 2021-09-04: 10 mL via PERINEURAL

## 2021-09-04 MED ORDER — PROPOFOL 10 MG/ML IV BOLUS
INTRAVENOUS | Status: DC | PRN
  Administered 2021-09-04: 120 mg via INTRAVENOUS

## 2021-09-04 MED ORDER — ORAL CARE MOUTH RINSE: 15.0000 mL | Freq: Once | OROMUCOSAL | Status: DC

## 2021-09-04 MED ORDER — ACETAMINOPHEN 10 MG/ML IV SOLN: 1000.0000 mg | Freq: Once | INTRAVENOUS | Status: DC | PRN

## 2021-09-04 MED ORDER — CEFAZOLIN SODIUM-DEXTROSE 2-4 GM/100ML-% IV SOLN
INTRAVENOUS | Status: AC
  Filled 2021-09-04: qty 100

## 2021-09-04 MED ORDER — LIDOCAINE 2% (20 MG/ML) 5 ML SYRINGE
INTRAMUSCULAR | Status: DC | PRN
  Administered 2021-09-04: 40 mg via INTRAVENOUS

## 2021-09-04 MED ORDER — SODIUM CHLORIDE 0.9 % IR SOLN
Status: DC | PRN
  Administered 2021-09-04: 3000 mL

## 2021-09-04 MED ORDER — CHLORHEXIDINE GLUCONATE 0.12 % MT SOLN: 15.0000 mL | Freq: Once | OROMUCOSAL | Status: DC

## 2021-09-04 MED ORDER — MIDAZOLAM HCL 5 MG/5ML IJ SOLN
INTRAMUSCULAR | Status: DC | PRN
  Administered 2021-09-04: 2 mg via INTRAVENOUS

## 2021-09-04 MED ORDER — POVIDONE-IODINE 7.5 % EX SOLN
Freq: Once | CUTANEOUS | Status: DC
  Filled 2021-09-04: qty 118

## 2021-09-04 MED ORDER — OXYCODONE HCL 5 MG PO TABS: 5.0000 mg | ORAL_TABLET | Freq: Once | ORAL | Status: DC | PRN

## 2021-09-04 MED ORDER — MIDAZOLAM HCL 2 MG/2ML IJ SOLN
INTRAMUSCULAR | Status: AC
  Filled 2021-09-04: qty 2

## 2021-09-04 MED ORDER — ONDANSETRON HCL 4 MG/2ML IJ SOLN: 4.0000 mg | Freq: Once | INTRAMUSCULAR | Status: DC | PRN

## 2021-09-04 MED ORDER — DEXAMETHASONE SODIUM PHOSPHATE 10 MG/ML IJ SOLN
INTRAMUSCULAR | Status: AC
  Filled 2021-09-04: qty 1

## 2021-09-04 MED ORDER — MIDAZOLAM HCL 2 MG/2ML IJ SOLN
2.0000 mg | Freq: Once | INTRAMUSCULAR | Status: AC
  Filled 2021-09-04: qty 2

## 2021-09-04 MED ORDER — FENTANYL CITRATE (PF) 100 MCG/2ML IJ SOLN
INTRAMUSCULAR | Status: AC
  Administered 2021-09-04: 50 ug via INTRAVENOUS
  Filled 2021-09-04: qty 2

## 2021-09-04 MED ORDER — FENTANYL CITRATE (PF) 100 MCG/2ML IJ SOLN: 25.0000 ug | INTRAMUSCULAR | Status: DC | PRN

## 2021-09-04 MED ORDER — PHENYLEPHRINE 40 MCG/ML (10ML) SYRINGE FOR IV PUSH (FOR BLOOD PRESSURE SUPPORT)
PREFILLED_SYRINGE | INTRAVENOUS | Status: DC | PRN
  Administered 2021-09-04 (×2): 80 ug via INTRAVENOUS
  Administered 2021-09-04 (×2): 120 ug via INTRAVENOUS

## 2021-09-04 MED ORDER — FENTANYL CITRATE (PF) 100 MCG/2ML IJ SOLN: 100.0000 ug | Freq: Once | INTRAMUSCULAR | Status: AC

## 2021-09-04 MED ORDER — PROPOFOL 10 MG/ML IV BOLUS
INTRAVENOUS | Status: AC
  Filled 2021-09-04: qty 20

## 2021-09-04 MED ORDER — LACTATED RINGERS IV SOLN: INTRAVENOUS | Status: DC

## 2021-09-04 MED ORDER — FENTANYL CITRATE (PF) 250 MCG/5ML IJ SOLN
INTRAMUSCULAR | Status: AC
  Filled 2021-09-04: qty 5

## 2021-09-04 MED ORDER — ACETAMINOPHEN 500 MG PO TABS: 1000.0000 mg | ORAL_TABLET | Freq: Once | ORAL | Status: DC | PRN

## 2021-09-04 MED ORDER — CEFAZOLIN SODIUM-DEXTROSE 2-4 GM/100ML-% IV SOLN
2.0000 g | INTRAVENOUS | Status: AC
  Administered 2021-09-04: 2 g via INTRAVENOUS

## 2021-09-04 MED ORDER — METHOCARBAMOL 500 MG PO TABS: 500.0000 mg | ORAL_TABLET | Freq: Three times a day (TID) | ORAL | 0 refills | Status: DC | PRN

## 2021-09-04 MED ORDER — ACETAMINOPHEN 160 MG/5ML PO SOLN: 1000.0000 mg | Freq: Once | ORAL | Status: DC | PRN

## 2021-09-04 MED ORDER — HYDROCODONE-ACETAMINOPHEN 5-325 MG PO TABS: 1.0000 | ORAL_TABLET | ORAL | 0 refills | Status: DC | PRN

## 2021-09-04 MED ORDER — BUPIVACAINE LIPOSOME 1.3 % IJ SUSP
INTRAMUSCULAR | Status: DC | PRN
  Administered 2021-09-04: 10 mL via PERINEURAL

## 2021-09-04 MED ORDER — CHLORHEXIDINE GLUCONATE 0.12 % MT SOLN
OROMUCOSAL | Status: AC
  Administered 2021-09-04: 15 mL
  Filled 2021-09-04: qty 15

## 2021-09-04 SURGICAL SUPPLY — 47 items
AID PSTN UNV HD RSTRNT DISP (MISCELLANEOUS) ×1
ALCOHOL 70% 16 OZ (MISCELLANEOUS) ×3 IMPLANT
BAG COUNTER SPONGE SURGICOUNT (BAG) ×2 IMPLANT
BAG SPNG CNTER NS LX DISP (BAG) ×1
BAG SURGICOUNT SPONGE COUNTING (BAG) ×1
BLADE EXCALIBUR 4.0MM X 13CM (MISCELLANEOUS) ×1
BLADE EXCALIBUR 4.0X13 (MISCELLANEOUS) ×2 IMPLANT
BLADE SURG 11 STRL SS (BLADE) ×3 IMPLANT
DRAPE IMP U-DRAPE 54X76 (DRAPES) ×3 IMPLANT
DRAPE INCISE IOBAN 66X45 STRL (DRAPES) ×3 IMPLANT
DRAPE STERI 35X30 U-POUCH (DRAPES) ×3 IMPLANT
DRAPE U-SHAPE 47X51 STRL (DRAPES) ×6 IMPLANT
DRSG AQUACEL AG ADV 3.5X 4 (GAUZE/BANDAGES/DRESSINGS) ×3 IMPLANT
DRSG TEGADERM 4X4.75 (GAUZE/BANDAGES/DRESSINGS) ×6 IMPLANT
DW OUTFLOW CASSETTE/TUBE SET (MISCELLANEOUS) ×3 IMPLANT
ELECT REM PT RETURN 9FT ADLT (ELECTROSURGICAL) ×3
ELECTRODE REM PT RTRN 9FT ADLT (ELECTROSURGICAL) ×1 IMPLANT
GAUZE XEROFORM 1X8 LF (GAUZE/BANDAGES/DRESSINGS) ×5 IMPLANT
GLOVE SRG 8 PF TXTR STRL LF DI (GLOVE) ×1 IMPLANT
GLOVE SURG ENC TEXT LTX SZ6.5 (GLOVE) ×3 IMPLANT
GLOVE SURG LTX SZ8 (GLOVE) ×3 IMPLANT
GLOVE SURG UNDER POLY LF SZ6.5 (GLOVE) ×3 IMPLANT
GLOVE SURG UNDER POLY LF SZ8 (GLOVE) ×3
GOWN STRL REUS W/ TWL LRG LVL3 (GOWN DISPOSABLE) ×3 IMPLANT
GOWN STRL REUS W/TWL LRG LVL3 (GOWN DISPOSABLE) ×9
KIT BASIN OR (CUSTOM PROCEDURE TRAY) ×3 IMPLANT
KIT TURNOVER KIT B (KITS) ×3 IMPLANT
MANIFOLD NEPTUNE II (INSTRUMENTS) ×3 IMPLANT
NDL HYPO 25X1 1.5 SAFETY (NEEDLE) ×1 IMPLANT
NDL SPNL 18GX3.5 QUINCKE PK (NEEDLE) ×1 IMPLANT
NEEDLE HYPO 25X1 1.5 SAFETY (NEEDLE) ×3 IMPLANT
NEEDLE SPNL 18GX3.5 QUINCKE PK (NEEDLE) ×3 IMPLANT
NS IRRIG 1000ML POUR BTL (IV SOLUTION) ×3 IMPLANT
PACK SHOULDER (CUSTOM PROCEDURE TRAY) ×3 IMPLANT
PAD ARMBOARD 7.5X6 YLW CONV (MISCELLANEOUS) ×6 IMPLANT
PORT APPOLLO RF 90DEGREE MULTI (SURGICAL WAND) ×3 IMPLANT
RESTRAINT HEAD UNIVERSAL NS (MISCELLANEOUS) ×3 IMPLANT
SLING ARM FOAM STRAP LRG (SOFTGOODS) IMPLANT
SLING ARM FOAM STRAP MED (SOFTGOODS) ×2 IMPLANT
SPONGE T-LAP 4X18 ~~LOC~~+RFID (SPONGE) ×3 IMPLANT
SUCTION FRAZIER HANDLE 10FR (MISCELLANEOUS) ×3
SUCTION TUBE FRAZIER 10FR DISP (MISCELLANEOUS) IMPLANT
SUT ETHILON 3 0 PS 1 (SUTURE) ×5 IMPLANT
TOWEL GREEN STERILE (TOWEL DISPOSABLE) ×3 IMPLANT
TOWEL GREEN STERILE FF (TOWEL DISPOSABLE) ×3 IMPLANT
TUBING ARTHROSCOPY IRRIG 16FT (MISCELLANEOUS) ×3 IMPLANT
WATER STERILE IRR 1000ML POUR (IV SOLUTION) ×3 IMPLANT

## 2021-09-04 NOTE — Anesthesia Procedure Notes (Signed)
Procedure Name: Intubation Date/Time: 09/04/2021 4:16 PM Performed by: Renato Shin, CRNA Pre-anesthesia Checklist: Patient identified, Emergency Drugs available, Suction available and Patient being monitored Patient Re-evaluated:Patient Re-evaluated prior to induction Oxygen Delivery Method: Circle system utilized Preoxygenation: Pre-oxygenation with 100% oxygen Induction Type: IV induction Ventilation: Mask ventilation without difficulty Laryngoscope Size: Miller and 2 Grade View: Grade I Tube type: Oral Tube size: 7.0 mm Number of attempts: 1 Airway Equipment and Method: Stylet and Oral airway Placement Confirmation: ETT inserted through vocal cords under direct vision, positive ETCO2 and breath sounds checked- equal and bilateral Secured at: 21 cm Tube secured with: Tape Dental Injury: Teeth and Oropharynx as per pre-operative assessment

## 2021-09-04 NOTE — H&P (Signed)
Theresa Washington is an 40 y.o. female.   Chief Complaint: Left shoulder pain HPI: Theresa Washington is a 40 year old patient with left shoulder pain.  Had surgery 3 months ago and did well initially but has had some stiffening of the shoulder since that time.  Surgery was on June 04, 2021.  Physical therapy has noted decreased movement.  Denies any recent injury.  She does report diminished functional range of motion for activities of daily living.  Denies any weakness.  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[place])    Myositis    Ocular migraine    Palpitations    yrs ago - only one episode r/t stress   PVC (premature ventricular contraction)    yrs ago - only one episode   Scoliosis    Seizures ([place])    unknown etiolgy; no meds, last seizure was in 2014   Smoker 11/30/2015   quit 2015   Spondylosis     Past Surgical History:  Procedure Laterality Date   DILATION AND CURETTAGE OF UTERUS  11/18/1996   DILITATION & CURRETTAGE/HYSTROSCOPY WITH NOVASURE ABLATION N/A 01/16/2016   Procedure: HYSTEROSCOPY WITH NOVASURE ABLATION;  Surgeon: Jonnie Kind, MD;  Location: [facility];  Service: Gynecology;  Laterality: N/A;  Uterine Cavity Length 4.0 cm Uterine Cavity Width 3.3cm Power 73  Time 1 minute 17seconds   HIP SURGERY Left 2021   LAPAROSCOPIC BILATERAL SALPINGECTOMY Bilateral 01/16/2016   Procedure: LAPAROSCOPIC BILATERAL SALPINGECTOMY;  Surgeon: Jonnie Kind, MD;  Location: [facility];  Service: Gynecology;  Laterality: Bilateral;  procedure 1   OOPHORECTOMY  11/18/2004   Left side   SHOULDER ARTHROSCOPY Left 06/04/2021   Left shoulder arthroscopy with biceps tenodesis mini open rotator cuff tear repair    Family  History  Problem Relation Age of Onset   Multiple sclerosis Mother    Heart disease Father    Kidney disease Father    Alcohol abuse Brother    Mental retardation Brother    Schizophrenia Brother    Other Brother        back problems   ALS Brother    Colon cancer Maternal Grandmother    Healthy Son    Healthy Son    Social History:  reports that she quit smoking about 4 years ago. Her smoking use included cigarettes. She has a 20.00 pack-year smoking history. She has never used smokeless tobacco. She reports current alcohol use. She reports that she does not use drugs.  Allergies:  Allergies  Allergen Reactions   Shrimp [Shellfish Allergy] Anaphylaxis, Hives and Swelling    Pt states "I carry an Epipen. I had anaphylactic reaction to something but I don't know what".     Other     Adhesive tape caused blisters. Okay to use paper tape.    Medications Prior to Admission  Medication Sig Dispense Refill   acetaminophen (TYLENOL) 500 MG tablet Take 500 mg by mouth every 6 (six) hours as needed for moderate pain.     diclofenac sodium (VOLTAREN) 1 % GEL Apply 1 application topically as needed (pain).     predniSONE (  DELTASONE) 10 MG tablet Take 4 tablets (40 mg total) by mouth daily with breakfast for 2 days, THEN 3 tablets (30 mg total) daily with breakfast for 2 days, THEN 2 tablets (20 mg total) daily with breakfast for 2 days, THEN 1 tablet (10 mg total) daily with breakfast for 2 days. 20 tablet 0   rizatriptan (MAXALT) 10 MG tablet Take 1 tablet (10 mg total) by mouth as needed for migraine. May repeat in 2 hours if needed 10 tablet 3   topiramate (TOPAMAX) 25 MG tablet TAKE 2 TABLETS(50 MG) BY MOUTH TWICE DAILY 360 tablet 1   celecoxib (CELEBREX) 100 MG capsule TAKE 1 CAPSULE(100 MG) BY MOUTH TWICE DAILY 60 capsule 3   EPINEPHrine (EPIPEN JR) 0.15 MG/0.3ML injection Inject 0.15 mg into the muscle as needed for anaphylaxis.     methocarbamol (ROBAXIN) 500 MG tablet Take 1 tablet  (500 mg total) by mouth every 8 (eight) hours as needed for muscle spasms. (Patient not taking: Reported on 09/03/2021) 30 tablet 0   methylcellulose (ARTIFICIAL TEARS) 1 % ophthalmic solution Place 1 drop into both eyes daily as needed. Dry Eyes     Misc Natural Products (GLUCOSAMINE CHONDROITIN TRIPLE) TABS Take 1 tablet by mouth 2 (two) times daily.     Multiple Vitamin (MULTIVITAMIN WITH MINERALS) TABS tablet Take 1 tablet by mouth daily.     Omega-3 Fatty Acids (FISH OIL) 1000 MG CAPS Take 2,000 mg by mouth daily.      Results for orders placed or performed during the hospital encounter of 09/04/21 (from the past 48 hour(s))  CBC     Status: None   Collection Time: 09/04/21 12:32 PM  Result Value Ref Range   WBC 7.1 4.0 - 10.5 K/uL   RBC 4.50 3.87 - 5.11 MIL/uL   Hemoglobin 13.7 12.0 - 15.0 g/dL   HCT 41.9 36.0 - 46.0 %   MCV 93.1 80.0 - 100.0 fL   MCH 30.4 26.0 - 34.0 pg   MCHC 32.7 30.0 - 36.0 g/dL   RDW 12.3 11.5 - 15.5 %   Platelets 377 150 - 400 K/uL   nRBC 0.0 0.0 - 0.2 %    Comment: Performed at [facility], [place] [address], [place], [place] [address]  Basic metabolic panel     Status: Abnormal   Collection Time: 09/04/21 12:32 PM  Result Value Ref Range   Sodium 137 135 - 145 mmol/L   Potassium 4.2 3.5 - 5.1 mmol/L   Chloride 107 98 - 111 mmol/L   CO2 21 (L) 22 - 32 mmol/L   Glucose, Bld 117 (H) 70 - 99 mg/dL    Comment: Glucose reference range applies only to samples taken after fasting for at least 8 hours.   BUN 8 6 - 20 mg/dL   Creatinine, Ser 0.85 0.44 - 1.00 mg/dL   Calcium 9.3 8.9 - 10.3 mg/dL   GFR, Estimated >60 >60 mL/min    Comment: (NOTE) Calculated using the CKD-EPI Creatinine Equation (2021)    Anion gap 9 5 - 15    Comment: Performed at [facility] [address], [place], [place] [address]  Pregnancy, urine POC     Status: None   Collection Time: 09/04/21 12:45 PM  Result Value Ref Range   Preg Test, Ur NEGATIVE NEGATIVE     Comment:        THE SENSITIVITY OF THIS METHODOLOGY IS >24 mIU/mL    No results found.  Review of Systems  Musculoskeletal:  Positive for arthralgias.  All other systems reviewed and are negative.  Blood pressure 136/81, pulse 70, temperature 97.8 F (36.6 C), temperature source Oral, resp. rate 11, height 5' (1.524 m), weight 52.4 kg, SpO2 100 %. Physical Exam Vitals reviewed.  HENT:     Head: Normocephalic.     Nose: Nose normal.     Mouth/Throat:     Mouth: Mucous membranes are moist.  Eyes:     Pupils: Pupils are equal, round, and reactive to light.  Cardiovascular:     Rate and Rhythm: Normal rate.     Pulses: Normal pulses.  Pulmonary:     Effort: Pulmonary effort is normal.  Abdominal:     General: Abdomen is flat.  Musculoskeletal:     Cervical back: Normal range of motion.  Skin:    General: Skin is warm.     Capillary Refill: Capillary refill takes less than 2 seconds.  Neurological:     General: No focal deficit present.     Mental Status: She is alert.  Psychiatric:        Mood and Affect: Mood normal.  Left shoulder demonstrates functional deltoid.  Passive range of motion is 10/70/100.  Rotator cuff strength feels good with no coarse grinding or crepitus with passive range of motion.  Assessment/Plan Impression is adhesive capsulitis left shoulder with good strength on rotator cuff muscle testing.  Plan is left shoulder manipulation under anesthesia with rotator interval release.  Rotator interval release indicated due to stiffness with external rotation.  The risk and benefits are discussed including but not limited to infection nerve and vessel damage shoulder stiffness and the potential need for further surgery.  Fracture unlikely.  Rotator cuff free tearing possible but also unlikely.  Also plan to use CPM machine at least 6 hours a day for the first week after surgery.  All questions answered  Anderson Malta, MD 09/04/2021, 3:49 PM

## 2021-09-04 NOTE — Transfer of Care (Signed)
Immediate Anesthesia Transfer of Care Note  [name]  Procedure(s) Performed: LEFT SHOULDER MANIPULATION UNDER ANESTHESIA, ARTHROSCOPY WITH ROTATOR INTERVAL RELEASE (Left: Shoulder)  Patient Location: PACU  Anesthesia Type:GA combined with regional for post-op pain  Level of Consciousness: awake and patient cooperative  Airway & Oxygen Therapy: Patient Spontanous Breathing  Post-op Assessment: Report given to RN and Post -op Vital signs reviewed and stable  Post vital signs: Reviewed and stable  Last Vitals:  Vitals Value Taken Time  BP 153/99 09/04/21 1700  Temp    Pulse 83 09/04/21 1703  Resp 18 09/04/21 1703  SpO2 100 % 09/04/21 1703  Vitals shown include unvalidated device data.  Last Pain:  Vitals:   09/04/21 1305  TempSrc:   PainSc: 0-No pain      Patients Stated Pain Goal: 3 (16/43/53 9122)  Complications: No notable events documented.

## 2021-09-04 NOTE — Anesthesia Procedure Notes (Signed)
Anesthesia Regional Block: Interscalene brachial plexus block   Pre-Anesthetic Checklist: , timeout performed,  Correct Patient, Correct Site, Correct Laterality,  Correct Procedure, Correct Position, site marked,  Risks and benefits discussed,  Surgical consent,  Pre-op evaluation,  At surgeon's request and post-op pain management  Laterality: Left  Prep: chloraprep       Needles:  Injection technique: Single-shot  Needle Type: Echogenic Stimulator Needle     Needle Length: 5cm  Needle Gauge: 22     Additional Needles:   Procedures:,,,, ultrasound used (permanent image in chart),,    Narrative:  Start time: 09/04/2021 2:20 PM End time: 09/04/2021 2:30 PM Injection made incrementally with aspirations every 5 mL.  Performed by: Personally  Anesthesiologist: Catalina Gravel, MD  Additional Notes: Functioning IV was confirmed and monitors were applied.  A 13mm 22ga Arrow echogenic stimulator needle was used. Sterile prep and drape, hand hygiene, and sterile gloves were used.  Negative aspiration and negative test dose prior to incremental administration of local anesthetic. The patient tolerated the procedure well.  Ultrasound guidance: relevent anatomy identified, needle position confirmed, local anesthetic spread visualized around nerve(s), vascular puncture avoided.  Image printed for medical record.

## 2021-09-05 ENCOUNTER — Encounter (HOSPITAL_COMMUNITY): Payer: Self-pay | Admitting: Orthopedic Surgery

## 2021-09-05 ENCOUNTER — Telehealth: Payer: Self-pay | Admitting: Orthopedic Surgery

## 2021-09-05 NOTE — Telephone Encounter (Signed)
Patient called advised the goal is to be able to get her arm at 120% in 3 to 5 days on the CPM machine. Patient said her machine only goes to 90 degrees. Patient said she started at 90% today on the CPM machine. Patient asked if she need to start (PT) in order to get the 5 degrees each day after today to reach her goal of 120 degrees? Patient said she will call [employer] (PT) and get (PT) started. Patient said  the number to [facility] if needed is [phone]

## 2021-09-05 NOTE — Anesthesia Postprocedure Evaluation (Signed)
Anesthesia Post Note  [name]  Procedure(s) Performed: LEFT SHOULDER MANIPULATION UNDER ANESTHESIA, ARTHROSCOPY WITH ROTATOR INTERVAL RELEASE (Left: Shoulder)     Patient location during evaluation: PACU Anesthesia Type: General Level of consciousness: awake and alert Pain management: pain level controlled Vital Signs Assessment: post-procedure vital signs reviewed and stable Respiratory status: spontaneous breathing, nonlabored ventilation, respiratory function stable and patient connected to nasal cannula oxygen Cardiovascular status: blood pressure returned to baseline and stable Postop Assessment: no apparent nausea or vomiting Anesthetic complications: no   No notable events documented.  Last Vitals:  Vitals:   09/04/21 1715 09/04/21 1730  BP: (!) 146/85 (!) 144/87  Pulse: 76 71  Resp: 16 19  Temp:  36.7 C  SpO2: 100% 99%    Last Pain:  Vitals:   09/04/21 1730  TempSrc:   PainSc: 0-No pain                 Catalina Gravel

## 2021-09-06 DIAGNOSIS — M25512 Pain in left shoulder: Secondary | ICD-10-CM | POA: Diagnosis not present

## 2021-09-06 DIAGNOSIS — M25612 Stiffness of left shoulder, not elsewhere classified: Secondary | ICD-10-CM | POA: Diagnosis not present

## 2021-09-06 DIAGNOSIS — Z4789 Encounter for other orthopedic aftercare: Secondary | ICD-10-CM | POA: Diagnosis not present

## 2021-09-06 DIAGNOSIS — R293 Abnormal posture: Secondary | ICD-10-CM | POA: Diagnosis not present

## 2021-09-06 NOTE — Telephone Encounter (Signed)
My fault I thought the brace went to 120.  Stay at 45 but try and do HEP in addition to brace to maximize her ROM with the exercises she learned from going to PT before

## 2021-09-06 NOTE — Telephone Encounter (Signed)
Patient aware of the below message  

## 2021-09-06 NOTE — Brief Op Note (Signed)
   09/04/2021  3:31 PM  PATIENT:  Theresa Washington  40 y.o. female  PRE-OPERATIVE DIAGNOSIS:  left frozen shoulder  POST-OPERATIVE DIAGNOSIS:  left frozen shoulder  PROCEDURE:  Procedure(s): LEFT SHOULDER MANIPULATION UNDER ANESTHESIA, ARTHROSCOPY WITH ROTATOR INTERVAL RELEASE  SURGEON:  Surgeon(s): Meredith Pel, MD  ASSISTANT: magnant pa  ANESTHESIA:   epidural  EBL: 2 ml    Total I/O In: 700 [I.V.:600; IV Piggyback:100] Out: 20 [Blood:20]  BLOOD ADMINISTERED: none  DRAINS: none   LOCAL MEDICATIONS USED:  none  SPECIMEN:  No Specimen  COUNTS:  YES  TOURNIQUET:  * No tourniquets in log *  DICTATION: .Other Dictation: Dictation Number [id number]  PLAN OF CARE: Discharge to home after PACU  PATIENT DISPOSITION:  PACU - hemodynamically stable

## 2021-09-07 NOTE — Op Note (Signed)
[name], Theresa Washington MEDICAL RECORD NO: [id number] ACCOUNT NO: [id number] DATE OF BIRTH: 1980-12-02 FACILITY: [facility] LOCATION: [facility] PHYSICIAN: Yetta Barre. Marlou Sa, MD  Operative Report   DATE OF PROCEDURE: 09/04/2021  PREOPERATIVE DIAGNOSIS:  Left frozen shoulder.  POSTOPERATIVE DIAGNOSIS:  Left frozen shoulder.  PROCEDURES:  Left shoulder manipulation under anesthesia with subsequent arthroscopy and rotator interval release.  INDICATIONS:  The patient is a 40 year old patient who underwent left shoulder surgery for rotator cuff tear repair 3 months ago.  Developed frozen shoulder approximately 6 weeks ago.  He has been unable to regain adequate range of motion for activities  of daily living, presents now for operative management after explanation of risks and benefits.  DESCRIPTION OF PROCEDURE:  The patient was brought to the operating room where general anesthetic was induced.  Preoperative antibiotics administered.  Timeout was called.  Left shoulder was examined under anesthesia and found to have about 15 degrees of  external rotation on the left compared to 65 on the right.  Forward flexion was also limited to about 110 passively.  Isolated glenohumeral abduction was about 65.  The shoulder was manipulated into full forward flexion minimizing the fulcrum distance.   Isolated glenohumeral abduction was then achieved to a level of about 100 degrees.  Next, because of the patient's relatively restricted external rotation the patient was placed in the beach chair position.  Left arm, hand and shoulder pre-scrubbed with  alcohol, Betadine, and allowed to air dry.  Prepped with DuraPrep solution and draped in a sterile manner.  The posterior portal was created, anterior portal created under direct visualization.  Rotator interval was released, which allowed external  rotation to about 60 degrees.  Rotator cuff repair was intact.  Thorough irrigation of the joint was performed.  Synovitis  was present consistent with early adhesive capsulitis.  After irrigating the joint thoroughly instruments were removed.  Portals  were closed using 3-0 nylon.  The patient will do CPM machine starting on the day of surgery.  Follow up with [place] in clinic in one week.  Luke's assistance was required for opening, closing, and limb positioning.  His assistance was a medical necessity.   PUS D: 09/06/2021 3:35:07 pm T: 09/07/2021 2:14:00 am  JOB: [id number]/ [id number]

## 2021-09-11 DIAGNOSIS — M25612 Stiffness of left shoulder, not elsewhere classified: Secondary | ICD-10-CM | POA: Diagnosis not present

## 2021-09-11 DIAGNOSIS — R293 Abnormal posture: Secondary | ICD-10-CM | POA: Diagnosis not present

## 2021-09-11 DIAGNOSIS — Z4789 Encounter for other orthopedic aftercare: Secondary | ICD-10-CM | POA: Diagnosis not present

## 2021-09-11 DIAGNOSIS — M25512 Pain in left shoulder: Secondary | ICD-10-CM | POA: Diagnosis not present

## 2021-09-12 ENCOUNTER — Encounter: Payer: Self-pay | Admitting: Orthopedic Surgery

## 2021-09-12 ENCOUNTER — Other Ambulatory Visit: Payer: Self-pay

## 2021-09-12 ENCOUNTER — Ambulatory Visit (INDEPENDENT_AMBULATORY_CARE_PROVIDER_SITE_OTHER): Payer: Medicare Other | Admitting: Surgical

## 2021-09-12 DIAGNOSIS — M7502 Adhesive capsulitis of left shoulder: Secondary | ICD-10-CM

## 2021-09-12 NOTE — Progress Notes (Signed)
Post-Op Visit Note   [name]           Date of Birth: Jul 01, 1981           MRN: [id number] Visit Date: 09/12/2021 PCP: Susy Frizzle, MD   Assessment & Plan:  Chief Complaint:  Chief Complaint  Patient presents with   Other     09/04/21 (8d)   Left Shoulder Manipulation Under Anesthesia, Arthroscopy With Rotator Interval Release - Left     Visit Diagnoses:  1. Adhesive capsulitis of left shoulder     Plan: Patient is a 40 year old female who presents s/p left shoulder manipulation under anesthesia with left shoulder arthroscopy and rotator interval release on 09/04/2021.  She is doing well overall.  She is returned to physical therapy and is using CPM machine as well.  She states that pain is well controlled and her main complaint is muscle spasms but she is taking Robaxin with good relief of these.  She reports she is much more functional with her left shoulder and is able to touch her left hand to her head to do her hair as well as put her left hand behind her back much more than she was able to prior to the manipulation.  She has reached 138 degrees in physical therapy.  On examination today she has 45 degrees external rotation, 80 degrees abduction, 140 degrees forward flexion.  Incisions are healing well with sutures intact.  Sutures removed and replaced with Steri-Strips.  Well-healed incision from prior rotator cuff surgery.  5/5 motor strength of supraspinatus, infraspinatus, subscapularis.  Axillary nerve intact with deltoid firing.  Plan to continue with physical therapy to focus on active and passive range of motion and okay for rotator cuff strength exercises as well.  Follow-up in 4 weeks for clinical recheck.  Follow-Up Instructions: No follow-ups on file.   Orders:  No orders of the defined types were placed in this encounter.  No orders of the defined types were placed in this encounter.   Imaging: No results found.  PMFS  History: Patient Active Problem List   Diagnosis Date Noted   Trochanteric bursitis, left hip 08/31/2021   Bilateral knee pain 08/01/2021   Rheumatoid factor positive 07/10/2021   High risk medication use 07/10/2021   Essential hypertension 11/24/2020   Nonrheumatic mitral valve regurgitation 11/24/2020   Sacroiliac joint pain 01/12/2020   Smoker 11/30/2015   Palpitations 04/27/2014   Vasovagal syncope 04/26/2014   Spondylosis 02/24/2014   Hip pain 12/12/2013   Fibromyalgia 02/10/2013   Somatization disorder 02/10/2013   Pseudoseizures ([place]) 02/10/2013   DDD (degenerative disc disease), lumbosacral 02/10/2013   Spells 02/25/2012   History of idiopathic seizure 10/14/2011   SMOKER 05/20/2010   COPD (chronic obstructive pulmonary disease) ([place]) 05/20/2010   SHOULDER PAIN 10/27/2008   NECK PAIN 10/27/2008   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[place])    Myositis    Ocular migraine    Palpitations    yrs ago - only one episode r/t stress   PVC (premature ventricular contraction)    yrs ago - only  one episode   Scoliosis    Seizures ([place])    unknown etiolgy; no meds, last seizure was in 2014   Smoker 11/30/2015   quit 2015   Spondylosis     Family History  Problem Relation Age of Onset   Multiple sclerosis Mother    Heart disease Father    Kidney disease Father    Alcohol abuse Brother    Mental retardation Brother    Schizophrenia Brother    Other Brother        back problems   ALS Brother    Colon cancer Maternal Grandmother    Healthy Son    Healthy Son     Past Surgical History:  Procedure Laterality Date   DILATION AND CURETTAGE OF UTERUS  11/18/1996   DILITATION &  CURRETTAGE/HYSTROSCOPY WITH NOVASURE ABLATION N/A 01/16/2016   Procedure: HYSTEROSCOPY WITH NOVASURE ABLATION;  Surgeon: Jonnie Kind, MD;  Location: [facility];  Service: Gynecology;  Laterality: N/A;  Uterine Cavity Length 4.0 cm Uterine Cavity Width 3.3cm Power 73  Time 1 minute 17seconds   HIP SURGERY Left 2021   LAPAROSCOPIC BILATERAL SALPINGECTOMY Bilateral 01/16/2016   Procedure: LAPAROSCOPIC BILATERAL SALPINGECTOMY;  Surgeon: Jonnie Kind, MD;  Location: [facility];  Service: Gynecology;  Laterality: Bilateral;  procedure 1   OOPHORECTOMY  11/18/2004   Left side   SHOULDER ARTHROSCOPY Left 06/04/2021   Left shoulder arthroscopy with biceps tenodesis mini open rotator cuff tear repair   SHOULDER ARTHROSCOPY Left 09/04/2021   Procedure: LEFT SHOULDER MANIPULATION UNDER ANESTHESIA, ARTHROSCOPY WITH ROTATOR INTERVAL RELEASE;  Surgeon: Meredith Pel, MD;  Location: [facility];  Service: Orthopedics;  Laterality: Left;   Social History   Occupational History   Not on file  Tobacco Use   Smoking status: Former    Packs/day: 1.00    Years: 20.00    Pack years: 20.00    Types: Cigarettes    Quit date: 09/12/2016    Years since quitting: 5.0   Smokeless tobacco: Never   Tobacco comments:    Quit smoking cigarettes in 2015  Vaping Use   Vaping Use: Every day   Substances: Nicotine, CBD, Flavoring   Devices: [employer]  Substance and Sexual Activity   Alcohol use: Yes    Comment: socially   Drug use: Never   Sexual activity: Yes    Birth control/protection: Surgical    Comment: tubal ligation and ablation

## 2021-09-14 DIAGNOSIS — R293 Abnormal posture: Secondary | ICD-10-CM | POA: Diagnosis not present

## 2021-09-14 DIAGNOSIS — Z4789 Encounter for other orthopedic aftercare: Secondary | ICD-10-CM | POA: Diagnosis not present

## 2021-09-14 DIAGNOSIS — M25612 Stiffness of left shoulder, not elsewhere classified: Secondary | ICD-10-CM | POA: Diagnosis not present

## 2021-09-14 DIAGNOSIS — M25512 Pain in left shoulder: Secondary | ICD-10-CM | POA: Diagnosis not present

## 2021-09-15 DIAGNOSIS — M7502 Adhesive capsulitis of left shoulder: Secondary | ICD-10-CM

## 2021-09-17 ENCOUNTER — Ambulatory Visit: Payer: Medicare Other | Admitting: Orthopedic Surgery

## 2021-09-19 DIAGNOSIS — M25512 Pain in left shoulder: Secondary | ICD-10-CM | POA: Diagnosis not present

## 2021-09-19 DIAGNOSIS — M25612 Stiffness of left shoulder, not elsewhere classified: Secondary | ICD-10-CM | POA: Diagnosis not present

## 2021-09-19 DIAGNOSIS — Z4789 Encounter for other orthopedic aftercare: Secondary | ICD-10-CM | POA: Diagnosis not present

## 2021-09-19 DIAGNOSIS — R293 Abnormal posture: Secondary | ICD-10-CM | POA: Diagnosis not present

## 2021-09-25 DIAGNOSIS — M25512 Pain in left shoulder: Secondary | ICD-10-CM | POA: Diagnosis not present

## 2021-09-25 DIAGNOSIS — Z4789 Encounter for other orthopedic aftercare: Secondary | ICD-10-CM | POA: Diagnosis not present

## 2021-09-25 DIAGNOSIS — M25612 Stiffness of left shoulder, not elsewhere classified: Secondary | ICD-10-CM | POA: Diagnosis not present

## 2021-09-25 DIAGNOSIS — R293 Abnormal posture: Secondary | ICD-10-CM | POA: Diagnosis not present

## 2021-10-15 ENCOUNTER — Ambulatory Visit (INDEPENDENT_AMBULATORY_CARE_PROVIDER_SITE_OTHER): Payer: Medicare Other | Admitting: Orthopedic Surgery

## 2021-10-15 ENCOUNTER — Other Ambulatory Visit: Payer: Self-pay

## 2021-10-15 DIAGNOSIS — M7502 Adhesive capsulitis of left shoulder: Secondary | ICD-10-CM

## 2021-10-16 ENCOUNTER — Encounter: Payer: Self-pay | Admitting: Orthopedic Surgery

## 2021-10-16 NOTE — Progress Notes (Signed)
Post-Op Visit Note   [name]           Date of Birth: 23-Dec-1980           MRN: [id number] Visit Date: 10/15/2021 PCP: Susy Frizzle, MD   Assessment & Plan:  Chief Complaint:  Chief Complaint  Patient presents with   Left Shoulder - Routine Post Op    09/04/21 (5w 6d) Left Shoulder Manipulation Under Anesthesia, Arthroscopy With Rotator Interval Release      Visit Diagnoses:  1. Adhesive capsulitis of left shoulder     Plan: Theresa Washington is a 40 year old patient who presents now about 6 weeks out left shoulder manipulation under anesthesia with rotator interval release.  She was in physical therapy until last week.  She has been doing home exercise program.  She felt a pop when therapy was doing some motions.  Had some soreness after that.  Reporting some pain in the scapular region.  She states that it "does not feel lined up right".  On examination she has range of motion of 30/85/150.  No coarse grinding or crepitus with internal ex rotation at 90 degrees of AB duction.  Cuff strength is pretty reasonable.  Plan is to continue with home exercise program to focus on stretching and range of motion.  Follow-up in 6 weeks for final check.  Follow-Up Instructions: Return in about 6 weeks (around 11/26/2021).   Orders:  No orders of the defined types were placed in this encounter.  No orders of the defined types were placed in this encounter.   Imaging: No results found.  PMFS History: Patient Active Problem List   Diagnosis Date Noted   Adhesive capsulitis of left shoulder    Trochanteric bursitis, left hip 08/31/2021   Bilateral knee pain 08/01/2021   Rheumatoid factor positive 07/10/2021   High risk medication use 07/10/2021   Essential hypertension 11/24/2020   Nonrheumatic mitral valve regurgitation 11/24/2020   Sacroiliac joint pain 01/12/2020   Smoker 11/30/2015   Palpitations 04/27/2014   Vasovagal syncope 04/26/2014   Spondylosis  02/24/2014   Hip pain 12/12/2013   Fibromyalgia 02/10/2013   Somatization disorder 02/10/2013   Pseudoseizures ([place]) 02/10/2013   DDD (degenerative disc disease), lumbosacral 02/10/2013   Spells 02/25/2012   History of idiopathic seizure 10/14/2011   SMOKER 05/20/2010   COPD (chronic obstructive pulmonary disease) ([place]) 05/20/2010   SHOULDER PAIN 10/27/2008   NECK PAIN 10/27/2008   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[facility])    Myositis    Ocular migraine    Palpitations    yrs ago - only one episode r/t stress   PVC (premature ventricular contraction)    yrs ago - only one episode   Scoliosis    Seizures ([place])    unknown etiolgy; no meds, last seizure was in 2014   Smoker 11/30/2015   quit 2015   Spondylosis     Family History  Problem Relation Age of Onset   Multiple sclerosis Mother    Heart disease Father    Kidney disease Father    Alcohol abuse Brother  Mental retardation Brother    Schizophrenia Brother    Other Brother        back problems   ALS Brother    Colon cancer Maternal Grandmother    Healthy Son    Healthy Son     Past Surgical History:  Procedure Laterality Date   DILATION AND CURETTAGE OF UTERUS  11/18/1996   DILITATION & CURRETTAGE/HYSTROSCOPY WITH NOVASURE ABLATION N/A 01/16/2016   Procedure: HYSTEROSCOPY WITH NOVASURE ABLATION;  Surgeon: Jonnie Kind, MD;  Location: [facility];  Service: Gynecology;  Laterality: N/A;  Uterine Cavity Length 4.0 cm Uterine Cavity Width 3.3cm Power 73  Time 1 minute 17seconds   HIP SURGERY Left 2021   LAPAROSCOPIC BILATERAL SALPINGECTOMY Bilateral 01/16/2016   Procedure: LAPAROSCOPIC BILATERAL SALPINGECTOMY;  Surgeon: Jonnie Kind, MD;   Location: [facility];  Service: Gynecology;  Laterality: Bilateral;  procedure 1   OOPHORECTOMY  11/18/2004   Left side   SHOULDER ARTHROSCOPY Left 06/04/2021   Left shoulder arthroscopy with biceps tenodesis mini open rotator cuff tear repair   SHOULDER ARTHROSCOPY Left 09/04/2021   Procedure: LEFT SHOULDER MANIPULATION UNDER ANESTHESIA, ARTHROSCOPY WITH ROTATOR INTERVAL RELEASE;  Surgeon: Meredith Pel, MD;  Location: [facility];  Service: Orthopedics;  Laterality: Left;   Social History   Occupational History   Not on file  Tobacco Use   Smoking status: Former    Packs/day: 1.00    Years: 20.00    Pack years: 20.00    Types: Cigarettes    Quit date: 09/12/2016    Years since quitting: 5.0   Smokeless tobacco: Never   Tobacco comments:    Quit smoking cigarettes in 2015  Vaping Use   Vaping Use: Every day   Substances: Nicotine, CBD, Flavoring   Devices: [employer]  Substance and Sexual Activity   Alcohol use: Yes    Comment: socially   Drug use: Never   Sexual activity: Yes    Birth control/protection: Surgical    Comment: tubal ligation and ablation

## 2021-10-21 ENCOUNTER — Other Ambulatory Visit: Payer: Self-pay | Admitting: Family Medicine

## 2021-10-30 NOTE — Progress Notes (Signed)
Office Visit Note  [name]             Date of Birth: 03/24/1981           MRN: [id number]             PCP: Susy Frizzle, MD Referring: Susy Frizzle, MD Visit Date: 10/31/2021   Subjective:  Follow-up (Bil hip and left shoulder pain and swelling, eye inflammation)   History of Present Illness: Theresa Washington is a 40 y.o. female here for follow up with multiple joint pains and positive RF Abs most recent visit with lateral hip pain increase treated with a repeat trial of prednisone taper.  Symptoms improved again very well but return pretty quickly after discontinuing steroids.  She has left shoulder adhesive capsulitis treatment manipulation under anesthesia with Dr. Marlou Sa.  Despite this subsequent work with physical therapy they have expressed concern for abnormal incomplete healing still with limited range of motion in the left shoulder.  She saw ophthalmology with abnormal findings exam with retina specialist report reviewed identifying retina schisis in the right eye, bilateral dry eyes on artificial tears and Restasis, and findings consistent with atypical ocular migraine.  Previous HPI 08/31/21 Theresa Washington is a 40 y.o. female here with increased bilateral left worse than right hip pain this is ongoing for the past 5 days most severe with lying on her side or direct pressure, also some pain with standing and sitting motion. These symptoms feel similar to previous episodes of lateral hip pain she has had past treatments with local injection or oral steroids for trochanteric bursitis. She saw Dr. Marlou Sa earlier today for left shoulder pain and decreased ROM appears to be adhesive capsulitis after her arthroscopy and being scheduled for manipulation under anesthesia for treatment. Inflammatory serology was checked again by Dr. Dennard Schaumann on 10/3 was negative  Previous HPI 07/10/21 Theresa Washington is a 40 y.o. female here for chronic joint of multiple  sites with positive rheumatoid factor. She has history of left shoulder arthroscopy for rotator cuff tear and let hip arthroscopy for labral tear. Joint pains are mostly chronic worst in the shoulders, elbows, hips, and knees bilaterally. These were evaluated years ago with negative serology and attributed to her injury changes and fibromyalgia syndrome. She previously developed high increases in inflammatory markers despite negative antibody tests. Pain has gotten worse some in the shoulders and hips treated with orthopedics but now also more pain and swelling in elbows. She is stiff for more than an hour in mornings but also has worsening pain with heavier use. She gets the most relief from prednisone and has taken intermittent short tapering doses with good relief but when recurrence afterwards. Individuals flares or episodes lasting about 7-10 days typically. Most recent treatment finished 2 days ago.   Labs reviewed 06/2021 RF 50 ANA neg ESR 11 CRP neg CBC wnl   Review of Systems  Constitutional:  Positive for fatigue.  HENT:  Positive for mouth dryness.   Eyes:  Positive for visual disturbance and dryness.  Respiratory:  Negative for shortness of breath.   Cardiovascular:  Negative for swelling in legs/feet.  Gastrointestinal:  Negative for constipation.  Endocrine: Positive for heat intolerance.  Genitourinary:  Negative for difficulty urinating.  Musculoskeletal:  Positive for joint pain, joint pain, joint swelling, muscle weakness, morning stiffness and muscle tenderness.  Skin:  Negative for rash.  Allergic/Immunologic: Negative for susceptible to infections.  Neurological:  Positive for weakness.  Hematological:  Positive for bruising/bleeding tendency.  Psychiatric/Behavioral:  Positive for sleep disturbance.    PMFS History:  Patient Active Problem List   Diagnosis Date Noted   Adhesive capsulitis of left shoulder    Trochanteric bursitis, left hip 08/31/2021   Bilateral  knee pain 08/01/2021   Rheumatoid arthritis with rheumatoid factor of multiple sites without organ or systems involvement ([place]) 07/10/2021   High risk medication use 07/10/2021   Essential hypertension 11/24/2020   Nonrheumatic mitral valve regurgitation 11/24/2020   Sacroiliac joint pain 01/12/2020   Smoker 11/30/2015   Palpitations 04/27/2014   Vasovagal syncope 04/26/2014   Spondylosis 02/24/2014   Hip pain 12/12/2013   Fibromyalgia 02/10/2013   Somatization disorder 02/10/2013   Pseudoseizures ([place]) 02/10/2013   DDD (degenerative disc disease), lumbosacral 02/10/2013   Spells 02/25/2012   History of idiopathic seizure 10/14/2011   SMOKER 05/20/2010   COPD (chronic obstructive pulmonary disease) ([place]) 05/20/2010   SHOULDER PAIN 10/27/2008   NECK PAIN 10/27/2008    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[place])    Myositis    Ocular migraine    Palpitations    yrs ago - only one episode r/t stress   PVC (premature ventricular contraction)    yrs ago - only one episode   Scoliosis    Seizures ([place])    unknown etiolgy; no meds, last seizure was in 2014   Smoker 11/30/2015   quit 2015   Spondylosis     Family History  Problem Relation Age of Onset   Multiple sclerosis Mother    Heart disease Father    Kidney disease Father    Alcohol abuse Brother    Mental retardation Brother    Schizophrenia Brother    Other Brother        back problems   ALS Brother    Colon cancer Maternal Grandmother    Healthy Son    Healthy Son    Past Surgical History:  Procedure Laterality Date   DILATION AND CURETTAGE OF UTERUS  11/18/1996   DILITATION & CURRETTAGE/HYSTROSCOPY WITH NOVASURE ABLATION N/A 01/16/2016    Procedure: HYSTEROSCOPY WITH NOVASURE ABLATION;  Surgeon: Jonnie Kind, MD;  Location: [facility];  Service: Gynecology;  Laterality: N/A;  Uterine Cavity Length 4.0 cm Uterine Cavity Width 3.3cm Power 73  Time 1 minute 17seconds   HIP SURGERY Left 2021   LAPAROSCOPIC BILATERAL SALPINGECTOMY Bilateral 01/16/2016   Procedure: LAPAROSCOPIC BILATERAL SALPINGECTOMY;  Surgeon: Jonnie Kind, MD;  Location: [facility];  Service: Gynecology;  Laterality: Bilateral;  procedure 1   OOPHORECTOMY  11/18/2004   Left side   SHOULDER ARTHROSCOPY Left 06/04/2021   Left shoulder arthroscopy with biceps tenodesis mini open rotator cuff tear repair   SHOULDER ARTHROSCOPY Left 09/04/2021   Procedure: LEFT SHOULDER MANIPULATION UNDER ANESTHESIA, ARTHROSCOPY WITH ROTATOR INTERVAL RELEASE;  Surgeon: Meredith Pel, MD;  Location: [facility];  Service: Orthopedics;  Laterality: Left;   Social History   Social History Narrative   Not on file   Immunization History  Administered Date(s) Administered   Influenza Whole 07/19/2012   Influenza,inj,Quad PF,6+  Mos 09/15/2015, 10/17/2017   Influenza-Unspecified 08/30/2011   Moderna Sars-Covid-2 Vaccination 08/26/2020, 09/23/2020   Tdap 03/01/2011     Objective: Vital Signs: BP 120/70 (BP Location: Left Arm, Patient Position: Sitting, Cuff Size: Normal)    Pulse 68    Resp 14    Ht _0  (1.549 m)    Wt 115 lb (52.2 kg)    BMI 21.73 kg/m    Physical Exam Cardiovascular:     Rate and Rhythm: Normal rate and regular rhythm.  Pulmonary:     Effort: Pulmonary effort is normal.     Breath sounds: Normal breath sounds.  Musculoskeletal:     Right lower leg: No edema.     Left lower leg: No edema.  Skin:    General: Skin is warm and dry.     Comments: Livedo reticularis prominent on arms no lesions no digital pitting  Neurological:     Mental Status: She is alert.  Psychiatric:        Mood and Affect: Mood normal.     Musculoskeletal Exam:  Neck full ROM no  tenderness Left shoulder decreased external rotation ROM, left scapula in slightly anterior position Elbows full ROM no palpable swelling, tenderness to pressure worst around medial epicondyle, left elbow erythema Wrists full ROM no tenderness or swelling Fingers full ROM no tenderness or swelling Diffuse muscle tenderness in upper back, palpable muscle bundles in thoracic area Hip tenderness posterior to greater trochanter to low sacroiliac area, right hip worse FADIR ROM Knees full ROM no tenderness or swelling Ankles full ROM no tenderness or swelling   CDAI Exam: CDAI Score: 8  Patient Global: 30 mm; Provider Global: 20 mm Swollen: 0 ; Tender: 3  Joint Exam 10/31/2021      Right  Left  Glenohumeral      Tender  Elbow   Tender   Tender     Investigation: No additional findings.  Imaging: No results found.  Recent Labs: Lab Results  Component Value Date   WBC 7.1 09/04/2021   HGB 13.7 09/04/2021   PLT 377 09/04/2021   NA 137 09/04/2021   K 4.2 09/04/2021   CL 107 09/04/2021   CO2 21 (L) 09/04/2021   GLUCOSE 117 (H) 09/04/2021   BUN 8 09/04/2021   CREATININE 0.85 09/04/2021   BILITOT 0.4 07/10/2021   ALKPHOS 80 09/01/2015   AST 13 07/10/2021   ALT 13 07/10/2021   PROT 6.8 07/10/2021   ALBUMIN 4.0 09/01/2015   CALCIUM 9.3 09/04/2021   GFRAA 106 10/24/2020   QFTBGOLDPLUS NEGATIVE 07/10/2021    Speciality Comments: No specialty comments available.  Procedures:  No procedures performed Allergies: Shrimp [shellfish allergy] and Other   Assessment / Plan:     Visit Diagnoses: Rheumatoid arthritis with rheumatoid factor of multiple sites without organ or systems involvement ([place]) - Plan: Sedimentation rate, methotrexate (RHEUMATREX) 2.5 MG tablet, folic acid (FOLVITE) 1 MG tablet  No specific joint synovitis again on exam although left elbow erythema is present as well as tenderness.  Combination of symptoms with eye inflammation, shoulder adhesive capsulitis,  recurrent inflammatory type joint pain and extensive livedo rashes and Raynaud's type symptoms post rheumatoid factor.  Recommend starting methotrexate 15 mg p.o. weekly with folic acid 1 mg daily.  Discussed risks of medication including cytopenias liver toxicity or ulcers or pneumonitis.  Sacroiliac joint pain  Pain in the posterior sacrum and pelvic area or around piriformis currently doing better compared to previous trochanteric bursitis at  last visit.  Adhesive capsulitis of left shoulder  Shoulder external rotation range of motion is slightly diminished on exam but also some component of anterior scapula position on the left side.  Ongoing work with physical therapy for this problem.  Underlying inflammatory arthritis could be secondary cause for adhesive capsulitis or delayed recovery.  High risk medication use - Plan: CBC with Differential/Platelet, COMPLETE METABOLIC PANEL WITH GFR  Planning to start methotrexate for RA DMARD treatment checking CBC and CMP today for medication monitoring.  Orders: Orders Placed This Encounter  Procedures   Sedimentation rate   CBC with Differential/Platelet   COMPLETE METABOLIC PANEL WITH GFR   Meds ordered this encounter  Medications   methotrexate (RHEUMATREX) 2.5 MG tablet    Sig: Take 6 tablets (15 mg total) by mouth once a week. Caution:Chemotherapy. Protect from light.    Dispense:  30 tablet    Refill:  1   folic acid (FOLVITE) 1 MG tablet    Sig: Take 1 tablet (1 mg total) by mouth daily.    Dispense:  90 tablet    Refill:  0     Follow-Up Instructions: Return in about 2 months (around 01/01/2022) for FMS/OA/RA MTX start.   Collier Salina, MD  Note - This record has been created using [employer].  Chart creation errors have been sought, but may not always  have been located. Such creation errors do not reflect on  the standard of medical care.

## 2021-10-31 ENCOUNTER — Other Ambulatory Visit: Payer: Self-pay | Admitting: Internal Medicine

## 2021-10-31 ENCOUNTER — Ambulatory Visit (INDEPENDENT_AMBULATORY_CARE_PROVIDER_SITE_OTHER): Payer: Medicare Other | Admitting: Internal Medicine

## 2021-10-31 ENCOUNTER — Other Ambulatory Visit: Payer: Self-pay

## 2021-10-31 ENCOUNTER — Encounter: Payer: Self-pay | Admitting: Internal Medicine

## 2021-10-31 VITALS — BP 120/70 | HR 68 | Resp 14 | Ht 61.0 in | Wt 115.0 lb

## 2021-10-31 DIAGNOSIS — M0579 Rheumatoid arthritis with rheumatoid factor of multiple sites without organ or systems involvement: Secondary | ICD-10-CM

## 2021-10-31 DIAGNOSIS — M533 Sacrococcygeal disorders, not elsewhere classified: Secondary | ICD-10-CM | POA: Diagnosis not present

## 2021-10-31 DIAGNOSIS — Z79899 Other long term (current) drug therapy: Secondary | ICD-10-CM | POA: Diagnosis not present

## 2021-10-31 DIAGNOSIS — M7502 Adhesive capsulitis of left shoulder: Secondary | ICD-10-CM

## 2021-10-31 DIAGNOSIS — M797 Fibromyalgia: Secondary | ICD-10-CM | POA: Diagnosis not present

## 2021-10-31 MED ORDER — METHOTREXATE 2.5 MG PO TABS: 15.0000 mg | ORAL_TABLET | ORAL | 1 refills | Status: DC

## 2021-10-31 MED ORDER — FOLIC ACID 1 MG PO TABS: 1.0000 mg | ORAL_TABLET | Freq: Every day | ORAL | 0 refills | Status: DC

## 2021-10-31 NOTE — Patient Instructions (Signed)
Methotrexate Tablets What is this medication? METHOTREXATE (METH oh TREX ate) treats inflammatory conditions such as arthritis and psoriasis. It works by decreasing inflammation, which can reduce pain and prevent long-term injury to the joints and skin. It may also be used to treat some types of cancer. It works by slowing down the growth of cancer cells. This medicine may be used for other purposes; ask your health care provider or pharmacist if you have questions. COMMON BRAND NAME(S): Rheumatrex, Trexall What should I tell my care team before I take this medication? They need to know if you have any of these conditions: Fluid in the stomach area or lungs If you often drink alcohol Infection or immune system problems Kidney disease or on hemodialysis Liver disease Low blood counts, like low white cell, platelet, or red cell counts Lung disease Radiation therapy Stomach ulcers Ulcerative colitis An unusual or allergic reaction to methotrexate, other medications, foods, dyes, or preservatives Pregnant or trying to get pregnant Breast-feeding How should I use this medication? Take this medication by mouth with a glass of water. Follow the directions on the prescription label. Take your medication at regular intervals. Do not take it more often than directed. Do not stop taking except on your care team's advice. Make sure you know why you are taking this medication and how often you should take it. If this medication is used for a condition that is not cancer, like arthritis or psoriasis, it should be taken weekly, NOT daily. Taking this medication more often than directed can cause serious side effects, even death. Talk to your care team about safe handling and disposal of this medication. You may need to take special precautions. Talk to your care team about the use of this medication in children. While this medication may be prescribed for selected conditions, precautions do  apply. Overdosage: If you think you have taken too much of this medicine contact a poison control center or emergency room at once. NOTE: This medicine is only for you. Do not share this medicine with others. What if I miss a dose? If you miss a dose, talk with your care team. Do not take double or extra doses. What may interact with this medication? Do not take this medication with any of the following: Acitretin This medication may also interact with the following: Aspirin and aspirin-like medications including salicylates Azathioprine Certain antibiotics like penicillins, tetracycline, and chloramphenicol Certain medications that treat or prevent blood clots like warfarin, apixaban, dabigatran, and rivaroxaban Certain medications for stomach problems like esomeprazole, omeprazole, pantoprazole Cyclosporine Dapsone Diuretics Gold Hydroxychloroquine Live virus vaccines Medications for infection like acyclovir, adefovir, amphotericin B, bacitracin, cidofovir, foscarnet, ganciclovir, gentamicin, pentamidine, vancomycin Mercaptopurine NSAIDs, medications for pain and inflammation, like ibuprofen or naproxen Other cytotoxic agents Pamidronate Pemetrexed Penicillamine Phenylbutazone Phenytoin Probenecid Pyrimethamine Retinoids such as isotretinoin and tretinoin Steroid medications like prednisone or cortisone Sulfonamides like sulfasalazine and trimethoprim/sulfamethoxazole Theophylline Zoledronic acid This list may not describe all possible interactions. Give your health care provider a list of all the medicines, herbs, non-prescription drugs, or dietary supplements you use. Also tell them if you smoke, drink alcohol, or use illegal drugs. Some items may interact with your medicine. What should I watch for while using this medication? Avoid alcoholic drinks. This medication can make you more sensitive to the sun. Keep out of the sun. If you cannot avoid being in the sun, wear  protective clothing and use sunscreen. Do not use sun lamps or tanning beds/booths. You may need   blood work done while you are taking this medication. Call your care team for advice if you get a fever, chills or sore throat, or other symptoms of a cold or flu. Do not treat yourself. This medication decreases your body's ability to fight infections. Try to avoid being around people who are sick. This medication may increase your risk to bruise or bleed. Call your care team if you notice any unusual bleeding. Be careful brushing or flossing your teeth or using a toothpick because you may get an infection or bleed more easily. If you have any dental work done, tell your dentist you are receiving this medication. Check with your care team if you get an attack of severe diarrhea, nausea and vomiting, or if you sweat a lot. The loss of too much body fluid can make it dangerous for you to take this medication. Talk to your care team about your risk of cancer. You may be more at risk for certain types of cancers if you take this medication. Do not become pregnant while taking this medication or for 6 months after stopping it. Women should inform their care team if they wish to become pregnant or think they might be pregnant. Men should not father a child while taking this medication and for 3 months after stopping it. There is potential for serious harm to an unborn child. Talk to your care team for more information. Do not breast-feed an infant while taking this medication or for 1 week after stopping it. This medication may make it more difficult to get pregnant or father a child. Talk to your care team if you are concerned about your fertility. What side effects may I notice from receiving this medication? Side effects that you should report to your care team as soon as possible: Allergic reactions--skin rash, itching, hives, swelling of the face, lips, tongue, or throat Blood clot--pain, swelling, or warmth  in the leg, shortness of breath, chest pain Dry cough, shortness of breath or trouble breathing Infection--fever, chills, cough, sore throat, wounds that don't heal, pain or trouble when passing urine, general feeling of discomfort or being unwell Kidney injury--decrease in the amount of urine, swelling of the ankles, hands, or feet Liver injury--right upper belly pain, loss of appetite, nausea, light-colored stool, dark yellow or brown urine, yellowing of the skin or eyes, unusual weakness or fatigue Low red blood cell count--unusual weakness or fatigue, dizziness, headache, trouble breathing Redness, blistering, peeling, or loosening of the skin, including inside the mouth Seizures Unusual bruising or bleeding Side effects that usually do not require medical attention (report to your care team if they continue or are bothersome): Diarrhea Dizziness Hair loss Nausea Pain, redness, or swelling with sores inside the mouth or throat Vomiting This list may not describe all possible side effects. Call your doctor for medical advice about side effects. You may report side effects to FDA at 1-800-FDA-1088. Where should I keep my medication? Keep out of the reach of children and pets. Store at room temperature between 20 and 25 degrees C (68 and 77 degrees F). Protect from light. Get rid of any unused medication after the expiration date. Talk to your care team about how to dispose of unused medication. Special directions may apply. NOTE: This sheet is a summary. It may not cover all possible information. If you have questions about this medicine, talk to your doctor, pharmacist, or health care provider.  2022 Elsevier/Gold Standard (2021-01-08 00:00:00)  

## 2021-11-01 ENCOUNTER — Telehealth: Payer: Self-pay | Admitting: *Deleted

## 2021-11-01 DIAGNOSIS — Z79899 Other long term (current) drug therapy: Secondary | ICD-10-CM

## 2021-11-01 LAB — COMPLETE METABOLIC PANEL WITH GFR
AG Ratio: 1.6 (calc) (ref 1.0–2.5)
ALT: 13 U/L (ref 6–29)
AST: 14 U/L (ref 10–30)
Albumin: 4.4 g/dL (ref 3.6–5.1)
Alkaline phosphatase (APISO): 50 U/L (ref 31–125)
BUN: 9 mg/dL (ref 7–25)
CO2: 23 mmol/L (ref 20–32)
Calcium: 9.3 mg/dL (ref 8.6–10.2)
Chloride: 106 mmol/L (ref 98–110)
Creat: 0.74 mg/dL (ref 0.50–0.99)
Globulin: 2.7 g/dL (calc) (ref 1.9–3.7)
Glucose, Bld: 91 mg/dL (ref 65–99)
Potassium: 4.4 mmol/L (ref 3.5–5.3)
Sodium: 138 mmol/L (ref 135–146)
Total Bilirubin: 0.3 mg/dL (ref 0.2–1.2)
Total Protein: 7.1 g/dL (ref 6.1–8.1)
eGFR: 105 mL/min/{1.73_m2} (ref >=60)

## 2021-11-01 LAB — CBC WITH DIFFERENTIAL/PLATELET
Absolute Monocytes: 727 cells/uL (ref 200–950)
Basophils Absolute: 43 cells/uL (ref 0–200)
Basophils Relative: 0.6 %
Eosinophils Absolute: 29 cells/uL (ref 15–500)
Eosinophils Relative: 0.4 %
HCT: 39.1 % (ref 35.0–45.0)
Hemoglobin: 12.9 g/dL (ref 11.7–15.5)
Lymphs Abs: 1591 cells/uL (ref 850–3900)
MCH: 30.2 pg (ref 27.0–33.0)
MCHC: 33 g/dL (ref 32.0–36.0)
MCV: 91.6 fL (ref 80.0–100.0)
MPV: 10.2 fL (ref 7.5–12.5)
Monocytes Relative: 10.1 %
Neutro Abs: 4810 cells/uL (ref 1500–7800)
Neutrophils Relative %: 66.8 %
Platelets: 342 10*3/uL (ref 140–400)
RBC: 4.27 10*6/uL (ref 3.80–5.10)
RDW: 12 % (ref 11.0–15.0)
Total Lymphocyte: 22.1 %
WBC: 7.2 10*3/uL (ref 3.8–10.8)

## 2021-11-01 LAB — SEDIMENTATION RATE: Sed Rate: 6 mm/h (ref 0–20)

## 2021-11-01 NOTE — Progress Notes (Signed)
Lab results are normal for blood count, metabolic panel so no problems to start methotrexate as planned. Prescription sent to pharmacy for 1 month. I recommend she have labs rechecked in 2-4 weeks later for monitoring.

## 2021-11-01 NOTE — Telephone Encounter (Signed)
Patient contacted the office stating she was prescribed MTX at her appointment yesterday. Patient states she was advised to discontinue her Celebrex. She wanted to make sure that it is not a problem that she took her morning dose yesterday but did not take her afternoon dose. Patient advised that was okay, she would just need to discontinue it from yesterday afternoon forward. Patient also wanted to verify if she is supposed to take the MTX 6 tabs all at one time and if it should be the same day each week. Confirmed with patient that is how she should take it.

## 2021-11-01 NOTE — Telephone Encounter (Signed)
-----   Message from Collier Salina, MD sent at 11/01/2021  7:59 AM EST ----- Lab results are normal for blood count, metabolic panel so no problems to start methotrexate as planned. Prescription sent to pharmacy for 1 month. I recommend she have labs rechecked in 2-4 weeks later for monitoring.

## 2021-11-13 ENCOUNTER — Other Ambulatory Visit: Payer: Self-pay | Admitting: *Deleted

## 2021-11-13 DIAGNOSIS — Z79899 Other long term (current) drug therapy: Secondary | ICD-10-CM

## 2021-11-14 LAB — CBC WITH DIFFERENTIAL/PLATELET
Absolute Monocytes: 653 cells/uL (ref 200–950)
Basophils Absolute: 64 cells/uL (ref 0–200)
Basophils Relative: 0.7 %
Eosinophils Absolute: 83 cells/uL (ref 15–500)
Eosinophils Relative: 0.9 %
HCT: 39 % (ref 35.0–45.0)
Hemoglobin: 13.1 g/dL (ref 11.7–15.5)
Lymphs Abs: 2438 cells/uL (ref 850–3900)
MCH: 30.5 pg (ref 27.0–33.0)
MCHC: 33.6 g/dL (ref 32.0–36.0)
MCV: 90.9 fL (ref 80.0–100.0)
MPV: 9.6 fL (ref 7.5–12.5)
Monocytes Relative: 7.1 %
Neutro Abs: 5962 cells/uL (ref 1500–7800)
Neutrophils Relative %: 64.8 %
Platelets: 375 10*3/uL (ref 140–400)
RBC: 4.29 10*6/uL (ref 3.80–5.10)
RDW: 12.2 % (ref 11.0–15.0)
Total Lymphocyte: 26.5 %
WBC: 9.2 10*3/uL (ref 3.8–10.8)

## 2021-11-14 LAB — COMPLETE METABOLIC PANEL WITH GFR
AG Ratio: 1.8 (calc) (ref 1.0–2.5)
ALT: 8 U/L (ref 6–29)
AST: 11 U/L (ref 10–30)
Albumin: 4.4 g/dL (ref 3.6–5.1)
Alkaline phosphatase (APISO): 51 U/L (ref 31–125)
BUN/Creatinine Ratio: 14 (calc) (ref 6–22)
BUN: 14 mg/dL (ref 7–25)
CO2: 24 mmol/L (ref 20–32)
Calcium: 9.1 mg/dL (ref 8.6–10.2)
Chloride: 107 mmol/L (ref 98–110)
Creat: 1 mg/dL — ABNORMAL HIGH (ref 0.50–0.99)
Globulin: 2.5 g/dL (calc) (ref 1.9–3.7)
Glucose, Bld: 72 mg/dL (ref 65–99)
Potassium: 4.3 mmol/L (ref 3.5–5.3)
Sodium: 138 mmol/L (ref 135–146)
Total Bilirubin: 0.2 mg/dL (ref 0.2–1.2)
Total Protein: 6.9 g/dL (ref 6.1–8.1)
eGFR: 73 mL/min/{1.73_m2} (ref >=60)

## 2021-11-20 ENCOUNTER — Telehealth: Payer: Self-pay | Admitting: *Deleted

## 2021-11-20 NOTE — Telephone Encounter (Signed)
Patient contacted the office stating she has been on Methotrexate for about 3 weeks. Patient states she has noticed some nausea when taking the MTX. Patient states she has developed a rash around the nape of her neck, in her hairline under her chin and around and below her ears. Patient states it follows her jaw line. Patient states the rash is also down her spine. She states this reminds her of a rash she has had before. She states it appeared off and on when she was having a flare. Patient states she has used Clobetasol in the past which helped. Please advise.

## 2021-11-21 ENCOUNTER — Ambulatory Visit (INDEPENDENT_AMBULATORY_CARE_PROVIDER_SITE_OTHER): Payer: Medicare Other | Admitting: Orthopedic Surgery

## 2021-11-21 ENCOUNTER — Telehealth: Payer: Self-pay

## 2021-11-21 ENCOUNTER — Other Ambulatory Visit: Payer: Self-pay

## 2021-11-21 ENCOUNTER — Encounter: Payer: Self-pay | Admitting: Orthopedic Surgery

## 2021-11-21 DIAGNOSIS — M7502 Adhesive capsulitis of left shoulder: Secondary | ICD-10-CM

## 2021-11-21 DIAGNOSIS — R21 Rash and other nonspecific skin eruption: Secondary | ICD-10-CM

## 2021-11-21 MED ORDER — CLOBETASOL PROPIONATE 0.05 % EX CREA: 1.0000 "application " | TOPICAL_CREAM | Freq: Two times a day (BID) | CUTANEOUS | 0 refills | Status: DC | PRN

## 2021-11-21 NOTE — Telephone Encounter (Signed)
I spoke with Theresa Washington, rash sounds similar to past symptoms she has had before. This does not sound typical for a methotrexate related effect. She has no other new systemic complaint. I recommend trying treatment initially with topical clobetasol 0.05% up to twice daily as needed. Discussed avoiding the face with high potency steroid. If symptoms not improving within about 5-7 days she should call back again.

## 2021-11-21 NOTE — Progress Notes (Signed)
Post-Op Visit Note   [name]           Date of Birth: 1980/11/28           MRN: [id number] Visit Date: 11/21/2021 PCP: Theresa Frizzle, MD   Assessment & Plan:  Chief Complaint:  Chief Complaint  Patient presents with   Left Shoulder - Follow-up    09/04/21 (11w 1d) Left Shoulder Manipulation Under Anesthesia, Arthroscopy With Rotator Interval Release     Visit Diagnoses: No diagnosis found.  Plan: Gabriellia is now 2-1/2 months out left shoulder manipulation under anesthesia with rotator interval release.  Doing well overall.  Doing physical therapy at home.  RA factor just increased.  She has been on medication for that for the past 3 weeks.  On examination she has range of motion on the left of 45/100/170.  Rotator cuff strength is good.  Overall she has improved.  Cautioned her against really doing too much activity with that left shoulder in terms of heavy lifting.  Would prefer that she use the right shoulder and arm for the heavier lifting aspects of her life.  She will continue to keep the shoulder limbered up.  Follow-up as needed.  Follow-Up Instructions: No follow-ups on file.   Orders:  No orders of the defined types were placed in this encounter.  No orders of the defined types were placed in this encounter.   Imaging: No results found.  PMFS History: Patient Active Problem List   Diagnosis Date Noted   Adhesive capsulitis of left shoulder    Trochanteric bursitis, left hip 08/31/2021   Bilateral knee pain 08/01/2021   Rheumatoid arthritis with rheumatoid factor of multiple sites without organ or systems involvement ([place]) 07/10/2021   High risk medication use 07/10/2021   Essential hypertension 11/24/2020   Nonrheumatic mitral valve regurgitation 11/24/2020   Sacroiliac joint pain 01/12/2020   Smoker 11/30/2015   Palpitations 04/27/2014   Vasovagal syncope 04/26/2014   Spondylosis 02/24/2014   Hip pain 12/12/2013   Fibromyalgia  02/10/2013   Somatization disorder 02/10/2013   Pseudoseizures ([place]) 02/10/2013   DDD (degenerative disc disease), lumbosacral 02/10/2013   Spells 02/25/2012   History of idiopathic seizure 10/14/2011   SMOKER 05/20/2010   COPD (chronic obstructive pulmonary disease) ([place]) 05/20/2010   SHOULDER PAIN 10/27/2008   NECK PAIN 10/27/2008   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[place])    Myositis    Ocular migraine    Palpitations    yrs ago - only one episode r/t stress   PVC (premature ventricular contraction)    yrs ago - only one episode   Scoliosis    Seizures ([place])    unknown etiolgy; no meds, last seizure was in 2014   Smoker 11/30/2015   quit 2015   Spondylosis     Family History  Problem Relation Age of Onset   Multiple sclerosis Mother    Heart disease Father    Kidney disease Father    Alcohol abuse Brother    Mental retardation Brother    Schizophrenia Brother    Other Brother  back problems   ALS Brother    Colon cancer Maternal Grandmother    Healthy Son    Healthy Son     Past Surgical History:  Procedure Laterality Date   DILATION AND CURETTAGE OF UTERUS  11/18/1996   DILITATION & CURRETTAGE/HYSTROSCOPY WITH NOVASURE ABLATION N/A 01/16/2016   Procedure: HYSTEROSCOPY WITH NOVASURE ABLATION;  Surgeon: Jonnie Kind, MD;  Location: [facility];  Service: Gynecology;  Laterality: N/A;  Uterine Cavity Length 4.0 cm Uterine Cavity Width 3.3cm Power 73  Time 1 minute 17seconds   HIP SURGERY Left 2021   LAPAROSCOPIC BILATERAL SALPINGECTOMY Bilateral 01/16/2016   Procedure: LAPAROSCOPIC BILATERAL SALPINGECTOMY;  Surgeon: Jonnie Kind, MD;  Location: [facility];  Service: Gynecology;   Laterality: Bilateral;  procedure 1   OOPHORECTOMY  11/18/2004   Left side   SHOULDER ARTHROSCOPY Left 06/04/2021   Left shoulder arthroscopy with biceps tenodesis mini open rotator cuff tear repair   SHOULDER ARTHROSCOPY Left 09/04/2021   Procedure: LEFT SHOULDER MANIPULATION UNDER ANESTHESIA, ARTHROSCOPY WITH ROTATOR INTERVAL RELEASE;  Surgeon: Meredith Pel, MD;  Location: [facility];  Service: Orthopedics;  Laterality: Left;   Social History   Occupational History   Not on file  Tobacco Use   Smoking status: Former    Packs/day: 1.00    Years: 20.00    Pack years: 20.00    Types: Cigarettes    Quit date: 09/12/2016    Years since quitting: 5.1   Smokeless tobacco: Never   Tobacco comments:    Quit smoking cigarettes in 2015  Vaping Use   Vaping Use: Every day   Substances: Nicotine, CBD, Flavoring   Devices: [employer]  Substance and Sexual Activity   Alcohol use: Yes    Comment: socially   Drug use: Never   Sexual activity: Yes    Birth control/protection: Surgical    Comment: tubal ligation and ablation

## 2021-11-21 NOTE — Progress Notes (Signed)
Lab results are fine no problems after starting methotrexate and no change needed at this time.

## 2021-11-21 NOTE — Telephone Encounter (Signed)
Patient stopped by the office to check if Dr. Benjamine Mola received the message about her rash.  Patient states she has a car this morning and wanted to see if Dr. Benjamine Mola needed to see the rash.  Patient states she could wait.  I checked with Seth Bake and patient was offered 11:20 am.  Patient was unable to take that appointment due to her father needing the car for an appointment at 12:00 with the cardiologist.  Patient states she is available tomorrow, 1/5 or Friday 11/23/20.  Please advise.

## 2021-11-26 ENCOUNTER — Ambulatory Visit: Payer: Medicare Other | Admitting: Internal Medicine

## 2021-11-26 ENCOUNTER — Telehealth: Payer: Self-pay | Admitting: Rheumatology

## 2021-11-26 DIAGNOSIS — R21 Rash and other nonspecific skin eruption: Secondary | ICD-10-CM

## 2021-11-26 DIAGNOSIS — M0579 Rheumatoid arthritis with rheumatoid factor of multiple sites without organ or systems involvement: Secondary | ICD-10-CM

## 2021-11-26 NOTE — Telephone Encounter (Signed)
Patient called the office stating she is starting to have a flare up on bilateral hips. Patient made an appointment to be seen today at 3 but the car she uses has a flat tire so she had to cancel. Patient states she was told to contact the office if she has a flare up. Patient would like advise on what to do.

## 2021-11-29 NOTE — Telephone Encounter (Signed)
Left voicemail attempted return call. Not sure how severe is this hip pain is she able to take muscle relaxant medication or does she feel like this needs more aggressive treatment with steroid or pain medication? Is her skin rash improving at all with the topical clobetasol or is this getting worse too?

## 2021-11-30 DIAGNOSIS — R21 Rash and other nonspecific skin eruption: Secondary | ICD-10-CM | POA: Insufficient documentation

## 2021-11-30 MED ORDER — METHYLPREDNISOLONE 4 MG PO TBPK: ORAL_TABLET | ORAL | 0 refills | Status: DC

## 2021-11-30 NOTE — Telephone Encounter (Signed)
Rx sent for medrol dosepack she can take for rash and joint pain if still giving her a lot of trouble it should be more potent than using topical steroids. This is a steroid so I recommend not taking any celebrex or any OTC antiinflammatory medicines while on this.

## 2021-11-30 NOTE — Telephone Encounter (Signed)
Advised patient that per Dr. Benjamine Mola, Rx sent for medrol dosepack she can take for rash and joint pain if still giving her a lot of trouble it should be more potent than using topical steroids. This is a steroid so I recommend not taking any celebrex or any OTC antiinflammatory medicines while on this. Patient verbalized understanding.

## 2021-11-30 NOTE — Addendum Note (Signed)
Addended by: Collier Salina on: 11/30/2021 01:52 PM   Modules accepted: Orders

## 2021-12-05 ENCOUNTER — Telehealth: Payer: Self-pay

## 2021-12-05 NOTE — Telephone Encounter (Signed)
Patient left voicemail stating she started a new medication and is not sure when she is due for labwork.  Patient requested a return call.

## 2021-12-05 NOTE — Telephone Encounter (Signed)
Patient advised she is not due for labs at this time. Patient advised she will be due in March 2023.

## 2021-12-14 ENCOUNTER — Telehealth: Payer: Self-pay | Admitting: Internal Medicine

## 2021-12-14 NOTE — Telephone Encounter (Signed)
Patient states she has been taking MTX. Patient states she has been taking it on Thursday. Patient states she has been having nausea on Thursday. Patient states she would like to change the day she takes her MTX to Mondays and wanted to know how. Patient advised since she took her MTX on 12/13/2021 she should not take it on 12/17/2021 but wait until 12/24/2021. Patient expressed understanding. Patient advised if she continues to have nausea to let us know and Dr. Benjamine Mola may be able to send something for nausea. Patient expressed understanding.

## 2021-12-14 NOTE — Telephone Encounter (Signed)
Patient left a voicemail stating she has questions about her Methotrexate. Patient requests a call back. [phone]

## 2022-01-01 ENCOUNTER — Other Ambulatory Visit: Payer: Self-pay | Admitting: Internal Medicine

## 2022-01-01 DIAGNOSIS — M0579 Rheumatoid arthritis with rheumatoid factor of multiple sites without organ or systems involvement: Secondary | ICD-10-CM

## 2022-01-02 ENCOUNTER — Ambulatory Visit: Payer: Medicare Other | Admitting: Internal Medicine

## 2022-01-02 NOTE — Progress Notes (Unsigned)
Office Visit Note  [name]             Date of Birth: Jul 14, 1981           MRN: [id number]             PCP: Susy Frizzle, MD Referring: Susy Frizzle, MD Visit Date: 01/02/2022   Subjective:  No chief complaint on file.   History of Present Illness: Theresa Washington is a 41 y.o. female here for follow up ***   Previous HPI 10/31/21 Theresa Washington is a 41 y.o. female here for follow up with multiple joint pains and positive RF Abs most recent visit with lateral hip pain increase treated with a repeat trial of prednisone taper.  Symptoms improved again very well but return pretty quickly after discontinuing steroids.  She has left shoulder adhesive capsulitis treatment manipulation under anesthesia with Dr. Marlou Sa.  Despite this subsequent work with physical therapy they have expressed concern for abnormal incomplete healing still with limited range of motion in the left shoulder.  She saw ophthalmology with abnormal findings exam with retina specialist report reviewed identifying retina schisis in the right eye, bilateral dry eyes on artificial tears and Restasis, and findings consistent with atypical ocular migraine.   Previous HPI 07/10/21 Theresa Washington is a 41 y.o. female here for chronic joint of multiple sites with positive rheumatoid factor. She has history of left shoulder arthroscopy for rotator cuff tear and let hip arthroscopy for labral tear. Joint pains are mostly chronic worst in the shoulders, elbows, hips, and knees bilaterally. These were evaluated years ago with negative serology and attributed to her injury changes and fibromyalgia syndrome. She previously developed high increases in inflammatory markers despite negative antibody tests. Pain has gotten worse some in the shoulders and hips treated with orthopedics but now also more pain and swelling in elbows. She is stiff for more than an hour in mornings but also has worsening pain  with heavier use. She gets the most relief from prednisone and has taken intermittent short tapering doses with good relief but when recurrence afterwards. Individuals flares or episodes lasting about 7-10 days typically. Most recent treatment finished 2 days ago.   No Rheumatology ROS completed.   PMFS History:  Patient Active Problem List   Diagnosis Date Noted   Rash and other nonspecific skin eruption 11/30/2021   Adhesive capsulitis of left shoulder    Trochanteric bursitis, left hip 08/31/2021   Bilateral knee pain 08/01/2021   Rheumatoid arthritis with rheumatoid factor of multiple sites without organ or systems involvement ([place]) 07/10/2021   High risk medication use 07/10/2021   Essential hypertension 11/24/2020   Nonrheumatic mitral valve regurgitation 11/24/2020   Sacroiliac joint pain 01/12/2020   Smoker 11/30/2015   Palpitations 04/27/2014   Vasovagal syncope 04/26/2014   Spondylosis 02/24/2014   Hip pain 12/12/2013   Fibromyalgia 02/10/2013   Somatization disorder 02/10/2013   Pseudoseizures ([place]) 02/10/2013   DDD (degenerative disc disease), lumbosacral 02/10/2013   Spells 02/25/2012   History of idiopathic seizure 10/14/2011   SMOKER 05/20/2010   COPD (chronic obstructive pulmonary disease) ([place]) 05/20/2010   SHOULDER PAIN 10/27/2008   NECK PAIN 10/27/2008    Past Medical History:  Diagnosis Date   Anemia    Anxiety    Back pain    Bell's palsy    no residual   Contraceptive management 11/30/2015   DDD (degenerative disc disease)    C3-C5, T12-L, L3,  L4, L5, S1, S2   Fibromyalgia    History of kidney stones    passed stones   Hypertension    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[facility])    Myositis    Ocular migraine    Palpitations    yrs ago - only one episode r/t stress   PVC (premature ventricular contraction)    yrs ago - only one episode   Scoliosis    Seizures ([place])    unknown etiolgy; no meds, last seizure was in 2014   Smoker 11/30/2015   quit 2015    Spondylosis     Family History  Problem Relation Age of Onset   Multiple sclerosis Mother    Heart disease Father    Kidney disease Father    Alcohol abuse Brother    Mental retardation Brother    Schizophrenia Brother    Other Brother        back problems   ALS Brother    Colon cancer Maternal Grandmother    Healthy Son    Healthy Son    Past Surgical History:  Procedure Laterality Date   DILATION AND CURETTAGE OF UTERUS  11/18/1996   DILITATION & CURRETTAGE/HYSTROSCOPY WITH NOVASURE ABLATION N/A 01/16/2016   Procedure: HYSTEROSCOPY WITH NOVASURE ABLATION;  Surgeon: Jonnie Kind, MD;  Location: [facility];  Service: Gynecology;  Laterality: N/A;  Uterine Cavity Length 4.0 cm Uterine Cavity Width 3.3cm Power 73  Time 1 minute 17seconds   HIP SURGERY Left 2021   LAPAROSCOPIC BILATERAL SALPINGECTOMY Bilateral 01/16/2016   Procedure: LAPAROSCOPIC BILATERAL SALPINGECTOMY;  Surgeon: Jonnie Kind, MD;  Location: [facility];  Service: Gynecology;  Laterality: Bilateral;  procedure 1   OOPHORECTOMY  11/18/2004   Left side   SHOULDER ARTHROSCOPY Left 06/04/2021   Left shoulder arthroscopy with biceps tenodesis mini open rotator cuff tear repair   SHOULDER ARTHROSCOPY Left 09/04/2021   Procedure: LEFT SHOULDER MANIPULATION UNDER ANESTHESIA, ARTHROSCOPY WITH ROTATOR INTERVAL RELEASE;  Surgeon: Meredith Pel, MD;  Location: [facility];  Service: Orthopedics;  Laterality: Left;   Social History   Social History Narrative   Not on file   Immunization History  Administered Date(s) Administered   Influenza Whole 07/19/2012   Influenza,inj,Quad PF,6+ Mos 09/15/2015, 10/17/2017   Influenza-Unspecified 08/30/2011   Moderna Sars-Covid-2 Vaccination 08/26/2020, 09/23/2020   Tdap 03/01/2011     Objective: Vital Signs: There were no vitals taken for this visit.   Physical Exam   Musculoskeletal Exam: ***  CDAI Exam: CDAI Score: -- Patient Global: --; Provider Global: -- Swollen:  --; Tender: -- Joint Exam 01/02/2022   No joint exam has been documented for this visit   There is currently no information documented on the homunculus. Go to the Rheumatology activity and complete the homunculus joint exam.  Investigation: No additional findings.  Imaging: No results found.  Recent Labs: Lab Results  Component Value Date   WBC 9.2 11/13/2021   HGB 13.1 11/13/2021   PLT 375 11/13/2021   NA 138 11/13/2021   K 4.3 11/13/2021   CL 107 11/13/2021   CO2 24 11/13/2021   GLUCOSE 72 11/13/2021   BUN 14 11/13/2021   CREATININE 1.00 (H) 11/13/2021   BILITOT 0.2 11/13/2021   ALKPHOS 80 09/01/2015   AST 11 11/13/2021   ALT 8 11/13/2021   PROT 6.9 11/13/2021   ALBUMIN 4.0 09/01/2015   CALCIUM 9.1 11/13/2021   GFRAA 106 10/24/2020   QFTBGOLDPLUS NEGATIVE 07/10/2021    Speciality Comments: No specialty comments  available.  Procedures:  No procedures performed Allergies: Shrimp [shellfish allergy] and Other   Assessment / Plan:     Visit Diagnoses: No diagnosis found.  ***  Orders: No orders of the defined types were placed in this encounter.  No orders of the defined types were placed in this encounter.    Follow-Up Instructions: No follow-ups on file.   Collier Salina, MD  Note - This record has been created using [employer].  Chart creation errors have been sought, but may not always  have been located. Such creation errors do not reflect on  the standard of medical care.

## 2022-01-02 NOTE — Telephone Encounter (Signed)
Next Visit: 01/08/2022  Last Visit: 10/31/2021  Last Fill: 10/31/2021  DX:  Rheumatoid arthritis with rheumatoid factor of multiple sites without organ or systems involvement   Current Dose per office note 10/31/2021: methotrexate 15 mg p.o. weekly  Labs: 11/13/2021 Lab results are fine no problems after starting methotrexate and no change needed at this time.  Okay to refill Methotrexate?

## 2022-01-04 ENCOUNTER — Other Ambulatory Visit: Payer: Self-pay | Admitting: Internal Medicine

## 2022-01-04 DIAGNOSIS — M0579 Rheumatoid arthritis with rheumatoid factor of multiple sites without organ or systems involvement: Secondary | ICD-10-CM

## 2022-01-07 NOTE — Progress Notes (Signed)
Office Visit Note  [name]             Date of Birth: 1980-12-29           MRN: [id number]             PCP: Susy Frizzle, MD Referring: Susy Frizzle, MD Visit Date: 01/08/2022   Subjective:  Follow-up (Flare on Sunday)   History of Present Illness: Theresa Washington is a 41 y.o. female here for follow up for joint pains suspected seropositive RA after starting methotrexate 15 mg PO weekly and folic acid 1 mg daily in December. She suffered some nausea with the medication. She also developed skin rashes in the interval and recommended short term steroids for this. The rash was very itchy but cleared up with topical steroids. She had another episode of bilateral hip and sacral pain lasting 2 weeks that ended 2 days ago without obvious trigger or treatment. Methotrexate is causing significant nausea on the day of treatment and up to 2 days following.  Previous HPI 10/31/21 Theresa Washington is a 41 y.o. female here for follow up with multiple joint pains and positive RF Abs most recent visit with lateral hip pain increase treated with a repeat trial of prednisone taper.  Symptoms improved again very well but return pretty quickly after discontinuing steroids.  She has left shoulder adhesive capsulitis treatment manipulation under anesthesia with Dr. Marlou Sa.  Despite this subsequent work with physical therapy they have expressed concern for abnormal incomplete healing still with limited range of motion in the left shoulder.  She saw ophthalmology with abnormal findings exam with retina specialist report reviewed identifying retina schisis in the right eye, bilateral dry eyes on artificial tears and Restasis, and findings consistent with atypical ocular migraine.   Previous HPI 07/10/21 Theresa Washington is a 41 y.o. female here for chronic joint of multiple sites with positive rheumatoid factor. She has history of left shoulder arthroscopy for rotator cuff tear  and let hip arthroscopy for labral tear. Joint pains are mostly chronic worst in the shoulders, elbows, hips, and knees bilaterally. These were evaluated years ago with negative serology and attributed to her injury changes and fibromyalgia syndrome. She previously developed high increases in inflammatory markers despite negative antibody tests. Pain has gotten worse some in the shoulders and hips treated with orthopedics but now also more pain and swelling in elbows. She is stiff for more than an hour in mornings but also has worsening pain with heavier use. She gets the most relief from prednisone and has taken intermittent short tapering doses with good relief but when recurrence afterwards. Individuals flares or episodes lasting about 7-10 days typically. Most recent treatment finished 2 days ago.   Review of Systems  Constitutional:  Positive for fatigue.  HENT:  Positive for mouth dryness.   Eyes:  Positive for dryness.  Respiratory:  Negative for shortness of breath.   Cardiovascular:  Negative for swelling in legs/feet.  Gastrointestinal:  Negative for constipation.  Endocrine: Positive for cold intolerance and excessive thirst.  Genitourinary:  Negative for difficulty urinating.  Musculoskeletal:  Positive for joint pain, joint pain, joint swelling, muscle weakness, morning stiffness and muscle tenderness.  Skin:  Negative for rash.  Allergic/Immunologic: Negative for susceptible to infections.  Neurological:  Negative for numbness.  Hematological:  Negative for bruising/bleeding tendency.  Psychiatric/Behavioral:  Positive for sleep disturbance.    PMFS History:  Patient Active Problem List   Diagnosis  Date Noted   Livedo reticularis 01/08/2022   Rash and other nonspecific skin eruption 11/30/2021   Adhesive capsulitis of left shoulder    Trochanteric bursitis, left hip 08/31/2021   Bilateral knee pain 08/01/2021   Rheumatoid arthritis with rheumatoid factor of multiple sites  without organ or systems involvement ([place]) 07/10/2021   High risk medication use 07/10/2021   Essential hypertension 11/24/2020   Nonrheumatic mitral valve regurgitation 11/24/2020   Sacroiliac joint pain 01/12/2020   Smoker 11/30/2015   Palpitations 04/27/2014   Vasovagal syncope 04/26/2014   Spondylosis 02/24/2014   Hip pain 12/12/2013   Fibromyalgia 02/10/2013   Somatization disorder 02/10/2013   Pseudoseizures ([place]) 02/10/2013   DDD (degenerative disc disease), lumbosacral 02/10/2013   Spells 02/25/2012   History of idiopathic seizure 10/14/2011   SMOKER 05/20/2010   COPD (chronic obstructive pulmonary disease) ([place]) 05/20/2010   SHOULDER PAIN 10/27/2008   NECK PAIN 10/27/2008    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[place])    Myositis    Ocular migraine    Palpitations    yrs ago - only one episode r/t stress   PVC (premature ventricular contraction)    yrs ago - only one episode   Scoliosis    Seizures ([place])    unknown etiolgy; no meds, last seizure was in 2014   Smoker 11/30/2015   quit 2015   Spondylosis     Family History  Problem Relation Age of Onset   Multiple sclerosis Mother    Heart disease Father    Kidney disease Father    Alcohol abuse Brother    Mental retardation Brother    Schizophrenia Brother    Other Brother        back problems   ALS Brother    Colon cancer Maternal Grandmother    Healthy Son    Healthy Son    Past Surgical History:  Procedure Laterality Date   DILATION AND CURETTAGE OF UTERUS  11/18/1996   DILITATION & CURRETTAGE/HYSTROSCOPY WITH NOVASURE ABLATION N/A 01/16/2016   Procedure: HYSTEROSCOPY WITH NOVASURE ABLATION;  Surgeon: Jonnie Kind, MD;   Location: [facility];  Service: Gynecology;  Laterality: N/A;  Uterine Cavity Length 4.0 cm Uterine Cavity Width 3.3cm Power 73  Time 1 minute 17seconds   HIP SURGERY Left 2021   LAPAROSCOPIC BILATERAL SALPINGECTOMY Bilateral 01/16/2016   Procedure: LAPAROSCOPIC BILATERAL SALPINGECTOMY;  Surgeon: Jonnie Kind, MD;  Location: [facility];  Service: Gynecology;  Laterality: Bilateral;  procedure 1   OOPHORECTOMY  11/18/2004   Left side   SHOULDER ARTHROSCOPY Left 06/04/2021   Left shoulder arthroscopy with biceps tenodesis mini open rotator cuff tear repair   SHOULDER ARTHROSCOPY Left 09/04/2021   Procedure: LEFT SHOULDER MANIPULATION UNDER ANESTHESIA, ARTHROSCOPY WITH ROTATOR INTERVAL RELEASE;  Surgeon: Meredith Pel, MD;  Location: [facility];  Service: Orthopedics;  Laterality: Left;   Social History   Social History Narrative   Not on file   Immunization History  Administered Date(s) Administered   Influenza Whole 07/19/2012   Influenza,inj,Quad PF,6+ Mos 09/15/2015, 10/17/2017   Influenza-Unspecified 08/30/2011   Moderna Sars-Covid-2 Vaccination  08/26/2020, 09/23/2020   Tdap 03/01/2011     Objective: Vital Signs: BP 122/74 (BP Location: Left Arm, Patient Position: Sitting, Cuff Size: Normal)    Pulse 91    Resp 14    Ht 5' (1.524 m)    Wt 117 lb (53.1 kg)    BMI 22.85 kg/m    Physical Exam Cardiovascular:     Rate and Rhythm: Normal rate and regular rhythm.     Heart sounds: Murmur heard.  Pulmonary:     Effort: Pulmonary effort is normal.     Breath sounds: Normal breath sounds.  Skin:    General: Skin is warm and dry.     Comments: Livedo reticularis on arms, small erythematous papules on back of neck and under left side of chin, blanching erythema on upper chest  Neurological:     Mental Status: She is alert.  Psychiatric:        Mood and Affect: Mood normal.     Musculoskeletal Exam:  Neck full ROM no tenderness Shoulders slightly decreased left external  rotation Elbows full ROM no tenderness or swelling Wrists full ROM no tenderness or swelling Fingers full ROM no tenderness or swelling Knees full ROM no tenderness or swelling Ankles full ROM no tenderness or swelling   Investigation: No additional findings.  Imaging: No results found.  Recent Labs: Lab Results  Component Value Date   WBC 8.9 01/08/2022   HGB 12.9 01/08/2022   PLT 380 01/08/2022   NA 138 01/08/2022   K 4.8 01/08/2022   CL 107 01/08/2022   CO2 25 01/08/2022   GLUCOSE 88 01/08/2022   BUN 14 01/08/2022   CREATININE 0.87 01/08/2022   BILITOT 0.3 01/08/2022   ALKPHOS 80 09/01/2015   AST 14 01/08/2022   ALT 14 01/08/2022   PROT 6.8 01/08/2022   ALBUMIN 4.0 09/01/2015   CALCIUM 9.3 01/08/2022   GFRAA 106 10/24/2020   QFTBGOLDPLUS NEGATIVE 07/10/2021    Speciality Comments: No specialty comments available.  Procedures:  No procedures performed Allergies: Shrimp [shellfish allergy] and Other   Assessment / Plan:     Visit Diagnoses: Rheumatoid arthritis with rheumatoid factor of multiple sites without organ or systems involvement ([place])  Symptoms still pretty bothersome had been doing better until major flare up within past month. Skin rashes do not seem directly related to the methotrexate. GI intolerance is noticeable, will try switching to [place] dosing staying at 15 mg weekly and see if this reduces.  High risk medication use - Plan: CBC with Differential/Platelet, COMPLETE METABOLIC PANEL WITH GFR  Checking CBC and CMP today for methotrexate medication monitoring.   Fibromyalgia  Pretty good today, had 2 bad weeks recently especially in hips and low back not sure if this was return of bursitis or just chronic pain flare up.  Rash and other nonspecific skin eruption  Improved currently, not sure cause but responsive to topical steroid. Can use again PRN, monitor for now otherwise.  Livedo reticularis    Orders: Orders Placed This Encounter   Procedures   CBC with Differential/Platelet   COMPLETE METABOLIC PANEL WITH GFR   Meds ordered this encounter  Medications   methotrexate 50 MG/2ML injection    Sig: Inject 0.6 mLs (15 mg total) into the skin once a week.    Dispense:  4 mL    Refill:  2   TUBERCULIN SYR 1CC/26GX3/8" 26G X 3/8" 1 ML MISC    Sig: For injection of subcutaneous methotrexate once weekly  Dispense:  25 each    Refill:  0    Pharmacy may substitute for equivalent volume syringes and equipment eg needles as needed     Follow-Up Instructions: Return in about 3 months (around 04/07/2022) for RA on MTX switch to [place] f/u 37mos.   Collier Salina, MD  Note - This record has been created using [employer].  Chart creation errors have been sought, but may not always  have been located. Such creation errors do not reflect on  the standard of medical care.

## 2022-01-08 ENCOUNTER — Other Ambulatory Visit: Payer: Self-pay

## 2022-01-08 ENCOUNTER — Encounter: Payer: Self-pay | Admitting: Internal Medicine

## 2022-01-08 ENCOUNTER — Ambulatory Visit (INDEPENDENT_AMBULATORY_CARE_PROVIDER_SITE_OTHER): Payer: Medicare Other | Admitting: Internal Medicine

## 2022-01-08 VITALS — BP 122/74 | HR 91 | Resp 14 | Ht 60.0 in | Wt 117.0 lb

## 2022-01-08 DIAGNOSIS — R231 Pallor: Secondary | ICD-10-CM

## 2022-01-08 DIAGNOSIS — Z79899 Other long term (current) drug therapy: Secondary | ICD-10-CM

## 2022-01-08 DIAGNOSIS — R21 Rash and other nonspecific skin eruption: Secondary | ICD-10-CM | POA: Diagnosis not present

## 2022-01-08 DIAGNOSIS — M797 Fibromyalgia: Secondary | ICD-10-CM | POA: Diagnosis not present

## 2022-01-08 DIAGNOSIS — M0579 Rheumatoid arthritis with rheumatoid factor of multiple sites without organ or systems involvement: Secondary | ICD-10-CM | POA: Diagnosis not present

## 2022-01-08 LAB — COMPLETE METABOLIC PANEL WITH GFR
AG Ratio: 1.7 (calc) (ref 1.0–2.5)
ALT: 14 U/L (ref 6–29)
AST: 14 U/L (ref 10–30)
Albumin: 4.3 g/dL (ref 3.6–5.1)
Alkaline phosphatase (APISO): 47 U/L (ref 31–125)
BUN: 14 mg/dL (ref 7–25)
CO2: 25 mmol/L (ref 20–32)
Calcium: 9.3 mg/dL (ref 8.6–10.2)
Chloride: 107 mmol/L (ref 98–110)
Creat: 0.87 mg/dL (ref 0.50–0.99)
Globulin: 2.5 g/dL (calc) (ref 1.9–3.7)
Glucose, Bld: 88 mg/dL (ref 65–99)
Potassium: 4.8 mmol/L (ref 3.5–5.3)
Sodium: 138 mmol/L (ref 135–146)
Total Bilirubin: 0.3 mg/dL (ref 0.2–1.2)
Total Protein: 6.8 g/dL (ref 6.1–8.1)
eGFR: 86 mL/min/{1.73_m2} (ref >=60)

## 2022-01-08 LAB — CBC WITH DIFFERENTIAL/PLATELET
Absolute Monocytes: 596 cells/uL (ref 200–950)
Basophils Absolute: 36 cells/uL (ref 0–200)
Basophils Relative: 0.4 %
Eosinophils Absolute: 62 cells/uL (ref 15–500)
Eosinophils Relative: 0.7 %
HCT: 38.8 % (ref 35.0–45.0)
Hemoglobin: 12.9 g/dL (ref 11.7–15.5)
Lymphs Abs: 1762 cells/uL (ref 850–3900)
MCH: 31.2 pg (ref 27.0–33.0)
MCHC: 33.2 g/dL (ref 32.0–36.0)
MCV: 93.7 fL (ref 80.0–100.0)
MPV: 9.9 fL (ref 7.5–12.5)
Monocytes Relative: 6.7 %
Neutro Abs: 6444 cells/uL (ref 1500–7800)
Neutrophils Relative %: 72.4 %
Platelets: 380 10*3/uL (ref 140–400)
RBC: 4.14 10*6/uL (ref 3.80–5.10)
RDW: 12.8 % (ref 11.0–15.0)
Total Lymphocyte: 19.8 %
WBC: 8.9 10*3/uL (ref 3.8–10.8)

## 2022-01-12 MED ORDER — "TUBERCULIN SYRINGE 26G X 3/8"" 1 ML MISC": 0 refills | Status: DC

## 2022-01-12 MED ORDER — METHOTREXATE SODIUM CHEMO INJECTION 50 MG/2ML: 15.0000 mg | INTRAMUSCULAR | 2 refills | Status: DC

## 2022-01-24 ENCOUNTER — Other Ambulatory Visit: Payer: Self-pay | Admitting: Internal Medicine

## 2022-01-24 DIAGNOSIS — M0579 Rheumatoid arthritis with rheumatoid factor of multiple sites without organ or systems involvement: Secondary | ICD-10-CM

## 2022-01-24 NOTE — Telephone Encounter (Signed)
Next Visit: 03/27/2022 ? ?Last Visit: 01/08/2022 ? ?Last Fill: 10/31/2021 ? ?Dx: Rheumatoid arthritis with rheumatoid factor of multiple sites without organ or systems involvement  ? ?Current Dose per office note on 01/08/2022: not discussed ? ?Okay to refill Folic Acid?   ?

## 2022-01-30 ENCOUNTER — Telehealth: Payer: Self-pay | Admitting: Internal Medicine

## 2022-01-30 DIAGNOSIS — M25559 Pain in unspecified hip: Secondary | ICD-10-CM

## 2022-01-30 DIAGNOSIS — M0579 Rheumatoid arthritis with rheumatoid factor of multiple sites without organ or systems involvement: Secondary | ICD-10-CM

## 2022-01-30 DIAGNOSIS — R21 Rash and other nonspecific skin eruption: Secondary | ICD-10-CM

## 2022-01-30 NOTE — Telephone Encounter (Signed)
Patient called the office stating she is beginning to flare. Patient states her left hip is warm and tender to the touch. Patient states her right hip is beginning to get tender as well. Patient requested to make an appointment but with Dr. Marveen Reeks limited schedule she was unable to for another month. Patient requests a call back with something she may be able to do to help. ?

## 2022-01-31 NOTE — Telephone Encounter (Signed)
Patient called again today. Requesting a return call about what to do. Please advise.  ?

## 2022-02-01 MED ORDER — METHYLPREDNISOLONE 4 MG PO TBPK: ORAL_TABLET | ORAL | 0 refills | Status: DC

## 2022-02-01 NOTE — Telephone Encounter (Signed)
I spoke with Ms. Seehafer left hip pain since Sunday now with redness and warmth and right hip hurting less severely. We previously discussed bursitis injection but I will not be in the office so Rx sent for oral prednisone taper which previously worked well with similar flare up. Okay to take along with continued MTX.

## 2022-02-07 ENCOUNTER — Telehealth: Payer: Self-pay | Admitting: Family Medicine

## 2022-02-07 NOTE — Telephone Encounter (Signed)
Patient called to follow up on last mammogram. Patient received a letter from [facility] stating she may need a different kind of imaging than the one she received last year; patient unsure of what other kind of imaging she could need. Annual mammo due now. Requesting call back and advice/referral.  ? ?Please advise at [phone]. ?

## 2022-02-11 ENCOUNTER — Telehealth: Payer: Self-pay | Admitting: Internal Medicine

## 2022-02-11 NOTE — Telephone Encounter (Signed)
Patient states she has been taking MTX injection in the mornings since starting the injection. Patient states she would like to switch to taking it in the evening. Patient advised that would be okay. Patient states she does experiences a lot of fatigue after taking the injection. Patient states she would like to know if there is anything she can do for the fatigue. ?

## 2022-02-11 NOTE — Telephone Encounter (Signed)
Patient left a voicemail stating she had some questions regarding her Methotrexate injection. [phone] ?

## 2022-02-12 NOTE — Telephone Encounter (Signed)
Called pt, told pt per the letters in her chart from, it basically states that she will need to make appt for yearly mammo test again. Per pt expressed understanding and per pt will reck letter again. Nothing needed at this time 02/12/22 BT ?

## 2022-02-13 ENCOUNTER — Other Ambulatory Visit (HOSPITAL_COMMUNITY): Payer: Self-pay | Admitting: Family Medicine

## 2022-02-13 DIAGNOSIS — Z1231 Encounter for screening mammogram for malignant neoplasm of breast: Secondary | ICD-10-CM

## 2022-02-15 ENCOUNTER — Ambulatory Visit: Payer: Medicare Other | Admitting: Physician Assistant

## 2022-02-18 ENCOUNTER — Ambulatory Visit (HOSPITAL_COMMUNITY)
Admission: RE | Admit: 2022-02-18 | Discharge: 2022-02-18 | Disposition: A | Discharge status: Home / Self Care | Payer: Medicare Other | Source: Ambulatory Visit | Attending: Family Medicine | Admitting: Family Medicine

## 2022-02-18 DIAGNOSIS — Z1231 Encounter for screening mammogram for malignant neoplasm of breast: Secondary | ICD-10-CM | POA: Insufficient documentation

## 2022-02-26 ENCOUNTER — Encounter: Payer: Self-pay | Admitting: *Deleted

## 2022-03-01 ENCOUNTER — Ambulatory Visit (INDEPENDENT_AMBULATORY_CARE_PROVIDER_SITE_OTHER): Payer: Medicare Other | Admitting: Family Medicine

## 2022-03-01 VITALS — BP 118/80 | HR 77 | Temp 97.0°F | Ht 61.0 in | Wt 117.6 lb

## 2022-03-01 DIAGNOSIS — I340 Nonrheumatic mitral (valve) insufficiency: Secondary | ICD-10-CM

## 2022-03-01 DIAGNOSIS — R55 Syncope and collapse: Secondary | ICD-10-CM | POA: Diagnosis not present

## 2022-03-01 NOTE — Progress Notes (Signed)
? ?Subjective:  ? ? Patient ID: Theresa Washington, female    DOB: 01-18-1981, 41 y.o.   MRN: [id number] ? ?Wt Readings from Last 3 Encounters:  ?03/01/22 117 lb 9.6 oz (53.3 kg)  ?01/08/22 117 lb (53.1 kg)  ?10/31/21 115 lb (52.2 kg)  ? ?Patient states that over the last several weeks, she has had worsening presyncopal episodes.  She has not lost consciousness however she will suddenly and for no reason become lightheaded.  She will feel like she is going to pass out.  She will get hot all over her body.  She will feel extremely weak like she may drop to the ground.  She denies any palpitations or tachycardia during these events.  She denies any pleurisy or chest pain.  She saw cardiology last year who did a stress test and echo echocardiogram.  Stress test was normal but echocardiogram showed moderate mitral regurgitation.  Today on examination she has a 3/6 murmur heard best over the mitral valve.  I question if this could be worsening.  However most of her symptoms sound vasovagal in nature ? ?Past Medical History:  ?Diagnosis Date  ?? Anemia   ?? Anxiety   ?? Back pain   ?? Bell's palsy   ? no residual  ?? Contraceptive management 11/30/2015  ?? DDD (degenerative disc disease)   ? C3-C5, T12-L, L3, L4, L5, S1, S2  ?? Fibromyalgia   ?? History of kidney stones   ? passed stones  ?? Hypertension   ?? 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 Myositis   ?? Ocular migraine   ?? Palpitations   ? yrs ago - only one episode r/t stress  ?? PVC (premature ventricular contraction)   ? yrs ago - only one episode  ?? Scoliosis   ?? Seizures ([place])   ? unknown etiolgy; no meds, last seizure was in 2014  ?? Smoker 11/30/2015  ? quit 2015  ?? Spondylosis   ? ?Past Surgical History:  ?Procedure Laterality Date  ?? DILATION AND CURETTAGE OF UTERUS  11/18/1996  ?? DILITATION & CURRETTAGE/HYSTROSCOPY WITH NOVASURE ABLATION N/A 01/16/2016  ? Procedure: HYSTEROSCOPY WITH NOVASURE ABLATION;  Surgeon: Jonnie Kind, MD;  Location: [facility];  Service:  Gynecology;  Laterality: N/A;  Uterine Cavity Length 4.0 cm ?Uterine Cavity Width 3.3cm ?Power 73  ?Time 1 minute 17seconds  ?? HIP SURGERY Left 2021  ?? LAPAROSCOPIC BILATERAL SALPINGECTOMY Bilateral 01/16/2016  ? Procedure: LAPAROSCOPIC BILATERAL SALPINGECTOMY;  Surgeon: Jonnie Kind, MD;  Location: [facility];  Service: Gynecology;  Laterality: Bilateral;  procedure 1  ?? OOPHORECTOMY  11/18/2004  ? Left side  ?? SHOULDER ARTHROSCOPY Left 06/04/2021  ? Left shoulder arthroscopy with biceps tenodesis mini open rotator cuff tear repair  ?? SHOULDER ARTHROSCOPY Left 09/04/2021  ? Procedure: LEFT SHOULDER MANIPULATION UNDER ANESTHESIA, ARTHROSCOPY WITH ROTATOR INTERVAL RELEASE;  Surgeon: Meredith Pel, MD;  Location: [facility];  Service: Orthopedics;  Laterality: Left;  ? ?Current Outpatient Medications on File Prior to Visit  ?Medication Sig Dispense Refill  ?? celecoxib (CELEBREX) 100 MG capsule TAKE 1 CAPSULE(100 MG) BY MOUTH TWICE DAILY (Patient not taking: Reported on 01/08/2022) 60 capsule 3  ?? clobetasol cream (TEMOVATE) 2.20 % Apply 1 application topically 2 (two) times daily as needed. 30 g 0  ?? diclofenac sodium (VOLTAREN) 1 % GEL Apply 1 application topically as needed (pain).    ?? EPINEPHrine (EPIPEN JR) 0.15 MG/0.3ML injection Inject 0.15 mg into the muscle as needed for anaphylaxis.    ??  folic acid (FOLVITE) 1 MG tablet TAKE 1 TABLET(1 MG) BY MOUTH DAILY 90 tablet 0  ?? methocarbamol (ROBAXIN) 500 MG tablet Take 1 tablet (500 mg total) by mouth every 8 (eight) hours as needed for muscle spasms. (Patient not taking: Reported on 10/31/2021) 30 tablet 0  ?? methotrexate 50 MG/2ML injection Inject 0.6 mLs (15 mg total) into the skin once a week. 4 mL 2  ?? methylcellulose (ARTIFICIAL TEARS) 1 % ophthalmic solution Place 1 drop into both eyes daily as needed. Dry Eyes    ?? methylPREDNISolone (MEDROL DOSEPAK) 4 MG TBPK tablet Take tablets by mouth once daily with food: decreasing 6, 5, 4, 3, 2, then 1  tablet daily 21 tablet 0  ?? Misc Natural Products (GLUCOSAMINE CHONDROITIN TRIPLE) TABS Take 1 tablet by mouth 2 (two) times daily.    ?? Multiple Vitamin (MULTIVITAMIN WITH MINERALS) TABS tablet Take 1 tablet by mouth daily.    ?? Omega-3 Fatty Acids (FISH OIL) 1000 MG CAPS Take 2,000 mg by mouth daily.    ?? rizatriptan (MAXALT) 10 MG tablet Take 1 tablet (10 mg total) by mouth as needed for migraine. May repeat in 2 hours if needed 10 tablet 3  ?? topiramate (TOPAMAX) 25 MG tablet TAKE 2 TABLETS(50 MG) BY MOUTH TWICE DAILY 360 tablet 1  ?? TUBERCULIN SYR 1CC/26GX3/8" 26G X 3/8" 1 ML MISC For injection of subcutaneous methotrexate once weekly 25 each 0  ? ?No current facility-administered medications on file prior to visit.  ? ?Allergies  ?Allergen Reactions  ?? Shrimp [Shellfish Allergy] Anaphylaxis, Hives and Swelling  ?  Pt states "I carry an Epipen. I had anaphylactic reaction to something but I don't know what".  ?  ?? Other   ?  Adhesive tape caused blisters. ?Okay to use paper tape.  ? ?Social History  ? ?Socioeconomic History  ?? Marital status: Married  ?  Spouse name: Not on file  ?? Number of children: Not on file  ?? Years of education: Not on file  ?? Highest education level: Not on file  ?Occupational History  ?? Not on file  ?Tobacco Use  ?? Smoking status: Former  ?  Packs/day: 1.00  ?  Years: 20.00  ?  Pack years: 20.00  ?  Types: Cigarettes  ?  Quit date: 09/12/2016  ?  Years since quitting: 5.4  ?? Smokeless tobacco: Never  ?Vaping Use  ?? Vaping Use: Every day  ?? Substances: Nicotine, CBD, Flavoring  ?? Devices: Georga Bora  ?Substance and Sexual Activity  ?? Alcohol use: Yes  ?  Comment: socially  ?? Drug use: Never  ?? Sexual activity: Yes  ?  Birth control/protection: Surgical  ?  Comment: tubal ligation and ablation  ?Other Topics Concern  ?? Not on file  ?Social History Narrative  ?? Not on file  ? ?Social Determinants of Health  ? ?Financial Resource Strain: Not on file  ?Food  Insecurity: Not on file  ?Transportation Needs: Not on file  ?Physical Activity: Not on file  ?Stress: Not on file  ?Social Connections: Not on file  ?Intimate Partner Violence: Not on file  ? ? ? ? ? ?Review of Systems  ?All other systems reviewed and are negative. ? ?   ?Objective:  ? Physical Exam ?Vitals reviewed.  ?Constitutional:   ?   General: She is not in acute distress. ?   Appearance: She is well-developed. She is not diaphoretic.  ?Cardiovascular:  ?   Rate and Rhythm:  Normal rate and regular rhythm.  ?   Heart sounds: Murmur heard.  ?Systolic murmur is present with a grade of 3/6.  ?Pulmonary:  ?   Effort: Pulmonary effort is normal. No respiratory distress.  ?   Breath sounds: Normal breath sounds. No wheezing or rales.  ?Abdominal:  ?   General: Bowel sounds are normal. There is no distension.  ?   Palpations: Abdomen is soft.  ?   Tenderness: There is no abdominal tenderness. There is no guarding or rebound.  ?Skin: ?   General: Skin is warm and dry.  ? ? ? ?   ?Assessment & Plan:  ?Near syncope - Plan: ECHOCARDIOGRAM COMPLETE ? ?Mitral valve insufficiency, unspecified etiology - Plan: ECHOCARDIOGRAM COMPLETE ?I suspect vasovagal episodes however her murmur over mitral valve is louder than previously documented.  Therefore we will repeat echocardiogram to ensure that mitral valve regurgitation has not progressed.  If echo is normal, we consider a Holter monitor to rule out any cardiac arrhythmias and would also consider empirically trying the patient on [place] given the frequency of the vasovagal events. ?

## 2022-03-05 ENCOUNTER — Ambulatory Visit (HOSPITAL_COMMUNITY): Payer: Medicare Other | Attending: Family Medicine

## 2022-03-05 DIAGNOSIS — I340 Nonrheumatic mitral (valve) insufficiency: Secondary | ICD-10-CM | POA: Insufficient documentation

## 2022-03-05 DIAGNOSIS — R55 Syncope and collapse: Secondary | ICD-10-CM | POA: Diagnosis not present

## 2022-03-05 LAB — ECHOCARDIOGRAM COMPLETE
Area-P 1/2: 3.8 cm2
MV M vel: 5.47 m/s
MV Peak grad: 119.7 mmHg
MV VTI: 1.39 cm2
S' Lateral: 2.6 cm

## 2022-03-06 NOTE — Progress Notes (Signed)
?Cardiology Office Note:   ? ?Date:  03/07/2022  ? ?ID:  Theresa Washington, DOB 1981-09-15, MRN [id number] ? ?PCP:  Susy Frizzle, MD  ?[facility] Cardiologist:  Werner Lean, MD  ?[facility] Electrophysiologist:  None  ? ?Chief Complaint: lightheadedness  ? ?History of Present Illness:   ? ?Theresa Washington is a 41 y.o. female with a hx of von Willebrand's Disease, HTN, recurrent palpitations, vasovagal syncope former tobacco user and mitral regurgitation seen for follow up.  ? ?Seen by Dr. Gasper Sells 11/2020 for possible exercise related syncope. Follow up [place] 12/2020 without evidence of ischemia at given workload. No exercise induced arrhythmias. Echo 12/2020 showed LVEF of 60-65%. Mild to moderate MR.  ?  ?Seen by PCP 03/01/22 for lightheadedness and near syncope. No associated palpitations. Suspected vasovagal episode. Recommended cardiology follow up given hx of mitral insufficiency and murmur heard on exam.  ? ?Echo 03/05/2022 ? 1. Left ventricular ejection fraction, by estimation, is 60 to 65%. The  ?left ventricle has normal function. The left ventricle has no regional  ?wall motion abnormalities. Left ventricular diastolic parameters were  ?normal.  ? 2. Right ventricular systolic function is normal. The right ventricular  ?size is normal. There is normal pulmonary artery systolic pressure.  ? 3. The mitral valve is normal in structure. Mild mitral valve  ?regurgitation. No evidence of mitral stenosis.  ? 4. The aortic valve is normal in structure. Aortic valve regurgitation is  ?not visualized. No aortic stenosis is present.  ? 5. The inferior vena cava is normal in size with greater than 50%  ?respiratory variability, suggesting right atrial pressure of 3 mmHg ? ?Mitral Valve: The mitral valve is normal in structure. Mild mitral valve  ?regurgitation. No evidence of mitral valve stenosis. MV peak gradient,  ?11.7 mmHg. The mean mitral valve gradient is 3.0 mmHg.  ? ?Here today for  further discussion.  Patient reports intermittent dizziness lasting for few seconds to hours since started on methotrexate for rheumatoid arthritis.  She suddenly feels dizzy with shortness of breath and flushed feeling.  No syncope.  Seen by PCP and sent here.  Her symptoms is similar when seen by Dr. Glenford Bayley January 2022 but less intensity this time.  Denies orthopnea, PND, syncope, lower extremity edema or melena.  She walks 1.5 miles each day without shortness of breath or any issue. ? ?Past Medical History:  ?Diagnosis Date  ? Anemia   ? Anxiety   ? Back pain   ? Bell's palsy   ? no residual  ? Contraceptive management 11/30/2015  ? DDD (degenerative disc disease)   ? C3-C5, T12-L, L3, L4, L5, S1, S2  ? Fibromyalgia   ? History of kidney stones   ? passed stones  ? Hypertension   ? 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 Myositis   ? Ocular migraine   ? Palpitations   ? yrs ago - only one episode r/t stress  ? PVC (premature ventricular contraction)   ? yrs ago - only one episode  ? Scoliosis   ? Seizures ([place])   ? unknown etiolgy; no meds, last seizure was in 2014  ? Smoker 11/30/2015  ? quit 2015  ? Spondylosis   ? ? ?Past Surgical History:  ?Procedure Laterality Date  ? DILATION AND CURETTAGE OF UTERUS  11/18/1996  ? DILITATION & CURRETTAGE/HYSTROSCOPY WITH NOVASURE ABLATION N/A 01/16/2016  ? Procedure: HYSTEROSCOPY WITH NOVASURE ABLATION;  Surgeon: Jonnie Kind, MD;  Location: [facility];  Service: Gynecology;  Laterality: N/A;  Uterine Cavity Length 4.0 cm ?Uterine Cavity Width 3.3cm ?Power 73  ?Time 1 minute 17seconds  ? HIP SURGERY Left 2021  ? LAPAROSCOPIC BILATERAL SALPINGECTOMY Bilateral 01/16/2016  ? Procedure: LAPAROSCOPIC BILATERAL SALPINGECTOMY;  Surgeon: Jonnie Kind, MD;  Location: [facility];  Service: Gynecology;  Laterality: Bilateral;  procedure 1  ? OOPHORECTOMY  11/18/2004  ? Left side  ? SHOULDER ARTHROSCOPY Left 06/04/2021  ? Left shoulder arthroscopy with biceps tenodesis mini open rotator cuff  tear repair  ? SHOULDER ARTHROSCOPY Left 09/04/2021  ? Procedure: LEFT SHOULDER MANIPULATION UNDER ANESTHESIA, ARTHROSCOPY WITH ROTATOR INTERVAL RELEASE;  Surgeon: Meredith Pel, MD;  Location: [facility];  Service: Orthopedics;  Laterality: Left;  ? ? ?Current Medications: ?Current Meds  ?Medication Sig  ? clobetasol cream (TEMOVATE) 7.56 % Apply 1 application topically 2 (two) times daily as needed.  ? diclofenac sodium (VOLTAREN) 1 % GEL Apply 1 application topically as needed (pain).  ? EPINEPHrine (EPIPEN JR) 0.15 MG/0.3ML injection Inject 0.15 mg into the muscle as needed for anaphylaxis.  ? folic acid (FOLVITE) 1 MG tablet TAKE 1 TABLET(1 MG) BY MOUTH DAILY  ? methotrexate 50 MG/2ML injection Inject 0.6 mLs (15 mg total) into the skin once a week.  ? methylcellulose (ARTIFICIAL TEARS) 1 % ophthalmic solution Place 1 drop into both eyes daily as needed. Dry Eyes  ? Misc Natural Products (GLUCOSAMINE CHONDROITIN TRIPLE) TABS Take 1 tablet by mouth 2 (two) times daily.  ? Multiple Vitamin (MULTIVITAMIN WITH MINERALS) TABS tablet Take 1 tablet by mouth daily.  ? Omega-3 Fatty Acids (FISH OIL) 1000 MG CAPS Take 2,000 mg by mouth daily.  ? rizatriptan (MAXALT) 10 MG tablet Take 1 tablet (10 mg total) by mouth as needed for migraine. May repeat in 2 hours if needed  ? topiramate (TOPAMAX) 25 MG tablet TAKE 2 TABLETS(50 MG) BY MOUTH TWICE DAILY  ? TUBERCULIN SYR 1CC/26GX3/8" 26G X 3/8" 1 ML MISC For injection of subcutaneous methotrexate once weekly  ?  ? ?Allergies:   Shrimp [shellfish allergy] and Other  ? ?Social History  ? ?Socioeconomic History  ? Marital status: Married  ?  Spouse name: Not on file  ? Number of children: Not on file  ? Years of education: Not on file  ? Highest education level: Not on file  ?Occupational History  ? Not on file  ?Tobacco Use  ? Smoking status: Former  ?  Packs/day: 1.00  ?  Years: 20.00  ?  Pack years: 20.00  ?  Types: Cigarettes  ?  Quit date: 09/12/2016  ?  Years since  quitting: 5.4  ? Smokeless tobacco: Never  ?Vaping Use  ? Vaping Use: Every day  ? Substances: Nicotine, CBD, Flavoring  ? Devices: Georga Bora  ?Substance and Sexual Activity  ? Alcohol use: Yes  ?  Comment: socially  ? Drug use: Never  ? Sexual activity: Yes  ?  Birth control/protection: Surgical  ?  Comment: tubal ligation and ablation  ?Other Topics Concern  ? Not on file  ?Social History Narrative  ? Not on file  ? ?Social Determinants of Health  ? ?Financial Resource Strain: Not on file  ?Food Insecurity: Not on file  ?Transportation Needs: Not on file  ?Physical Activity: Not on file  ?Stress: Not on file  ?Social Connections: Not on file  ?  ? ?Family History: ?The patient's family history includes ALS in her brother; Alcohol abuse in her brother; Colon  cancer in her maternal grandmother; Healthy in her son and son; Heart disease in her father; Kidney disease in her father; Mental retardation in her brother; Multiple sclerosis in her mother; Other in her brother; Schizophrenia in her brother.   ? ?ROS:   ?Please see the history of present illness.    ?All other systems reviewed and are negative.  ? ?EKGs/Labs/Other Studies Reviewed:   ? ?The following studies were reviewed today: ? ?Echo 12/2020 ? 1. Left ventricular ejection fraction, by estimation, is 60 to 65%. The  ?left ventricle has normal function. The left ventricle has no regional  ?wall motion abnormalities. Left ventricular diastolic parameters were  ?normal. The average left ventricular  ?global longitudinal strain is -21.1 %. The global longitudinal strain is  ?normal.  ? 2. Right ventricular systolic function is normal. The right ventricular  ?size is normal. There is normal pulmonary artery systolic pressure.  ? 3. The mitral valve is grossly normal. Mild to moderate mitral valve  ?regurgitation. No evidence of mitral stenosis.  ? 4. The aortic valve is grossly normal. Aortic valve regurgitation is not  ?visualized. No aortic stenosis is  present.  ? 5. The inferior vena cava is normal in size with greater than 50%  ?respiratory variability, suggesting right atrial pressure of 3 mmHg.  ? ?POET 12/2020 ?Blood pressure demonstrated a normal response

## 2022-03-07 ENCOUNTER — Ambulatory Visit (INDEPENDENT_AMBULATORY_CARE_PROVIDER_SITE_OTHER): Payer: Medicare Other

## 2022-03-07 ENCOUNTER — Encounter: Payer: Self-pay | Admitting: Physician Assistant

## 2022-03-07 ENCOUNTER — Ambulatory Visit: Payer: Medicare Other | Admitting: Physician Assistant

## 2022-03-07 VITALS — BP 112/82 | HR 100 | Ht 61.0 in | Wt 114.0 lb

## 2022-03-07 DIAGNOSIS — R55 Syncope and collapse: Secondary | ICD-10-CM

## 2022-03-07 DIAGNOSIS — I10 Essential (primary) hypertension: Secondary | ICD-10-CM

## 2022-03-07 DIAGNOSIS — R42 Dizziness and giddiness: Secondary | ICD-10-CM

## 2022-03-07 NOTE — Progress Notes (Unsigned)
[id number] zio xt from office inventory applied to patient. ? ?Dr. Gasper Sells to read. ?

## 2022-03-07 NOTE — Patient Instructions (Addendum)
Medication Instructions:  ?Your physician recommends that you continue on your current medications as directed. Please refer to the Current Medication list given to you today. ?*If you need a refill on your cardiac medications before your next appointment, please call your pharmacy* ? ? ?Lab Work: ?None Ordered ? ? ? ?Testing/Procedures: ?ZIO XT- Long Term Monitor Instructions ? ?Your physician has requested you wear a ZIO patch monitor for 14 days.  ?This is a single patch monitor. [employer] supplies one patch monitor per enrollment. Additional ?stickers are not available. Please do not apply patch if you will be having a Nuclear Stress Test,  ?Echocardiogram, Cardiac CT, MRI, or Chest Xray during the period you would be wearing the  ?monitor. The patch cannot be worn during these tests. You cannot remove and re-apply the  ?ZIO XT patch monitor.  ?Your ZIO patch monitor will be mailed 3 day USPS to your address on file. It may take 3-5 days  ?to receive your monitor after you have been enrolled.  ?Once you have received your monitor, please review the enclosed instructions. Your monitor  ?has already been registered assigning a specific monitor serial # to you. ? ?Billing and Patient Assistance Program Information ? ?We have supplied [employer] with any of your insurance information on file for billing purposes. ?[employer] offers a sliding scale Patient Assistance Program for patients that do not have  ?insurance, or whose insurance does not completely cover the cost of the ZIO monitor.  ?You must apply for the Patient Assistance Program to qualify for this discounted rate.  ?To apply, please call [employer] at [phone], select option 4, select option 2, ask to apply for  ?Patient Assistance Program. Theodore Demark will ask your household income, and how many people  ?are in your household. They will quote your out-of-pocket cost based on that information.  ?[employer] will also be able to set up a 45-month interest-free payment  plan if needed. ? ?Applying the monitor ?  ?Shave hair from upper left chest.  ?Hold abrader disc by orange tab. Rub abrader in 40 strokes over the upper left chest as  ?indicated in your monitor instructions.  ?Clean area with 4 enclosed alcohol pads. Let dry.  ?Apply patch as indicated in monitor instructions. Patch will be placed under collarbone on left  ?side of chest with arrow pointing upward.  ?Rub patch adhesive wings for 2 minutes. Remove white label marked "1". Remove the white  ?label marked "2". Rub patch adhesive wings for 2 additional minutes.  ?While looking in a mirror, press and release button in center of patch. A small green light will  ?flash 3-4 times. This will be your only indicator that the monitor has been turned on.  ?Do not shower for the first 24 hours. You may shower after the first 24 hours.  ?Press the button if you feel a symptom. You will hear a small click. Record Date, Time and  ?Symptom in the Patient Logbook.  ?When you are ready to remove the patch, follow instructions on the last 2 pages of Patient  ?Logbook. Stick patch monitor onto the last page of Patient Logbook.  ?Place Patient Logbook in the blue and white box. Use locking tab on box and tape box closed  ?securely. The blue and white box has prepaid postage on it. Please place it in the mailbox as  ?soon as possible. Your physician should have your test results approximately 7 days after the  [facility]  ?  Call [facility] at [phone] if you have questions regarding  ?your ZIO XT patch monitor. Call them immediately if you see an orange light blinking on your  ?monitor.  ?If your monitor falls off in less than 4 days, contact our Monitor department at [phone].  ?If your monitor becomes loose or falls off after 4 days call [employer] at [phone] for  ?suggestions on securing your monitor ? ? ? ?Follow-Up: ?At [facility], you and your health needs are  our priority.  As part of our continuing mission to provide you with exceptional heart care, we have created designated Provider Care Teams.  These Care Teams include your primary Cardiologist (physician) and Advanced Practice Providers (APPs -  Physician Assistants and Nurse Practitioners) who all work together to provide you with the care you need, when you need it. ? ?We recommend signing up for the patient portal called "MyChart".  Sign up information is provided on this After Visit Summary.  MyChart is used to connect with patients for Virtual Visits (Telemedicine).  Patients are able to view lab/test results, encounter notes, upcoming appointments, etc.  Non-urgent messages can be sent to your provider as well.   ?To learn more about what you can do with MyChart, go to [email or web address].   ? ?Your next appointment:   ?1 year(s) ? ?The format for your next appointment:   ?In Person ? ?Provider:   ?Werner Lean, MD   ? ? ?Other Instructions ? ? ?Important Information About Sugar ? ? ? ? ?  ?

## 2022-03-09 ENCOUNTER — Other Ambulatory Visit: Payer: Self-pay | Admitting: Internal Medicine

## 2022-03-09 DIAGNOSIS — M0579 Rheumatoid arthritis with rheumatoid factor of multiple sites without organ or systems involvement: Secondary | ICD-10-CM

## 2022-03-18 ENCOUNTER — Other Ambulatory Visit: Payer: Self-pay | Admitting: Family Medicine

## 2022-03-21 NOTE — Progress Notes (Signed)
? ?Office Visit Note ? ?[name]             ?Date of Birth: [birth year]           ?MRN: [id number]             ?PCP: Susy Frizzle, MD ?Referring: Susy Frizzle, MD ?Visit Date: 03/27/2022 ? ? ?Subjective:  ?Follow-up (Mouth sores) ? ? ?History of Present Illness: Theresa Washington is a 41 y.o. female here for follow up for joint pains suspected seropositive RA after starting methotrexate 0.10m once weekly and folic acid 1 mg daily. She has had some flare ups of hip bursitis but frequency is significantly less than before. Last bad episode was with right hip pain, she associates this with increased use doing spring garden and landscape work. Skin rashes are partially better around her neck and improve with topical clobetasol as needed. Since about 2 days ago she developed painful sores on the right lower part of her mouth. These are worst whenever eating. Pain also affecting her throat and has tenderness to pressure along the bottom of her jaw on right side. She does not recall any preceding event or change besides eating chocolate before this started. She has a history of infrequent cold sores in the past but never in this distribution these usually get better on their own after a few days. ? ?Previous HPI ?01/08/2022 ?Theresa WIRSINGis a 41y.o. female here for follow up for joint pains suspected seropositive RA after starting methotrexate 15 mg PO weekly and folic acid 1 mg daily in December. She suffered some nausea with the medication. She also developed skin rashes in the interval and recommended short term steroids for this. The rash was very itchy but cleared up with topical steroids. She had another episode of bilateral hip and sacral pain lasting 2 weeks that ended 2 days ago without obvious trigger or treatment. Methotrexate is causing significant nausea on the day of treatment and up to 2 days following. ?  ?Previous HPI ?10/31/21 ?Theresa ABELSONis a 41y.o. female  here for follow up with multiple joint pains and positive RF Abs most recent visit with lateral hip pain increase treated with a repeat trial of prednisone taper.  Symptoms improved again very well but return pretty quickly after discontinuing steroids.  She has left shoulder adhesive capsulitis treatment manipulation under anesthesia with Dr. DMarlou Sa  Despite this subsequent work with physical therapy they have expressed concern for abnormal incomplete healing still with limited range of motion in the left shoulder.  She saw ophthalmology with abnormal findings exam with retina specialist report reviewed identifying retina schisis in the right eye, bilateral dry eyes on artificial tears and Restasis, and findings consistent with atypical ocular migraine. ?  ?Previous HPI ?07/10/21 ?Theresa DIXis a 41y.o. female here for chronic joint of multiple sites with positive rheumatoid factor. She has history of left shoulder arthroscopy for rotator cuff tear and let hip arthroscopy for labral tear. Joint pains are mostly chronic worst in the shoulders, elbows, hips, and knees bilaterally. These were evaluated years ago with negative serology and attributed to her injury changes and fibromyalgia syndrome. She previously developed high increases in inflammatory markers despite negative antibody tests. Pain has gotten worse some in the shoulders and hips treated with orthopedics but now also more pain and swelling in elbows. She is stiff for more than an hour in mornings but also has worsening pain  with heavier use. She gets the most relief from prednisone and has taken intermittent short tapering doses with good relief but when recurrence afterwards. Individuals flares or episodes lasting about 7-10 days typically. Most recent treatment finished 2 days ago. ? ? ?Review of Systems  ?Constitutional:  Positive for fatigue.  ?HENT:  Positive for mouth sores, sore throat and mouth dryness.   ?Eyes:  Positive for dryness.   ?Respiratory:  Negative for shortness of breath.   ?Cardiovascular:  Negative for swelling in legs/feet.  ?Gastrointestinal:  Negative for constipation.  ?Endocrine: Negative for excessive thirst.  ?Genitourinary:  Negative for difficulty urinating.  ?Musculoskeletal:  Positive for joint pain, joint pain, muscle weakness, morning stiffness and muscle tenderness.  ?Skin:  Positive for rash.  ?Allergic/Immunologic: Negative for susceptible to infections.  ?Neurological:  Positive for weakness.  ?Hematological:  Negative for bruising/bleeding tendency.  ?Psychiatric/Behavioral:  Negative for sleep disturbance.   ? ?PMFS History:  ?Patient Active Problem List  ? Diagnosis Date Noted  ? Mouth ulcers 03/27/2022  ? Livedo reticularis 01/08/2022  ? Rash and other nonspecific skin eruption 11/30/2021  ? Adhesive capsulitis of left shoulder   ? Trochanteric bursitis, left hip 08/31/2021  ? Bilateral knee pain 08/01/2021  ? Rheumatoid arthritis with rheumatoid factor of multiple sites without organ or systems involvement ([place]) 07/10/2021  ? High risk medication use 07/10/2021  ? Essential hypertension 11/24/2020  ? Nonrheumatic mitral valve regurgitation 11/24/2020  ? Sacroiliac joint pain 01/12/2020  ? Smoker 11/30/2015  ? Palpitations 04/27/2014  ? Vasovagal syncope 04/26/2014  ? Spondylosis 02/24/2014  ? Hip pain 12/12/2013  ? Fibromyalgia 02/10/2013  ? Somatization disorder 02/10/2013  ? Pseudoseizures 02/10/2013  ? DDD (degenerative disc disease), lumbosacral 02/10/2013  ? Spells 02/25/2012  ? History of idiopathic seizure 10/14/2011  ? SMOKER 05/20/2010  ? COPD (chronic obstructive pulmonary disease) ([place]) 05/20/2010  ? SHOULDER PAIN 10/27/2008  ? NECK PAIN 10/27/2008  ?  ?Past Medical History:  ?Diagnosis Date  ? Anemia   ? Anxiety   ? Back pain   ? Bell's palsy   ? no residual  ? Contraceptive management 11/30/2015  ? DDD (degenerative disc disease)   ? C3-C5, T12-L, L3, L4, L5, S1, S2  ? Fibromyalgia   ? History of  kidney stones   ? passed stones  ? Hypertension   ? 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 Myositis   ? Ocular migraine   ? Palpitations   ? yrs ago - only one episode r/t stress  ? PVC (premature ventricular contraction)   ? yrs ago - only one episode  ? Scoliosis   ? Seizures ([place])   ? unknown etiolgy; no meds, last seizure was in 2014  ? Smoker 11/30/2015  ? quit 2015  ? Spondylosis   ?  ?Family History  ?Problem Relation Age of Onset  ? Multiple sclerosis Mother   ? Heart disease Father   ? Kidney disease Father   ? Alcohol abuse Brother   ? Mental retardation Brother   ? Schizophrenia Brother   ? Other Brother   ?     back problems  ? ALS Brother   ? Colon cancer Maternal Grandmother   ? Healthy Son   ? Healthy Son   ? ?Past Surgical History:  ?Procedure Laterality Date  ? DILATION AND CURETTAGE OF UTERUS  11/18/1996  ? DILITATION & CURRETTAGE/HYSTROSCOPY WITH NOVASURE ABLATION N/A 01/16/2016  ? Procedure: HYSTEROSCOPY WITH NOVASURE ABLATION;  Surgeon: Jonnie Kind, MD;  Location: [facility];  Service: Gynecology;  Laterality: N/A;  Uterine Cavity Length 4.0 cm ?Uterine Cavity Width 3.3cm ?Power 73  ?Time 1 minute 17seconds  ? HIP SURGERY Left 2021  ? LAPAROSCOPIC BILATERAL SALPINGECTOMY Bilateral 01/16/2016  ? Procedure: LAPAROSCOPIC BILATERAL SALPINGECTOMY;  Surgeon: Jonnie Kind, MD;  Location: [facility];  Service: Gynecology;  Laterality: Bilateral;  procedure 1  ? OOPHORECTOMY  11/18/2004  ? Left side  ? SHOULDER ARTHROSCOPY Left 06/04/2021  ? Left shoulder arthroscopy with biceps tenodesis mini open rotator cuff tear repair  ? SHOULDER ARTHROSCOPY Left 09/04/2021  ? Procedure: LEFT SHOULDER MANIPULATION UNDER ANESTHESIA, ARTHROSCOPY WITH ROTATOR INTERVAL RELEASE;  Surgeon: Meredith Pel, MD;  Location: [facility];  Service: Orthopedics;  Laterality: Left;  ? ?Social History  ? ?Social History Narrative  ? Not on file  ? ?Immunization History  ?Administered Date(s) Administered  ? Influenza Whole 07/19/2012  ?  Influenza,inj,Quad PF,6+ Mos 09/15/2015, 10/17/2017  ? Influenza-Unspecified 08/30/2011  ? Moderna Sars-Covid-2 Vaccination 08/26/2020, 09/23/2020  ? Tdap 03/01/2011  ?  ? ?Objective: ?Vital Signs: BP 120/82 (BP

## 2022-03-26 DIAGNOSIS — R42 Dizziness and giddiness: Secondary | ICD-10-CM | POA: Diagnosis not present

## 2022-03-26 DIAGNOSIS — R55 Syncope and collapse: Secondary | ICD-10-CM | POA: Diagnosis not present

## 2022-03-26 DIAGNOSIS — I10 Essential (primary) hypertension: Secondary | ICD-10-CM | POA: Diagnosis not present

## 2022-03-27 ENCOUNTER — Encounter: Payer: Self-pay | Admitting: Internal Medicine

## 2022-03-27 ENCOUNTER — Ambulatory Visit: Payer: Medicare Other | Admitting: Internal Medicine

## 2022-03-27 ENCOUNTER — Telehealth: Payer: Self-pay | Admitting: Internal Medicine

## 2022-03-27 VITALS — BP 120/82 | HR 89 | Resp 14 | Ht 60.5 in | Wt 112.0 lb

## 2022-03-27 DIAGNOSIS — K121 Other forms of stomatitis: Secondary | ICD-10-CM

## 2022-03-27 DIAGNOSIS — M0579 Rheumatoid arthritis with rheumatoid factor of multiple sites without organ or systems involvement: Secondary | ICD-10-CM

## 2022-03-27 DIAGNOSIS — R231 Pallor: Secondary | ICD-10-CM

## 2022-03-27 DIAGNOSIS — M797 Fibromyalgia: Secondary | ICD-10-CM | POA: Diagnosis not present

## 2022-03-27 DIAGNOSIS — Z79899 Other long term (current) drug therapy: Secondary | ICD-10-CM

## 2022-03-27 DIAGNOSIS — R21 Rash and other nonspecific skin eruption: Secondary | ICD-10-CM

## 2022-03-27 MED ORDER — VALACYCLOVIR HCL 1 G PO TABS: 1000.0000 mg | ORAL_TABLET | Freq: Two times a day (BID) | ORAL | 0 refills | Status: AC

## 2022-03-27 MED ORDER — LIDOCAINE VISCOUS HCL 2 % MT SOLN: 5.0000 mL | Freq: Four times a day (QID) | OROMUCOSAL | 0 refills | Status: DC | PRN

## 2022-03-27 NOTE — Telephone Encounter (Signed)
Patient advised Dr. Benjamine Mola has sent the prescription to [facility].  ?

## 2022-03-27 NOTE — Telephone Encounter (Signed)
Theresa Washington from [facility] in [place] called the office stating they received a prescription for magic mouthwash compound. She states they do not do any compounding at that location and requests the prescription be sent to [facility] in [place].  ?[facility] fax# [phone] ?

## 2022-03-28 LAB — COMPLETE METABOLIC PANEL WITH GFR
AG Ratio: 1.7 (calc) (ref 1.0–2.5)
ALT: 15 U/L (ref 6–29)
AST: 14 U/L (ref 10–30)
Albumin: 4.2 g/dL (ref 3.6–5.1)
Alkaline phosphatase (APISO): 49 U/L (ref 31–125)
BUN: 16 mg/dL (ref 7–25)
CO2: 23 mmol/L (ref 20–32)
Calcium: 8.9 mg/dL (ref 8.6–10.2)
Chloride: 107 mmol/L (ref 98–110)
Creat: 0.88 mg/dL (ref 0.50–0.99)
Globulin: 2.5 g/dL (calc) (ref 1.9–3.7)
Glucose, Bld: 56 mg/dL — ABNORMAL LOW (ref 65–99)
Potassium: 4.1 mmol/L (ref 3.5–5.3)
Sodium: 139 mmol/L (ref 135–146)
Total Bilirubin: 0.4 mg/dL (ref 0.2–1.2)
Total Protein: 6.7 g/dL (ref 6.1–8.1)
eGFR: 85 mL/min/{1.73_m2} (ref >=60)

## 2022-03-28 LAB — CBC WITH DIFFERENTIAL/PLATELET
Absolute Monocytes: 706 cells/uL (ref 200–950)
Basophils Absolute: 39 cells/uL (ref 0–200)
Basophils Relative: 0.7 %
Eosinophils Absolute: 90 cells/uL (ref 15–500)
Eosinophils Relative: 1.6 %
HCT: 41.3 % (ref 35.0–45.0)
Hemoglobin: 13.5 g/dL (ref 11.7–15.5)
Lymphs Abs: 1495 cells/uL (ref 850–3900)
MCH: 31.6 pg (ref 27.0–33.0)
MCHC: 32.7 g/dL (ref 32.0–36.0)
MCV: 96.7 fL (ref 80.0–100.0)
MPV: 10 fL (ref 7.5–12.5)
Monocytes Relative: 12.6 %
Neutro Abs: 3270 cells/uL (ref 1500–7800)
Neutrophils Relative %: 58.4 %
Platelets: 320 10*3/uL (ref 140–400)
RBC: 4.27 10*6/uL (ref 3.80–5.10)
RDW: 12.6 % (ref 11.0–15.0)
Total Lymphocyte: 26.7 %
WBC: 5.6 10*3/uL (ref 3.8–10.8)

## 2022-03-28 LAB — SEDIMENTATION RATE: Sed Rate: 14 mm/h (ref 0–20)

## 2022-03-28 NOTE — Progress Notes (Signed)
Lab results look fine for continuing methotrexate. She can let us know if the mouth ulcers are not improving as expected.

## 2022-04-01 ENCOUNTER — Telehealth: Payer: Self-pay | Admitting: Internal Medicine

## 2022-04-01 NOTE — Telephone Encounter (Signed)
New Message: ? ? ? ? ?Patient would like  for somebody to go over her Monitor results with her. She see the results are on My Chart, just need somebody to explain it. ?

## 2022-04-01 NOTE — Telephone Encounter (Signed)
Pt aware monitor has not been reviewed by Robbie Lis PA at this time will call once reviewed  ?Pt needs results called to her husbands mobile number her phone not on at this time ./cy ?

## 2022-04-03 ENCOUNTER — Other Ambulatory Visit (HOSPITAL_COMMUNITY)
Admission: RE | Admit: 2022-04-03 | Discharge: 2022-04-03 | Disposition: A | Discharge status: Home / Self Care | Payer: Medicare Other | Source: Ambulatory Visit | Attending: Adult Health | Admitting: Adult Health

## 2022-04-03 ENCOUNTER — Ambulatory Visit (INDEPENDENT_AMBULATORY_CARE_PROVIDER_SITE_OTHER): Payer: Medicare Other | Admitting: Adult Health

## 2022-04-03 ENCOUNTER — Encounter: Payer: Self-pay | Admitting: Adult Health

## 2022-04-03 VITALS — BP 122/77 | HR 64 | Ht 60.0 in | Wt 112.0 lb

## 2022-04-03 DIAGNOSIS — Z131 Encounter for screening for diabetes mellitus: Secondary | ICD-10-CM | POA: Insufficient documentation

## 2022-04-03 DIAGNOSIS — Z1211 Encounter for screening for malignant neoplasm of colon: Secondary | ICD-10-CM | POA: Diagnosis not present

## 2022-04-03 DIAGNOSIS — Z01419 Encounter for gynecological examination (general) (routine) without abnormal findings: Secondary | ICD-10-CM | POA: Insufficient documentation

## 2022-04-03 DIAGNOSIS — Z1151 Encounter for screening for human papillomavirus (HPV): Secondary | ICD-10-CM | POA: Insufficient documentation

## 2022-04-03 LAB — HEMOCCULT GUIAC POC 1CARD (OFFICE): Fecal Occult Blood, POC: NEGATIVE

## 2022-04-03 NOTE — Progress Notes (Signed)
Patient ID: KIYARA BOUFFARD, female   DOB: 1981-03-15, 41 y.o.   MRN: [id number] ?History of Present Illness: ?Wesleigh is a 41 year old white female,married, G2P2 in for a well woman gyn exam and pap. ?PCP is Dr Dennard Schaumann. ? ? ?Current Medications, Allergies, Past Medical History, Past Surgical History, Family History and Social History were reviewed in [employer] record.   ? ? ?Review of Systems: ?Patient denies any headaches, hearing loss, fatigue, blurred vision, shortness of breath, chest pain, abdominal pain, problems with bowel movements, urination, or intercourse. No mood swings.  ?Denies any vaginal bleeding sp ablation. ?She has RA and fibromyalgia  and has had elevated HR at times ? ? ?Physical Exam:BP 122/77 (BP Location: Left Arm, Patient Position: Sitting, Cuff Size: Normal)   Pulse 64   Ht 5' (1.524 m)   Wt 112 lb (50.8 kg)   LMP  (LMP Unknown)   BMI 21.87 kg/m?   ?General:  Well developed, well nourished, no acute distress ?Skin:  Warm and dry ?Neck:  Midline trachea, normal thyroid, good ROM, no lymphadenopathy ?Lungs; Clear to auscultation bilaterally ?Breast:  No dominant palpable mass, retraction, or nipple discharge,has regular irregularities both breast, L>R ?Cardiovascular: Regular rate and rhythm ?Abdomen:  Soft, non tender, no hepatosplenomegaly ?Pelvic:  External genitalia is normal in appearance, no lesions.  The vagina is normal in appearance. Urethra has no lesions or masses. The cervix is bulbous.Pap with HR HPV genotyping performed.  Uterus is felt to be normal size, shape, and contour.  No adnexal masses or tenderness noted.Bladder is non tender, no masses felt. ?Rectal: Good sphincter tone, no polyps, or hemorrhoids felt.  Hemoccult negative. ?Extremities/musculoskeletal:  No swelling or varicosities noted, no clubbing or cyanosis ?Psych:  No mood changes, alert and cooperative,seems happy ?AA is 1 ?Fall risk is low ? ?  04/03/2022  ? 11:28 AM 08/05/2021   ? 11:22 AM 09/10/2019  ?  2:05 PM  ?Depression screen PHQ 2/9  ?Decreased Interest 0 0 0  ?Down, Depressed, Hopeless 0 0 0  ?PHQ - 2 Score 0 0 0  ?Altered sleeping 0    ?Tired, decreased energy 1    ?Change in appetite 1    ?Feeling bad or failure about yourself  0    ?Trouble concentrating 0    ?Moving slowly or fidgety/restless 0    ?Suicidal thoughts 0    ?PHQ-9 Score 2    ?  ? ?  04/03/2022  ? 11:28 AM  ?GAD 7 : Generalized Anxiety Score  ?Nervous, Anxious, on Edge 0  ?Control/stop worrying 0  ?Worry too much - different things 0  ?Trouble relaxing 0  ?Restless 0  ?Easily annoyed or irritable 0  ?Afraid - awful might happen 0  ?Total GAD 7 Score 0  ? ? [facility] - 04/03/22 1133   ? ?  ? Pregnancy Intention Screening  ? Does the patient want to become pregnant in the next year? N/A   ? Does the patient's partner want to become pregnant in the next year? N/A   ? Would the patient like to discuss contraceptive options today? N/A   ?  ? Contraception Wrap Up  ? Current Method Female Sterilization   ? End Method Female Sterilization   ? Contraception Counseling Provided No   ? ?  ?  ? ?  ?  ? Examination chaperoned by Marcelino Scot RN ? ? ?Impression and Plan: ?1. Encounter for gynecological examination with Papanicolaou smear of  cervix ?Pap sent ?Physical with PCP ?Pap in 3 years if normal ?Labs with PCP ?Mammogram yearly ? ?2. Encounter for screening fecal occult blood testing ?Hemoccult negative  ? ?3. Screening for diabetes mellitus ?Will check A1c  ? ? ? ?  ?  ?

## 2022-04-04 ENCOUNTER — Other Ambulatory Visit: Payer: Self-pay

## 2022-04-04 LAB — HEMOGLOBIN A1C
Est. average glucose Bld gHb Est-mCnc: 100 mg/dL
Hgb A1c MFr Bld: 5.1 % (ref 4.8–5.6)

## 2022-04-04 MED ORDER — DILTIAZEM HCL ER COATED BEADS 120 MG PO CP24: 120.0000 mg | ORAL_CAPSULE | Freq: Every day | ORAL | 2 refills | Status: DC

## 2022-04-05 LAB — CYTOLOGY - PAP
Comment: NEGATIVE
Diagnosis: NEGATIVE
High risk HPV: NEGATIVE

## 2022-04-10 NOTE — Telephone Encounter (Signed)
See monitor results notes ./cy 

## 2022-04-18 ENCOUNTER — Telehealth: Payer: Self-pay | Admitting: *Deleted

## 2022-04-18 DIAGNOSIS — K121 Other forms of stomatitis: Secondary | ICD-10-CM

## 2022-04-18 NOTE — Telephone Encounter (Signed)
Patient contacted the office and left message to advise that she has started having mouth sore in the left side of her mouth. Patient states this started on 04/14/2022. Requesting a refill on the magic mouthwash. Patient also wanted to let you know she also saw the cardiologist and would like for you to review the office note.

## 2022-04-19 MED ORDER — LIDOCAINE VISCOUS HCL 2 % MT SOLN: 5.0000 mL | Freq: Four times a day (QID) | OROMUCOSAL | 0 refills | Status: DC | PRN

## 2022-04-19 NOTE — Telephone Encounter (Signed)
There is no problem with taking these medications together. If it is causing constant side effects probably better to try stopping the methotrexate. If symptoms worsen after doing so we can discuss alternative options.

## 2022-04-19 NOTE — Addendum Note (Signed)
Addended by: Collier Salina on: 04/19/2022 08:12 AM   Modules accepted: Orders

## 2022-04-19 NOTE — Telephone Encounter (Signed)
Patient advised New Rx sent for mouthwash PRN. The last office note Dr. Benjamine Mola can see to review was on 4/20. Patient states that there is not a more recent office note but was advised about the heart monitor report showed a few episodes of brief rapid heart rate. Patient states she was advised by the cardiologist she has PFSVT. Cardiologist wants to put her on Cardizem and she is concerned about taking this with the Methotrexate. She states the cardiologist told her it was okay to take them together but she wanted to ask you as well. Patient states she is having exhaustion while on the MTX. Patient states this is a constant thing. Patient states she has been on it for about 6 months. Patient was switched to the injectable MTX in February 2023.  Please advise.

## 2022-04-19 NOTE — Telephone Encounter (Signed)
Patient advised there is no problem with taking these medications together. If it is causing constant side effects probably better to try stopping the methotrexate. If symptoms worsen after doing so we can discuss alternative options. Patient expressed understanding.

## 2022-04-19 NOTE — Telephone Encounter (Signed)
New Rx sent for mouthwash PRN. The last office note I can see to review was on 4/20 was there a more recent visit than this? I do see her heart monitor report showed a few episodes of brief rapid heart rate.

## 2022-05-13 ENCOUNTER — Other Ambulatory Visit: Payer: Self-pay | Admitting: Internal Medicine

## 2022-05-13 ENCOUNTER — Telehealth: Payer: Self-pay | Admitting: Internal Medicine

## 2022-05-13 DIAGNOSIS — M0579 Rheumatoid arthritis with rheumatoid factor of multiple sites without organ or systems involvement: Secondary | ICD-10-CM

## 2022-05-13 MED ORDER — PREDNISONE 10 MG PO TABS: ORAL_TABLET | ORAL | 0 refills | Status: AC

## 2022-05-14 ENCOUNTER — Ambulatory Visit: Payer: Medicare Other | Admitting: Physician Assistant

## 2022-05-14 ENCOUNTER — Ambulatory Visit: Payer: Medicare Other | Admitting: Family Medicine

## 2022-05-27 ENCOUNTER — Ambulatory Visit: Payer: Medicare Other | Admitting: Surgical

## 2022-05-27 DIAGNOSIS — M898X1 Other specified disorders of bone, shoulder: Secondary | ICD-10-CM | POA: Diagnosis not present

## 2022-05-29 NOTE — Patient Instructions (Signed)
Theresa Washington , Thank you for taking time to come for your Medicare Wellness Visit. I appreciate your ongoing commitment to your health goals. Please review the following plan we discussed and let me know if I can assist you in the future.   Screening recommendations/referrals: Colonoscopy: Due at age 41. Mammogram: Due at age 60. Bone Density: Due at age 4. Recommended yearly ophthalmology/optometry visit for glaucoma screening and checkup Recommended yearly dental visit for hygiene and checkup  Vaccinations: Influenza vaccine: Due Fall 2023. Pneumococcal vaccine: Due at age 65. Tdap vaccine: Done 03/01/2011. Repeat in 10 years  Shingles vaccine: Due at age 54.  Covid-19: Done 09/23/2020, 08/26/2020.  Advanced directives: Advance directive discussed with you today. I have provided a copy for you to complete at home and have notarized. Once this is complete please bring a copy in to our office so we can scan it into your chart.   Conditions/risks identified: Aim for 30 minutes of exercise or brisk walking, 6-8 glasses of water, and 5 servings of fruits and vegetables each day.  Next appointment: Follow up in one year for your annual wellness visit. 2024  Preventive Care 40-64 Years, Female Preventive care refers to lifestyle choices and visits with your health care provider that can promote health and wellness. What does preventive care include? A yearly physical exam. This is also called an annual well check. Dental exams once or twice a year. Routine eye exams. Ask your health care provider how often you should have your eyes checked. Personal lifestyle choices, including: Daily care of your teeth and gums. Regular physical activity. Eating a healthy diet. Avoiding tobacco and drug use. Limiting alcohol use. Practicing safe sex. Taking low-dose aspirin daily starting at age 60. Taking vitamin and mineral supplements as recommended by your health care provider. What happens  during an annual well check? The services and screenings done by your health care provider during your annual well check will depend on your age, overall health, lifestyle risk factors, and family history of disease. Counseling  Your health care provider may ask you questions about your: Alcohol use. Tobacco use. Drug use. Emotional well-being. Home and relationship well-being. Sexual activity. Eating habits. Work and work Statistician. Method of birth control. Menstrual cycle. Pregnancy history. Screening  You may have the following tests or measurements: Height, weight, and BMI. Blood pressure. Lipid and cholesterol levels. These may be checked every 5 years, or more frequently if you are over 26 years old. Skin check. Lung cancer screening. You may have this screening every year starting at age 106 if you have a 30-pack-year history of smoking and currently smoke or have quit within the past 15 years. Fecal occult blood test (FOBT) of the stool. You may have this test every year starting at age 39. Flexible sigmoidoscopy or colonoscopy. You may have a sigmoidoscopy every 5 years or a colonoscopy every 10 years starting at age 43. Hepatitis C blood test. Hepatitis B blood test. Sexually transmitted disease (STD) testing. Diabetes screening. This is done by checking your blood sugar (glucose) after you have not eaten for a while (fasting). You may have this done every 1-3 years. Mammogram. This may be done every 1-2 years. Talk to your health care provider about when you should start having regular mammograms. This may depend on whether you have a family history of breast cancer. BRCA-related cancer screening. This may be done if you have a family history of breast, ovarian, tubal, or peritoneal cancers. Pelvic exam and  Pap test. This may be done every 3 years starting at age 44. Starting at age 36, this may be done every 5 years if you have a Pap test in combination with an HPV  test. Bone density scan. This is done to screen for osteoporosis. You may have this scan if you are at high risk for osteoporosis. Discuss your test results, treatment options, and if necessary, the need for more tests with your health care provider. Vaccines  Your health care provider may recommend certain vaccines, such as: Influenza vaccine. This is recommended every year. Tetanus, diphtheria, and acellular pertussis (Tdap, Td) vaccine. You may need a Td booster every 10 years. Zoster vaccine. You may need this after age 52. Pneumococcal 13-valent conjugate (PCV13) vaccine. You may need this if you have certain conditions and were not previously vaccinated. Pneumococcal polysaccharide (PPSV23) vaccine. You may need one or two doses if you smoke cigarettes or if you have certain conditions. Talk to your health care provider about which screenings and vaccines you need and how often you need them. This information is not intended to replace advice given to you by your health care provider. Make sure you discuss any questions you have with your health care provider. Document Released: 12/01/2015 Document Revised: 07/24/2016 Document Reviewed: 09/05/2015 Elsevier Interactive Patient Education  2017 [place] Prevention in the Home Falls can cause injuries. They can happen to people of all ages. There are many things you can do to make your home safe and to help prevent falls. What can I do on the outside of my home? Regularly fix the edges of walkways and driveways and fix any cracks. Remove anything that might make you trip as you walk through a door, such as a raised step or threshold. Trim any bushes or trees on the path to your home. Use bright outdoor lighting. Clear any walking paths of anything that might make someone trip, such as rocks or tools. Regularly check to see if handrails are loose or broken. Make sure that both sides of any steps have handrails. Any raised  decks and porches should have guardrails on the edges. Have any leaves, snow, or ice cleared regularly. Use sand or salt on walking paths during winter. Clean up any spills in your garage right away. This includes oil or grease spills. What can I do in the bathroom? Use night lights. Install grab bars by the toilet and in the tub and shower. Do not use towel bars as grab bars. Use non-skid mats or decals in the tub or shower. If you need to sit down in the shower, use a plastic, non-slip stool. Keep the floor dry. Clean up any water that spills on the floor as soon as it happens. Remove soap buildup in the tub or shower regularly. Attach bath mats securely with double-sided non-slip rug tape. Do not have throw rugs and other things on the floor that can make you trip. What can I do in the bedroom? Use night lights. Make sure that you have a light by your bed that is easy to reach. Do not use any sheets or blankets that are too big for your bed. They should not hang down onto the floor. Have a firm chair that has side arms. You can use this for support while you get dressed. Do not have throw rugs and other things on the floor that can make you trip. What can I do in the kitchen? Clean up any  spills right away. Avoid walking on wet floors. Keep items that you use a lot in easy-to-reach places. If you need to reach something above you, use a strong step stool that has a grab bar. Keep electrical cords out of the way. Do not use floor polish or wax that makes floors slippery. If you must use wax, use non-skid floor wax. Do not have throw rugs and other things on the floor that can make you trip. What can I do with my stairs? Do not leave any items on the stairs. Make sure that there are handrails on both sides of the stairs and use them. Fix handrails that are broken or loose. Make sure that handrails are as long as the stairways. Check any carpeting to make sure that it is firmly attached  to the stairs. Fix any carpet that is loose or worn. Avoid having throw rugs at the top or bottom of the stairs. If you do have throw rugs, attach them to the floor with carpet tape. Make sure that you have a light switch at the top of the stairs and the bottom of the stairs. If you do not have them, ask someone to add them for you. What else can I do to help prevent falls? Wear shoes that: Do not have high heels. Have rubber bottoms. Are comfortable and fit you well. Are closed at the toe. Do not wear sandals. If you use a stepladder: Make sure that it is fully opened. Do not climb a closed stepladder. Make sure that both sides of the stepladder are locked into place. Ask someone to hold it for you, if possible. Clearly mark and make sure that you can see: Any grab bars or handrails. First and last steps. Where the edge of each step is. Use tools that help you move around (mobility aids) if they are needed. These include: Canes. Walkers. Scooters. Crutches. Turn on the lights when you go into a dark area. Replace any light bulbs as soon as they burn out. Set up your furniture so you have a clear path. Avoid moving your furniture around. If any of your floors are uneven, fix them. If there are any pets around you, be aware of where they are. Review your medicines with your doctor. Some medicines can make you feel dizzy. This can increase your chance of falling. Ask your doctor what other things that you can do to help prevent falls. This information is not intended to replace advice given to you by your health care provider. Make sure you discuss any questions you have with your health care provider. Document Released: 08/31/2009 Document Revised: 04/11/2016 Document Reviewed: 12/09/2014 Elsevier Interactive Patient Education  2017 [employer].

## 2022-05-30 ENCOUNTER — Ambulatory Visit (INDEPENDENT_AMBULATORY_CARE_PROVIDER_SITE_OTHER): Payer: Medicare Other

## 2022-05-30 ENCOUNTER — Telehealth: Payer: Self-pay

## 2022-05-30 ENCOUNTER — Encounter: Payer: Self-pay | Admitting: Orthopedic Surgery

## 2022-05-30 VITALS — BP 108/52 | HR 65 | Ht 60.0 in | Wt 112.0 lb

## 2022-05-30 DIAGNOSIS — Z0000 Encounter for general adult medical examination without abnormal findings: Secondary | ICD-10-CM | POA: Diagnosis not present

## 2022-05-30 NOTE — Progress Notes (Signed)
Office Visit Note   [name]           Date of Birth: [birth year]           MRN: [id number] Visit Date: 05/27/2022 Requested by: Susy Frizzle, MD [address] [place],  [place] [address] PCP: Susy Frizzle, MD  Subjective: Chief Complaint  Patient presents with   Left Shoulder - Pain    HPI: Theresa Washington is a 41 y.o. female who presents to the office complaining of left shoulder pain.  Patient states that she has had about 3 months of increased left shoulder pain with tightness that she feels in the posterior aspect of the shoulder.  She states that this tightness sensation is worse with driving.  No new injury.  Localizes pain to the posterolateral aspect of the left shoulder.  She has scapular pain and numbness and tingling that travels down the posterior arm to the elbow.  She notices numbness and tingling several times throughout the day.  She has catching sensation in the shoulder blade when she brings her arm back in the AB ducted position.  She has history of rheumatoid arthritis and takes methotrexate for her RA every Monday.  Her rheumatologist is Dr. Benjamine Mola.  She also has a history of cervical spine problems with a reported history of facet arthritis.  She has had previous ESI's.  She sees Dr. Ellene Route.  Has never had neck surgery.  Does have history of left shoulder rotator cuff repair and biceps tenodesis by Dr. Marlou Sa about a year ago..                ROS: All systems reviewed are negative as they relate to the chief complaint within the history of present illness.  Patient denies fevers or chills.  Assessment & Plan: Visit Diagnoses:  1. Pain in scapula     Plan: Patient is a 41 year old female who presents for evaluation of left shoulder pain.  Has had increased pain for about 3 months that she mostly localizes to the posterior lateral aspect of the shoulder as well as the scapular region.  She has associated numbness and tingling that she  notices several times per day that travels down the posterior upper arm.  Excellent rotator cuff strength on exam today.  No evidence of failure of the rotator cuff with biceps tenodesis.  May be cervical spine related given her history of ESI's and the associated numbness and tingling.  Plan is to try physical therapy in [place] for cervical spine and scapular exercises 1-2 times per week for 6 weeks.  Follow-up in 6 weeks for clinical recheck.  Follow-Up Instructions: No follow-ups on file.   Orders:  No orders of the defined types were placed in this encounter.  No orders of the defined types were placed in this encounter.     Procedures: No procedures performed   Clinical Data: No additional findings.  Objective: Vital Signs: There were no vitals taken for this visit.  Physical Exam:  Constitutional: Patient appears well-developed HEENT:  Head: Normocephalic Eyes:EOM are normal Neck: Normal range of motion Cardiovascular: Normal rate Pulmonary/chest: Effort normal Neurologic: Patient is alert Skin: Skin is warm Psychiatric: Patient has normal mood and affect  Ortho Exam: Ortho exam demonstrates left shoulder with 40 degrees external rotation, 100 degrees abduction, 160 degrees forward flexion.  Excellent rotator cuff strength of supra, infra, subscap.  No Popeye deformity noted.  Incisions are well-healed from prior  left shoulder surgery.  Mild tenderness throughout the axial cervical spine.  Mild discomfort with cervical spine range of motion.  Negative Spurling sign.  Negative Lhermitte sign.  No significant crepitus noted with passive motion of the shoulder.  She is able to bring her arm overhead without much difficulty.  Specialty Comments:  No specialty comments available.  Imaging: No results found.   PMFS History: Patient Active Problem List   Diagnosis Date Noted   Screening for diabetes mellitus 04/03/2022   Encounter for screening fecal occult blood  testing 04/03/2022   Encounter for gynecological examination with Papanicolaou smear of cervix 04/03/2022   Mouth ulcers 03/27/2022   Livedo reticularis 01/08/2022   Rash and other nonspecific skin eruption 11/30/2021   Adhesive capsulitis of left shoulder    Trochanteric bursitis, left hip 08/31/2021   Bilateral knee pain 08/01/2021   Rheumatoid arthritis with rheumatoid factor of multiple sites without organ or systems involvement ([place]) 07/10/2021   High risk medication use 07/10/2021   Essential hypertension 11/24/2020   Nonrheumatic mitral valve regurgitation 11/24/2020   Sacroiliac joint pain 01/12/2020   Smoker 11/30/2015   Palpitations 04/27/2014   Vasovagal syncope 04/26/2014   Spondylosis 02/24/2014   Hip pain 12/12/2013   Fibromyalgia 02/10/2013   Somatization disorder 02/10/2013   Pseudoseizures 02/10/2013   DDD (degenerative disc disease), lumbosacral 02/10/2013   Spells 02/25/2012   History of idiopathic seizure 10/14/2011   SMOKER 05/20/2010   COPD (chronic obstructive pulmonary disease) ([place]) 05/20/2010   SHOULDER PAIN 10/27/2008   NECK PAIN 10/27/2008   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[place])    Myositis    Ocular migraine    Palpitations    yrs ago - only one episode r/t stress   PVC (premature ventricular contraction)    yrs ago - only one episode   Scoliosis    Seizures ([place])    unknown etiolgy; no meds, last seizure was in 2014   Smoker 11/30/2015   quit 2015   Spondylosis     Family History  Problem Relation Age of Onset   Multiple sclerosis Mother    Heart disease Father    Kidney disease Father    Alcohol abuse Brother    Mental retardation Brother    Schizophrenia  Brother    Other Brother        back problems   ALS Brother    Colon cancer Maternal Grandmother    Healthy Son    Healthy Son     Past Surgical History:  Procedure Laterality Date   DILATION AND CURETTAGE OF UTERUS  11/18/1996   DILITATION & CURRETTAGE/HYSTROSCOPY WITH NOVASURE ABLATION N/A 01/16/2016   Procedure: HYSTEROSCOPY WITH NOVASURE ABLATION;  Surgeon: Jonnie Kind, MD;  Location: [facility];  Service: Gynecology;  Laterality: N/A;  Uterine Cavity Length 4.0 cm Uterine Cavity Width 3.3cm Power 73  Time 1 minute 17seconds   HIP SURGERY Left 2021   LAPAROSCOPIC BILATERAL SALPINGECTOMY Bilateral 01/16/2016   Procedure: LAPAROSCOPIC BILATERAL SALPINGECTOMY;  Surgeon: Jonnie Kind, MD;  Location: [facility];  Service: Gynecology;  Laterality: Bilateral;  procedure  1   OOPHORECTOMY  11/18/2004   Left side   SHOULDER ARTHROSCOPY Left 06/04/2021   Left shoulder arthroscopy with biceps tenodesis mini open rotator cuff tear repair   SHOULDER ARTHROSCOPY Left 09/04/2021   Procedure: LEFT SHOULDER MANIPULATION UNDER ANESTHESIA, ARTHROSCOPY WITH ROTATOR INTERVAL RELEASE;  Surgeon: Meredith Pel, MD;  Location: [facility];  Service: Orthopedics;  Laterality: Left;   Social History   Occupational History   Not on file  Tobacco Use   Smoking status: Former    Packs/day: 1.00    Years: 20.00    Total pack years: 20.00    Types: Cigarettes    Quit date: 09/12/2016    Years since quitting: 5.7   Smokeless tobacco: Never  Vaping Use   Vaping Use: Every day   Substances: Nicotine, CBD, Flavoring   Devices: [employer]  Substance and Sexual Activity   Alcohol use: Yes    Comment: socially   Drug use: Never   Sexual activity: Yes    Birth control/protection: Surgical    Comment: tubal ligation and ablation

## 2022-05-30 NOTE — Telephone Encounter (Signed)
Pt states she was just dx with SVT and has been started on Cardizem 120 mg daily. Pt states she is "nervous" about taking the medication and would like to know if you feel it is safe for her to take with other medications. Pt also would like to know if she should be concerned with her BP dropping with the medication? Thank you.

## 2022-05-30 NOTE — Progress Notes (Signed)
Subjective:   Theresa Washington is a 41 y.o. female who presents for an Initial Medicare Annual Wellness Visit. IN OFFICE VISIT. Review of Systems     Cardiac Risk Factors include: advanced age (>58mn, >>56women);hypertension;sedentary lifestyle;Other (see comment), Risk factor comments: COPD, SVT, DDD.     Objective:    Today's Vitals   05/30/22 1124 05/30/22 1127  BP:  (!) 108/52  Pulse:  65  SpO2:  99%  Weight: 112 lb (50.8 kg) 112 lb (50.8 kg)  Height: 5' (1.524 m) 5' (1.524 m)  PainSc:  8    Body mass index is 21.87 kg/m.     05/30/2022   11:36 AM 09/04/2021   12:59 PM 08/05/2021   11:17 AM 04/02/2017   10:39 AM 01/12/2016    8:47 AM 01/24/2015   12:03 PM 04/26/2014    9:19 PM  Advanced Directives  Does Patient Have a Medical Advance Directive? No No No No No No Patient does not have advance directive;Patient would not like information  Would patient like information on creating a medical advance directive? No - Patient declined No - Patient declined Yes (ED - Information included in AVS) No - Patient declined No - patient declined information No - patient declined information   Pre-existing out of facility DNR order (yellow form or pink MOST form)       No    Current Medications (verified) Outpatient Encounter Medications as of 05/30/2022  Medication Sig   clobetasol cream (TEMOVATE) 00.93% Apply 1 application topically 2 (two) times daily as needed.   diclofenac sodium (VOLTAREN) 1 % GEL Apply 1 application topically as needed (pain).   diltiazem (CARDIZEM CD) 120 MG 24 hr capsule Take 1 capsule (120 mg total) by mouth daily.   EPINEPHrine (EPIPEN JR) 0.15 MG/0.3ML injection Inject 0.15 mg into the muscle as needed for anaphylaxis.   folic acid (FOLVITE) 1 MG tablet TAKE 1 TABLET(1 MG) BY MOUTH DAILY   magic mouthwash (lidocaine, diphenhydrAMINE, alum & mag hydroxide) suspension Swish and swallow 5 mLs 4 (four) times daily as needed for mouth pain.   methotrexate 50  MG/2ML injection ADMINISTER 0.6 ML(15 MG) UNDER THE SKIN 1 TIME A WEEK   methylcellulose (ARTIFICIAL TEARS) 1 % ophthalmic solution Place 1 drop into both eyes daily as needed. Dry Eyes   Misc Natural Products (GLUCOSAMINE CHONDROITIN TRIPLE) TABS Take 1 tablet by mouth 2 (two) times daily.   Multiple Vitamin (MULTIVITAMIN WITH MINERALS) TABS tablet Take 1 tablet by mouth daily.   Omega-3 Fatty Acids (FISH OIL) 1000 MG CAPS Take 2,000 mg by mouth daily.   rizatriptan (MAXALT) 10 MG tablet Take 1 tablet (10 mg total) by mouth as needed for migraine. May repeat in 2 hours if needed   topiramate (TOPAMAX) 25 MG tablet TAKE 2 TABLETS(50 MG) BY MOUTH TWICE DAILY   TUBERCULIN SYR 1CC/26GX3/8" 26G X 3/8" 1 ML MISC For injection of subcutaneous methotrexate once weekly   No facility-administered encounter medications on file as of 05/30/2022.    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[place])    Myositis    Ocular migraine    Palpitations    yrs ago - only one episode r/t stress   PVC (premature ventricular contraction)    yrs ago - only one episode   Scoliosis    Seizures ([place])    unknown etiolgy; no meds, last seizure was in 2014   Smoker 11/30/2015   quit 2015   Spondylosis    Past Surgical History:  Procedure Laterality Date   DILATION AND CURETTAGE OF UTERUS  11/18/1996   DILITATION & CURRETTAGE/HYSTROSCOPY WITH NOVASURE ABLATION N/A 01/16/2016   Procedure: HYSTEROSCOPY WITH NOVASURE ABLATION;  Surgeon: Jonnie Kind, MD;  Location: [facility];  Service: Gynecology;  Laterality: N/A;  Uterine Cavity Length 4.0 cm Uterine Cavity Width 3.3cm Power 73   Time 1 minute 17seconds   HIP SURGERY Left 2021   LAPAROSCOPIC BILATERAL SALPINGECTOMY Bilateral 01/16/2016   Procedure: LAPAROSCOPIC BILATERAL SALPINGECTOMY;  Surgeon: Jonnie Kind, MD;  Location: [facility];  Service: Gynecology;  Laterality: Bilateral;  procedure 1   OOPHORECTOMY  11/18/2004   Left side   SHOULDER ARTHROSCOPY Left 06/04/2021   Left shoulder arthroscopy with biceps tenodesis mini open rotator cuff tear repair   SHOULDER ARTHROSCOPY Left 09/04/2021   Procedure: LEFT SHOULDER MANIPULATION UNDER ANESTHESIA, ARTHROSCOPY WITH ROTATOR INTERVAL RELEASE;  Surgeon: Meredith Pel, MD;  Location: [place];  Service: Orthopedics;  Laterality: Left;   Family History  Problem Relation Age of Onset   Multiple sclerosis Mother    Heart disease Father    Kidney disease Father    Alcohol abuse Brother    Mental retardation Brother    Schizophrenia Brother    Other Brother        back problems   ALS Brother    Colon cancer Maternal Grandmother    Healthy Son    Healthy Son    Social History   Socioeconomic History   Marital status: Married    Spouse name: [name]   Number of children: 2   Years of education: Not on file   Highest education level: Not on file  Occupational History   Not on file  Tobacco Use   Smoking status: Former    Packs/day: 1.00    Years: 20.00    Total pack years: 20.00    Types: Cigarettes    Quit date: 09/12/2016    Years since quitting: 5.7   Smokeless tobacco: Never  Vaping Use   Vaping Use: Every day   Substances: Nicotine, CBD, Flavoring   Devices: [employer]  Substance and Sexual Activity   Alcohol use: Yes    Comment: socially   Drug use: Never   Sexual activity: Yes    Birth control/protection: Surgical    Comment: tubal ligation and ablation  Other Topics Concern   Not on file  Social History Narrative   2 sons, Truddie Crumble and Stanton.   Social Determinants of Health   Financial Resource Strain: Low Risk  (05/30/2022)    Overall Financial Resource Strain (CARDIA)    Difficulty of Paying Living Expenses: Not hard at all  Food Insecurity: No Food Insecurity (05/30/2022)   Hunger Vital Sign    Worried About Running Out of Food in the Last Year: Never true    Ran Out of Food in the Last Year: Never true  Transportation Needs: No Transportation Needs (05/30/2022)   PRAPARE - Hydrologist (Medical): No  Lack of Transportation (Non-Medical): No  Physical Activity: Sufficiently Active (05/30/2022)   Exercise Vital Sign    Days of Exercise per Week: 5 days    Minutes of Exercise per Session: 30 min  Recent Concern: Physical Activity - Insufficiently Active (04/03/2022)   Exercise Vital Sign    Days of Exercise per Week: 3 days    Minutes of Exercise per Session: 30 min  Stress: No Stress Concern Present (05/30/2022)   [place]    Feeling of Stress : Not at all  Recent Concern: Stress - Stress Concern Present (04/03/2022)   [place]    Feeling of Stress : To some extent  Social Connections: Moderately Integrated (05/30/2022)   Social Connection and Isolation Panel [NHANES]    Frequency of Communication with Friends and Family: More than three times a week    Frequency of Social Gatherings with Friends and Family: More than three times a week    Attends Religious Services: Never    Marine scientist or Organizations: Yes    Attends Music therapist: More than 4 times per year    Marital Status: Married    Tobacco Counseling Counseling given: Not Answered   Clinical Intake:  Pre-visit preparation completed: Yes  Pain : 0-10 Pain Score: 8  Pain Type: Chronic pain Pain Location: Shoulder Pain Descriptors / Indicators: Aching, Discomfort Pain Onset: More than a month ago Pain Frequency: Intermittent     BMI - recorded:  21.87 Nutritional Status: BMI of 19-24  Normal Nutritional Risks: None Diabetes: No  How often do you need to have someone help you when you read instructions, pamphlets, or other written materials from your doctor or pharmacy?: 1 - Never  Diabetic?NO  Interpreter Needed?: No  Information entered by :: mj Bransyn Adami,l pn   Activities of Daily Living    05/30/2022   11:39 AM 09/04/2021    1:14 PM  In your present state of health, do you have any difficulty performing the following activities:  Hearing? 0 0  Vision? 0 0  Difficulty concentrating or making decisions? 0 0  Walking or climbing stairs? 0 0  Dressing or bathing? 0 0  Doing errands, shopping? 0   Preparing Food and eating ? N   Using the Toilet? N   In the past six months, have you accidently leaked urine? N   Do you have problems with loss of bowel control? N   Managing your Medications? N   Managing your Finances? N   Housekeeping or managing your Housekeeping? N     Patient Care Team: Susy Frizzle, MD as PCP - General (Family Medicine) Werner Lean, MD as PCP - Cardiology (Cardiology) Fay Records, MD as Consulting Physician (Cardiology)  Indicate any recent Medical Services you may have received from other than [facility] providers in the past year (date may be approximate).     Assessment:   This is a routine wellness examination for Anndee.  Hearing/Vision screen Hearing Screening - Comments:: Some hearing issues. Vision Screening - Comments:: Contacts. Dr. Jorja Loa. 2022.  Dietary issues and exercise activities discussed: Current Exercise Habits: Home exercise routine, Type of exercise: walking, Time (Minutes): 30, Frequency (Times/Week): 5, Weekly Exercise (Minutes/Week): 150, Intensity: Mild, Exercise limited by: cardiac condition(s);respiratory conditions(s);orthopedic condition(s)   Goals Addressed             This Visit's Progress  Exercise 3x per week (30 min per time)        Continue to stay active and healthy. Would like to work part time.       Depression Screen    05/30/2022   11:33 AM 04/03/2022   11:28 AM 08/05/2021   11:22 AM 09/10/2019    2:05 PM 07/29/2019    2:55 PM 01/09/2018   11:29 AM 11/14/2017   11:33 AM  PHQ 2/9 Scores  PHQ - 2 Score 0 0 0 0 0 0 0  PHQ- 9 Score  2         Fall Risk    05/30/2022   11:37 AM 04/03/2022   11:33 AM 08/05/2021   11:21 AM 09/10/2019    2:04 PM 07/29/2019    2:55 PM  Fall Risk   Falls in the past year? 0 0 0 0 0  Number falls in past yr: 0 0 0 0   Injury with Fall? 0 0 0    Risk for fall due to : No Fall Risks No Fall Risks No Fall Risks    Follow up Falls prevention discussed  Education provided;Falls prevention discussed;Falls evaluation completed Falls evaluation completed     FALL RISK PREVENTION PERTAINING TO THE HOME:  Any stairs in or around the home? Yes  If so, are there any without handrails? No  Home free of loose throw rugs in walkways, pet beds, electrical cords, etc? Yes  Adequate lighting in your home to reduce risk of falls? Yes   ASSISTIVE DEVICES UTILIZED TO PREVENT FALLS:  Life alert? No  Use of a cane, walker or w/c? No  Grab bars in the bathroom? No  Shower chair or bench in shower? Yes  Elevated toilet seat or a handicapped toilet? No   TIMED UP AND GO:  Was the test performed? Yes .  Phone visit.  Cognitive Function:        05/30/2022   11:40 AM 08/05/2021   11:18 AM  6CIT Screen  What Year? 0 points 0 points  What month? 0 points 0 points  What time? 0 points 0 points  Count back from 20 0 points 0 points  Months in reverse 0 points 0 points  Repeat phrase 2 points 8 points  Total Score 2 points 8 points    Immunizations Immunization History  Administered Date(s) Administered   Influenza Whole 07/19/2012   Influenza,inj,Quad PF,6+ Mos 09/15/2015, 10/17/2017   Influenza-Unspecified 08/30/2011   Moderna Sars-Covid-2 Vaccination 08/26/2020, 09/23/2020    Tdap 03/01/2011    TDAP status: Due, Education has been provided regarding the importance of this vaccine. Advised may receive this vaccine at local pharmacy or Health Dept. Aware to provide a copy of the vaccination record if obtained from local pharmacy or Health Dept. Verbalized acceptance and understanding.  Flu Vaccine status: Due, Education has been provided regarding the importance of this vaccine. Advised may receive this vaccine at local pharmacy or Health Dept. Aware to provide a copy of the vaccination record if obtained from local pharmacy or Health Dept. Verbalized acceptance and understanding.  Pneumococcal vaccine status: Up to date  Covid-19 vaccine status: Completed vaccines  Qualifies for Shingles Vaccine? No   Zostavax completed No   Shingrix Completed?: No.    Education has been provided regarding the importance of this vaccine. Patient has been advised to call insurance company to determine out of pocket expense if they have not yet received this vaccine. Advised may also receive vaccine  at local pharmacy or Health Dept. Verbalized acceptance and understanding.  Screening Tests Health Maintenance  Topic Date Due   COVID-19 Vaccine (3 - Moderna risk series) 06/15/2022 (Originally 10/21/2020)   HIV Screening  09/16/2022 (Originally 02/10/1996)   TETANUS/TDAP  09/17/2022 (Originally 02/28/2021)   INFLUENZA VACCINE  06/18/2022   PAP SMEAR-Modifier  04/03/2025   Hepatitis C Screening  Completed   HPV VACCINES  Aged Out    Health Maintenance  There are no preventive care reminders to display for this patient.   Colorectal cancer screening: Referral to GI placed Due at age 34.[name] Pt aware the office will call re: appt.  Mammogram status: Completed 02/18/2022. Repeat every year  Bone Density status: Ordered due at age 42. Pt provided with contact info and advised to call to schedule appt.  Lung Cancer Screening: (Low Dose CT Chest recommended if Age 34-80 years, 30  pack-year currently smoking OR have quit w/in 15years.) does not qualify.  Additional Screening:  Hepatitis C Screening: does not qualify; Completed 07/10/2021.  Vision Screening: Recommended annual ophthalmology exams for early detection of glaucoma and other disorders of the eye. Is the patient up to date with their annual eye exam?  Yes  Who is the provider or what is the name of the office in which the patient attends annual eye exams? Dr. Jorja Loa If pt is not established with a provider, would they like to be referred to a provider to establish care? No .   Dental Screening: Recommended annual dental exams for proper oral hygiene  Community Resource Referral / Chronic Care Management: CRR required this visit?  No   CCM required this visit?  No      Plan:     I have personally reviewed and noted the following in the patient's chart:   Medical and social history Use of alcohol, tobacco or illicit drugs  Current medications and supplements including opioid prescriptions. Patient is not currently taking opioid prescriptions. Functional ability and status Nutritional status Physical activity Advanced directives List of other physicians Hospitalizations, surgeries, and ER visits in previous 12 months Vitals Screenings to include cognitive, depression, and falls Referrals and appointments  In addition, I have reviewed and discussed with patient certain preventive protocols, quality metrics, and best practice recommendations. A written personalized care plan for preventive services as well as general preventive health recommendations were provided to patient.     Chriss Driver, LPN   6/50/3546   Nurse Notes: Pt is up to date on age appropriate screenings and vaccines.

## 2022-06-06 DIAGNOSIS — M25512 Pain in left shoulder: Secondary | ICD-10-CM | POA: Diagnosis not present

## 2022-06-06 DIAGNOSIS — M542 Cervicalgia: Secondary | ICD-10-CM | POA: Diagnosis not present

## 2022-06-14 ENCOUNTER — Encounter: Payer: Self-pay | Admitting: Emergency Medicine

## 2022-06-14 ENCOUNTER — Ambulatory Visit
Admission: EM | Admit: 2022-06-14 | Discharge: 2022-06-14 | Disposition: A | Discharge status: Home / Self Care | Payer: Medicare Other | Attending: Family Medicine | Admitting: Family Medicine

## 2022-06-14 DIAGNOSIS — R42 Dizziness and giddiness: Secondary | ICD-10-CM

## 2022-06-14 LAB — POCT FASTING CBG KUC MANUAL ENTRY: POCT Glucose (KUC): 78 mg/dL (ref 70–99)

## 2022-06-14 MED ORDER — MECLIZINE HCL 25 MG PO TABS: 25.0000 mg | ORAL_TABLET | Freq: Three times a day (TID) | ORAL | 0 refills | Status: DC | PRN

## 2022-06-14 NOTE — ED Triage Notes (Signed)
Dizziness that started around 7am.  Also c/o of stiff neck

## 2022-06-14 NOTE — ED Provider Notes (Signed)
[facility]    CSN: [id number] Arrival date & time: 06/14/22  1125      History   Chief Complaint No chief complaint on file.   HPI Theresa Washington is a 41 y.o. female.   Patient presenting today with room spinning dizziness that started around 7 in the morning this morning when she woke up.  She states the episodes are brief and self resolving.  She notes it seems worse when she bends over to pick something up and stands back up, better when she is still.  Denies associated nausea, vomiting, visual changes, headache, palpitations, chest pain, shortness of breath, loss of consciousness, head injury.  Notes she has never had anything like this happen in the past.  Does have a history of SVT followed by cardiology but states this feels very different and she is consistent with her diltiazem.    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[place])    Myositis    Ocular migraine    Palpitations    yrs ago - only one episode r/t stress   PVC (premature ventricular contraction)    yrs ago - only one episode   Scoliosis    Seizures ([place])    unknown etiolgy; no meds, last seizure was in 2014   Smoker 11/30/2015   quit 2015   Spondylosis     Patient Active Problem List   Diagnosis Date Noted   Screening for diabetes mellitus 04/03/2022   Encounter for screening fecal occult blood testing 04/03/2022   Encounter for gynecological examination with Papanicolaou smear of cervix 04/03/2022   Mouth ulcers 03/27/2022   Livedo reticularis 01/08/2022   Rash and other nonspecific skin eruption 11/30/2021   Adhesive capsulitis of left shoulder    Trochanteric bursitis, left hip 08/31/2021   Bilateral knee pain  08/01/2021   Rheumatoid arthritis with rheumatoid factor of multiple sites without organ or systems involvement ([place]) 07/10/2021   High risk medication use 07/10/2021   Essential hypertension 11/24/2020   Nonrheumatic mitral valve regurgitation 11/24/2020   Sacroiliac joint pain 01/12/2020   Smoker 11/30/2015   Palpitations 04/27/2014   Vasovagal syncope 04/26/2014   Spondylosis 02/24/2014   Hip pain 12/12/2013   Fibromyalgia 02/10/2013   Somatization disorder 02/10/2013   Pseudoseizures 02/10/2013   DDD (degenerative disc disease), lumbosacral 02/10/2013   Spells 02/25/2012   History of idiopathic seizure 10/14/2011   SMOKER 05/20/2010   COPD (chronic obstructive pulmonary disease) ([place]) 05/20/2010   SHOULDER PAIN 10/27/2008   NECK PAIN 10/27/2008    Past Surgical History:  Procedure Laterality Date   DILATION AND CURETTAGE OF UTERUS  11/18/1996   DILITATION & CURRETTAGE/HYSTROSCOPY WITH NOVASURE ABLATION N/A 01/16/2016   Procedure: HYSTEROSCOPY WITH NOVASURE ABLATION;  Surgeon: Jonnie Kind, MD;  Location: [facility];  Service: Gynecology;  Laterality: N/A;  Uterine Cavity Length 4.0 cm Uterine Cavity Width 3.3cm Power 73  Time 1 minute 17seconds   HIP SURGERY Left 2021   LAPAROSCOPIC BILATERAL SALPINGECTOMY Bilateral 01/16/2016   Procedure: LAPAROSCOPIC BILATERAL SALPINGECTOMY;  Surgeon: Jonnie Kind, MD;  Location: [facility];  Service: Gynecology;  Laterality: Bilateral;  procedure 1   OOPHORECTOMY  11/18/2004   Left side   SHOULDER ARTHROSCOPY Left 06/04/2021   Left shoulder arthroscopy with biceps tenodesis mini open rotator cuff tear repair   SHOULDER ARTHROSCOPY Left 09/04/2021   Procedure: LEFT SHOULDER MANIPULATION UNDER ANESTHESIA, ARTHROSCOPY WITH ROTATOR INTERVAL RELEASE;  Surgeon: Meredith Pel, MD;  Location: [place];  Service: Orthopedics;  Laterality: Left;    OB History     Gravida  2   Para  2   Term      Preterm      AB      Living  2       SAB      IAB      Ectopic      Multiple      Live Births               Home Medications    Prior to Admission medications   Medication Sig Start Date End Date Taking? Authorizing Provider  meclizine (ANTIVERT) 25 MG tablet Take 1 tablet (25 mg total) by mouth 3 (three) times daily as needed for dizziness. May cause drowsiness 06/14/22  Yes Volney American, PA-C  clobetasol cream (TEMOVATE) 4.69 % Apply 1 application topically 2 (two) times daily as needed. 11/21/21   Rice, Resa Miner, MD  diclofenac sodium (VOLTAREN) 1 % GEL Apply 1 application topically as needed (pain).    [provider]  diltiazem (CARDIZEM CD) 120 MG 24 hr capsule Take 1 capsule (120 mg total) by mouth daily. 04/04/22   Bhagat, Crista Luria, PA  EPINEPHrine (EPIPEN JR) 0.15 MG/0.3ML injection Inject 0.15 mg into the muscle as needed for anaphylaxis.    [provider]  folic acid (FOLVITE) 1 MG tablet TAKE 1 TABLET(1 MG) BY MOUTH DAILY 03/10/22   Rice, Resa Miner, MD  magic mouthwash (lidocaine, diphenhydrAMINE, alum & mag hydroxide) suspension Swish and swallow 5 mLs 4 (four) times daily as needed for mouth pain. 04/19/22   Collier Salina, MD  methotrexate 50 MG/2ML injection ADMINISTER 0.6 ML(15 MG) UNDER THE SKIN 1 TIME A WEEK 05/15/22   Rice, Resa Miner, MD  methylcellulose (ARTIFICIAL TEARS) 1 % ophthalmic solution Place 1 drop into both eyes daily as needed. Dry Eyes    [provider]  Misc Natural Products (GLUCOSAMINE CHONDROITIN TRIPLE) TABS Take 1 tablet by mouth 2 (two) times daily.    [provider]  Multiple Vitamin (MULTIVITAMIN WITH MINERALS) TABS tablet Take 1 tablet by mouth daily.    [provider]  Omega-3 Fatty Acids (FISH OIL) 1000 MG CAPS Take 2,000 mg by mouth daily.    [provider]  rizatriptan (MAXALT) 10 MG tablet Take 1 tablet (10 mg total) by mouth as needed for migraine. May repeat in 2 hours if needed  08/06/21   Susy Frizzle, MD  topiramate (TOPAMAX) 25 MG tablet TAKE 2 TABLETS(50 MG) BY MOUTH TWICE DAILY 03/18/22   Susy Frizzle, MD  TUBERCULIN SYR 1CC/26GX3/8" 26G X 3/8" 1 ML MISC For injection of subcutaneous methotrexate once weekly 01/12/22   Rice, Resa Miner, MD    Family History Family History  Problem Relation Age of Onset   Multiple sclerosis Mother    Heart disease Father    Kidney disease Father    Alcohol abuse Brother    Mental retardation Brother    Schizophrenia Brother    Other Brother        back problems  ALS Brother    Colon cancer Maternal Grandmother    Healthy Son    Healthy Son     Social History Social History   Tobacco Use   Smoking status: Former    Packs/day: 1.00    Years: 20.00    Total pack years: 20.00    Types: Cigarettes    Quit date: 09/12/2016    Years since quitting: 5.7   Smokeless tobacco: Never  Vaping Use   Vaping Use: Every day   Substances: Nicotine, CBD, Flavoring   Devices: [employer]  Substance Use Topics   Alcohol use: Yes    Comment: socially   Drug use: Never     Allergies   Shrimp [shellfish allergy] and Other   Review of Systems Review of Systems PER HPI  Physical Exam Triage Vital Signs ED Triage Vitals  Enc Vitals Group     BP 06/14/22 1149 (!) 143/87     Pulse Rate 06/14/22 1149 72     Resp 06/14/22 1149 18     Temp 06/14/22 1149 97.8 F (36.6 C)     Temp Source 06/14/22 1149 Oral     SpO2 06/14/22 1149 98 %     Weight --      Height --      Head Circumference --      Peak Flow --      Pain Score 06/14/22 1151 5     Pain Loc --      Pain Edu? --      Excl. in [place]? --    No data found.  Updated Vital Signs BP (!) 143/87 (BP Location: Right Arm)   Pulse 72   Temp 97.8 F (36.6 C) (Oral)   Resp 18   SpO2 98%   Visual Acuity Right Eye Distance:   Left Eye Distance:   Bilateral Distance:    Right Eye Near:   Left Eye Near:    Bilateral Near:     Physical  Exam Vitals and nursing note reviewed.  Constitutional:      Appearance: Normal appearance. She is not ill-appearing.  HENT:     Head: Atraumatic.     Right Ear: Tympanic membrane normal.     Left Ear: Tympanic membrane normal.     Mouth/Throat:     Mouth: Mucous membranes are moist.  Eyes:     Extraocular Movements: Extraocular movements intact.     Conjunctiva/sclera: Conjunctivae normal.     Pupils: Pupils are equal, round, and reactive to light.  Cardiovascular:     Rate and Rhythm: Normal rate and regular rhythm.     Heart sounds: Normal heart sounds.  Pulmonary:     Effort: Pulmonary effort is normal.     Breath sounds: Normal breath sounds.  Musculoskeletal:        General: Normal range of motion.     Cervical back: Normal range of motion and neck supple.  Skin:    General: Skin is warm and dry.  Neurological:     General: No focal deficit present.     Mental Status: She is alert and oriented to person, place, and time.     Cranial Nerves: No cranial nerve deficit.     Sensory: No sensory deficit.     Motor: No weakness.     Gait: Gait normal.  Psychiatric:        Mood and Affect: Mood normal.        Thought Content: Thought content normal.  Judgment: Judgment normal.    UC Treatments / Results  Labs (all labs ordered are listed, but only abnormal results are displayed) Labs Reviewed  POCT FASTING [facility] [place]   EKG   Radiology No results found.  Procedures Procedures (including critical care time)  Medications Ordered in UC Medications - No data to display  Initial Impression / Assessment and Plan / UC Course  I have reviewed the triage vital signs and the nursing notes.  Pertinent labs & imaging results that were available during my care of the patient were reviewed by me and considered in my medical decision making (see chart for details).     Minimally hypertensive in triage, otherwise vital signs benign and reassuring today.   Her exam is very reassuring with no abnormal findings.  She declines majority of work-up offered, does have a known history of SVT but states this feels very different than her past episodes and she has not felt any palpitations so does not feel that it is cardiac.  She is followed by cardiology for this issue.  Symptoms most consistent with vertigo, treat with meclizine, Epley maneuvers, slow delivery of movements and close follow-up with PCP.  Return for any worsening symptoms.  Final Clinical Impressions(s) / UC Diagnoses   Final diagnoses:  Vertigo   Discharge Instructions   None    ED Prescriptions     Medication Sig Dispense Auth. Provider   meclizine (ANTIVERT) 25 MG tablet Take 1 tablet (25 mg total) by mouth 3 (three) times daily as needed for dizziness. May cause drowsiness 30 tablet Volney American, [place]      PDMP not reviewed this encounter.   Volney American, [place] 06/14/22 1249

## 2022-06-20 NOTE — Progress Notes (Signed)
Office Visit Note  [name]             Date of Birth: May 24, 1981           MRN: [id number]             PCP: Susy Frizzle, MD Referring: Susy Frizzle, MD Visit Date: 07/03/2022   Subjective:  Follow-up (Skin bumps/rash. )   History of Present Illness: Theresa Washington is a 41 y.o. female here for follow up for follow up for seropositive RA on methotrexate 15 mg subcu weekly.  Joint symptoms have been doing fairly well still having some periodic worsening with hip pains but no severe flareup in several weeks.  She is noticing a bit more of hand stiffness but not limiting her activity and no visible swelling.  She has been noticing ongoing rash with small raised red bumps.  These improved when treated with a topical steroid but keep coming back.  Most recently seeing these on both arms and legs seem to be popping up in a linear pattern every few inches up the entire extremity.  Previous HPI 03/27/2022  Theresa Washington is a 41 y.o. female here for follow up for joint pains suspected seropositive RA after starting methotrexate 0.63m once weekly and folic acid 1 mg daily. She has had some flare ups of hip bursitis but frequency is significantly less than before. Last bad episode was with right hip pain, she associates this with increased use doing spring garden and landscape work. Skin rashes are partially better around her neck and improve with topical clobetasol as needed. Since about 2 days ago she developed painful sores on the right lower part of her mouth. These are worst whenever eating. Pain also affecting her throat and has tenderness to pressure along the bottom of her jaw on right side. She does not recall any preceding event or change besides eating chocolate before this started. She has a history of infrequent cold sores in the past but never in this distribution these usually get better on their own after a few days.   Previous  HPI 01/08/2022 Theresa CHEONGis a 41y.o. female here for follow up for joint pains suspected seropositive RA after starting methotrexate 15 mg PO weekly and folic acid 1 mg daily in December. She suffered some nausea with the medication. She also developed skin rashes in the interval and recommended short term steroids for this. The rash was very itchy but cleared up with topical steroids. She had another episode of bilateral hip and sacral pain lasting 2 weeks that ended 2 days ago without obvious trigger or treatment. Methotrexate is causing significant nausea on the day of treatment and up to 2 days following.   Previous HPI 10/31/21 Theresa MULLINAXis a 41y.o. female here for follow up with multiple joint pains and positive RF Abs most recent visit with lateral hip pain increase treated with a repeat trial of prednisone taper.  Symptoms improved again very well but return pretty quickly after discontinuing steroids.  She has left shoulder adhesive capsulitis treatment manipulation under anesthesia with Dr. DMarlou Sa  Despite this subsequent work with physical therapy they have expressed concern for abnormal incomplete healing still with limited range of motion in the left shoulder.  She saw ophthalmology with abnormal findings exam with retina specialist report reviewed identifying retina schisis in the right eye, bilateral dry eyes on artificial tears and Restasis, and findings consistent with atypical  ocular migraine.   Previous HPI 07/10/21 Theresa Washington is a 41 y.o. female here for chronic joint of multiple sites with positive rheumatoid factor. She has history of left shoulder arthroscopy for rotator cuff tear and let hip arthroscopy for labral tear. Joint pains are mostly chronic worst in the shoulders, elbows, hips, and knees bilaterally. These were evaluated years ago with negative serology and attributed to her injury changes and fibromyalgia syndrome. She previously developed high  increases in inflammatory markers despite negative antibody tests. Pain has gotten worse some in the shoulders and hips treated with orthopedics but now also more pain and swelling in elbows. She is stiff for more than an hour in mornings but also has worsening pain with heavier use. She gets the most relief from prednisone and has taken intermittent short tapering doses with good relief but when recurrence afterwards. Individuals flares or episodes lasting about 7-10 days typically. Most recent treatment finished 2 days ago.   Review of Systems  Constitutional:  Positive for fatigue.  HENT:  Positive for mouth dryness. Negative for mouth sores.   Eyes:  Positive for dryness.  Respiratory:  Negative for shortness of breath.   Cardiovascular:  Positive for palpitations. Negative for chest pain.  Gastrointestinal:  Negative for blood in stool, constipation and diarrhea.  Endocrine: Negative for increased urination.  Genitourinary:  Negative for involuntary urination.  Musculoskeletal:  Positive for joint pain, joint pain, joint swelling and morning stiffness. Negative for gait problem, myalgias, muscle weakness, muscle tenderness and myalgias.  Skin:  Positive for rash. Negative for color change, hair loss and sensitivity to sunlight.  Allergic/Immunologic: Negative for susceptible to infections.  Neurological:  Positive for headaches. Negative for dizziness.  Hematological:  Negative for swollen glands.  Psychiatric/Behavioral:  Negative for depressed mood and sleep disturbance. The patient is not nervous/anxious.     PMFS History:  Patient Active Problem List   Diagnosis Date Noted   Screening for diabetes mellitus 04/03/2022   Encounter for screening fecal occult blood testing 04/03/2022   Encounter for gynecological examination with Papanicolaou smear of cervix 04/03/2022   Mouth ulcers 03/27/2022   Livedo reticularis 01/08/2022   Rash and other nonspecific skin eruption 11/30/2021    Adhesive capsulitis of left shoulder    Trochanteric bursitis, left hip 08/31/2021   Bilateral knee pain 08/01/2021   Rheumatoid arthritis with rheumatoid factor of multiple sites without organ or systems involvement ([place]) 07/10/2021   High risk medication use 07/10/2021   Essential hypertension 11/24/2020   Nonrheumatic mitral valve regurgitation 11/24/2020   Sacroiliac joint pain 01/12/2020   Smoker 11/30/2015   Palpitations 04/27/2014   Vasovagal syncope 04/26/2014   Spondylosis 02/24/2014   Hip pain 12/12/2013   Fibromyalgia 02/10/2013   Somatization disorder 02/10/2013   Pseudoseizures 02/10/2013   DDD (degenerative disc disease), lumbosacral 02/10/2013   Spells 02/25/2012   History of idiopathic seizure 10/14/2011   SMOKER 05/20/2010   COPD (chronic obstructive pulmonary disease) ([place]) 05/20/2010   SHOULDER PAIN 10/27/2008   NECK PAIN 10/27/2008    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[place])  Myositis    Ocular migraine    Palpitations    yrs ago - only one episode r/t stress   PVC (premature ventricular contraction)    yrs ago - only one episode   Scoliosis    Seizures ([place])    unknown etiolgy; no meds, last seizure was in 2014   Smoker 11/30/2015   quit 2015   Spondylosis     Family History  Problem Relation Age of Onset   Multiple sclerosis Mother    Heart disease Father    Kidney disease Father    Alcohol abuse Brother    Mental retardation Brother    Schizophrenia Brother    Other Brother        back problems   ALS Brother    Colon cancer Maternal Grandmother    Healthy Son    Healthy Son    Past Surgical History:  Procedure Laterality Date   DILATION AND CURETTAGE OF UTERUS   11/18/1996   DILITATION & CURRETTAGE/HYSTROSCOPY WITH NOVASURE ABLATION N/A 01/16/2016   Procedure: HYSTEROSCOPY WITH NOVASURE ABLATION;  Surgeon: Jonnie Kind, MD;  Location: [facility];  Service: Gynecology;  Laterality: N/A;  Uterine Cavity Length 4.0 cm Uterine Cavity Width 3.3cm Power 73  Time 1 minute 17seconds   HIP SURGERY Left 2021   LAPAROSCOPIC BILATERAL SALPINGECTOMY Bilateral 01/16/2016   Procedure: LAPAROSCOPIC BILATERAL SALPINGECTOMY;  Surgeon: Jonnie Kind, MD;  Location: [facility];  Service: Gynecology;  Laterality: Bilateral;  procedure 1   OOPHORECTOMY  11/18/2004   Left side   SHOULDER ARTHROSCOPY Left 06/04/2021   Left shoulder arthroscopy with biceps tenodesis mini open rotator cuff tear repair   SHOULDER ARTHROSCOPY Left 09/04/2021   Procedure: LEFT SHOULDER MANIPULATION UNDER ANESTHESIA, ARTHROSCOPY WITH ROTATOR INTERVAL RELEASE;  Surgeon: Meredith Pel, MD;  Location: [facility];  Service: Orthopedics;  Laterality: Left;   Social History   Social History Narrative   2 sons, Truddie [place]rum[place] and [place].   Immunization History  Administered Date(s) Administered   Influenza Whole 07/19/2012   Influenza,inj,Quad PF,6+ Mos 09/15/2015, 10/17/2017   Influenza-Unspecified 08/30/2011   Moderna Sars-Covid-2 Vaccination 08/26/2020, 09/23/2020   Tdap 03/01/2011     Objective: Vital Signs: BP 117/73 (BP Location: Left Arm, Patient Position: Sitting, Cuff Size: Normal)   Pulse 69   Resp 15   Ht 5' (1.524 m)   Wt 111 lb 3.2 oz (50.4 kg)   BMI 21.72 kg/m    Physical Exam Cardiovascular:     Rate and Rhythm: Normal rate and regular rhythm.  Pulmonary:     Effort: Pulmonary effort is normal.     Breath sounds: Normal breath sounds.  Musculoskeletal:     Right lower leg: No edema.     Left lower leg: No edema.  Skin:    General: Skin is warm and dry.     Findings: Rash present.     Comments: Faintly erythematous papules approximately 3 to 4 mm diameter on arms and legs  extensor surfaces several inches between each individual spot, no surrounding induration and no excoriations  Neurological:     Mental Status: She is alert.  Psychiatric:        Mood and Affect: Mood normal.      Musculoskeletal Exam:  Neck full ROM no tenderness Shoulders full ROM no tenderness or swelling Elbows full ROM no tenderness or swelling Wrists full ROM no tenderness or swelling Fingers full ROM no tenderness or swelling Knees full ROM no tenderness or  swelling   CDAI Exam: CDAI Score: 3  Patient Global: 20 mm; Provider Global: 10 mm Swollen: 0 ; Tender: 0  Joint Exam 07/03/2022   All documented joints were normal     Investigation: No additional findings.  Imaging: No results found.  Recent Labs: Lab Results  Component Value Date   WBC 5.3 07/03/2022   HGB 13.8 07/03/2022   PLT 320 07/03/2022   NA 137 07/03/2022   K 4.3 07/03/2022   CL 106 07/03/2022   CO2 23 07/03/2022   GLUCOSE 80 07/03/2022   BUN 14 07/03/2022   CREATININE 0.92 07/03/2022   BILITOT 0.5 07/03/2022   ALKPHOS 80 09/01/2015   AST 11 07/03/2022   ALT 11 07/03/2022   PROT 7.2 07/03/2022   ALBUMIN 4.0 09/01/2015   CALCIUM 9.6 07/03/2022   GFRAA 106 10/24/2020   QFTBGOLDPLUS NEGATIVE 07/10/2021    Speciality Comments: No specialty comments available.  Procedures:  No procedures performed Allergies: Shrimp [shellfish allergy] and Other   Assessment / Plan:     Visit Diagnoses: Rheumatoid arthritis with rheumatoid factor of multiple sites without organ or systems involvement ([place]) - Plan: Sedimentation rate  Joint symptoms appear overall well controlled I do not see any active inflammatory changes affecting the hands and wrists.  Hip flareups still an ongoing problem but equal or less since previous follow-up.  Checking sedimentation rate for disease activity monitoring.  Plan to continue methotrexate 15 mg subcu weekly.  High risk medication use - Methotrexate ADMINISTER 0.6  ML(15 MG) UNDER THE SKIN 1 TIME A WEEK. - Plan: CBC with Differential/Platelet, COMPLETE METABOLIC PANEL WITH GFR  Checking CBC and CMP for methotrexate toxicity monitoring.  No longer experiencing nose or mouth ulcers may have been associated with the oral methotrexate previously.  Rash and other nonspecific skin eruption  Not sure about the new skin rashes these do not look typical as a side effect of methotrexate nor for rheumatoid nodules or vasculitis.  I believe she could benefit to see a dermatologist for evaluation as these are ongoing for multiple months now.  Livedo reticularis  Stable I do not see any ulcerations or lesions, erythematous papules do not appear to be in an associated distribution.    Orders: Orders Placed This Encounter  Procedures   Sedimentation rate   CBC with Differential/Platelet   COMPLETE METABOLIC PANEL WITH GFR   Ambulatory referral to Dermatology   No orders of the defined types were placed in this encounter.    Follow-Up Instructions: Return in about 3 months (around 10/03/2022) for RA on MTX f/u 20mo.   CCollier Salina MD  Note - This record has been created using [employer]  Chart creation errors have been sought, but may not always  have been located. Such creation errors do not reflect on  the standard of medical care.

## 2022-07-03 ENCOUNTER — Ambulatory Visit: Payer: Medicare Other | Attending: Internal Medicine | Admitting: Internal Medicine

## 2022-07-03 ENCOUNTER — Encounter: Payer: Self-pay | Admitting: Internal Medicine

## 2022-07-03 VITALS — BP 117/73 | HR 69 | Resp 15 | Ht 60.0 in | Wt 111.2 lb

## 2022-07-03 DIAGNOSIS — K121 Other forms of stomatitis: Secondary | ICD-10-CM

## 2022-07-03 DIAGNOSIS — M0579 Rheumatoid arthritis with rheumatoid factor of multiple sites without organ or systems involvement: Secondary | ICD-10-CM | POA: Diagnosis not present

## 2022-07-03 DIAGNOSIS — R231 Pallor: Secondary | ICD-10-CM | POA: Diagnosis not present

## 2022-07-03 DIAGNOSIS — R21 Rash and other nonspecific skin eruption: Secondary | ICD-10-CM | POA: Diagnosis not present

## 2022-07-03 DIAGNOSIS — Z79899 Other long term (current) drug therapy: Secondary | ICD-10-CM

## 2022-07-04 LAB — COMPLETE METABOLIC PANEL WITH GFR
AG Ratio: 1.9 (calc) (ref 1.0–2.5)
ALT: 11 U/L (ref 6–29)
AST: 11 U/L (ref 10–30)
Albumin: 4.7 g/dL (ref 3.6–5.1)
Alkaline phosphatase (APISO): 45 U/L (ref 31–125)
BUN: 14 mg/dL (ref 7–25)
CO2: 23 mmol/L (ref 20–32)
Calcium: 9.6 mg/dL (ref 8.6–10.2)
Chloride: 106 mmol/L (ref 98–110)
Creat: 0.92 mg/dL (ref 0.50–0.99)
Globulin: 2.5 g/dL (calc) (ref 1.9–3.7)
Glucose, Bld: 80 mg/dL (ref 65–99)
Potassium: 4.3 mmol/L (ref 3.5–5.3)
Sodium: 137 mmol/L (ref 135–146)
Total Bilirubin: 0.5 mg/dL (ref 0.2–1.2)
Total Protein: 7.2 g/dL (ref 6.1–8.1)
eGFR: 80 mL/min/{1.73_m2} (ref >=60)

## 2022-07-04 LAB — CBC WITH DIFFERENTIAL/PLATELET
Absolute Monocytes: 504 cells/uL (ref 200–950)
Basophils Absolute: 32 cells/uL (ref 0–200)
Basophils Relative: 0.6 %
Eosinophils Absolute: 42 cells/uL (ref 15–500)
Eosinophils Relative: 0.8 %
HCT: 40.5 % (ref 35.0–45.0)
Hemoglobin: 13.8 g/dL (ref 11.7–15.5)
Lymphs Abs: 1601 cells/uL (ref 850–3900)
MCH: 32.3 pg (ref 27.0–33.0)
MCHC: 34.1 g/dL (ref 32.0–36.0)
MCV: 94.8 fL (ref 80.0–100.0)
MPV: 9.5 fL (ref 7.5–12.5)
Monocytes Relative: 9.5 %
Neutro Abs: 3122 cells/uL (ref 1500–7800)
Neutrophils Relative %: 58.9 %
Platelets: 320 10*3/uL (ref 140–400)
RBC: 4.27 10*6/uL (ref 3.80–5.10)
RDW: 13 % (ref 11.0–15.0)
Total Lymphocyte: 30.2 %
WBC: 5.3 10*3/uL (ref 3.8–10.8)

## 2022-07-04 LAB — SEDIMENTATION RATE: Sed Rate: 11 mm/h (ref 0–20)

## 2022-07-04 NOTE — Progress Notes (Signed)
Her labs look fine for continuing the methotrexate at this time. Her sedimentation rate remains normal.

## 2022-07-08 ENCOUNTER — Ambulatory Visit: Payer: Medicare Other | Admitting: Orthopedic Surgery

## 2022-07-08 ENCOUNTER — Encounter: Payer: Self-pay | Admitting: Orthopedic Surgery

## 2022-07-08 DIAGNOSIS — M7502 Adhesive capsulitis of left shoulder: Secondary | ICD-10-CM | POA: Diagnosis not present

## 2022-07-08 NOTE — Progress Notes (Signed)
Office Visit Note   [name]           Date of Birth: 19-Nov-1980           MRN: [id number] Visit Date: 07/08/2022 Requested by: Susy Frizzle, MD [id number] [place],   [id number] PCP: Susy Frizzle, MD  Subjective: Chief Complaint  Patient presents with   Follow-up    HPI: [name] is a 41 year old patient with left shoulder pain.  Having some pain around the neck and scapular region.  Had rotator cuff repair 381 with manipulation 1022.  Has had some improvement since last clinic visit 6 weeks ago.  Mother has MS.  She caught her recently when she was falling.  Patient also has rheumatoid arthritis and takes methotrexate weekly.  She has some right shoulder issues but wants to make sure her left shoulder is fully functional moving forward with her medical diagnosis.  Has a history of epidural steroid injections in the spine as well.  Has also been on Celebrex.              ROS: All systems reviewed are negative as they relate to the chief complaint within the history of present illness.  Patient denies  fevers or chills.   Assessment & Plan: Visit Diagnoses:  1. Adhesive capsulitis of left shoulder     Plan: Impression is residual left shoulder pain with pretty reasonable range of motion.  Does not look like radicular pain at this time.  Rotator cuff strength is good.  At this time I would not really favor an injection unless absolutely necessary into the glenohumeral joint in the face of prior rotator cuff repair.  I think she has a reasonably strong and functional in the mobile shoulder.  Some of the symptoms could be related to rheumatoid arthritis.  Do not really think that any further orthopedic intervention is indicated at this time unless symptoms change.  Follow-up as needed  Follow-Up Instructions: No follow-ups on file.   Orders:  No orders of the defined types were placed in this encounter.  No orders of the defined types were  placed in this encounter.     Procedures: No procedures performed   Clinical Data: No additional findings.  Objective: Vital Signs: There were no vitals taken for this visit.  Physical Exam:   Constitutional: Patient appears well-developed HEENT:  Head: Normocephalic Eyes:EOM are normal Neck: Normal range of motion Cardiovascular: Normal rate Pulmonary/chest: Effort normal Neurologic: Patient is alert Skin: Skin is warm Psychiatric: Patient has normal mood and affect   Ortho Exam: Ortho exam demonstrates full active and passive range of motion of the cervical spine.  Left shoulder has range of motion of 50/100/175.  Rotator cuff strength is good infraspinatus supraspinatus and subscap muscle testing with no masses lymphadenopathy or skin changes noted in that shoulder girdle region.  Not too much coarse grinding or crepitus noted with internal and external rotation of the shoulder at 90 degrees of abduction.  Specialty Comments:  No specialty comments available.  Imaging: No results found.   PMFS History: Patient Active Problem List   Diagnosis Date Noted   Screening for diabetes mellitus 04/03/2022   Encounter for screening fecal occult blood testing 04/03/2022   Encounter for gynecological examination with Papanicolaou smear of cervix 04/03/2022   Mouth ulcers 03/27/2022   Livedo reticularis 01/08/2022   Rash and other nonspecific skin eruption 11/30/2021   Adhesive capsulitis of left shoulder  Trochanteric bursitis, left hip 08/31/2021   Bilateral knee pain 08/01/2021   Rheumatoid arthritis with rheumatoid factor of multiple sites without organ or systems involvement ([place]) 07/10/2021   High risk medication use 07/10/2021   Essential hypertension 11/24/2020   Nonrheumatic mitral valve regurgitation 11/24/2020   Sacroiliac joint pain 01/12/2020   Smoker 11/30/2015   Palpitations 04/27/2014   Vasovagal syncope 04/26/2014   Spondylosis 02/24/2014   Hip pain  12/12/2013   Fibromyalgia 02/10/2013   Somatization disorder 02/10/2013   Pseudoseizures 02/10/2013   DDD (degenerative disc disease), lumbosacral 02/10/2013   Spells 02/25/2012   History of idiopathic seizure 10/14/2011   SMOKER 05/20/2010   COPD (chronic obstructive pulmonary disease) ([place]) 05/20/2010   SHOULDER PAIN 10/27/2008   NECK PAIN 10/27/2008   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[place])    Myositis    Ocular migraine    Palpitations    yrs ago - only one episode r/t stress   PVC (premature ventricular contraction)    yrs ago - only one episode   Scoliosis    Seizures ([place])    unknown etiolgy; no meds, last seizure was in 2014   Smoker 11/30/2015   quit 2015   Spondylosis     Family History  Problem Relation Age of Onset   Multiple sclerosis Mother    Heart disease Father    Kidney disease Father    Alcohol abuse Brother    Mental retardation Brother    Schizophrenia Brother    Other Brother        back problems   ALS Brother    Colon cancer Maternal Grandmother    Healthy Son    Healthy Son     Past Surgical History:  Procedure Laterality Date   DILATION AND CURETTAGE OF UTERUS  11/18/1996   DILITATION & CURRETTAGE/HYSTROSCOPY WITH NOVASURE ABLATION N/A 01/16/2016   Procedure: HYSTEROSCOPY WITH NOVASURE ABLATION;  Surgeon: Jonnie Kind, MD;  Location: [facility];  Service: Gynecology;  Laterality: N/A;  Uterine Cavity Length 4.0 cm Uterine Cavity Width 3.3cm Power 73  Time 1 minute 17seconds   HIP SURGERY Left 2021   LAPAROSCOPIC BILATERAL SALPINGECTOMY Bilateral 01/16/2016   Procedure: LAPAROSCOPIC BILATERAL SALPINGECTOMY;  Surgeon: Jonnie Kind, MD;  Location: [facility];  Service:  Gynecology;  Laterality: Bilateral;  procedure 1   OOPHORECTOMY  11/18/2004   Left side   SHOULDER ARTHROSCOPY Left 06/04/2021   Left shoulder arthroscopy with biceps tenodesis mini open rotator cuff tear repair   SHOULDER ARTHROSCOPY Left 09/04/2021   Procedure: LEFT SHOULDER MANIPULATION UNDER ANESTHESIA, ARTHROSCOPY WITH ROTATOR INTERVAL RELEASE;  Surgeon: Meredith Pel, MD;  Location: [facility];  Service: Orthopedics;  Laterality: Left;   Social History   Occupational History   Not on file  Tobacco Use   Smoking status: Former    Packs/day: 1.00    Years: 20.00    Total pack years: 20.00    Types: Cigarettes    Quit date: 09/12/2016    Years since quitting: 5.8   Smokeless tobacco: Never  Vaping Use   Vaping Use: Every day  Substances: Nicotine, CBD, Flavoring   Devices: [employer]  Substance and Sexual Activity   Alcohol use: Yes    Comment: socially   Drug use: Never   Sexual activity: Yes    Birth control/protection: Surgical    Comment: tubal ligation and ablation

## 2022-07-10 ENCOUNTER — Other Ambulatory Visit: Payer: Self-pay | Admitting: Physician Assistant

## 2022-07-29 ENCOUNTER — Telehealth: Payer: Self-pay | Admitting: Internal Medicine

## 2022-07-29 NOTE — Telephone Encounter (Signed)
Patient called the office stating she needs to speak with Dr. Benjamine Mola. Patient declined to elaborate.

## 2022-07-31 NOTE — Telephone Encounter (Signed)
Symptoms with continued skin rashes increasing in extent over time so far. Topical steroids do improve but temporarily. She is having a lot of sinus congestion and throat pain with allergies in this season. I recommend alkaseltzer product is okay to combine with methotrexate with fairly low NSAID dose and she is not on a high dose of methotrexate. I also recommend addition of cetirizine 10 mg daily for allergic rhinitis symptoms also curious if this will impact skin rashes. She has scheduled dermatology appointment next month.

## 2022-08-12 ENCOUNTER — Ambulatory Visit (INDEPENDENT_AMBULATORY_CARE_PROVIDER_SITE_OTHER): Payer: Medicare Other | Admitting: Family Medicine

## 2022-08-12 VITALS — BP 114/70 | HR 77 | Temp 97.7°F | Ht 61.0 in | Wt 117.0 lb

## 2022-08-12 DIAGNOSIS — N10 Acute tubulo-interstitial nephritis: Secondary | ICD-10-CM | POA: Diagnosis not present

## 2022-08-12 DIAGNOSIS — Z87442 Personal history of urinary calculi: Secondary | ICD-10-CM

## 2022-08-12 DIAGNOSIS — R109 Unspecified abdominal pain: Secondary | ICD-10-CM

## 2022-08-12 LAB — URINALYSIS, ROUTINE W REFLEX MICROSCOPIC
Bilirubin Urine: NEGATIVE
Glucose, UA: NEGATIVE
Hyaline Cast: NONE SEEN /LPF
Ketones, ur: NEGATIVE
Nitrite: NEGATIVE
Specific Gravity, Urine: 1.02 (ref 1.001–1.035)
pH: 7 (ref 5.0–8.0)

## 2022-08-12 LAB — MICROSCOPIC MESSAGE

## 2022-08-12 MED ORDER — CIPROFLOXACIN HCL 500 MG PO TABS: 500.0000 mg | ORAL_TABLET | Freq: Two times a day (BID) | ORAL | 0 refills | Status: DC

## 2022-08-12 NOTE — Patient Instructions (Signed)
Pyelonephritis, Adult Pyelonephritis is an infection that occurs in the kidney. The kidneys are the organs that filter a person's blood and move waste out of the bloodstream and into the urine. Urine passes from the kidneys, through tubes called ureters, and into the bladder. There are two main types of pyelonephritis: Infections that come on quickly without any warning (acute pyelonephritis). Infections that last for a long period of time (chronic pyelonephritis). In most cases, the infection clears up with treatment and does not cause further problems. More severe infections or chronic infections can sometimes spread to the bloodstream or lead to other problems with the kidneys. What are the causes? This condition is usually caused by: Bacteria traveling from the bladder up to the kidney. This may occur after having a bladder infection (cystitis) or urinary tract infection (UTI). Bladder infections caused from bacteria traveling from the bloodstream to the kidney. What increases the risk? This condition is more likely to develop in: Pregnant women. Older people. People who have any of these conditions: Diabetes. Inflammation of the prostate gland (prostatitis), in males. Kidney stones or bladder stones. Other abnormalities of the kidney or ureter. Cancer. People who have a catheter placed in the bladder. People who are sexually active. Women who use spermicides. People who have had a prior UTI. What are the signs or symptoms? Symptoms of this condition include: Frequent urination. Strong or persistent urge to urinate. Burning or stinging when urinating. Abdominal pain. Back pain. Pain in the side or flank area. Fever or chills. Blood in the urine, or dark urine. Nausea or vomiting. How is this diagnosed? This condition may be diagnosed based on: Your medical history and a physical exam. Urine tests. Blood tests. You may also have imaging tests of the kidneys, such as an  ultrasound or CT scan. How is this treated? Treatment for this condition may depend on the severity of the infection. If the infection is mild and is found early, you may be treated with antibiotic medicines taken by mouth (orally). You will need to drink fluids to remain hydrated. If the infection is more severe, you may need to stay in the hospital and receive antibiotics given directly into a vein through an IV. You may also need to receive fluids through an IV if you are not able to remain hydrated. After your hospital stay, you may need to take oral antibiotics for a period of time. Other treatments may be required, depending on the cause of the infection. Follow these instructions at home: Medicines Take your antibiotic medicine as told by your health care provider. Do not stop taking the antibiotic even if you start to feel better. Take over-the-counter and prescription medicines only as told by your health care provider. General instructions  Drink enough fluid to keep your urine pale yellow. Avoid caffeine, tea, and carbonated beverages. They tend to irritate the bladder. Urinate often. Avoid holding in urine for long periods of time. Urinate before and after sex. After a bowel movement, women should cleanse from front to back. Use each tissue only once. Keep all follow-up visits as told by your health care provider. This is important. Contact a health care provider if: Your symptoms do not get better after 2 days of treatment. Your symptoms get worse. You have a fever. Get help right away if you: Are unable to take your antibiotics or fluids. Have shaking chills. Vomit. Have severe flank or back pain. Have extreme weakness or fainting. Summary Pyelonephritis is a urinary tract infection (  UTI) that occurs in the kidney. Treatment for this condition may depend on the severity of the infection. Take your antibiotic medicine as told by your health care provider. Do not stop  taking the antibiotic even if you start to feel better. Drink enough fluid to keep your urine pale yellow. Keep all follow-up visits as told by your health care provider. This is important. This information is not intended to replace advice given to you by your health care provider. Make sure you discuss any questions you have with your health care provider. Document Revised: 06/14/2021 Document Reviewed: 06/14/2021 Elsevier Patient Education  2023 Elsevier Inc.  

## 2022-08-12 NOTE — Progress Notes (Signed)
Acute Office Visit  Subjective:     Patient ID: Theresa Washington, female    DOB: 1981/01/19, 41 y.o.   MRN: [id number]  Chief Complaint  Patient presents with   Urinary Tract Infection    Per pt feels like she is passing kidney stones, c/o L-side pain also during urination. Pt has a history kidney stones in her early 20's. All this started last Weds.     Urinary Tract Infection  This is a new problem. The current episode started in the past 7 days. The problem occurs every urination. The problem has been gradually worsening. The quality of the pain is described as burning, aching and stabbing. The pain is at a severity of 9/10. There has been no fever. She is Sexually active. Associated symptoms include chills, flank pain, hesitancy and nausea. Pertinent negatives include no hematuria or vomiting. She has tried increased fluids for the symptoms. The treatment provided no relief. Her past medical history is significant for kidney stones and recurrent UTIs.    Review of Systems  Constitutional:  Positive for chills.  Gastrointestinal:  Positive for nausea. Negative for vomiting.  Genitourinary:  Positive for flank pain and hesitancy. Negative for hematuria.       Negative for vaginal burning, redness, swelling, or discharge        Objective:    BP 114/70   Pulse 77   Temp 97.7 F (36.5 C) (Oral)   Ht '5\' 1"'$  (1.549 m)   Wt 117 lb (53.1 kg)   SpO2 97%   BMI 22.11 kg/m    Physical Exam Vitals and nursing note reviewed.  Constitutional:      Appearance: Normal appearance. She is ill-appearing.  Abdominal:     General: There is no distension.     Tenderness: There is abdominal tenderness. There is left CVA tenderness and guarding.     Comments: Left lower quadrant tenderness to deep palpation  Neurological:     Mental Status: She is alert.   Urine dipstick shows positive for WBC's, positive for RBC's, positive for protein, and positive for leukocytes.  Micro exam: 10-20  WBC's per HPF, 3-10 RBC's per HPF, and few+ bacteria.   Results for orders placed or performed in visit on 08/12/22  Urinalysis, Routine w reflex microscopic  Result Value Ref Range   Color, Urine LIGHT YELLOW YELLOW   APPearance SLIGHTLY CLOUDY (A) CLEAR   Specific Gravity, Urine 1.020 1.001 - 1.035   pH 7.0 5.0 - 8.0   Glucose, UA NEGATIVE NEGATIVE   Bilirubin Urine NEGATIVE NEGATIVE   Ketones, ur NEGATIVE NEGATIVE   Hgb urine dipstick 1+ (A) NEGATIVE   Protein, ur 2+ (A) NEGATIVE   Nitrite NEGATIVE NEGATIVE   Leukocytes,Ua 2+ (A) NEGATIVE   WBC, UA 10-20 (A) 0 - 5 /HPF   RBC / HPF 3-10 (A) 0 - 2 /HPF   Squamous Epithelial / LPF 0-5 < OR = 5 /HPF   Bacteria, UA FEW (A) NONE SEEN /HPF   Hyaline Cast NONE SEEN NONE SEEN /LPF  Microscopic Message  Result Value Ref Range   Note          Assessment & Plan:   Problem List Items Addressed This Visit   None Visit Diagnoses     Acute pyelonephritis    -  Primary   Relevant Medications   ciprofloxacin (CIPRO) 500 MG tablet   Other Relevant Orders   Urinalysis, Routine w reflex microscopic (Completed)   Urine culture  Flank pain with history of urolithiasis       Relevant Orders   CBC with Differential/Platelet   COMPLETE METABOLIC PANEL WITH GFR   Ultrasound renal complete     Will treat for acute pyelonephritis with Cipro '500mg'$  BID for 7 days, will follow-up on culture. Discussed taking NSAIDs for pain and may use OTC AZO for dysuria. She would like to be evaluated for kidney stones based on her symptoms and previous history of stones, will order renal US.   Meds ordered this encounter  Medications   ciprofloxacin (CIPRO) 500 MG tablet    Sig: Take 1 tablet (500 mg total) by mouth 2 (two) times daily.    Dispense:  14 tablet    Refill:  0    Order Specific Question:   Supervising Provider    Answer:   Jenna Luo T [[id number]]    Return if symptoms worsen or fail to improve.  Rubie Maid, FNP

## 2022-08-13 ENCOUNTER — Ambulatory Visit
Admission: RE | Admit: 2022-08-13 | Discharge: 2022-08-13 | Disposition: A | Discharge status: Home / Self Care | Payer: Medicare Other | Source: Ambulatory Visit | Attending: Family Medicine | Admitting: Family Medicine

## 2022-08-13 DIAGNOSIS — R1032 Left lower quadrant pain: Secondary | ICD-10-CM | POA: Diagnosis not present

## 2022-08-13 DIAGNOSIS — R109 Unspecified abdominal pain: Secondary | ICD-10-CM

## 2022-08-13 LAB — CBC WITH DIFFERENTIAL/PLATELET
Absolute Monocytes: 684 cells/uL (ref 200–950)
Basophils Absolute: 27 cells/uL (ref 0–200)
Basophils Relative: 0.3 %
Eosinophils Absolute: 54 cells/uL (ref 15–500)
Eosinophils Relative: 0.6 %
HCT: 35.1 % (ref 35.0–45.0)
Hemoglobin: 11.9 g/dL (ref 11.7–15.5)
Lymphs Abs: 2124 cells/uL (ref 850–3900)
MCH: 32.2 pg (ref 27.0–33.0)
MCHC: 33.9 g/dL (ref 32.0–36.0)
MCV: 95.1 fL (ref 80.0–100.0)
MPV: 10.1 fL (ref 7.5–12.5)
Monocytes Relative: 7.6 %
Neutro Abs: 6111 cells/uL (ref 1500–7800)
Neutrophils Relative %: 67.9 %
Platelets: 323 10*3/uL (ref 140–400)
RBC: 3.69 10*6/uL — ABNORMAL LOW (ref 3.80–5.10)
RDW: 12.7 % (ref 11.0–15.0)
Total Lymphocyte: 23.6 %
WBC: 9 10*3/uL (ref 3.8–10.8)

## 2022-08-13 LAB — COMPLETE METABOLIC PANEL WITH GFR
AG Ratio: 1.9 (calc) (ref 1.0–2.5)
ALT: 18 U/L (ref 6–29)
AST: 12 U/L (ref 10–30)
Albumin: 4.2 g/dL (ref 3.6–5.1)
Alkaline phosphatase (APISO): 50 U/L (ref 31–125)
BUN/Creatinine Ratio: 7 (calc) (ref 6–22)
BUN: 7 mg/dL (ref 7–25)
CO2: 24 mmol/L (ref 20–32)
Calcium: 8.9 mg/dL (ref 8.6–10.2)
Chloride: 108 mmol/L (ref 98–110)
Creat: 1.05 mg/dL — ABNORMAL HIGH (ref 0.50–0.99)
Globulin: 2.2 g/dL (calc) (ref 1.9–3.7)
Glucose, Bld: 74 mg/dL (ref 65–99)
Potassium: 4.2 mmol/L (ref 3.5–5.3)
Sodium: 140 mmol/L (ref 135–146)
Total Bilirubin: 0.2 mg/dL (ref 0.2–1.2)
Total Protein: 6.4 g/dL (ref 6.1–8.1)
eGFR: 68 mL/min/{1.73_m2} (ref >=60)

## 2022-08-14 ENCOUNTER — Telehealth: Payer: Self-pay

## 2022-08-14 LAB — URINE CULTURE
MICRO NUMBER:: 13962844
Result:: NO GROWTH
SPECIMEN QUALITY:: ADEQUATE

## 2022-08-14 NOTE — Telephone Encounter (Signed)
Patient called in stating that her PCP prescribed a 7 day course of cipro. Patient is currently taking methotrexate and took her last dose on Monday 08/12/2022. Patient had questions in regards to taking the antibiotic while on methotrexate. I advised patient to hold methotrexate until she completes the course of antibiotics and symptoms have resolved. Patient verbalized understanding.

## 2022-08-22 ENCOUNTER — Telehealth: Payer: Self-pay | Admitting: Internal Medicine

## 2022-08-22 NOTE — Telephone Encounter (Signed)
I spoke with Ms. Bonsall she had recent abdominal and flank pain concerning for UTI or kidney infection had labs and urine collected on September 25 urinalysis was suspicious although urine culture data was no growth after 10 days.  Blood count and metabolic panel were normal.  She was prescribed a course of ciprofloxacin and is completing this tomorrow.  She skipped her weekly methotrexate while taking antibiotics as directed.  Since 3 days ago she is experiencing pain at the right hip and a sensation of popping at the hip while walking but does not feel anything moving out of place.  She does report having previous similar symptoms in the past.  This sounds likely for iliopsoas tendinitis. Can take up to a few weeks to fully resolve but anticipate she will start feeling better after a couple more days.  She tries to avoid taking NSAID medications had partial relief with an Epsom salt bath. recommend conservative treatment initially with stretching or can use warmth or heat and resting this. I recommend conservative treatment initially with stretching or can use warmth or heat and resting this.

## 2022-08-22 NOTE — Telephone Encounter (Signed)
Patient left a voicemail stating she is experiencing swelling in her right hip and requested a return call.

## 2022-09-06 DIAGNOSIS — D485 Neoplasm of uncertain behavior of skin: Secondary | ICD-10-CM | POA: Diagnosis not present

## 2022-09-06 DIAGNOSIS — D2261 Melanocytic nevi of right upper limb, including shoulder: Secondary | ICD-10-CM | POA: Diagnosis not present

## 2022-09-06 DIAGNOSIS — L905 Scar conditions and fibrosis of skin: Secondary | ICD-10-CM | POA: Diagnosis not present

## 2022-09-06 DIAGNOSIS — L821 Other seborrheic keratosis: Secondary | ICD-10-CM | POA: Diagnosis not present

## 2022-09-06 DIAGNOSIS — D2271 Melanocytic nevi of right lower limb, including hip: Secondary | ICD-10-CM | POA: Diagnosis not present

## 2022-09-06 DIAGNOSIS — L309 Dermatitis, unspecified: Secondary | ICD-10-CM | POA: Diagnosis not present

## 2022-09-18 ENCOUNTER — Other Ambulatory Visit: Payer: Self-pay | Admitting: Internal Medicine

## 2022-09-18 DIAGNOSIS — M0579 Rheumatoid arthritis with rheumatoid factor of multiple sites without organ or systems involvement: Secondary | ICD-10-CM

## 2022-09-18 NOTE — Telephone Encounter (Signed)
Next Visit: Due around 10/03/2022. Message sent to the front to schedule.  Last Visit: 07/03/2022  Last Fill: 05/15/2022  DX:  Rheumatoid arthritis with rheumatoid factor of multiple sites without organ or systems involvement  Current Dose per office note 07/03/2022: methotrexate 15 mg subcu weekly  Labs: 07/04/2022 Her labs look fine for continuing the methotrexate at this time. Her sedimentation rate remains normal.  Okay to refill methotrexate?

## 2022-09-23 ENCOUNTER — Telehealth: Payer: Self-pay | Admitting: Internal Medicine

## 2022-09-23 NOTE — Telephone Encounter (Signed)
Patient called stating she is experiencing pain on the left side of her neck as well as sore throat.  Patient states she plans to call her PCP to rule out strept throat.  Patient is not sure if she should hold her Methotrexate which she is due to take today, 09/23/22.

## 2022-09-24 ENCOUNTER — Encounter: Payer: Self-pay | Admitting: Family Medicine

## 2022-09-24 ENCOUNTER — Ambulatory Visit (INDEPENDENT_AMBULATORY_CARE_PROVIDER_SITE_OTHER): Payer: Medicare Other | Admitting: Family Medicine

## 2022-09-24 VITALS — BP 128/72 | HR 79 | Temp 98.0°F | Resp 12 | Ht 61.0 in

## 2022-09-24 DIAGNOSIS — J029 Acute pharyngitis, unspecified: Secondary | ICD-10-CM | POA: Diagnosis not present

## 2022-09-24 LAB — INFLUENZA A AND B AG, IMMUNOASSAY
INFLUENZA A ANTIGEN: NOT DETECTED
INFLUENZA B ANTIGEN: NOT DETECTED

## 2022-09-24 NOTE — Progress Notes (Signed)
Acute Office Visit  Subjective:     Patient ID: Theresa Washington, female    DOB: 1981/05/21, 41 y.o.   MRN: [id number]  Chief Complaint  Patient presents with   Follow-up    fever since yesterday, intense pain on L side of neck (tender to touch), very sore throat (parking lot visit)    HPI Patient is in today for sore throat, left neck swelling and tenderness, chills, fatigue and headache for 2 days. She reports always having sinus drainage. She denies cough, rhinorrhea, sinus pressure, shortness of breath, wheezing, chest pain, abdominal pain, nausea, vomiting, or diarrhea. No sick exposures. She has not tried any medications and has not taken a COVID test.    Review of Systems  Constitutional:  Positive for chills, fever and malaise/fatigue.  HENT:  Positive for sore throat.   Respiratory: Negative.    Cardiovascular: Negative.   Gastrointestinal: Negative.   Skin: Negative.   Neurological:  Positive for weakness.        Objective:    BP 128/72 (BP Location: Left Arm, Patient Position: Sitting)   Pulse 79   Temp 98 F (36.7 C) (Oral)   Resp 12   Ht '5\' 1"'$  (1.549 m)   SpO2 99%   BMI 22.11 kg/m    Physical Exam Vitals and nursing note reviewed.  Constitutional:      Appearance: Normal appearance. She is normal weight.  HENT:     Head: Normocephalic and atraumatic.     Mouth/Throat:     Mouth: Mucous membranes are moist.     Pharynx: Posterior oropharyngeal erythema present.  Eyes:     Extraocular Movements: Extraocular movements intact.     Conjunctiva/sclera: Conjunctivae normal.     Pupils: Pupils are equal, round, and reactive to light.  Cardiovascular:     Rate and Rhythm: Normal rate and regular rhythm.     Pulses: Normal pulses.     Heart sounds: Normal heart sounds.  Pulmonary:     Effort: Pulmonary effort is normal.     Breath sounds: Examination of the right-lower field reveals decreased breath sounds. Decreased breath sounds present.   Musculoskeletal:     Cervical back: Tenderness present.  Lymphadenopathy:     Cervical: Cervical adenopathy (anterior) present.  Skin:    General: Skin is warm and dry.  Neurological:     General: No focal deficit present.     Mental Status: She is alert and oriented to person, place, and time. Mental status is at baseline.  Psychiatric:        Mood and Affect: Mood normal.        Behavior: Behavior normal.        Thought Content: Thought content normal.        Judgment: Judgment normal.     Results for orders placed or performed in visit on 09/24/22  STREP GROUP A AG, W/REFLEX TO CULT   Specimen: Throat  Result Value Ref Range   Streptococcus Group A AG NOT DETECTED NOT DETECTED  Influenza A and B Ag, Immunoassay  Result Value Ref Range   Source: NASOPHARYNX    INFLUENZA A ANTIGEN NOT DETECTED NOT DETECTED   INFLUENZA B ANTIGEN NOT DETECTED NOT DETECTED        Assessment & Plan:   1. Sore throat - STREP GROUP A AG, W/REFLEX TO CULT - Influenza A and B Ag, Immunoassay  2. Viral pharyngitis In house strep and flu tests were negative. She declines testing  for Mononucleosis or Covid at this time. I suspect this is a viral pharyngitis. We discussed red flag symptoms such as voice changes, swelling throat or difficulty breathing, worsening fever or chills, and worsening overall symptoms and when to seek medical care. Provided reassurance this is likely viral and will resolve with time. Instructed to push fluids, rest, and use Tylenol for symptomatic management.    Return if symptoms worsen or fail to improve.  Rubie Maid, FNP

## 2022-09-26 DIAGNOSIS — D485 Neoplasm of uncertain behavior of skin: Secondary | ICD-10-CM | POA: Diagnosis not present

## 2022-09-26 DIAGNOSIS — L905 Scar conditions and fibrosis of skin: Secondary | ICD-10-CM | POA: Diagnosis not present

## 2022-09-26 LAB — CULTURE, GROUP A STREP
MICRO NUMBER:: 14154510
SPECIMEN QUALITY:: ADEQUATE

## 2022-09-26 LAB — STREP GROUP A AG, W/REFLEX TO CULT: Streptococcus Group A AG: NOT DETECTED

## 2022-09-30 NOTE — Telephone Encounter (Signed)
Attempted to contact the patient and left message for patient to call the office.  

## 2022-09-30 NOTE — Telephone Encounter (Signed)
I agree with discontinuing the medication. I would be interested to see her dermatologist's office note--if she can bring this with her next month that is fine, or else can we request a copy sent before our follow up visit?

## 2022-09-30 NOTE — Telephone Encounter (Signed)
Patient contacted the office about her MTX. Patient states she had a rash in October. Patient states it was on her forehead and the back of her neck. Patient states it is now on her wrist. Patient states she was referred to dermatology Dr. Rollene Rotunda at [facility]. 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 Patient states Dr. Particia Nearing believes the rash she is having is coming from the MTX. Patient would like to discuss other treatment options at her follow up visit on 10/22/2022. Noted on her appointment note. Patient was off her MTX last week due to being on antibiotics. Patient states she is not going to restart the MTX and will discuss treatment options.

## 2022-09-30 NOTE — Telephone Encounter (Signed)
Patient advised Dr. Benjamine Mola agrees with discontinuing the medication. Dr. Benjamine Mola would be interested to see her dermatologist's office note--if she can bring this with her next month. Patient states she will obtain a copy and bring it with her to her visit.

## 2022-10-21 ENCOUNTER — Telehealth: Payer: Self-pay | Admitting: Internal Medicine

## 2022-10-21 NOTE — Telephone Encounter (Signed)
Patient called the office stating she is having a flare. Patient states she is off of MTX and was supposed to come in tomorrow to talk about treatment options. Patients appointment was rescheduled due to Dr. Benjamine Mola being out of office. Patient requests a call back ASAP. Patient states she has been flaring for 2 weeks all over her body. Patient would like to talk about what she can do for the flare until her appointment. Patient was placed on cancellation list.

## 2022-10-22 ENCOUNTER — Ambulatory Visit: Payer: Medicare Other | Admitting: Internal Medicine

## 2022-10-28 ENCOUNTER — Ambulatory Visit: Payer: Medicare Other | Admitting: Family Medicine

## 2022-10-28 NOTE — Progress Notes (Unsigned)
Office Visit Note  [name]             Date of Birth: [birth year]           MRN: [id number]             PCP: Susy Frizzle, MD Referring: Susy Frizzle, MD Visit Date: 10/29/2022   Subjective:  No chief complaint on file.   History of Present Illness: Theresa Washington is a 41 y.o. female here for follow up for seropositive RA she stopped methotrexate since last visit due to skin rashes that appeared to be drug induced based on dermatology assessment.***   Previous HPI 07/03/22 Theresa Washington is a 41 y.o. female here for follow up for follow up for seropositive RA on methotrexate 15 mg subcu weekly.  Joint symptoms have been doing fairly well still having some periodic worsening with hip pains but no severe flareup in several weeks.  She is noticing a bit more of hand stiffness but not limiting her activity and no visible swelling.  She has been noticing ongoing rash with small raised red bumps.  These improved when treated with a topical steroid but keep coming back.  Most recently seeing these on both arms and legs seem to be popping up in a linear pattern every few inches up the entire extremity.    Previous HPI 07/10/21 Theresa Washington is a 41 y.o. female here for chronic joint of multiple sites with positive rheumatoid factor. She has history of left shoulder arthroscopy for rotator cuff tear and let hip arthroscopy for labral tear. Joint pains are mostly chronic worst in the shoulders, elbows, hips, and knees bilaterally. These were evaluated years ago with negative serology and attributed to her injury changes and fibromyalgia syndrome. She previously developed high increases in inflammatory markers despite negative antibody tests. Pain has gotten worse some in the shoulders and hips treated with orthopedics but now also more pain and swelling in elbows. She is stiff for more than an hour in mornings but also has worsening pain with heavier use. She  gets the most relief from prednisone and has taken intermittent short tapering doses with good relief but when recurrence afterwards. Individuals flares or episodes lasting about 7-10 days typically. Most recent treatment finished 2 days ago.   No Rheumatology ROS completed.   PMFS History:  Patient Active Problem List   Diagnosis Date Noted   Livedo reticularis 01/08/2022   Trochanteric bursitis, left hip 08/31/2021   Bilateral knee pain 08/01/2021   Rheumatoid arthritis with rheumatoid factor of multiple sites without organ or systems involvement ([place]) 07/10/2021   High risk medication use 07/10/2021   Essential hypertension 11/24/2020   Nonrheumatic mitral valve regurgitation 11/24/2020   Smoker 11/30/2015   Palpitations 04/27/2014   Spondylosis 02/24/2014   Fibromyalgia 02/10/2013   Somatization disorder 02/10/2013   Pseudoseizures 02/10/2013   DDD (degenerative disc disease), lumbosacral 02/10/2013   History of idiopathic seizure 10/14/2011   COPD (chronic obstructive pulmonary disease) ([place]) 05/20/2010    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[place])  Myositis    Ocular migraine    Palpitations    yrs ago - only one episode r/t stress   PVC (premature ventricular contraction)    yrs ago - only one episode   Scoliosis    Seizures ([place])    unknown etiolgy; no meds, last seizure was in 2014   Smoker 11/30/2015   quit 2015   Spondylosis     Family History  Problem Relation Age of Onset   Multiple sclerosis Mother    Heart disease Father    Kidney disease Father    Alcohol abuse Brother    Mental retardation Brother    Schizophrenia Brother    Other Brother        back problems   ALS Brother     Colon cancer Maternal Grandmother    Healthy Son    Healthy Son    Past Surgical History:  Procedure Laterality Date   DILATION AND CURETTAGE OF UTERUS  11/18/1996   DILITATION & CURRETTAGE/HYSTROSCOPY WITH NOVASURE ABLATION N/A 01/16/2016   Procedure: HYSTEROSCOPY WITH NOVASURE ABLATION;  Surgeon: Jonnie Kind, MD;  Location: [facility];  Service: Gynecology;  Laterality: N/A;  Uterine Cavity Length 4.0 cm Uterine Cavity Width 3.3cm Power 73  Time 1 minute 17seconds   HIP SURGERY Left 2021   LAPAROSCOPIC BILATERAL SALPINGECTOMY Bilateral 01/16/2016   Procedure: LAPAROSCOPIC BILATERAL SALPINGECTOMY;  Surgeon: Jonnie Kind, MD;  Location: [facility];  Service: Gynecology;  Laterality: Bilateral;  procedure 1   OOPHORECTOMY  11/18/2004   Left side   SHOULDER ARTHROSCOPY Left 06/04/2021   Left shoulder arthroscopy with biceps tenodesis mini open rotator cuff tear repair   SHOULDER ARTHROSCOPY Left 09/04/2021   Procedure: LEFT SHOULDER MANIPULATION UNDER ANESTHESIA, ARTHROSCOPY WITH ROTATOR INTERVAL RELEASE;  Surgeon: Meredith Pel, MD;  Location: [facility];  Service: Orthopedics;  Laterality: Left;   Social History   Social History Narrative   2 sons, Truddie Crumble and [place].   Immunization History  Administered Date(s) Administered   Influenza Whole 07/19/2012   Influenza,inj,Quad PF,6+ Mos 09/15/2015, 10/17/2017   Influenza-Unspecified 08/30/2011   Moderna Sars-Covid-2 Vaccination 08/26/2020, 09/23/2020   Tdap 03/01/2011     Objective: Vital Signs: There were no vitals taken for this visit.   Physical Exam   Musculoskeletal Exam: ***  CDAI Exam: CDAI Score: -- Patient Global: --; Provider Global: -- Swollen: --; Tender: -- Joint Exam 10/29/2022   No joint exam has been documented for this visit   There is currently no information documented on the homunculus. Go to the Rheumatology activity and complete the homunculus joint exam.  Investigation: No additional  findings.  Imaging: No results found.  Recent Labs: Lab Results  Component Value Date   WBC 9.0 08/12/2022   HGB 11.9 08/12/2022   PLT 323 08/12/2022   NA 140 08/12/2022   K 4.2 08/12/2022   CL 108 08/12/2022   CO2 24 08/12/2022   GLUCOSE 74 08/12/2022   BUN 7 08/12/2022   CREATININE 1.05 (H) 08/12/2022   BILITOT 0.2 08/12/2022   ALKPHOS 80 09/01/2015   AST 12 08/12/2022   ALT 18 08/12/2022   PROT 6.4 08/12/2022   ALBUMIN 4.0 09/01/2015   CALCIUM 8.9 08/12/2022   GFRAA 106 10/24/2020   QFTBGOLDPLUS NEGATIVE 07/10/2021    Speciality Comments: No specialty comments available.  Procedures:  No procedures performed Allergies: Shrimp [shellfish allergy] and Other   Assessment / Plan:     Visit Diagnoses: No diagnosis found.  ***  Orders: No orders of the defined types were placed in this encounter.  No orders of the defined types were placed in this encounter.    Follow-Up Instructions: No follow-ups on file.   Collier Salina, MD  Note - This record has been created using [employer].  Chart creation errors have been sought, but may not always  have been located. Such creation errors do not reflect on  the standard of medical care.

## 2022-10-29 ENCOUNTER — Encounter: Payer: Self-pay | Admitting: Internal Medicine

## 2022-10-29 ENCOUNTER — Ambulatory Visit: Payer: Medicare Other | Attending: Internal Medicine | Admitting: Internal Medicine

## 2022-10-29 ENCOUNTER — Other Ambulatory Visit: Payer: Self-pay | Admitting: Internal Medicine

## 2022-10-29 VITALS — BP 120/84 | HR 76 | Resp 14 | Ht 60.0 in | Wt 120.0 lb

## 2022-10-29 DIAGNOSIS — M0579 Rheumatoid arthritis with rheumatoid factor of multiple sites without organ or systems involvement: Secondary | ICD-10-CM | POA: Diagnosis not present

## 2022-10-29 DIAGNOSIS — Z79899 Other long term (current) drug therapy: Secondary | ICD-10-CM | POA: Diagnosis not present

## 2022-10-29 DIAGNOSIS — M7062 Trochanteric bursitis, left hip: Secondary | ICD-10-CM

## 2022-10-29 MED ORDER — LEFLUNOMIDE 10 MG PO TABS: 10.0000 mg | ORAL_TABLET | Freq: Every day | ORAL | 1 refills | Status: DC

## 2022-10-29 NOTE — Patient Instructions (Signed)
Leflunomide Tablets What is this medication? LEFLUNOMIDE (le FLOO na mide) treats the symptoms of rheumatoid arthritis. It works by slowing down an overactive immune system. This decreases inflammation. It belongs to a group of medications called DMARDs. This medicine may be used for other purposes; ask your health care provider or pharmacist if you have questions. COMMON BRAND NAME(S): Arava What should I tell my care team before I take this medication? They need to know if you have any of these conditions: Cancer Diabetes High blood pressure Immune system problems Infection Kidney disease Liver disease Low blood cell levels (white cells, red cells, and platelets) Lung or breathing disease, such as asthma or COPD Recent or upcoming vaccine Skin conditions Tingling of the fingers or toes, or other nerve disorder An unusual or allergic reaction to leflunomide, other medications, food, dyes, or preservatives Pregnant or trying to get pregnant Breastfeeding How should I use this medication? Take this medication by mouth with a full glass of water. Take it as directed on the prescription label at the same time every day. Keep taking it unless your care team tells you to stop. Talk to your care team about the use of this medication in children. Special care may be needed. Overdosage: If you think you have taken too much of this medicine contact a poison control center or emergency room at once. NOTE: This medicine is only for you. Do not share this medicine with others. What if I miss a dose? If you miss a dose, take it as soon as you can. If it is almost time for your next dose, take only that dose. Do not take double or extra doses. What may interact with this medication? Do not take this medication with any of the following: Teriflunomide This medication may also interact with the following: Alosetron Caffeine Cefaclor Certain medications for diabetes, such as nateglinide,  repaglinide, rosiglitazone, pioglitazone Certain medications for high cholesterol, such as atorvastatin, pravastatin, rosuvastatin, simvastatin Charcoal Cholestyramine Ciprofloxacin Duloxetine Estrogen and progestin hormones Furosemide Ketoprofen Live virus vaccines Medications that increase your risk for infection Methotrexate Mitoxantrone Paclitaxel Penicillin Theophylline Tizanidine Warfarin This list may not describe all possible interactions. Give your health care provider a list of all the medicines, herbs, non-prescription drugs, or dietary supplements you use. Also tell them if you smoke, drink alcohol, or use illegal drugs. Some items may interact with your medicine. What should I watch for while using this medication? Visit your care team for regular checks on your progress. Tell your care team if your symptoms do not start to get better or if they get worse. You may need blood work done while you are taking this medication. This medication may cause serious skin reactions. They can happen weeks to months after starting the medication. Contact your care team right away if you notice fevers or flu-like symptoms with a rash. The rash may be red or purple and then turn into blisters or peeling of the skin. You may also notice a red rash with swelling of the face, lips, or lymph nodes in your neck or under your arms. You should not receive certain vaccines during your treatment and for a certain time after your treatment with this medication ends. Talk to your care team for more information. This medication may stay in your body for up to 2 years after your last dose. Tell your care team about any unusual side effects or symptoms. A medication can be given to help lower your blood levels of this  medication more quickly. Talk to your care team if you may be pregnant. This medication can cause serious birth defects if taken during pregnancy and for a while after the last dose. You will  need a negative pregnancy test before starting this medication. Contraception is recommended while taking this medication and for a while after the last dose. Your care team can help you find the option that works for you. Do not breastfeed while taking this medication. What side effects may I notice from receiving this medication? Side effects that you should report to your care team as soon as possible: Allergic reactions--skin rash, itching, hives, swelling of the face, lips, tongue, or throat Dry cough, shortness of breath or trouble breathing Increase in blood pressure Infection--fever, chills, cough, sore throat, wounds that don't heal, pain or trouble when passing urine, general feeling of discomfort or being unwell Redness, blistering, peeling, or loosening of the skin, including inside the mouth Liver injury--right upper belly pain, loss of appetite, nausea, light-colored stool, dark yellow or brown urine, yellowing skin or eyes, unusual weakness or fatigue Pain, tingling, or numbness in the hands or feet Unusual bruising or bleeding Side effects that usually do not require medical attention (report to your care team if they continue or are bothersome): Back pain Diarrhea Hair loss Headache Nausea This list may not describe all possible side effects. Call your doctor for medical advice about side effects. You may report side effects to FDA at 1-800-FDA-1088. Where should I keep my medication? Keep out of the reach of children and pets. Store at room temperature between 20 and 25 degrees C (68 and 77 degrees F). Protect from moisture and light. Keep the container tightly closed. Get rid of any unused medication after the expiration date. To get rid of medications that are no longer needed or have expired: Take the medication to a medication take-back program. Check with your pharmacy or law enforcement to find a location. If you cannot return the medication, ask your pharmacist or  care team how to get rid of this medication safely. NOTE: This sheet is a summary. It may not cover all possible information. If you have questions about this medicine, talk to your doctor, pharmacist, or health care provider.  2023 Elsevier/Gold Standard (2007-12-26 00:00:00)

## 2022-10-30 LAB — CBC WITH DIFFERENTIAL/PLATELET
Absolute Monocytes: 589 cells/uL (ref 200–950)
Basophils Absolute: 51 cells/uL (ref 0–200)
Basophils Relative: 0.8 %
Eosinophils Absolute: 70 cells/uL (ref 15–500)
Eosinophils Relative: 1.1 %
HCT: 39.5 % (ref 35.0–45.0)
Hemoglobin: 13.3 g/dL (ref 11.7–15.5)
Lymphs Abs: 1677 cells/uL (ref 850–3900)
MCH: 31.4 pg (ref 27.0–33.0)
MCHC: 33.7 g/dL (ref 32.0–36.0)
MCV: 93.2 fL (ref 80.0–100.0)
MPV: 10.2 fL (ref 7.5–12.5)
Monocytes Relative: 9.2 %
Neutro Abs: 4013 cells/uL (ref 1500–7800)
Neutrophils Relative %: 62.7 %
Platelets: 347 10*3/uL (ref 140–400)
RBC: 4.24 10*6/uL (ref 3.80–5.10)
RDW: 11.7 % (ref 11.0–15.0)
Total Lymphocyte: 26.2 %
WBC: 6.4 10*3/uL (ref 3.8–10.8)

## 2022-10-30 LAB — COMPLETE METABOLIC PANEL WITH GFR
AG Ratio: 1.8 (calc) (ref 1.0–2.5)
ALT: 24 U/L (ref 6–29)
AST: 15 U/L (ref 10–30)
Albumin: 4.5 g/dL (ref 3.6–5.1)
Alkaline phosphatase (APISO): 44 U/L (ref 31–125)
BUN: 19 mg/dL (ref 7–25)
CO2: 23 mmol/L (ref 20–32)
Calcium: 9.5 mg/dL (ref 8.6–10.2)
Chloride: 105 mmol/L (ref 98–110)
Creat: 0.86 mg/dL (ref 0.50–0.99)
Globulin: 2.5 g/dL (calc) (ref 1.9–3.7)
Glucose, Bld: 77 mg/dL (ref 65–99)
Potassium: 4.4 mmol/L (ref 3.5–5.3)
Sodium: 137 mmol/L (ref 135–146)
Total Bilirubin: 0.2 mg/dL (ref 0.2–1.2)
Total Protein: 7 g/dL (ref 6.1–8.1)
eGFR: 87 mL/min/{1.73_m2} (ref >=60)

## 2022-10-30 LAB — SEDIMENTATION RATE: Sed Rate: 11 mm/h (ref 0–20)

## 2022-11-03 ENCOUNTER — Other Ambulatory Visit: Payer: Self-pay | Admitting: Family Medicine

## 2022-11-04 NOTE — Progress Notes (Signed)
Lab results were all fine so should not be a problem for trying the leflunomide as planned.

## 2022-11-19 ENCOUNTER — Ambulatory Visit: Payer: Medicare Other | Admitting: Internal Medicine

## 2022-12-11 ENCOUNTER — Ambulatory Visit: Payer: Medicare Other | Admitting: Internal Medicine

## 2023-02-03 NOTE — Progress Notes (Deleted)
Office Visit Note  [name]             Date of Birth: [birth year]           MRN: [id number]             PCP: Susy Frizzle, MD Referring: Susy Frizzle, MD Visit Date: 02/04/2023   Subjective:  No chief complaint on file.   History of Present Illness: Theresa Washington is a 42 y.o. female here for follow up ***   Previous HPI 10/29/22 Theresa Washington is a 42 y.o. female here for follow up for seropositive RA she stopped methotrexate since last visit due to skin rashes that appeared to be drug induced based on dermatology assessment.  Since stopping the medication her skin rashes did completely resolved.  However since about a week ago she experienced a significant flareup with pain involving bilateral hips left knee and the left shoulder.  She saw Dr. Marlou Sa for evaluation of the shoulder with concern due to previous adhesive capsulitis that has been managing this conservatively.  Overall symptoms have partially improved again over the course of the past week still has some pain and tenderness on both hips.  Still has problems with increased pain on the lateral hip at night as she is a side sleeper.  She felt some heat and swelling at the knee while he was active and states it feels like it is unstable or loose towards the lateral side.   Previous HPI 07/03/22 Theresa Washington is a 42 y.o. female here for follow up for follow up for seropositive RA on methotrexate 15 mg subcu weekly.  Joint symptoms have been doing fairly well still having some periodic worsening with hip pains but no severe flareup in several weeks.  She is noticing a bit more of hand stiffness but not limiting her activity and no visible swelling.  She has been noticing ongoing rash with small raised red bumps.  These improved when treated with a topical steroid but keep coming back.  Most recently seeing these on both arms and legs seem to be popping up in a linear pattern every few inches  up the entire extremity.    Previous HPI 07/10/21 Theresa Washington is a 42 y.o. female here for chronic joint of multiple sites with positive rheumatoid factor. She has history of left shoulder arthroscopy for rotator cuff tear and let hip arthroscopy for labral tear. Joint pains are mostly chronic worst in the shoulders, elbows, hips, and knees bilaterally. These were evaluated years ago with negative serology and attributed to her injury changes and fibromyalgia syndrome. She previously developed high increases in inflammatory markers despite negative antibody tests. Pain has gotten worse some in the shoulders and hips treated with orthopedics but now also more pain and swelling in elbows. She is stiff for more than an hour in mornings but also has worsening pain with heavier use. She gets the most relief from prednisone and has taken intermittent short tapering doses with good relief but when recurrence afterwards. Individuals flares or episodes lasting about 7-10 days typically. Most recent treatment finished 2 days ago.   No Rheumatology ROS completed.   PMFS History:  Patient Active Problem List   Diagnosis Date Noted   Livedo reticularis 01/08/2022   Trochanteric bursitis, left hip 08/31/2021   Bilateral knee pain 08/01/2021   Rheumatoid arthritis with rheumatoid factor of multiple sites without organ or systems involvement ([place]) 07/10/2021  High risk medication use 07/10/2021   Essential hypertension 11/24/2020   Nonrheumatic mitral valve regurgitation 11/24/2020   Smoker 11/30/2015   Palpitations 04/27/2014   Spondylosis 02/24/2014   Fibromyalgia 02/10/2013   Somatization disorder 02/10/2013   Pseudoseizures 02/10/2013   DDD (degenerative disc disease), lumbosacral 02/10/2013   History of idiopathic seizure 10/14/2011   COPD (chronic obstructive pulmonary disease) ([place]) 05/20/2010    Past Medical History:  Diagnosis Date   Anemia    Anxiety    Back pain    Bell's palsy     no residual   Contraceptive management 11/30/2015   DDD (degenerative disc disease)    C3-C5, T12-L, L3, L4, L5, S1, S2   Fibromyalgia    History of kidney stones    passed stones   Hypertension    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[facility])    Myositis    Ocular migraine    Palpitations    yrs ago - only one episode r/t stress   Palpitations 04/27/2014   PVC (premature ventricular contraction)    yrs ago - only one episode   Scoliosis    Seizures ([place])    unknown etiolgy; no meds, last seizure was in 2014   Smoker 11/30/2015   quit 2015   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[place])    Spondylosis     Family History  Problem Relation Age of Onset   Multiple sclerosis Mother    Heart disease Father    Kidney disease Father    Alcohol abuse Brother    Mental retardation Brother    Schizophrenia Brother    Other Brother        back problems   ALS Brother    Colon cancer Maternal Grandmother    Healthy Son    Healthy Son    Past Surgical History:  Procedure Laterality Date   DILATION AND CURETTAGE OF UTERUS  11/18/1996   DILITATION & CURRETTAGE/HYSTROSCOPY WITH NOVASURE ABLATION N/A 01/16/2016   Procedure: HYSTEROSCOPY WITH NOVASURE ABLATION;  Surgeon: Jonnie Kind, MD;  Location: [facility];  Service: Gynecology;  Laterality: N/A;  Uterine Cavity Length 4.0 cm Uterine Cavity Width 3.3cm Power 73  Time 1 minute 17seconds   HIP SURGERY Left 2021   LAPAROSCOPIC BILATERAL SALPINGECTOMY Bilateral 01/16/2016   Procedure: LAPAROSCOPIC BILATERAL SALPINGECTOMY;  Surgeon: Jonnie Kind, MD;  Location: [facility];  Service: Gynecology;  Laterality: Bilateral;  procedure 1   OOPHORECTOMY  11/18/2004   Left side   SHOULDER ARTHROSCOPY Left 06/04/2021   Left shoulder arthroscopy with biceps tenodesis mini open rotator cuff tear repair   SHOULDER ARTHROSCOPY Left 09/04/2021   Procedure: LEFT SHOULDER MANIPULATION UNDER ANESTHESIA, ARTHROSCOPY WITH ROTATOR INTERVAL RELEASE;  Surgeon: Meredith Pel, MD;   Location: [facility];  Service: Orthopedics;  Laterality: Left;   Social History   Social History Narrative   2 sons, Truddie Crumble and [place].   Immunization History  Administered Date(s) Administered   Influenza Whole 07/19/2012   Influenza,inj,Quad PF,6+ Mos 09/15/2015, 10/17/2017   Influenza-Unspecified 08/30/2011   Moderna Sars-Covid-2 Vaccination 08/26/2020, 09/23/2020   Tdap 03/01/2011     Objective: Vital Signs: There were no vitals taken for this visit.   Physical Exam   Musculoskeletal Exam: ***  CDAI Exam: CDAI Score: -- Patient Global: --; Provider Global: -- Swollen: --; Tender: -- Joint Exam 02/04/2023   No joint exam has been documented for this visit   There is currently no information documented on the homunculus. Go to the Rheumatology activity and  complete the homunculus joint exam.  Investigation: No additional findings.  Imaging: No results found.  Recent Labs: Lab Results  Component Value Date   WBC 6.4 10/29/2022   HGB 13.3 10/29/2022   PLT 347 10/29/2022   NA 137 10/29/2022   K 4.4 10/29/2022   CL 105 10/29/2022   CO2 23 10/29/2022   GLUCOSE 77 10/29/2022   BUN 19 10/29/2022   CREATININE 0.86 10/29/2022   BILITOT 0.2 10/29/2022   ALKPHOS 80 09/01/2015   AST 15 10/29/2022   ALT 24 10/29/2022   PROT 7.0 10/29/2022   ALBUMIN 4.0 09/01/2015   CALCIUM 9.5 10/29/2022   GFRAA 106 10/24/2020   QFTBGOLDPLUS NEGATIVE 07/10/2021    Speciality Comments: No specialty comments available.  Procedures:  No procedures performed Allergies: Shrimp [shellfish allergy] and Other   Assessment / Plan:     Visit Diagnoses: No diagnosis found.  ***  Orders: No orders of the defined types were placed in this encounter.  No orders of the defined types were placed in this encounter.    Follow-Up Instructions: No follow-ups on file.   Collier Salina, MD  Note - This record has been created using [employer].  Chart creation errors have been  sought, but may not always  have been located. Such creation errors do not reflect on  the standard of medical care.

## 2023-02-04 ENCOUNTER — Ambulatory Visit: Payer: Medicare Other | Admitting: Internal Medicine

## 2023-02-11 ENCOUNTER — Telehealth: Payer: Self-pay | Admitting: Family Medicine

## 2023-02-11 NOTE — Telephone Encounter (Signed)
Patient called to ask how to wean herself off of topiramate (TOPAMAX) 25 MG tablet .  Requesting call back.  Please advise at [phone].

## 2023-03-21 ENCOUNTER — Other Ambulatory Visit (HOSPITAL_COMMUNITY): Payer: Self-pay | Admitting: Family Medicine

## 2023-03-21 ENCOUNTER — Encounter (HOSPITAL_COMMUNITY): Payer: Self-pay | Admitting: Family Medicine

## 2023-03-21 DIAGNOSIS — Z1231 Encounter for screening mammogram for malignant neoplasm of breast: Secondary | ICD-10-CM

## 2023-04-03 ENCOUNTER — Ambulatory Visit (HOSPITAL_COMMUNITY)
Admission: RE | Admit: 2023-04-03 | Discharge: 2023-04-03 | Disposition: A | Discharge status: Home / Self Care | Payer: Medicare Other | Source: Ambulatory Visit | Attending: Family Medicine | Admitting: Family Medicine

## 2023-04-03 ENCOUNTER — Encounter (HOSPITAL_COMMUNITY): Payer: Self-pay

## 2023-04-03 DIAGNOSIS — Z1231 Encounter for screening mammogram for malignant neoplasm of breast: Secondary | ICD-10-CM | POA: Insufficient documentation

## 2023-04-28 ENCOUNTER — Other Ambulatory Visit: Payer: Self-pay | Admitting: Internal Medicine

## 2023-04-28 ENCOUNTER — Telehealth: Payer: Self-pay | Admitting: Internal Medicine

## 2023-04-28 DIAGNOSIS — R002 Palpitations: Secondary | ICD-10-CM

## 2023-04-28 DIAGNOSIS — M0579 Rheumatoid arthritis with rheumatoid factor of multiple sites without organ or systems involvement: Secondary | ICD-10-CM

## 2023-04-28 NOTE — Telephone Encounter (Signed)
Received call transferred from operator and spoke with patient.  She reports she has a watch (similar to a Garmin) which has been alerting her of variations in heart rate since last Thursday.  Today at 9 AM her heart rate was 131 and then went to 52.  An hour later heart rate was 131 and then back down to 50's.  At lunch time heart rate went up to 186 and then down to 50.  Patient reports she doesn't feel well today--tired and weak.  She stopped taking Cardizem about 2 months ago because it was making her feel drugged out. Patient reports she has been having left shoulder pain for 3 days.  No pain when moving shoulder.  It is a sharp shooting pain. Lasts about 2-3 minutes and goes away on it's own.  She also has a rash on her left shoulder and abdomen.  Three recent tick bites.  Blistery type rash.  No fever Last night she woke up around 2:30 AM with pressure in the middle of her chest.  Lasted about 15-20 minutes and went away.   She is seeing PCP on 6/18 for evaluation of rash.  Will forward to Dr Izora Ribas for review/recommendations.  ED precautions reviewed with patient.

## 2023-04-28 NOTE — Telephone Encounter (Signed)
STAT if HR is under 50 or over 120 (normal HR is 60-100 beats per minute)  What is your heart rate? Got high as 186 and low as 52  Do you have a log of your heart rate readings (document readings)?    Do you have any other symptoms? Left shoulder/chest pain unsure which one is it. Tiredness

## 2023-04-29 ENCOUNTER — Ambulatory Visit: Payer: Medicare Other | Attending: Internal Medicine

## 2023-04-29 DIAGNOSIS — R002 Palpitations: Secondary | ICD-10-CM

## 2023-04-29 NOTE — Progress Notes (Unsigned)
Enrolled patient for a 7 day Zio XT monitor to be mailed to patients home.  

## 2023-04-29 NOTE — Telephone Encounter (Signed)
Called pt advised of MD recommendation: Called in with fast and slow heart rates, chest pressure and intolerance to diltiazem. Recent Tick Bite and PCP follow up is pending. Had negative stress test in 2022 with diagnosis of paroxysms SVT. - recommend repeat heart monitoring (one week non live ziopatch) to assess if symptoms are cardiac in nature.  She was lost to follow up; post monitor, would be reasonable follow up for our team  Thanks, MAC   Pt is agreeable verbally reviewed instructions for heart monitor.  Scheduled OV with [facility] Server for 07/01/23 at 9:15 am.

## 2023-05-02 DIAGNOSIS — R002 Palpitations: Secondary | ICD-10-CM

## 2023-05-06 ENCOUNTER — Ambulatory Visit (INDEPENDENT_AMBULATORY_CARE_PROVIDER_SITE_OTHER): Payer: Medicare Other | Admitting: Family Medicine

## 2023-05-06 VITALS — BP 120/62 | HR 74 | Temp 98.6°F | Ht 60.0 in | Wt 127.4 lb

## 2023-05-06 DIAGNOSIS — W57XXXA Bitten or stung by nonvenomous insect and other nonvenomous arthropods, initial encounter: Secondary | ICD-10-CM

## 2023-05-06 DIAGNOSIS — R509 Fever, unspecified: Secondary | ICD-10-CM | POA: Diagnosis not present

## 2023-05-06 LAB — COMPLETE METABOLIC PANEL WITH GFR
Albumin: 4.3 g/dL (ref 3.6–5.1)
BUN: 14 mg/dL (ref 7–25)
Chloride: 101 mmol/L (ref 98–110)
Creat: 0.81 mg/dL (ref 0.50–0.99)
Potassium: 4.2 mmol/L (ref 3.5–5.3)
Sodium: 136 mmol/L (ref 135–146)

## 2023-05-06 LAB — CBC WITH DIFFERENTIAL/PLATELET
Eosinophils Relative: 0.7 %
HCT: 38.8 % (ref 35.0–45.0)
Lymphs Abs: 1788 cells/uL (ref 850–3900)
Neutrophils Relative %: 64 %

## 2023-05-06 MED ORDER — METOPROLOL SUCCINATE ER 25 MG PO TB24
25.00 mg | ORAL_TABLET | Freq: Every day | ORAL | 3 refills | Status: DC
Start: 2023-05-06 — End: 2023-05-27

## 2023-05-06 NOTE — Progress Notes (Signed)
Subjective:    Patient ID: Theresa Washington, female    DOB: May 09, 1981, 42 y.o.   MRN: [id number]  Patient is a very sweet 42 year old Caucasian female who is here today due to fever.  She states that she went camping and fishing at the end of May.  They were in tall grass.  While fishing she acquired 3 tick bites.  One was located on the dorsum of her right forearm, another wound was behind her left knee, and another was on her lower left abdomen left lateral hip area.  None of these spots developed erythema migrans.  She never saw a red ring.  Today there is no visible tick bite.  However recently she states that she feels fatigued.  She also had 2 days of fever greater than 100.  However she has been afebrile now for more than 24 hours.  She denies any headache.  She denies any rash.  She denies any neck stiffness.  She does have what appears to be an insect bite in her left axilla.  She states that recently she has been having episodes of SVT where her heart rate will race to 180 bpm.  Cardiology is currently having her wear a Zio patch 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HCC)    Myositis    Ocular migraine    Palpitations    yrs ago - only one episode r/t stress   Palpitations 04/27/2014   PVC (premature ventricular contraction)    yrs ago - only one episode   Scoliosis    Seizures (HCC)    unknown etiolgy; no meds, last seizure was in 2014   Smoker 11/30/2015   quit 2015   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HCC)    Spondylosis    Past Surgical History:  Procedure Laterality Date   DILATION AND CURETTAGE OF UTERUS  11/18/1996   DILITATION & CURRETTAGE/HYSTROSCOPY WITH NOVASURE ABLATION N/A 01/16/2016   Procedure:  HYSTEROSCOPY WITH NOVASURE ABLATION;  Surgeon: Tilda Burrow, MD;  Location: [facility];  Service: Gynecology;  Laterality: N/A;  Uterine Cavity Length 4.0 cm Uterine Cavity Width 3.3cm Power 73  Time 1 minute 17seconds   HIP SURGERY Left 2021   LAPAROSCOPIC BILATERAL SALPINGECTOMY Bilateral 01/16/2016   Procedure: LAPAROSCOPIC BILATERAL SALPINGECTOMY;  Surgeon: Tilda Burrow, MD;  Location: [facility];  Service: Gynecology;  Laterality: Bilateral;  procedure 1   OOPHORECTOMY  11/18/2004   Left side   SHOULDER ARTHROSCOPY Left 06/04/2021   Left shoulder arthroscopy with biceps tenodesis mini open rotator cuff tear repair   SHOULDER ARTHROSCOPY Left 09/04/2021   Procedure: LEFT SHOULDER MANIPULATION UNDER ANESTHESIA, ARTHROSCOPY WITH ROTATOR INTERVAL RELEASE;  Surgeon: Cammy Copa, MD;  Location: [facility];  Service: Orthopedics;  Laterality: Left;   Current Outpatient Medications on File Prior to Visit  Medication Sig Dispense Refill   Multiple Vitamin (MULTIVITAMIN WITH MINERALS) TABS tablet Take 1 tablet by mouth daily.     Omega-3 Fatty Acids (FISH OIL) 1000 MG CAPS Take 2,000 mg by mouth daily.     rizatriptan (MAXALT) 10 MG tablet Take 1 tablet (10  mg total) by mouth as needed for migraine. May repeat in 2 hours if needed 10 tablet 3   clobetasol cream (TEMOVATE) 0.05 % Apply 1 application topically 2 (two) times daily as needed. (Patient not taking: Reported on 05/06/2023) 30 g 0   diclofenac sodium (VOLTAREN) 1 % GEL Apply 1 application topically as needed (pain). (Patient not taking: Reported on 05/06/2023)     diltiazem (CARDIZEM CD) 120 MG 24 hr capsule TAKE 1 CAPSULE(120 MG) BY MOUTH DAILY (Patient not taking: Reported on 10/29/2022) 90 capsule 2   EPINEPHrine (EPIPEN JR) 0.15 MG/0.3ML injection Inject 0.15 mg into the muscle as needed for anaphylaxis. (Patient not taking: Reported on 05/06/2023)     folic acid (FOLVITE) 1 MG tablet TAKE 1 TABLET(1 MG) BY MOUTH DAILY (Patient not  taking: Reported on 05/06/2023) 90 tablet 3   leflunomide (ARAVA) 10 MG tablet Take 1 tablet (10 mg total) by mouth daily. (Patient not taking: Reported on 05/06/2023) 30 tablet 1   meclizine (ANTIVERT) 25 MG tablet Take 1 tablet (25 mg total) by mouth 3 (three) times daily as needed for dizziness. May cause drowsiness (Patient not taking: Reported on 10/29/2022) 30 tablet 0   methylcellulose (ARTIFICIAL TEARS) 1 % ophthalmic solution Place 1 drop into both eyes daily as needed. Dry Eyes (Patient not taking: Reported on 05/06/2023)     Misc Natural Products (GLUCOSAMINE CHONDROITIN TRIPLE) TABS Take 1 tablet by mouth 2 (two) times daily. (Patient not taking: Reported on 05/06/2023)     topiramate (TOPAMAX) 25 MG tablet TAKE 2 TABLETS(50 MG) BY MOUTH TWICE DAILY (Patient not taking: Reported on 05/06/2023) 360 tablet 1   No current facility-administered medications on file prior to visit.   Allergies  Allergen Reactions   Shrimp [Shellfish Allergy] Anaphylaxis, Hives and Swelling    Pt states "I carry an Epipen. I had anaphylactic reaction to something but I don't know what".     Hydromorphone Nausea And Vomiting   Other     Adhesive tape caused blisters. Okay to use paper tape.   Social History   Socioeconomic History   Marital status: Married    Spouse name: [name]   Number of children: 2   Years of education: Not on file   Highest education level: Associate degree: occupational, technical, or vocational program  Occupational History   Not on file  Tobacco Use   Smoking status: Former    Packs/day: 1.00    Years: 20.00    Additional pack years: 0.00    Total pack years: 20.00    Types: Cigarettes    Quit date: 09/12/2016    Years since quitting: 6.6    Passive exposure: Past   Smokeless tobacco: Never  Vaping Use   Vaping Use: Every day   Substances: Nicotine, CBD, Flavoring   Devices: [employer]  Substance and Sexual Activity   Alcohol use: Yes    Comment: socially    Drug use: Never   Sexual activity: Yes    Birth control/protection: Surgical    Comment: tubal ligation and ablation  Other Topics Concern   Not on file  Social History Narrative   2 sons, Theresa Washington and Theresa Washington.   Social Determinants of Health   Financial Resource Strain: Low Risk  (05/06/2023)   Overall Financial Resource Strain (CARDIA)    Difficulty of Paying Living Expenses: Not hard at all  Food Insecurity: No Food Insecurity (05/06/2023)   Hunger Vital Sign    Worried About Running  Out of Food in the Last Year: Never true    Ran Out of Food in the Last Year: Never true  Transportation Needs: No Transportation Needs (05/06/2023)   PRAPARE - Administrator, Civil Service (Medical): No    Lack of Transportation (Non-Medical): No  Physical Activity: Sufficiently Active (05/06/2023)   Exercise Vital Sign    Days of Exercise per Week: 3 days    Minutes of Exercise per Session: 60 min  Stress: No Stress Concern Present (05/06/2023)   [employer] of Occupational Health - Occupational Stress Questionnaire    Feeling of Stress : Only a little  Social Connections: Socially Integrated (05/06/2023)   Social Connection and Isolation Panel [NHANES]    Frequency of Communication with Friends and Family: More than three times a week    Frequency of Social Gatherings with Friends and Family: Once a week    Attends Religious Services: More than 4 times per year    Active Member of [employer] or Organizations: No    Attends Engineer, structural: More than 4 times per year    Marital Status: Married  Catering manager Violence: Not At Risk (05/30/2022)   Humiliation, Afraid, Rape, and Kick questionnaire    Fear of Current or Ex-Partner: No    Emotionally Abused: No    Physically Abused: No    Sexually Abused: No       Review of Systems  All other systems reviewed and are negative.      Objective:   Physical Exam Vitals reviewed.  Constitutional:      General: She is  not in acute distress.    Appearance: She is well-developed. She is not diaphoretic.  Cardiovascular:     Rate and Rhythm: Normal rate and regular rhythm.     Heart sounds: Murmur heard.     Systolic murmur is present with a grade of 3/6.  Pulmonary:     Effort: Pulmonary effort is normal. No respiratory distress.     Breath sounds: Normal breath sounds. No wheezing or rales.  Abdominal:     General: Bowel sounds are normal. There is no distension.     Palpations: Abdomen is soft.     Tenderness: There is no abdominal tenderness. There is no guarding or rebound.  Skin:    General: Skin is warm and dry.         Assessment & Plan:  Tick bite, unspecified site, initial encounter - Plan: B. burgdorfi antibodies by WB, [place] [facility] spotted fvr abs pnl(IgG+IgM), CBC with Differential/Platelet, COMPLETE METABOLIC PANEL WITH GFR  Fever, unspecified fever cause - Plan: B. burgdorfi antibodies by WB, [place] spotted fvr abs pnl(IgG+IgM), CBC with Differential/Platelet, COMPLETE METABOLIC PANEL WITH GFR I see no clinical evidence of Lyme disease.  Symptoms do not sound consistent with [facility] spotted fever other than the fever.  Today the patient is nontoxic and healthy-appearing.  Therefore I do not see convincing indication to start her empirically on antibiotics.  I will check a CBC.  If the patient has leukopenia or thrombocytopenia or elevated liver function test, I will likely start her on doxycycline.  I will also check Lyme titers and [facility] spotted fever titers.  If all the labs are normal, I would recommend clinical monitoring given how healthy she appears today and the fact the fever is gone away.  We will start the patient on Toprol-XL 25 mg daily due to the frequency and severity of her episodes  of tachycardia to see if we can help prevent this

## 2023-05-08 LAB — CBC WITH DIFFERENTIAL/PLATELET
Basophils Absolute: 41 cells/uL (ref 0–200)
RDW: 12.2 % (ref 11.0–15.0)

## 2023-05-08 LAB — COMPLETE METABOLIC PANEL WITH GFR
AST: 25 U/L (ref 10–30)
Globulin: 2.9 g/dL (calc) (ref 1.9–3.7)
eGFR: 93 mL/min/{1.73_m2} (ref >=60)

## 2023-05-08 LAB — ROCKY MTN SPOTTED FVR ABS PNL(IGG+IGM): RMSF IgG: NOT DETECTED

## 2023-05-09 LAB — B. BURGDORFI ANTIBODIES BY WB
B burgdorferi IgG Abs (IB): NEGATIVE
B burgdorferi IgM Abs (IB): NEGATIVE
Lyme Disease 18 kD IgG: NONREACTIVE
Lyme Disease 23 kD IgG: NONREACTIVE
Lyme Disease 23 kD IgM: NONREACTIVE
Lyme Disease 28 kD IgG: NONREACTIVE
Lyme Disease 30 kD IgG: NONREACTIVE
Lyme Disease 39 kD IgG: NONREACTIVE
Lyme Disease 39 kD IgM: NONREACTIVE
Lyme Disease 41 kD IgG: NONREACTIVE
Lyme Disease 41 kD IgM: NONREACTIVE
Lyme Disease 45 kD IgG: NONREACTIVE
Lyme Disease 58 kD IgG: NONREACTIVE
Lyme Disease 66 kD IgG: REACTIVE — AB
Lyme Disease 93 kD IgG: NONREACTIVE

## 2023-05-09 LAB — CBC WITH DIFFERENTIAL/PLATELET
Absolute Monocytes: 571 cells/uL (ref 200–950)
Basophils Relative: 0.6 %
Eosinophils Absolute: 48 cells/uL (ref 15–500)
Hemoglobin: 12.8 g/dL (ref 11.7–15.5)
MCH: 30.5 pg (ref 27.0–33.0)
MCHC: 33 g/dL (ref 32.0–36.0)
MCV: 92.4 fL (ref 80.0–100.0)
MPV: 9.9 fL (ref 7.5–12.5)
Monocytes Relative: 8.4 %
Neutro Abs: 4352 cells/uL (ref 1500–7800)
Platelets: 297 10*3/uL (ref 140–400)
RBC: 4.2 10*6/uL (ref 3.80–5.10)
Total Lymphocyte: 26.3 %
WBC: 6.8 10*3/uL (ref 3.8–10.8)

## 2023-05-09 LAB — COMPLETE METABOLIC PANEL WITH GFR
AG Ratio: 1.5 (calc) (ref 1.0–2.5)
ALT: 33 U/L — ABNORMAL HIGH (ref 6–29)
Alkaline phosphatase (APISO): 47 U/L (ref 31–125)
CO2: 28 mmol/L (ref 20–32)
Calcium: 9.5 mg/dL (ref 8.6–10.2)
Glucose, Bld: 80 mg/dL (ref 65–99)
Total Bilirubin: 0.5 mg/dL (ref 0.2–1.2)
Total Protein: 7.2 g/dL (ref 6.1–8.1)

## 2023-05-09 LAB — ROCKY MTN SPOTTED FVR ABS PNL(IGG+IGM): RMSF IgM: NOT DETECTED

## 2023-05-14 ENCOUNTER — Other Ambulatory Visit: Payer: Self-pay

## 2023-05-14 ENCOUNTER — Emergency Department (HOSPITAL_COMMUNITY)
Admission: EM | Admit: 2023-05-14 | Discharge: 2023-05-14 | Disposition: A | Discharge status: Home / Self Care | Payer: Medicare Other | Attending: Emergency Medicine | Admitting: Emergency Medicine

## 2023-05-14 ENCOUNTER — Telehealth: Payer: Self-pay

## 2023-05-14 ENCOUNTER — Encounter (HOSPITAL_COMMUNITY): Payer: Self-pay

## 2023-05-14 DIAGNOSIS — R42 Dizziness and giddiness: Secondary | ICD-10-CM | POA: Diagnosis not present

## 2023-05-14 DIAGNOSIS — R55 Syncope and collapse: Secondary | ICD-10-CM | POA: Insufficient documentation

## 2023-05-14 DIAGNOSIS — R002 Palpitations: Secondary | ICD-10-CM | POA: Diagnosis not present

## 2023-05-14 DIAGNOSIS — Z6823 Body mass index (BMI) 23.0-23.9, adult: Secondary | ICD-10-CM | POA: Diagnosis not present

## 2023-05-14 DIAGNOSIS — R0789 Other chest pain: Secondary | ICD-10-CM | POA: Diagnosis not present

## 2023-05-14 DIAGNOSIS — R001 Bradycardia, unspecified: Secondary | ICD-10-CM | POA: Insufficient documentation

## 2023-05-14 DIAGNOSIS — J449 Chronic obstructive pulmonary disease, unspecified: Secondary | ICD-10-CM | POA: Insufficient documentation

## 2023-05-14 LAB — URINALYSIS, ROUTINE W REFLEX MICROSCOPIC
Bacteria, UA: NONE SEEN
Bilirubin Urine: NEGATIVE
Glucose, UA: NEGATIVE mg/dL
Hgb urine dipstick: NEGATIVE
Ketones, ur: NEGATIVE mg/dL
Nitrite: NEGATIVE
Protein, ur: NEGATIVE mg/dL
Specific Gravity, Urine: 1.001 — ABNORMAL LOW (ref 1.005–1.030)
pH: 7 (ref 5.0–8.0)

## 2023-05-14 LAB — CBC
HCT: 40.2 % (ref 36.0–46.0)
Hemoglobin: 13.6 g/dL (ref 12.0–15.0)
MCH: 31.4 pg (ref 26.0–34.0)
MCHC: 33.8 g/dL (ref 30.0–36.0)
MCV: 92.8 fL (ref 80.0–100.0)
Platelets: 321 K/uL (ref 150–400)
RBC: 4.33 MIL/uL (ref 3.87–5.11)
RDW: 12.1 % (ref 11.5–15.5)
WBC: 5.3 K/uL (ref 4.0–10.5)
nRBC: 0 % (ref 0.0–0.2)

## 2023-05-14 LAB — BASIC METABOLIC PANEL WITH GFR
Anion gap: 9 (ref 5–15)
BUN: 14 mg/dL (ref 6–20)
CO2: 25 mmol/L (ref 22–32)
Calcium: 9.3 mg/dL (ref 8.9–10.3)
Chloride: 101 mmol/L (ref 98–111)
Creatinine, Ser: 0.85 mg/dL (ref 0.44–1.00)
GFR, Estimated: >60 mL/min
Glucose, Bld: 97 mg/dL (ref 70–99)
Potassium: 3.5 mmol/L (ref 3.5–5.1)
Sodium: 135 mmol/L (ref 135–145)

## 2023-05-14 LAB — CBG MONITORING, ED: Glucose-Capillary: 83 mg/dL (ref 70–99)

## 2023-05-14 LAB — PREGNANCY, URINE: Preg Test, Ur: NEGATIVE

## 2023-05-14 NOTE — Telephone Encounter (Signed)
FYI:  Pt called this morning regarding medication: Metoprolol succinate 25mg  for PSVT  This morning pt called stating that per pcp's directions for pt to call into office to address any changes while taking medication (above). Pt stated that today her HR has been running anywhere from 107,104 and 52. Pt stated that she also feels light-headed, sweaty palms, feels like she is going to pass out and just not feeling good at all.   I, ask pt if she took her blood pressure during these situations and per pt stated 'no' because she don't have one. Advise pt to sit down and have some fluids and a few saltine crackers/per pt has sat&vinegar chips to see if it will help, since we don't know for sure if her it's her BP or not. Told pt that if her symptoms continues and she is still not feeling good, then for her to call EMS to take her to UC/ED since she can drive. Plus we have no providers in this week. Pt voiced understanding and will keep office updated.

## 2023-05-14 NOTE — ED Notes (Signed)
Pt taken to the restroom.   

## 2023-05-14 NOTE — ED Triage Notes (Signed)
Pt comes in after being seen at [facility] for near syncope episode. Pt states hx of palpitations and was put on Metoprolol for the past 4 day with no problems. Today pt experienced when standing lightheadedness, dizziness, and feelings of passing out.

## 2023-05-14 NOTE — Discharge Instructions (Signed)
The workup in the emergency room is reassuring.  No evidence of any arrhythmia noted.  At this time, with the recommendation is for you to continue taking your medications.  If you have repeat episodes, please check your heart rate and record it.  If there are repeat episodes of such events, please notify cardiology soon as possible to see if medication adjustment is needed.  Return to the emergency room if you start having severe symptoms such as dizziness, near fainting, severe chest pain or shortness of breath.

## 2023-05-14 NOTE — ED Provider Notes (Signed)
[place] EMERGENCY DEPARTMENT AT [facility] Provider Note   CSN: [id number] Arrival date & time: 05/14/23  1229     History  Chief Complaint  Patient presents with   Near Syncope    Theresa Washington is a 42 y.o. female.  HPI    Patient comes in with chief complaint of dizziness. Patient has known history of PSVT.  She was started on Toprol-XL few days back.  PSVT was diagnosed several months ago and patient was initially on diltiazem, she was unable to tolerate it and discontinued it.  More recently her cardiologist put her on Toprol cell.  Patient states that this morning she woke up, sat up from her bed and just felt some palpitations.  She carried on with her activity, but about an hour later while she was grabbing her coffee, she started having palpitations with dizziness and clammy feeling.  She had near fainting experience at that time.  In total, patient has had about 3 more episodes since then, every 1-2 hours, unprovoked but typically when she is doing something exertional.  She did take her Toprol-XL at 10:30 AM as she normally does.  Patient went to the urgent care earlier, they advised that she come to the emergency room.  Pt has no hx of PE, DVT and denies any exogenous hormone (testosterone / estrogen) use, long distance travels or surgery in the past 6 weeks, active cancer, recent immobilization.   Home Medications Prior to Admission medications   Medication Sig Start Date End Date Taking? Authorizing Provider  clobetasol cream (TEMOVATE) 0.05 % Apply 1 application topically 2 (two) times daily as needed. Patient not taking: Reported on 05/06/2023 11/21/21   Fuller Plan, MD  diclofenac sodium (VOLTAREN) 1 % GEL Apply 1 application topically as needed (pain). Patient not taking: Reported on 05/06/2023    [provider]  diltiazem (CARDIZEM CD) 120 MG 24 hr capsule TAKE 1 CAPSULE(120 MG) BY MOUTH DAILY Patient not taking: Reported on  10/29/2022 07/10/22   Riley Lam A, MD  EPINEPHrine (EPIPEN JR) 0.15 MG/0.3ML injection Inject 0.15 mg into the muscle as needed for anaphylaxis. Patient not taking: Reported on 05/06/2023    [provider]  folic acid (FOLVITE) 1 MG tablet TAKE 1 TABLET(1 MG) BY MOUTH DAILY Patient not taking: Reported on 05/06/2023 03/10/22   Fuller Plan, MD  leflunomide (ARAVA) 10 MG tablet Take 1 tablet (10 mg total) by mouth daily. Patient not taking: Reported on 05/06/2023 10/29/22   Fuller Plan, MD  meclizine (ANTIVERT) 25 MG tablet Take 1 tablet (25 mg total) by mouth 3 (three) times daily as needed for dizziness. May cause drowsiness Patient not taking: Reported on 10/29/2022 06/14/22   Particia Nearing, PA-C  methylcellulose (ARTIFICIAL TEARS) 1 % ophthalmic solution Place 1 drop into both eyes daily as needed. Dry Eyes Patient not taking: Reported on 05/06/2023    [provider]  metoprolol succinate (TOPROL-XL) 25 MG 24 hr tablet Take 1 tablet (25 mg total) by mouth daily. 05/06/23   Donita Brooks, MD  Misc Natural Products (GLUCOSAMINE CHONDROITIN TRIPLE) TABS Take 1 tablet by mouth 2 (two) times daily. Patient not taking: Reported on 05/06/2023    [provider]  Multiple Vitamin (MULTIVITAMIN WITH MINERALS) TABS tablet Take 1 tablet by mouth daily.    [provider]  Omega-3 Fatty Acids (FISH OIL) 1000 MG CAPS Take 2,000 mg by mouth daily.    [provider]  rizatriptan (MAXALT) 10 MG tablet Take 1 tablet (10 mg total) by mouth as needed for migraine. May repeat in 2 hours if needed 08/06/21   Donita Brooks, MD  topiramate (TOPAMAX) 25 MG tablet TAKE 2 TABLETS(50 MG) BY MOUTH TWICE DAILY Patient not taking: Reported on 05/06/2023 11/04/22   Donita Brooks, MD      Allergies    Shrimp [shellfish allergy], Hydromorphone, and Other    Review of Systems   Review of Systems  All other systems reviewed and are  negative.   Physical Exam Updated Vital Signs BP 124/73   Pulse (!) 52   Temp 98 F (36.7 C) (Oral)   Resp (!) 9 Comment: Simultaneous filing. User may not have seen previous data.  Ht 5' (1.524 m)   Wt 55.3 kg   SpO2 100%   BMI 23.83 kg/m  Physical Exam Vitals and nursing note reviewed.  Constitutional:      Appearance: She is well-developed.  HENT:     Head: Atraumatic.  Cardiovascular:     Rate and Rhythm: Bradycardia present.  Pulmonary:     Effort: Pulmonary effort is normal.  Musculoskeletal:     Cervical back: Normal range of motion and neck supple.     Right lower leg: No edema.     Left lower leg: No edema.  Skin:    General: Skin is warm and dry.  Neurological:     Mental Status: She is alert and oriented to person, place, and time.     ED Results / Procedures / Treatments   Labs (all labs ordered are listed, but only abnormal results are displayed) Labs Reviewed  URINALYSIS, ROUTINE W REFLEX MICROSCOPIC - Abnormal; Notable for the following components:      Result Value   Color, Urine COLORLESS (*)    Specific Gravity, Urine 1.001 (*)    Leukocytes,Ua TRACE (*)    All other components within normal limits  BASIC METABOLIC PANEL  CBC  PREGNANCY, URINE  CBG MONITORING, ED    EKG EKG Interpretation  Date/Time:  Wednesday May 14 2023 12:40:55 EDT Ventricular Rate:  66 PR Interval:  144 QRS Duration: 96 QT Interval:  399 QTC Calculation: 418 R Axis:   73 Text Interpretation: Sinus rhythm RSR' in V1 or V2, probably normal variant not new No acute changes No significant change since last tracing Confirmed by Derwood Kaplan [phone]) on 05/14/2023 1:09:28 PM  Radiology No results found.  Procedures Procedures    Medications Ordered in ED Medications - No data to display  ED Course/ Medical Decision Making/ A&P                             Medical Decision Making Amount and/or Complexity of Data Reviewed Labs: ordered.  Patient with  history of COPD, rheumatoid arthritis, PSVT and von Willebrand's disease comes in with chief complaint of palpitations with some chest discomfort and near fainting earlier today.  I have reviewed patient's chart, including notes from PCP and cardiology and reviewed patient's medications.  Differential diagnosis for her includes PSVT, atrial arrhythmia, medication toxic side effects leading to bradycardia and near syncope, electrolyte abnormality.  PE also considered in the differential, but deemed to be less likely as patient currently bradycardic.  She does not have any major PE risk factors.  Plan is to monitor the patient on telemetry for the next 3 to 4 hours.  She did take her  Toprol-XL at 10:30 AM, therefore it is possible that if there was underlying PSVT, the might be blunted now.  Reassessment: Patient's basic labs are reassuring including metabolic profile. I have independently interpreted patient's cardiac telemetry monitoring strip.  Over the last 3-1/2 hours, patient has not had any episodes of tacky dysrhythmia.  She has had episodes of heart rate in the 50s.  Results discussed with the patient.  Plan is to continue on with the current management.  She will keep track of her heart rate, especially when she is symptomatic.  Return precautions discussed.  Final Clinical Impression(s) / ED Diagnoses Final diagnoses:  Near syncope  Palpitations    Rx / DC Orders ED Discharge Orders     None         Derwood Kaplan, MD 05/15/23 [phone]

## 2023-05-15 ENCOUNTER — Telehealth: Payer: Self-pay

## 2023-05-15 DIAGNOSIS — R002 Palpitations: Secondary | ICD-10-CM | POA: Diagnosis not present

## 2023-05-15 NOTE — Transitions of Care (Post Inpatient/ED Visit) (Signed)
05/15/2023  Name: Theresa Washington MRN: [id number] DOB: 1981/10/12  Today's TOC FU Call Status: Today's TOC FU Call Status:: Successful TOC FU Call Competed TOC FU Call Complete Date: 05/15/23  Transition Care Management Follow-up Telephone Call Date of Discharge: 05/14/23 [facility] Type of Discharge: Emergency Department Reason for ED Visit: Other: (Near syncope) How have you been since you were released from the hospital?: Better Any questions or concerns?: No  Items Reviewed: Did you receive and understand the discharge instructions provided?: Yes Medications obtained,verified, and reconciled?: Yes (Medications Reviewed) Any new allergies since your discharge?: No Dietary orders reviewed?: Yes Do you have support at home?: Yes  Medications Reviewed Today: Medications Reviewed Today     Reviewed by Merleen Nicely, LPN (Licensed Practical Nurse) on 05/15/23 at 1033  Med List Status: <None>   Medication Order Taking? Sig Documenting Provider Last Dose Status Informant  Biotin 5000 MCG CAPS [id number] Yes Take 1 capsule by mouth daily. [provider] Taking Active   Cholecalciferol (VITAMIN D-3) 125 MCG (5000 UT) TABS [id number] Yes Take 1 tablet by mouth 1 day or 1 dose. [provider] Taking Active   clobetasol cream (TEMOVATE) 0.05 % [id number] No Apply 1 application topically 2 (two) times daily as needed.  Patient not taking: Reported on 05/06/2023   Fuller Plan, MD Not Taking Active   diclofenac sodium (VOLTAREN) 1 % GEL [id number] No Apply 1 application topically as needed (pain).  Patient not taking: Reported on 05/06/2023   [provider] Not Taking Active Self  diltiazem (CARDIZEM CD) 120 MG 24 hr capsule [id number] No TAKE 1 CAPSULE(120 MG) BY MOUTH DAILY  Patient not taking: Reported on 10/29/2022   Christell Constant, MD Not Taking Active   EPINEPHrine (EPIPEN JR) 0.15 MG/0.3ML injection  [id number] No Inject 0.15 mg into the muscle as needed for anaphylaxis.  Patient not taking: Reported on 05/06/2023   [provider] Not Taking Active Self  folic acid (FOLVITE) 1 MG tablet [id number]  TAKE 1 TABLET(1 MG) BY MOUTH DAILY  Patient not taking: Reported on 05/06/2023   Fuller Plan, MD  Active   leflunomide (ARAVA) 10 MG tablet [id number] No Take 1 tablet (10 mg total) by mouth daily.  Patient not taking: Reported on 05/06/2023   Fuller Plan, MD Not Taking Active   meclizine (ANTIVERT) 25 MG tablet [id number] No Take 1 tablet (25 mg total) by mouth 3 (three) times daily as needed for dizziness. May cause drowsiness  Patient not taking: Reported on 10/29/2022   Particia Nearing, PA-C Not Taking Active   Menatetrenone (VITAMIN K2) 100 MCG TABS [id number] Yes Take 1 tablet by mouth daily. [provider] Taking Active   methylcellulose (ARTIFICIAL TEARS) 1 % ophthalmic solution [id number] No Place 1 drop into both eyes daily as needed. Dry Eyes  Patient not taking: Reported on 05/06/2023   [provider] Not Taking Active Self  metoprolol succinate (TOPROL-XL) 25 MG 24 hr tablet [id number] Yes Take 1 tablet (25 mg total) by mouth daily. Donita Brooks, MD Taking Active   Misc Natural Products (GLUCOSAMINE CHONDROITIN TRIPLE) TABS [id number] No Take 1 tablet by mouth 2 (two) times daily.  Patient not taking: Reported on 05/06/2023   [provider] Not Taking Active Self  Multiple Vitamin (MULTIVITAMIN WITH MINERALS) TABS tablet [id number] Yes Take 1 tablet by mouth daily. [provider] Taking Active Self  Omega-3 Fatty Acids (FISH OIL) 1000  MG CAPS [id number] Yes Take 2,000 mg by mouth daily. [provider] Taking Active Self  rizatriptan (MAXALT) 10 MG tablet [id number] Yes Take 1 tablet (10 mg total) by mouth as needed for migraine. May repeat in 2 hours if needed Donita Brooks, MD Taking Active Self  topiramate  (TOPAMAX) 25 MG tablet [id number] No TAKE 2 TABLETS(50 MG) BY MOUTH TWICE DAILY  Patient not taking: Reported on 05/06/2023   Donita Brooks, MD Not Taking Active             Home Care and Equipment/Supplies: Were Home Health Services Ordered?: No Any new equipment or medical supplies ordered?: No  Functional Questionnaire: Do you need assistance with bathing/showering or dressing?: No Do you need assistance with meal preparation?: No Do you need assistance with eating?: No Do you have difficulty maintaining continence: No Do you need assistance with getting out of bed/getting out of a chair/moving?: No Do you have difficulty managing or taking your medications?: No  Follow up appointments reviewed: PCP Follow-up appointment confirmed?: No (pt will fu with cardiologist) MD Provider Line Number:[phone] Given: Yes Specialist Hospital Follow-up appointment confirmed?: No Reason Specialist Follow-Up Not Confirmed: Patient has Specialist Provider Number and will Call for Appointment Do you need transportation to your follow-up appointment?: No Do you understand care options if your condition(s) worsen?: Yes-patient verbalized understanding    SIGNATURE  Woodfin Ganja LPN [facility] Nurse Health Advisor Direct Dial [phone]

## 2023-05-20 ENCOUNTER — Telehealth: Payer: Self-pay | Admitting: Internal Medicine

## 2023-05-20 NOTE — Telephone Encounter (Signed)
Pt c/o medication issue:  1. Name of Medication: Metoprolol  2. How are you currently taking this medication (dosage and times per day)?   3. Are you having a reaction (difficulty breathing--STAT)?   4. What is your medication issue? Patient was supposed to have taken this medicine at 10:00 or 10:30 Am. She did not take it. She wants to know what time can she take I?     Patient said she had to go to the ER at [facility] on last Thursday(05-15-23) for low heart rate. She said she was told at the ER to let Dr Izora Ribas know. They said he might wanted to see her sooner and might want to change her medicine.

## 2023-05-20 NOTE — Telephone Encounter (Signed)
Pt was returning nurse call and is requesting a callback. Please advise. 

## 2023-05-20 NOTE — Telephone Encounter (Signed)
Called pt who reports stopped taking diltiazem d/t it causing fatigue.  PCP started metoprolol succinate 25 mg d/t elevated HR.  Pt reports started metoprolol last Monday.  On Wednesday felt bad went to  [facility] ED for low HR.  Reports felt bad. Reports HR would go between 50-140's. Had 3-4 runs of PSVT while in ED. Per pt ED did not make medication change.  Advised pt to f/u with Cardiology.   Pt reports HR jumps from 65  to 106 in a matter of seconds.  Does not have a way to check BP at home.  ED visit BP 124/73. Missed metoprolol dose today at 10 am.  HR while on phone above 100.  Pt hesitant to take metoprolol advised to take at least half of dose.  Pt is agreeable.  Advised to return to ED if symptoms worsen.  Will send to MD to advise. Last heart monitor resulted 05/15/23.

## 2023-05-20 NOTE — Telephone Encounter (Signed)
Left a message to call back.

## 2023-05-21 NOTE — Telephone Encounter (Signed)
Called pt advised of MD response: Patient called in with symptoms of palpitations (fast and slow heart rates).  Recent heart monitor did not show significant evidence of SVT (had 4 asymptomatic beats).  Her symptoms occurred during sinus rhythm and sinus tachycardia.  Symptoms may not be cardiac in origin.  Would bring in for acute visit (APP, me if there is an availability, DOD).   Scheduled OV for 05/27/23 at 7:45 am.  Advised if has acute needs to call 911 for urgent evaluation.

## 2023-05-26 NOTE — Progress Notes (Unsigned)
Cardiology Office Note:  .   Date:  05/27/2023  ID:  Theresa Washington, DOB 12-13-1980, MRN [id number] PCP: Donita Brooks, MD  [facility] Providers Cardiologist:  Christell Constant, MD    History of Present Illness: .   Theresa Washington is a 42 y.o. female with history of with a hx of von Willebrand's Disease, HTN, recurrent palpitations, vasovagal syncope former tobacco user and mitral regurgitation, PSVT  Seen by Dr. Izora Ribas 11/2020 for possible exercise related syncope. Follow up POET 12/2020 without evidence of ischemia at given workload. No exercise induced arrhythmias. Echo 12/2020 showed LVEF of 60-65%. Mild to moderate MR.    Seen by PCP 03/01/22 for lightheadedness and near syncope. No associated palpitations. Suspected vasovagal episode.  Patient called in 04/2023 with increase palpitiations and monitor showed isolated PAC's, PVC's and 4 beat SVT  Patient was in ED 05/14/23 with increase palpitations, labs stable and no arrhythmias.   Patient didn't feel good on diltiazem and PCP changed her to Toprol xl 25 mg daily. She still has some palpitations with HR's up to 130's 3-4 times a day off/on 15-20 min. When in NSR pulse is 60-78. She goes to the gym 3 times a week-does weights and cardio-1 1/2 miles on treadmill.   ROS:    Studies Reviewed: [name]    Prior CV Studies: LONG TERM MONITOR (3-7 DAYS) INTERPRETATION 05/15/2023  Narrative   Patient had a minimum heart rate of 50 bpm, maximum heart rate of 136 bpm, and average heart rate of 74 bpm.   Predominant underlying rhythm was sinus rhythm.   One short run of paroxysmal SVT  lasting 4 beats at longest with a max rate of 129 bpm at fastest.   Isolated PACs were rare (<1.0%).   Isolated PVCs were rare (<1.0%).   Triggered and diary events associated with sinus rhythm or sinus tachycardia.  No malignant arrhythmias.     Echo 03/05/2022  1. Left ventricular ejection fraction, by estimation, is 60 to 65%.  The  left ventricle has normal function. The left ventricle has no regional  wall motion abnormalities. Left ventricular diastolic parameters were  normal.   2. Right ventricular systolic function is normal. The right ventricular  size is normal. There is normal pulmonary artery systolic pressure.   3. The mitral valve is normal in structure. Mild mitral valve  regurgitation. No evidence of mitral stenosis.   4. The aortic valve is normal in structure. Aortic valve regurgitation is  not visualized. No aortic stenosis is present.   5. The inferior vena cava is normal in size with greater than 50%  respiratory variability, suggesting right atrial pressure of 3 mmHg   Mitral Valve: The mitral valve is normal in structure. Mild mitral valve  regurgitation. No evidence of mitral valve stenosis. MV peak gradient,  11.7 mmHg. The mean mitral valve gradient is 3.0 mmHg.   Risk Assessment/Calculations:             Physical Exam:   VS:  BP 110/66   Pulse 70   Ht 5' (1.524 m)   Wt 129 lb (58.5 kg)   BMI 25.19 kg/m    Wt Readings from Last 3 Encounters:  05/27/23 129 lb (58.5 kg)  05/14/23 122 lb (55.3 kg)  05/06/23 127 lb 6.4 oz (57.8 kg)    GEN: Well nourished, well developed in no acute distress NECK: No JVD; No carotid bruits CARDIAC:  RRR, 1/6 systolic murmur LSB,no rubs, gallops  RESPIRATORY:  Clear to auscultation without rales, wheezing or rhonchi  ABDOMEN: Soft, non-tender, non-distended EXTREMITIES:  No edema; No deformity   ASSESSMENT AND PLAN: .     PSVT on monitor-doing better on Toprol but still having daily palpitations. Increase Toprol xl 25 mg am 12.5 mg pm and if still having palpitations and BP tolerates increase 25 mg bid.  MR-mild        Dispo: F/u in 1 yr   Signed, Jacolyn Reedy, PA-C

## 2023-05-27 ENCOUNTER — Encounter: Payer: Self-pay | Admitting: Physician Assistant

## 2023-05-27 ENCOUNTER — Ambulatory Visit: Payer: Medicare Other | Attending: Physician Assistant | Admitting: Physician Assistant

## 2023-05-27 VITALS — BP 110/66 | HR 70 | Ht 60.0 in | Wt 129.0 lb

## 2023-05-27 DIAGNOSIS — I471 Supraventricular tachycardia, unspecified: Secondary | ICD-10-CM | POA: Diagnosis not present

## 2023-05-27 DIAGNOSIS — I340 Nonrheumatic mitral (valve) insufficiency: Secondary | ICD-10-CM

## 2023-05-27 DIAGNOSIS — R002 Palpitations: Secondary | ICD-10-CM

## 2023-05-27 MED ORDER — METOPROLOL SUCCINATE ER 25 MG PO TB24: ORAL_TABLET | ORAL | 3 refills | Status: DC

## 2023-05-27 NOTE — Patient Instructions (Signed)
Medication Instructions:  Your physician has recommended you make the following change in your medication:  START TO INCREASE TOPROL TO 25 MG IN THE AM AND 12.5 MG IN THE PM, THEN INCREASE TO 25 MG TWICE DAILY.   *If you need a refill on your cardiac medications before your next appointment, please call your pharmacy*   Lab Work: NONE If you have labs (blood work) drawn today and your tests are completely normal, you will receive your results only by: MyChart Message (if you have MyChart) OR A paper copy in the mail If you have any lab test that is abnormal or we need to change your treatment, we will call you to review the results.   Testing/Procedures: NONE   Follow-Up: At [facility], you and your health needs are our priority.  As part of our continuing mission to provide you with exceptional heart care, we have created designated Provider Care Teams.  These Care Teams include your primary Cardiologist (physician) and Advanced Practice Providers (APPs -  Physician Assistants and Nurse Practitioners) who all work together to provide you with the care you need, when you need it.  We recommend signing up for the patient portal called "MyChart".  Sign up information is provided on this After Visit Summary.  MyChart is used to connect with patients for Virtual Visits (Telemedicine).  Patients are able to view lab/test results, encounter notes, upcoming appointments, etc.  Non-urgent messages can be sent to your provider as well.   To learn more about what you can do with MyChart, go to [email or web address].    Your next appointment:   1 year(s)  Provider:   Christell Constant, MD

## 2023-06-05 ENCOUNTER — Ambulatory Visit (INDEPENDENT_AMBULATORY_CARE_PROVIDER_SITE_OTHER): Payer: Medicare Other

## 2023-06-05 VITALS — BP 108/62 | Ht 60.0 in | Wt 129.0 lb

## 2023-06-05 DIAGNOSIS — Z0000 Encounter for general adult medical examination without abnormal findings: Secondary | ICD-10-CM

## 2023-06-05 NOTE — Patient Instructions (Addendum)
[name] , Thank you for taking time to come for your Medicare Wellness Visit. I appreciate your ongoing commitment to your health goals. Please review the following plan we discussed and let me know if I can assist you in the future.   These are the goals we discussed:  Goals      Exercise 3x per week (30 min per time)     Continue to stay active and healthy. Would like to work part time.        This is a list of the screening recommended for you and due dates:  Health Maintenance  Topic Date Due   COVID-19 Vaccine (3 - Moderna risk series) 10/21/2020   Medicare Annual Wellness Visit  05/31/2023   HIV Screening  05/05/2024*   Flu Shot  06/19/2023   Pap Smear  04/03/2025   Hepatitis C Screening  Completed   HPV Vaccine  Aged Out   DTaP/Tdap/Td vaccine  Discontinued  *Topic was postponed. The date shown is not the original due date.    Advanced directives: Information on Advanced Care Planning can be found at [facility] Advance Health Care Directives Advance Health Care Directives ([email or web address] Please bring a copy of your health care power of attorney and living will to the office to be added to your chart at your convenience.  Conditions/risks identified: Aim for 30 minutes of exercise or brisk walking, 6-8 glasses of water, and 5 servings of fruits and vegetables each day.  Next appointment: Follow up in one year for your annual wellness visit.   Continue to stay active and we will be in touch with any further advice from Dr. Tanya Nones in regards to your heart rate.   Preventive Care 40-64 Years, Female Preventive care refers to lifestyle choices and visits with your health care provider that can promote health and wellness. What does preventive care include? A yearly physical exam. This is also called an annual well check. Dental exams once or twice a year. Routine eye exams. Ask your health care provider how often you should have your eyes  checked. Personal lifestyle choices, including: Daily care of your teeth and gums. Regular physical activity. Eating a healthy diet. Avoiding tobacco and drug use. Limiting alcohol use. Practicing safe sex. Taking low-dose aspirin daily starting at age 89. Taking vitamin and mineral supplements as recommended by your health care provider. What happens during an annual well check? The services and screenings done by your health care provider during your annual well check will depend on your age, overall health, lifestyle risk factors, and family history of disease. Counseling  Your health care provider may ask you questions about your: Alcohol use. Tobacco use. Drug use. Emotional well-being. Home and relationship well-being. Sexual activity. Eating habits. Work and work Astronomer. Method of birth control. Menstrual cycle. Pregnancy history. Screening  You may have the following tests or measurements: Height, weight, and BMI. Blood pressure. Lipid and cholesterol levels. These may be checked every 5 years, or more frequently if you are over 3 years old. Skin check. Lung cancer screening. You may have this screening every year starting at age 78 if you have a 30-pack-year history of smoking and currently smoke or have quit within the past 15 years. Fecal occult blood test (FOBT) of the stool. You may have this test every year starting at age 40. Flexible sigmoidoscopy or colonoscopy. You may have a sigmoidoscopy every 5 years or a colonoscopy every 10 years starting at age  50. Hepatitis C blood test. Hepatitis B blood test. Sexually transmitted disease (STD) testing. Diabetes screening. This is done by checking your blood sugar (glucose) after you have not eaten for a while (fasting). You may have this done every 1-3 years. Mammogram. This may be done every 1-2 years. Talk to your health care provider about when you should start having regular mammograms. This may depend on  whether you have a family history of breast cancer. BRCA-related cancer screening. This may be done if you have a family history of breast, ovarian, tubal, or peritoneal cancers. Pelvic exam and Pap test. This may be done every 3 years starting at age 59. Starting at age 15, this may be done every 5 years if you have a Pap test in combination with an HPV test. Bone density scan. This is done to screen for osteoporosis. You may have this scan if you are at high risk for osteoporosis. Discuss your test results, treatment options, and if necessary, the need for more tests with your health care provider. Vaccines  Your health care provider may recommend certain vaccines, such as: Influenza vaccine. This is recommended every year. Tetanus, diphtheria, and acellular pertussis (Tdap, Td) vaccine. You may need a Td booster every 10 years. Zoster vaccine. You may need this after age 83. Pneumococcal 13-valent conjugate (PCV13) vaccine. You may need this if you have certain conditions and were not previously vaccinated. Pneumococcal polysaccharide (PPSV23) vaccine. You may need one or two doses if you smoke cigarettes or if you have certain conditions. Talk to your health care provider about which screenings and vaccines you need and how often you need them. This information is not intended to replace advice given to you by your health care provider. Make sure you discuss any questions you have with your health care provider. Document Released: 12/01/2015 Document Revised: 07/24/2016 Document Reviewed: 09/05/2015 Elsevier Interactive Patient Education  2017 [employer].    Fall Prevention in the Home Falls can cause injuries. They can happen to people of all ages. There are many things you can do to make your home safe and to help prevent falls. What can I do on the outside of my home? Regularly fix the edges of walkways and driveways and fix any cracks. Remove anything that might make you trip as you  walk through a door, such as a raised step or threshold. Trim any bushes or trees on the path to your home. Use bright outdoor lighting. Clear any walking paths of anything that might make someone trip, such as rocks or tools. Regularly check to see if handrails are loose or broken. Make sure that both sides of any steps have handrails. Any raised decks and porches should have guardrails on the edges. Have any leaves, snow, or ice cleared regularly. Use sand or salt on walking paths during winter. Clean up any spills in your garage right away. This includes oil or grease spills. What can I do in the bathroom? Use night lights. Install grab bars by the toilet and in the tub and shower. Do not use towel bars as grab bars. Use non-skid mats or decals in the tub or shower. If you need to sit down in the shower, use a plastic, non-slip stool. Keep the floor dry. Clean up any water that spills on the floor as soon as it happens. Remove soap buildup in the tub or shower regularly. Attach bath mats securely with double-sided non-slip rug tape. Do not have throw rugs and other  things on the floor that can make you trip. What can I do in the bedroom? Use night lights. Make sure that you have a light by your bed that is easy to reach. Do not use any sheets or blankets that are too big for your bed. They should not hang down onto the floor. Have a firm chair that has side arms. You can use this for support while you get dressed. Do not have throw rugs and other things on the floor that can make you trip. What can I do in the kitchen? Clean up any spills right away. Avoid walking on wet floors. Keep items that you use a lot in easy-to-reach places. If you need to reach something above you, use a strong step stool that has a grab bar. Keep electrical cords out of the way. Do not use floor polish or wax that makes floors slippery. If you must use wax, use non-skid floor wax. Do not have throw rugs  and other things on the floor that can make you trip. What can I do with my stairs? Do not leave any items on the stairs. Make sure that there are handrails on both sides of the stairs and use them. Fix handrails that are broken or loose. Make sure that handrails are as long as the stairways. Check any carpeting to make sure that it is firmly attached to the stairs. Fix any carpet that is loose or worn. Avoid having throw rugs at the top or bottom of the stairs. If you do have throw rugs, attach them to the floor with carpet tape. Make sure that you have a light switch at the top of the stairs and the bottom of the stairs. If you do not have them, ask someone to add them for you. What else can I do to help prevent falls? Wear shoes that: Do not have high heels. Have rubber bottoms. Are comfortable and fit you well. Are closed at the toe. Do not wear sandals. If you use a stepladder: Make sure that it is fully opened. Do not climb a closed stepladder. Make sure that both sides of the stepladder are locked into place. Ask someone to hold it for you, if possible. Clearly mark and make sure that you can see: Any grab bars or handrails. First and last steps. Where the edge of each step is. Use tools that help you move around (mobility aids) if they are needed. These include: Canes. Walkers. Scooters. Crutches. Turn on the lights when you go into a dark area. Replace any light bulbs as soon as they burn out. Set up your furniture so you have a clear path. Avoid moving your furniture around. If any of your floors are uneven, fix them. If there are any pets around you, be aware of where they are. Review your medicines with your doctor. Some medicines can make you feel dizzy. This can increase your chance of falling. Ask your doctor what other things that you can do to help prevent falls. This information is not intended to replace advice given to you by your health care provider. Make sure  you discuss any questions you have with your health care provider. Document Released: 08/31/2009 Document Revised: 04/11/2016 Document Reviewed: 12/09/2014 Elsevier Interactive Patient Education  2017 [employer].

## 2023-06-05 NOTE — Progress Notes (Signed)
Subjective:   Theresa Washington is a 42 y.o. female who presents for Medicare Annual (Subsequent) preventive examination.  Visit Complete: In person  Review of Systems     Cardiac Risk Factors include: family history of premature cardiovascular disease     Objective:    Today's Vitals   06/05/23 1055  BP: 108/62  Weight: 129 lb (58.5 kg)  Height: 5' (1.524 m)   Body mass index is 25.19 kg/m.     06/05/2023   11:15 AM 05/14/2023   12:40 PM 05/30/2022   11:36 AM 09/04/2021   12:59 PM 08/05/2021   11:17 AM 04/02/2017   10:39 AM 01/12/2016    8:47 AM  Advanced Directives  Does Patient Have a Medical Advance Directive? No No No No No No No  Would patient like information on creating a medical advance directive? Yes (MAU/Ambulatory/Procedural Areas - Information given) No - Patient declined No - Patient declined No - Patient declined Yes (ED - Information included in AVS) No - Patient declined No - patient declined information    Current Medications (verified) Outpatient Encounter Medications as of 06/05/2023  Medication Sig   Biotin 5000 MCG CAPS Take 1 capsule by mouth daily.   Cholecalciferol (VITAMIN D-3) 125 MCG (5000 UT) TABS Take 1 tablet by mouth 1 day or 1 dose.   metoprolol succinate (TOPROL XL) 25 MG 24 hr tablet START TO INCREASE TOPROL TO 25 MG IN THE AM AND 12.5 MG IN THE PM, THEN INCREASE TO 25 MG TWICE DAILY.   Multiple Vitamin (MULTIVITAMIN WITH MINERALS) TABS tablet Take 1 tablet by mouth daily.   No facility-administered encounter medications on file as of 06/05/2023.    Allergies (verified) Shrimp [shellfish allergy], Hydromorphone, and Other   History: Past Medical History:  Diagnosis Date   Anemia    Anxiety    Back pain    Bell's palsy    no residual   Contraceptive management 11/30/2015   DDD (degenerative disc disease)    C3-C5, T12-L, L3, L4, L5, S1, S2   Fibromyalgia    History of kidney stones    passed stones   Hypertension    Lyme  arthritis (HCC)    Myositis    Ocular migraine    Palpitations    yrs ago - only one episode r/t stress   Palpitations 04/27/2014   PVC (premature ventricular contraction)    yrs ago - only one episode   Scoliosis    Seizures (HCC)    unknown etiolgy; no meds, last seizure was in 2014   Smoker 11/30/2015   quit 2015   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HCC)    Spondylosis    Past Surgical History:  Procedure Laterality Date   DILATION AND CURETTAGE OF UTERUS  11/18/1996   DILITATION & CURRETTAGE/HYSTROSCOPY WITH NOVASURE ABLATION N/A 01/16/2016   Procedure: HYSTEROSCOPY WITH NOVASURE ABLATION;  Surgeon: Tilda Burrow, MD;  Location: [facility];  Service: Gynecology;  Laterality: N/A;  Uterine Cavity Length 4.0 cm Uterine Cavity Width 3.3cm Power 73  Time 1 minute 17seconds   HIP SURGERY Left 2021   LAPAROSCOPIC BILATERAL SALPINGECTOMY Bilateral 01/16/2016   Procedure: LAPAROSCOPIC BILATERAL SALPINGECTOMY;  Surgeon: Tilda Burrow, MD;  Location: [facility];  Service: Gynecology;  Laterality: Bilateral;  procedure 1   OOPHORECTOMY  11/18/2004   Left side   SHOULDER ARTHROSCOPY Left 06/04/2021   Left shoulder arthroscopy with biceps tenodesis mini open rotator cuff tear repair   SHOULDER ARTHROSCOPY Left 09/04/2021  Procedure: LEFT SHOULDER MANIPULATION UNDER ANESTHESIA, ARTHROSCOPY WITH ROTATOR INTERVAL RELEASE;  Surgeon: Cammy Copa, MD;  Location: [facility];  Service: Orthopedics;  Laterality: Left;   Family History  Problem Relation Age of Onset   Multiple sclerosis Mother    Heart disease Father    Kidney disease Father    Alcohol abuse Brother    Mental retardation Brother    Schizophrenia Brother    Other Brother        back problems   ALS Brother    Colon cancer Maternal Grandmother    Healthy Son    Healthy Son    Social History   Socioeconomic History   Marital status: Married    Spouse name: [name]   Number of children: 2   Years of education: Not on file    Highest education level: Associate degree: occupational, technical, or vocational program  Occupational History   Not on file  Tobacco Use   Smoking status: Former    Current packs/day: 0.00    Average packs/day: 1 pack/day for 20.0 years (20.0 ttl pk-yrs)    Types: Cigarettes    Start date: 09/12/1996    Quit date: 09/12/2016    Years since quitting: 6.7    Passive exposure: Past   Smokeless tobacco: Never  Vaping Use   Vaping status: Every Day   Substances: Nicotine, CBD, Flavoring   Devices: [employer]  Substance and Sexual Activity   Alcohol use: Yes    Comment: socially   Drug use: Never   Sexual activity: Yes    Birth control/protection: Surgical    Comment: tubal ligation and ablation  Other Topics Concern   Not on file  Social History Narrative   2 sons, Alvan Dame and Hemlock.   Social Determinants of Health   Financial Resource Strain: Low Risk  (06/05/2023)   Overall Financial Resource Strain (CARDIA)    Difficulty of Paying Living Expenses: Not hard at all  Food Insecurity: No Food Insecurity (06/05/2023)   Hunger Vital Sign    Worried About Running Out of Food in the Last Year: Never true    Ran Out of Food in the Last Year: Never true  Transportation Needs: No Transportation Needs (06/05/2023)   PRAPARE - Administrator, Civil Service (Medical): No    Lack of Transportation (Non-Medical): No  Physical Activity: Sufficiently Active (06/05/2023)   Exercise Vital Sign    Days of Exercise per Week: 3 days    Minutes of Exercise per Session: 60 min  Stress: No Stress Concern Present (06/05/2023)   [employer] of Occupational Health - Occupational Stress Questionnaire    Feeling of Stress : Not at all  Social Connections: Moderately Integrated (06/05/2023)   Social Connection and Isolation Panel [NHANES]    Frequency of Communication with Friends and Family: More than three times a week    Frequency of Social Gatherings with Friends and Family: Once  a week    Attends Religious Services: More than 4 times per year    Active Member of [employer] or Organizations: No    Attends Engineer, structural: Never    Marital Status: Married    Tobacco Counseling Counseling given: Not Answered   Clinical Intake:  Pre-visit preparation completed: Yes  Pain : No/denies pain     Diabetes: No  How often do you need to have someone help you when you read instructions, pamphlets, or other written materials from your doctor or  pharmacy?: 1 - Never  Interpreter Needed?: No  Information entered by :: Kandis Fantasia LPN   Activities of Daily Living    06/05/2023   11:15 AM  In your present state of health, do you have any difficulty performing the following activities:  Hearing? 0  Vision? 0  Difficulty concentrating or making decisions? 0  Walking or climbing stairs? 0  Dressing or bathing? 0  Doing errands, shopping? 0  Preparing Food and eating ? N  Using the Toilet? N  In the past six months, have you accidently leaked urine? N  Do you have problems with loss of bowel control? N  Managing your Medications? N  Managing your Finances? N  Housekeeping or managing your Housekeeping? N    Patient Care Team: Donita Brooks, MD as PCP - General (Family Medicine) Christell Constant, MD as PCP - Cardiology (Cardiology) Pricilla Riffle, MD as Consulting Physician (Cardiology) [facility], [facility], Ginette Pitman, NP as Nurse Practitioner (Obstetrics and Gynecology)  Indicate any recent Medical Services you may have received from other than [facility] providers in the past year (date may be approximate).     Assessment:   This is a routine wellness examination for Mazzie.  Hearing/Vision screen Hearing Screening - Comments:: Denies hearing difficulties   Vision Screening - Comments:: up to date with routine eye exams with [facility]    Dietary issues and exercise activities  discussed:     Goals Addressed   None   Depression Screen    06/05/2023   11:13 AM 05/06/2023   11:59 AM 09/24/2022    8:51 AM 08/12/2022   12:06 PM 05/30/2022   11:33 AM 04/03/2022   11:28 AM 08/05/2021   11:22 AM  PHQ 2/9 Scores  PHQ - 2 Score 0 0 0 0 0 0 0  PHQ- 9 Score   0 2  2     Fall Risk    06/05/2023   11:15 AM 05/06/2023   11:59 AM 09/24/2022    8:50 AM 08/12/2022   12:07 PM 05/30/2022   11:37 AM  Fall Risk   Falls in the past year? 0 0 0 0 0  Number falls in past yr: 0 0  0 0  Injury with Fall? 0 0  0 0  Risk for fall due to : No Fall Risks No Fall Risks   No Fall Risks  Follow up Falls prevention discussed;Education provided;Falls evaluation completed Falls prevention discussed   Falls prevention discussed    MEDICARE RISK AT HOME:  Medicare Risk at Home - 06/05/23 1115     Any stairs in or around the home? Yes    If so, are there any without handrails? No    Home free of loose throw rugs in walkways, pet beds, electrical cords, etc? Yes    Adequate lighting in your home to reduce risk of falls? Yes    Life alert? No    Use of a cane, walker or w/c? No    Grab bars in the bathroom? No    Shower chair or bench in shower? No    Elevated toilet seat or a handicapped toilet? No             TIMED UP AND GO:  Was the test performed?  Yes  Length of time to ambulate 10 feet: 6 sec Gait steady and fast without use of assistive device    Cognitive Function:  06/05/2023   11:15 AM 05/30/2022   11:40 AM 08/05/2021   11:18 AM  6CIT Screen  What Year? 0 points 0 points 0 points  What month? 0 points 0 points 0 points  What time? 0 points 0 points 0 points  Count back from 20 0 points 0 points 0 points  Months in reverse 0 points 0 points 0 points  Repeat phrase 0 points 2 points 8 points  Total Score 0 points 2 points 8 points    Immunizations Immunization History  Administered Date(s) Administered   Influenza Split 08/30/2011   Influenza Whole  07/19/2012   Influenza,inj,Quad PF,6+ Mos 09/15/2015, 10/17/2017   Influenza-Unspecified 08/30/2011   Moderna Sars-Covid-2 Vaccination 08/26/2020, 09/23/2020   Tdap 03/01/2011    TDAP status: Due, Education has been provided regarding the importance of this vaccine. Advised may receive this vaccine at local pharmacy or Health Dept. Aware to provide a copy of the vaccination record if obtained from local pharmacy or Health Dept. Verbalized acceptance and understanding.  Pneumococcal vaccine status: Up to date  Covid-19 vaccine status: Information provided on how to obtain vaccines.   Qualifies for Shingles Vaccine? No    Screening Tests Health Maintenance  Topic Date Due   COVID-19 Vaccine (3 - Moderna risk series) 10/21/2020   HIV Screening  05/05/2024 (Originally [birth year]1996)   INFLUENZA VACCINE  06/19/2023   Medicare Annual Wellness (AWV)  06/04/2024   PAP SMEAR-Modifier  04/03/2025   Hepatitis C Screening  Completed   HPV VACCINES  Aged Out   DTaP/Tdap/Td  Discontinued    Health Maintenance  Health Maintenance Due  Topic Date Due   COVID-19 Vaccine (3 - Moderna risk series) 10/21/2020    Mammogram status: Completed 04/03/23. Repeat every year  Lung Cancer Screening: (Low Dose CT Chest recommended if Age 42-80 years, 20 pack-year currently smoking OR have quit w/in 15years.) does not qualify.   Lung Cancer Screening Referral: n/a  Additional Screening:  Hepatitis C Screening: does qualify; Completed 07/10/21  Vision Screening: Recommended annual ophthalmology exams for early detection of glaucoma and other disorders of the eye. Is the patient up to date with their annual eye exam?  Yes  Who is the provider or what is the name of the office in which the patient attends annual eye exams? [facility] If pt is not established with a provider, would they like to be referred to a provider to establish care? No .   Dental Screening: Recommended annual dental exams  for proper oral hygiene  Community Resource Referral / Chronic Care Management: CRR required this visit?  No   CCM required this visit?  No     Plan:     I have personally reviewed and noted the following in the patient's chart:   Medical and social history Use of alcohol, tobacco or illicit drugs  Current medications and supplements including opioid prescriptions. Patient is not currently taking opioid prescriptions. Functional ability and status Nutritional status Physical activity Advanced directives List of other physicians Hospitalizations, surgeries, and ER visits in previous 12 months Vitals Screenings to include cognitive, depression, and falls Referrals and appointments  In addition, I have reviewed and discussed with patient certain preventive protocols, quality metrics, and best practice recommendations. A written personalized care plan for preventive services as well as general preventive health recommendations were provided to patient.     Kandis Fantasia [place], [place]   02/24/8118   Nurse Notes: See telephone note

## 2023-06-30 NOTE — Progress Notes (Unsigned)
Office Visit    Patient Name: Theresa Washington Date of Encounter: 06/30/2023  Primary Care Provider:  Donita Brooks, MD Primary Cardiologist:  Christell Constant, MD Primary Electrophysiologist: None   Past Medical History    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HCC)    Myositis    Ocular migraine    Palpitations    yrs ago - only one episode r/t stress   Palpitations 04/27/2014   PVC (premature ventricular contraction)    yrs ago - only one episode   Scoliosis    Seizures (HCC)    unknown etiolgy; no meds, last seizure was in 2014   Smoker 11/30/2015   quit 2015   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HCC)    Spondylosis    Past Surgical History:  Procedure Laterality Date   DILATION AND CURETTAGE OF UTERUS  11/18/1996   DILITATION & CURRETTAGE/HYSTROSCOPY WITH NOVASURE ABLATION N/A 01/16/2016   Procedure: HYSTEROSCOPY WITH NOVASURE ABLATION;  Surgeon: Tilda Burrow, MD;  Location: [facility];  Service: Gynecology;  Laterality: N/A;  Uterine Cavity Length 4.0 cm Uterine Cavity Width 3.3cm Power 73  Time 1 minute 17seconds   HIP SURGERY Left 2021   LAPAROSCOPIC BILATERAL SALPINGECTOMY Bilateral 01/16/2016   Procedure: LAPAROSCOPIC BILATERAL SALPINGECTOMY;  Surgeon: Tilda Burrow, MD;  Location: [facility];  Service: Gynecology;  Laterality: Bilateral;  procedure 1   OOPHORECTOMY  11/18/2004   Left side   SHOULDER ARTHROSCOPY Left 06/04/2021   Left shoulder arthroscopy with biceps tenodesis mini open rotator cuff tear repair   SHOULDER ARTHROSCOPY Left 09/04/2021   Procedure: LEFT SHOULDER MANIPULATION UNDER ANESTHESIA, ARTHROSCOPY WITH ROTATOR INTERVAL RELEASE;  Surgeon: Cammy Copa, MD;   Location[facility];  Service: Orthopedics;  Laterality: Left;    Allergies  Allergies  Allergen Reactions   Shrimp [Shellfish Allergy] Anaphylaxis, Hives and Swelling    Pt states "I carry an Epipen. I had anaphylactic reaction to something but I don't know what".     Hydromorphone Nausea And Vomiting   Other     Adhesive tape caused blisters. Okay to use paper tape.     History of Present Illness    Theresa Washington  is a 42 year old female with a PMH of essential hypertension, PSVT, vasovagal syncope von Willebrand's disease, ankylosing spondylitis, fibromyalgia, anxiety, anemia who presents today for follow-up of palpitations.  Theresa Washington was seen initially for management of palpitations and chest pain by Dr. Tenny Craw.  She has past medical history of tachypalpitations and was seen previously by neurology and EP with a negative tilt table test.  She also wore Holter monitors with evidence of arrhythmia present.  She was seen by Dr. Johney Frame in 2015 and had HCTZ discontinued and NSAIDs due to induced edema.  She was seen by Dr. Lenor Derrick on 11/24/2020 and underwent POET with no evidence of ischemia showed EF of 60 to 65% with mild to moderate MR. Zio patch was ordered and showed predominantly sinus rhythm with 1 run of SVT lasting 4 beats.  She was last seen by Delphina Cahill on 05/27/2023 and  reported ongoing daily palpitations.  Toprol-XL was increased to 25 mg in the morning and 12.5 mg in the evening with plan to increase to 25 mg twice a day if BP tolerated.  Since last being seen in the office patient reports that she has been doing well with no recurrence of palpitations since previous visit.  Today is controlled at 120/80 and heart rate is 73 bpm.  She has been compliant with her current medications.  She does endorse some dizziness with standing too quickly and states that this occurs very infrequently.  We discussed side effects of beta-blockers and she is aware that standing too  quickly will cause dizziness.  She overall has been doing well with no new complaints of tachycardia since previous visit.  Patient denies chest pain, palpitations, dyspnea, PND, orthopnea, nausea, vomiting, dizziness, syncope, edema, weight gain, or early satiety. Home Medications    Current Outpatient Medications  Medication Sig Dispense Refill   Biotin 5000 MCG CAPS Take 1 capsule by mouth daily.     Cholecalciferol (VITAMIN D-3) 125 MCG (5000 UT) TABS Take 1 tablet by mouth 1 day or 1 dose.     metoprolol succinate (TOPROL XL) 25 MG 24 hr tablet START TO INCREASE TOPROL TO 25 MG IN THE AM AND 12.5 MG IN THE PM, THEN INCREASE TO 25 MG TWICE DAILY. 180 tablet 3   Multiple Vitamin (MULTIVITAMIN WITH MINERALS) TABS tablet Take 1 tablet by mouth daily.     No current facility-administered medications for this visit.     Review of Systems  Please see the history of present illness.     All other systems reviewed and are otherwise negative except as noted above.  Physical Exam    Wt Readings from Last 3 Encounters:  06/05/23 129 lb (58.5 kg)  05/27/23 129 lb (58.5 kg)  05/14/23 122 lb (55.3 kg)   [id number] were no vitals filed for this visit.,There is no height or weight on file to calculate BMI.  Constitutional:      Appearance: Healthy appearance. Not in distress.  Neck:     Vascular: JVD normal.  Pulmonary:     Effort: Pulmonary effort is normal.     Breath sounds: No wheezing. No rales. Diminished in the bases Cardiovascular:     Normal rate. Regular rhythm. Normal S1. Normal S2.      Murmurs: There is no murmur.  Edema:    Peripheral edema absent.  Abdominal:     Palpations: Abdomen is soft non tender. There is no hepatomegaly.  Skin:    General: Skin is warm and dry.  Neurological:     General: No focal deficit present.     Mental Status: Alert and oriented to person, place and time.     Cranial Nerves: Cranial nerves are intact.  EKG/LABS/ Recent Cardiac Studies     ECG personally reviewed by me today -none completed today     Lab Results  Component Value Date   WBC 5.3 05/14/2023   HGB 13.6 05/14/2023   HCT 40.2 05/14/2023   MCV 92.8 05/14/2023   PLT 321 05/14/2023   Lab Results  Component Value Date   CREATININE 0.85 05/14/2023   BUN 14 05/14/2023   NA 135 05/14/2023   K 3.5 05/14/2023   CL 101 05/14/2023   CO2 25 05/14/2023   Lab Results  Component Value Date   ALT 33 (H) 05/06/2023   AST 25 05/06/2023   ALKPHOS 80 09/01/2015   BILITOT  0.5 05/06/2023   No results found for: "CHOL", "HDL", "LDLCALC", "LDLDIRECT", "TRIG", "CHOLHDL"  Lab Results  Component Value Date   HGBA1C 5.1 04/03/2022     Assessment & Plan    1.  NSVT/palpitations: -Zio patch worn 05/15/2023 with no malignant arrhythmias noted 1 short run of PSVT lasting 4 beats. -Please continue Toprol-XL 25 mg in the a.m. and 12.5 mg in the p.m. -Patient was advised to increase p.m. dose to 25 mg if palpitations increase  2.  Presyncope: -Patient reports no recurrence of syncope since previous visit.  3.  Non rheumatic MR: -2D echo completed 12/2020 with EF of 60-65% and mild to moderate MR  4.  Essential hypertension: -Patient's blood pressure today was controlled at 120/80 -Continue beta-blocker as noted above.  Disposition: Follow-up with Christell Constant, MD as needed    Medication Adjustments/Labs and Tests Ordered: Current medicines are reviewed at length with the patient today.  Concerns regarding medicines are outlined above.   Signed, Napoleon Form, Leodis Rains, NP 06/30/2023, 7:59 AM [facility]

## 2023-07-01 ENCOUNTER — Ambulatory Visit: Payer: Medicare Other | Attending: Nurse Practitioner | Admitting: Nurse Practitioner

## 2023-07-01 ENCOUNTER — Encounter: Payer: Self-pay | Admitting: Nurse Practitioner

## 2023-07-01 VITALS — BP 120/80 | HR 73 | Ht 60.0 in | Wt 134.2 lb

## 2023-07-01 DIAGNOSIS — R55 Syncope and collapse: Secondary | ICD-10-CM

## 2023-07-01 DIAGNOSIS — I10 Essential (primary) hypertension: Secondary | ICD-10-CM

## 2023-07-01 DIAGNOSIS — R002 Palpitations: Secondary | ICD-10-CM | POA: Diagnosis not present

## 2023-07-01 DIAGNOSIS — I340 Nonrheumatic mitral (valve) insufficiency: Secondary | ICD-10-CM

## 2023-07-01 DIAGNOSIS — R42 Dizziness and giddiness: Secondary | ICD-10-CM

## 2023-07-01 MED ORDER — METOPROLOL SUCCINATE ER 25 MG PO TB24: ORAL_TABLET | ORAL | 3 refills | Status: DC

## 2023-07-01 NOTE — Patient Instructions (Signed)
Medication Instructions:  Your physician recommends that you continue on your current medications as directed. Please refer to the Current Medication list given to you today. *If you need a refill on your cardiac medications before your next appointment, please call your pharmacy*   Lab Work: NONE ORDERED If you have labs (blood work) drawn today and your tests are completely normal, you will receive your results only by: MyChart Message (if you have MyChart) OR A paper copy in the mail If you have any lab test that is abnormal or we need to change your treatment, we will call you to review the results.   Testing/Procedures: NONE ORDERED   Follow-Up: At [facility], you and your health needs are our priority.  As part of our continuing mission to provide you with exceptional heart care, we have created designated Provider Care Teams.  These Care Teams include your primary Cardiologist (physician) and Advanced Practice Providers (APPs -  Physician Assistants and Nurse Practitioners) who all work together to provide you with the care you need, when you need it.  We recommend signing up for the patient portal called "MyChart".  Sign up information is provided on this After Visit Summary.  MyChart is used to connect with patients for Virtual Visits (Telemedicine).  Patients are able to view lab/test results, encounter notes, upcoming appointments, etc.  Non-urgent messages can be sent to your provider as well.   To learn more about what you can do with MyChart, go to [email or web address].    Your next appointment:   FOLLOW UP AS NEEDED   Provider:   Christell Constant, MD     Other Instructions

## 2023-10-09 ENCOUNTER — Encounter: Payer: Self-pay | Admitting: Family Medicine

## 2023-10-28 ENCOUNTER — Ambulatory Visit: Payer: Medicare Other | Admitting: Family Medicine

## 2023-10-28 ENCOUNTER — Encounter: Payer: Self-pay | Admitting: Family Medicine

## 2023-10-28 VITALS — BP 120/84 | HR 57 | Temp 98.6°F | Ht 60.0 in | Wt 145.4 lb

## 2023-10-28 DIAGNOSIS — G500 Trigeminal neuralgia: Secondary | ICD-10-CM

## 2023-10-28 NOTE — Progress Notes (Signed)
Subjective:    Patient ID: Theresa Washington, female    DOB: October 05, 1981, 42 y.o.   MRN: [id number]  Over the last 2 months, the patient has had 2 episodes of pain on the left side of her face.  The pain is located in the distribution of the trigeminal nerve.  It starts at her ear and radiates down her left jaw, into her left cheek, and over her left eye.  She also reports hyperesthesias and extreme sensitivity to the lightest touch and even wind.  The pain lasted a day or so and then resolved spontaneously.  She denies any headaches or vision changes or balance issues.  She is pain-free in between the episodes.  She denies any hearing loss or tinnitus.  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HCC)    Myositis    Ocular migraine    Palpitations    yrs ago - only one episode r/t stress   Palpitations 04/27/2014   PVC (premature ventricular contraction)    yrs ago - only one episode   Scoliosis    Seizures (HCC)    unknown etiolgy; no meds, last seizure was in 2014   Smoker 11/30/2015   quit 2015   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HCC)    Spondylosis    Past Surgical History:  Procedure Laterality Date   DILATION AND CURETTAGE OF UTERUS  11/18/1996   DILITATION & CURRETTAGE/HYSTROSCOPY WITH NOVASURE ABLATION N/A 01/16/2016   Procedure: HYSTEROSCOPY WITH NOVASURE ABLATION;  Surgeon: Tilda Burrow, MD;  Location: [facility];  Service: Gynecology;  Laterality: N/A;  Uterine Cavity Length 4.0 cm Uterine Cavity Width 3.3cm Power 73  Time 1 minute 17seconds   HIP SURGERY Left 2021   LAPAROSCOPIC BILATERAL SALPINGECTOMY Bilateral 01/16/2016   Procedure: LAPAROSCOPIC BILATERAL SALPINGECTOMY;  Surgeon: Tilda Burrow, MD;   Location: [facility];  Service: Gynecology;  Laterality: Bilateral;  procedure 1   OOPHORECTOMY  11/18/2004   Left side   SHOULDER ARTHROSCOPY Left 06/04/2021   Left shoulder arthroscopy with biceps tenodesis mini open rotator cuff tear repair   SHOULDER ARTHROSCOPY Left 09/04/2021   Procedure: LEFT SHOULDER MANIPULATION UNDER ANESTHESIA, ARTHROSCOPY WITH ROTATOR INTERVAL RELEASE;  Surgeon: Cammy Copa, MD;  Location: [facility] OR;  Service: Orthopedics;  Laterality: Left;   Current Outpatient Medications on File Prior to Visit  Medication Sig Dispense Refill   Biotin 5000 MCG CAPS Take 1 capsule by mouth daily.     Cholecalciferol (VITAMIN D-3) 125 MCG (5000 UT) TABS Take 1 tablet by mouth 1 day or 1 dose.     metoprolol succinate (TOPROL XL) 25 MG 24 hr tablet START TO INCREASE TOPROL TO 25 MG IN THE AM AND 12.5 MG IN THE PM 135 tablet 3   Multiple Vitamin (MULTIVITAMIN WITH MINERALS) TABS tablet Take 1 tablet by mouth daily.     No current facility-administered medications on file prior to visit.   Allergies  Allergen Reactions   Shrimp [Shellfish Allergy] Anaphylaxis, Hives and Swelling    Pt states "I carry an Epipen. I had anaphylactic reaction to something but I don't know  what".     Hydromorphone Nausea And Vomiting   Other     Adhesive tape caused blisters. Okay to use paper tape.   Social History   Socioeconomic History   Marital status: Married    Spouse name: [name]   Number of children: 2   Years of education: Not on file   Highest education level: Associate degree: occupational, technical, or vocational program  Occupational History   Not on file  Tobacco Use   Smoking status: Former    Current packs/day: 0.00    Average packs/day: 1 pack/day for 20.0 years (20.0 ttl pk-yrs)    Types: Cigarettes    Start date: 09/12/1996    Quit date: 09/12/2016    Years since quitting: 7.1    Passive exposure: Past   Smokeless tobacco: Never  Vaping Use   Vaping status:  Every Day   Substances: Nicotine, CBD, Flavoring   Devices: [employer]  Substance and Sexual Activity   Alcohol use: Yes    Comment: socially   Drug use: Never   Sexual activity: Yes    Birth control/protection: Surgical    Comment: tubal ligation and ablation  Other Topics Concern   Not on file  Social History Narrative   2 sons, Alvan Dame and [place].   Social Determinants of Health   Financial Resource Strain: Low Risk  (06/05/2023)   Overall Financial Resource Strain (CARDIA)    Difficulty of Paying Living Expenses: Not hard at all  Food Insecurity: No Food Insecurity (06/05/2023)   Hunger Vital Sign    Worried About Running Out of Food in the Last Year: Never true    Ran Out of Food in the Last Year: Never true  Transportation Needs: No Transportation Needs (06/05/2023)   PRAPARE - Administrator, Civil Service (Medical): No    Lack of Transportation (Non-Medical): No  Physical Activity: Sufficiently Active (06/05/2023)   Exercise Vital Sign    Days of Exercise per Week: 3 days    Minutes of Exercise per Session: 60 min  Stress: No Stress Concern Present (06/05/2023)   [employer] of Occupational Health - Occupational Stress Questionnaire    Feeling of Stress : Not at all  Social Connections: Moderately Integrated (06/05/2023)   Social Connection and Isolation Panel [NHANES]    Frequency of Communication with Friends and Family: More than three times a week    Frequency of Social Gatherings with Friends and Family: Once a week    Attends Religious Services: More than 4 times per year    Active Member of [employer] or Organizations: No    Attends Banker Meetings: Never    Marital Status: Married  Catering manager Violence: Not At Risk (06/05/2023)   Humiliation, Afraid, Rape, and Kick questionnaire    Fear of Current or Ex-Partner: No    Emotionally Abused: No    Physically Abused: No    Sexually Abused: No       Review of Systems  All other  systems reviewed and are negative.      Objective:   Physical Exam Vitals reviewed.  Constitutional:      General: She is not in acute distress.    Appearance: She is well-developed. She is not diaphoretic.  Cardiovascular:     Rate and Rhythm: Normal rate and regular rhythm.     Heart sounds: Murmur heard.     Systolic murmur is present with a grade of 3/6.  Pulmonary:  Effort: Pulmonary effort is normal. No respiratory distress.     Breath sounds: Normal breath sounds. No wheezing or rales.  Abdominal:     General: Bowel sounds are normal. There is no distension.     Palpations: Abdomen is soft.     Tenderness: There is no abdominal tenderness. There is no guarding or rebound.  Skin:    General: Skin is warm and dry.  Neurological:     General: No focal deficit present.     Mental Status: She is oriented to person, place, and time. Mental status is at baseline.     Cranial Nerves: No cranial nerve deficit.     Sensory: No sensory deficit.     Motor: No weakness.     Coordination: Coordination normal.     Gait: Gait normal.     Deep Tendon Reflexes: Reflexes normal.         Assessment & Plan:  Trigeminal neuralgia of left side of face Symptoms sound consistent with paroxysmal trigeminal neuralgia.  As long as the symptoms are paroxysmal, I do not feel that neuroimaging is necessary.  We discussed starting Tegretol as a preventative.  At the present time the patient would like to wait and see if the pain occurs again.  Other possibilities would be atypical TMJ.

## 2023-10-30 ENCOUNTER — Telehealth: Payer: Self-pay | Admitting: Internal Medicine

## 2023-10-30 NOTE — Telephone Encounter (Signed)
Pt c/o medication issue:  1. Name of Medication: metoprolol succinate (TOPROL XL) 25 MG 24 hr tablet   2. How are you currently taking this medication (dosage and times per day)? As written  3. Are you having a reaction (difficulty breathing--STAT)? no  4. What is your medication issue? Pt said she has gained weight while on the medication. Also feels so tired. Wants to change to Bystolic. Please advise

## 2023-10-30 NOTE — Telephone Encounter (Signed)
Called pt to address concerns.   Reports feels extremely exhausted since starting metoprolol.  Was previously on Bystolic and tolerated okay stopped d/t cost.  Expresses kids are grown now and feels she can afford medication.  Also reports wt gain from 111 lbs to 145 lbs in about 3 months.   Pt denies thyroid issues but has not had labs drawn in a while.  Did have elevated TSH 3 yrs ago but that was it.   Advised pt will send message to provider to address.

## 2023-10-31 ENCOUNTER — Encounter: Payer: Self-pay | Admitting: Family Medicine

## 2023-10-31 MED ORDER — NEBIVOLOL HCL 5 MG PO TABS: 5.0000 mg | ORAL_TABLET | Freq: Every day | ORAL | 3 refills | Status: DC

## 2023-10-31 NOTE — Telephone Encounter (Signed)
Called pt advised of providers response:  Please advise patient to follow-up with PCP regarding thyroid function and we can restart Bystolic 5 mg daily and discontinue Toprol-XL 25 mg.  Please let me know if you have any further questions.  Robin Searing, NP    All questions answered.

## 2023-11-03 ENCOUNTER — Other Ambulatory Visit: Payer: Medicare Other

## 2023-11-03 ENCOUNTER — Ambulatory Visit: Payer: Medicare Other

## 2023-11-03 DIAGNOSIS — R232 Flushing: Secondary | ICD-10-CM

## 2023-11-04 LAB — FOLLICLE STIMULATING HORMONE: FSH: 57.6 m[IU]/mL

## 2023-11-04 LAB — TSH: TSH: 3.64 m[IU]/L

## 2023-11-04 LAB — LUTEINIZING HORMONE: LH: 17.5 m[IU]/mL

## 2023-11-26 ENCOUNTER — Telehealth: Payer: Self-pay | Admitting: Internal Medicine

## 2023-11-26 MED ORDER — NEBIVOLOL HCL 5 MG PO TABS: 5.0000 mg | ORAL_TABLET | Freq: Every day | ORAL | 2 refills | Status: AC

## 2023-11-26 NOTE — Telephone Encounter (Signed)
 Pt's medication was sent to pt's pharmacy as requested. Confirmation received.

## 2023-11-26 NOTE — Telephone Encounter (Signed)
*  STAT* If patient is at the pharmacy, call can be transferred to refill team.   1. Which medications need to be refilled? (please list name of each medication and dose if known)  nebivolol  (BYSTOLIC ) 5 MG tablet  2. Which pharmacy/location (including street and city if local pharmacy) is medication to be sent to? [facility] [phone] - [place], [place] - [address] AT [facility] [address]    3. Do they need a 30 day or 90 day supply?   90 day supply + refills  Patient states she just went by the pharmacy and they informed her that they never received the Rx. Patient would like to know if it can be sent again. Please advise.

## 2024-02-18 ENCOUNTER — Other Ambulatory Visit (HOSPITAL_COMMUNITY): Payer: Self-pay | Admitting: Family Medicine

## 2024-02-18 DIAGNOSIS — Z1231 Encounter for screening mammogram for malignant neoplasm of breast: Secondary | ICD-10-CM

## 2024-03-24 ENCOUNTER — Encounter (HOSPITAL_COMMUNITY): Payer: Self-pay

## 2024-03-24 ENCOUNTER — Ambulatory Visit
Admission: EM | Admit: 2024-03-24 | Discharge: 2024-03-24 | Disposition: A | Discharge status: Home / Self Care | Attending: Family Medicine | Admitting: Family Medicine

## 2024-03-24 DIAGNOSIS — M5442 Lumbago with sciatica, left side: Secondary | ICD-10-CM | POA: Diagnosis not present

## 2024-03-24 MED ORDER — PREDNISONE 10 MG PO TABS: ORAL_TABLET | ORAL | 0 refills | Status: AC

## 2024-03-24 MED ORDER — IBUPROFEN 800 MG PO TABS: 800.0000 mg | ORAL_TABLET | Freq: Three times a day (TID) | ORAL | 0 refills | Status: AC

## 2024-03-24 MED ORDER — CYCLOBENZAPRINE HCL 10 MG PO TABS: 10.0000 mg | ORAL_TABLET | Freq: Three times a day (TID) | ORAL | 0 refills | Status: AC | PRN

## 2024-03-24 MED ORDER — TRIAMCINOLONE ACETONIDE 40 MG/ML IJ SUSP: 60.0000 mg | Freq: Once | INTRAMUSCULAR | Status: DC

## 2024-03-24 MED ORDER — KETOROLAC TROMETHAMINE 30 MG/ML IJ SOLN
60.0000 mg | Freq: Once | INTRAMUSCULAR | Status: AC
  Administered 2024-03-24: 60 mg via INTRAMUSCULAR

## 2024-03-24 MED ORDER — TRIAMCINOLONE ACETONIDE 40 MG/ML IJ SUSP
40.0000 mg | Freq: Once | INTRAMUSCULAR | Status: AC
  Administered 2024-03-24: 40 mg via INTRAMUSCULAR

## 2024-03-24 NOTE — Discharge Instructions (Addendum)
 You have been given injections of an anti-inflammatory and steroid here in the clinic. Prescriptions for ibuprofen, prednisone , and a muscle relaxer have been sent to the pharmacy on file. May continue to use thermal therapy (ice/warmth) for symptom management.

## 2024-03-24 NOTE — ED Provider Notes (Signed)
 [facility]    CSN: [id number] Arrival date & time: 03/24/24  1000      History   Chief Complaint No chief complaint on file.   HPI Theresa YANKE is a 43 y.o. female.   Patient presents for evaluation of low back pain that started this past Sunday evening.  Patient states she went to bend over to pick something up and that is when the onset of the discomfort started.  It is located in her left low back and radiates into her gluteus area and left leg.  States it's a 'catching' sensation.  Patient has a history of the same-happened approximately 6 to 8 months ago.  Denies any saddle anesthesias, loss of bowel or bladder, or unilateral lower extremity weakness.  She has been utilizing ice, Epsom salt soaks, and a BC arthritis powder without any improvement in the symptoms.  No other symptoms reported such as fever, chest pain, shortness of breath, abdominal pain, vomiting, diarrhea, or dysuria.  The history is provided by the patient.    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HCC)    Myositis    Ocular migraine    Palpitations    yrs ago - only one episode r/t stress   Palpitations 04/27/2014   PVC (premature ventricular contraction)    yrs ago - only one episode   Scoliosis    Seizures (HCC)    unknown etiolgy; no meds, last seizure was in 2014   Smoker 11/30/2015   quit 2015   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HCC)    Spondylosis     Patient Active Problem List   Diagnosis Date Noted   Livedo reticularis 01/08/2022   Trochanteric bursitis, left hip 08/31/2021   Bilateral knee pain 08/01/2021   Rheumatoid arthritis with rheumatoid factor of multiple sites without organ or systems involvement  (HCC) 07/10/2021   High risk medication use 07/10/2021   Essential hypertension 11/24/2020   Nonrheumatic mitral valve regurgitation 11/24/2020   Smoker 11/30/2015   Palpitations 04/27/2014   Spondylosis 02/24/2014   Fibromyalgia 02/10/2013   Somatization disorder 02/10/2013   Psychogenic nonepileptic seizure 02/10/2013   DDD (degenerative disc disease), lumbosacral 02/10/2013   History of idiopathic seizure 10/14/2011   Von Willebrand's disease (HCC) 10/14/2011   COPD (chronic obstructive pulmonary disease) (HCC) 05/20/2010    Past Surgical History:  Procedure Laterality Date   DILATION AND CURETTAGE OF UTERUS  11/18/1996   DILITATION & CURRETTAGE/HYSTROSCOPY WITH NOVASURE ABLATION N/A 01/16/2016   Procedure: HYSTEROSCOPY WITH NOVASURE ABLATION;  Surgeon: Albino Hum, MD;  Location: [facility];  Service: Gynecology;  Laterality: N/A;  Uterine Cavity Length 4.0 cm Uterine Cavity Width 3.3cm Power 73  Time 1 minute 17seconds   HIP SURGERY Left 2021   LAPAROSCOPIC BILATERAL SALPINGECTOMY Bilateral 01/16/2016   Procedure: LAPAROSCOPIC BILATERAL SALPINGECTOMY;  Surgeon: Albino Hum, MD;  Location: [facility];  Service: Gynecology;  Laterality: Bilateral;  procedure 1   OOPHORECTOMY  11/18/2004   Left side   SHOULDER ARTHROSCOPY Left 06/04/2021   Left shoulder arthroscopy with biceps tenodesis mini open rotator cuff tear repair  SHOULDER ARTHROSCOPY Left 09/04/2021   Procedure: LEFT SHOULDER MANIPULATION UNDER ANESTHESIA, ARTHROSCOPY WITH ROTATOR INTERVAL RELEASE;  Surgeon: Jasmine Mesi, MD;  Location: [facility];  Service: Orthopedics;  Laterality: Left;    OB History     Gravida  2   Para  2   Term      Preterm      AB      Living  2      SAB      IAB      Ectopic      Multiple      Live Births               Home Medications    Prior to Admission medications   Medication Sig Start Date End Date Taking? Authorizing Provider  cyclobenzaprine   (FLEXERIL ) 10 MG tablet Take 1 tablet (10 mg total) by mouth every 8 (eight) hours as needed for muscle spasms. 03/24/24  Yes Genene Kennel, FNP  ibuprofen (ADVIL) 800 MG tablet Take 1 tablet (800 mg total) by mouth every 8 (eight) hours for 3 days. (With meals).  Then every 8 hours following. 03/24/24 03/27/24 Yes Genene Kennel, FNP  predniSONE  (DELTASONE ) 10 MG tablet Take 6 tablets (60 mg total) by mouth daily with breakfast for 1 day, THEN 5 tablets (50 mg total) daily with breakfast for 1 day, THEN 4 tablets (40 mg total) daily with breakfast for 1 day, THEN 3 tablets (30 mg total) daily with breakfast for 1 day, THEN 2 tablets (20 mg total) daily with breakfast for 1 day, THEN 1 tablet (10 mg total) daily with breakfast for 1 day. 03/24/24 03/30/24 Yes Genene Kennel, FNP  Biotin 5000 MCG CAPS Take 1 capsule by mouth daily.    [provider]  Cholecalciferol (VITAMIN D-3) 125 MCG (5000 UT) TABS Take 1 tablet by mouth 1 day or 1 dose.    [provider]  Multiple Vitamin (MULTIVITAMIN WITH MINERALS) TABS tablet Take 1 tablet by mouth daily.    [provider]  nebivolol  (BYSTOLIC ) 5 MG tablet Take 1 tablet (5 mg total) by mouth daily. 11/26/23   Gerald Kitty., NP    Family History Family History  Problem Relation Age of Onset   Multiple sclerosis Mother    Heart disease Father    Kidney disease Father    Alcohol abuse Brother    Mental retardation Brother    Schizophrenia Brother    Other Brother        back problems   ALS Brother    Colon cancer Maternal Grandmother    Healthy Son    Healthy Son     Social History Social History   Tobacco Use   Smoking status: Former    Current packs/day: 0.00    Average packs/day: 1 pack/day for 20.0 years (20.0 ttl pk-yrs)    Types: Cigarettes    Start date: 09/12/1996    Quit date: 09/12/2016    Years since quitting: 7.5    Passive exposure: Past   Smokeless tobacco: Never  Vaping Use   Vaping status: Every Day    Substances: Nicotine, CBD, Flavoring   Devices: [employer]  Substance Use Topics   Alcohol use: Yes    Comment: socially   Drug use: Never     Allergies   Shrimp [shellfish allergy], Hydromorphone , and Other   Review of Systems Review of Systems  Constitutional:  Negative for chills, fatigue and fever.  HENT:  Negative for congestion, rhinorrhea and sore throat.   Eyes:  Negative for pain and redness.  Respiratory:  Negative for cough and shortness of breath.   Cardiovascular:  Negative for chest pain and palpitations.  Gastrointestinal:  Negative for abdominal pain, diarrhea and vomiting.  Genitourinary:  Negative for dysuria and urgency.  Musculoskeletal:  Positive for back pain and myalgias. Negative for arthralgias.  Skin:  Negative for rash.  Neurological:  Negative for dizziness and headaches.     Physical Exam Triage Vital Signs ED Triage Vitals  Encounter Vitals Group     BP 03/24/24 1008 127/85     Systolic BP Percentile --      Diastolic BP Percentile --      Pulse Rate 03/24/24 1008 80     Resp 03/24/24 1008 18     Temp 03/24/24 1008 98.6 F (37 C)     Temp Source 03/24/24 1008 Oral     SpO2 03/24/24 1008 99 %     Weight --      Height --      Head Circumference --      Peak Flow --      Pain Score 03/24/24 1012 8     Pain Loc --      Pain Education --      Exclude from Growth Chart --    No data found.  Updated Vital Signs BP 127/85 (BP Location: Right Arm)   Pulse 80   Temp 98.6 F (37 C) (Oral)   Resp 18   SpO2 99%   Visual Acuity Right Eye Distance:   Left Eye Distance:   Bilateral Distance:    Right Eye Near:   Left Eye Near:    Bilateral Near:     Physical Exam Vitals and nursing note reviewed.  Constitutional:      Appearance: Normal appearance.  HENT:     Head: Normocephalic.  Cardiovascular:     Rate and Rhythm: Normal rate and regular rhythm.     Heart sounds: Normal heart sounds.  Pulmonary:     Effort:  Pulmonary effort is normal.     Breath sounds: Normal breath sounds.  Abdominal:     General: Bowel sounds are normal.  Musculoskeletal:     Cervical back: Normal range of motion.     Thoracic back: No tenderness or bony tenderness.     Lumbar back: Tenderness present. No bony tenderness.     Comments: Diffuse left lower lumbar paraspinal muscle tenderness with radiation into the left gluteal muscle.  Unable to flex due to discomfort, is able to hyperextend and rotate her torso without much difficulty.  Equal lower extremity strength, patella reflexes 2+ bilaterally.  No external skin changes noted.  Skin:    General: Skin is warm and dry.  Neurological:     General: No focal deficit present.     Mental Status: She is alert and oriented to person, place, and time.  Psychiatric:        Mood and Affect: Mood normal.        Behavior: Behavior normal.        Thought Content: Thought content normal.        Judgment: Judgment normal.      UC Treatments / Results  Labs (all labs ordered are listed, but only abnormal results are displayed) Labs Reviewed - No data to display  EKG   Radiology No results found.  Procedures Procedures (including critical care time)  Medications Ordered in UC Medications  ketorolac  (TORADOL ) 30 MG/ML injection 60 mg (60 mg Intramuscular Given 03/24/24 1104)  triamcinolone  acetonide (KENALOG-40) injection 40 mg (40 mg Intramuscular Given 03/24/24 1104)    Initial Impression / Assessment and Plan / UC Course  I have reviewed the triage vital signs and the nursing notes.  Pertinent labs & imaging results that were available during my care of the patient were reviewed by me and considered in my medical decision making (see chart for details).    Patient presents for evaluation of left low back pain after bending over to pick something up four days ago.  Her symptoms are congruent with low back pain and left-sided sciatica.  Treated with an injection  ketorolac  here in clinic along with a dose of triamcinolone  (dosing suboptimal due to clinic availability of medication).  Will augment steroidal injection with a short oral steroid taper and scheduled interval dosing of ibuprofen.  I have also prescribed a muscle relaxer.  Reviewed thermal therapy and red flags which would warrant emergency department valuation.  She has scheduled follow-up already for May 14 with her primary care provider. Final Clinical Impressions(s) / UC Diagnoses   Final diagnoses:  Acute left-sided low back pain with left-sided sciatica     Discharge Instructions      You have been given injections of an anti-inflammatory and steroid here in the clinic. Prescriptions for ibuprofen, prednisone , and a muscle relaxer have been sent to the pharmacy on file. May continue to use thermal therapy (ice/warmth) for symptom management.     ED Prescriptions     Medication Sig Dispense Auth. Provider   ibuprofen (ADVIL) 800 MG tablet Take 1 tablet (800 mg total) by mouth every 8 (eight) hours for 3 days. (With meals).  Then every 8 hours following. 30 tablet Genene Kennel, FNP   cyclobenzaprine  (FLEXERIL ) 10 MG tablet Take 1 tablet (10 mg total) by mouth every 8 (eight) hours as needed for muscle spasms. 20 tablet Genene Kennel, FNP   predniSONE  (DELTASONE ) 10 MG tablet Take 6 tablets (60 mg total) by mouth daily with breakfast for 1 day, THEN 5 tablets (50 mg total) daily with breakfast for 1 day, THEN 4 tablets (40 mg total) daily with breakfast for 1 day, THEN 3 tablets (30 mg total) daily with breakfast for 1 day, THEN 2 tablets (20 mg total) daily with breakfast for 1 day, THEN 1 tablet (10 mg total) daily with breakfast for 1 day. 21 tablet Genene Kennel, FNP      PDMP not reviewed this encounter.   Genene Kennel, FNP 03/24/24 [phone]

## 2024-03-24 NOTE — ED Notes (Signed)
 Pt received 3 injections 1. Kenalog 40 mg  in the left glute, Toradol  30 in the right glute and Toradol  30 in the right thigh. Pt tolerated injections well.

## 2024-03-24 NOTE — ED Triage Notes (Signed)
 Pt reports lower back pain x 2 days after picking up something has noticed spasms since that time. Difficulty sitting down and standing without pain.

## 2024-03-29 ENCOUNTER — Encounter (HOSPITAL_COMMUNITY): Payer: Self-pay

## 2024-03-31 ENCOUNTER — Ambulatory Visit: Admitting: Family Medicine

## 2024-04-05 ENCOUNTER — Ambulatory Visit (HOSPITAL_COMMUNITY)
Admission: RE | Admit: 2024-04-05 | Discharge: 2024-04-05 | Disposition: A | Discharge status: Home / Self Care | Source: Ambulatory Visit | Attending: Family Medicine | Admitting: Family Medicine

## 2024-04-05 DIAGNOSIS — Z1231 Encounter for screening mammogram for malignant neoplasm of breast: Secondary | ICD-10-CM | POA: Insufficient documentation

## 2024-06-10 ENCOUNTER — Ambulatory Visit: Payer: Medicare Other | Admitting: *Deleted

## 2024-06-10 VITALS — Ht 62.0 in | Wt 138.0 lb

## 2024-06-10 DIAGNOSIS — Z0000 Encounter for general adult medical examination without abnormal findings: Secondary | ICD-10-CM | POA: Diagnosis not present

## 2024-06-10 NOTE — Patient Instructions (Addendum)
 Theresa Washington , Thank you for taking time to come for your Medicare Wellness Visit. I appreciate your ongoing commitment to your health goals. Please review the following plan we discussed and let me know if I can assist you in the future.   Screening recommendations/referrals:  Mammogram: up to date Recommended yearly ophthalmology/optometry visit for glaucoma screening and checkup Recommended yearly dental visit for hygiene and checkup  Vaccinations: Influenza vaccine:  Pneumococcal vaccine:  Tdap vaccine:       Preventive Care  Female Preventive care refers to lifestyle choices and visits with your health care provider that can promote health and wellness. What does preventive care include? A yearly physical exam. This is also called an annual well check. Dental exams once or twice a year. Routine eye exams. Ask your health care provider how often you should have your eyes checked. Personal lifestyle choices, including: Daily care of your teeth and gums. Regular physical activity. Eating a healthy diet. Avoiding tobacco and drug use. Limiting alcohol use. Practicing safe sex. Taking low-dose aspirin every day. Taking vitamin and mineral supplements as recommended by your health care provider. What happens during an annual well check? The services and screenings done by your health care provider during your annual well check will depend on your age, overall health, lifestyle risk factors, and family history of disease. Counseling  Your health care provider may ask you questions about your: Alcohol use. Tobacco use. Drug use. Emotional well-being. Home and relationship well-being. Sexual activity. Eating habits. History of falls. Memory and ability to understand (cognition). Work and work Astronomer. Reproductive health. Screening  You may have the following tests or measurements: Height, weight, and BMI. Blood pressure. Lipid and cholesterol levels. These may be  checked every 5 years, or more frequently if you are over 40 years old. Skin check. Lung cancer screening. You may have this screening every year starting at age 22 if you have a 30-pack-year history of smoking and currently smoke or have quit within the past 15 years. Fecal occult blood test (FOBT) of the stool. You may have this test every year starting at age 78. Flexible sigmoidoscopy or colonoscopy. You may have a sigmoidoscopy every 5 years or a colonoscopy every 10 years starting at age 74. Hepatitis C blood test. Hepatitis B blood test. Sexually transmitted disease (STD) testing. Diabetes screening. This is done by checking your blood sugar (glucose) after you have not eaten for a while (fasting). You may have this done every 1-3 years. Bone density scan. This is done to screen for osteoporosis. You may have this done starting at age 30. Mammogram. This may be done every 1-2 years. Talk to your health care provider about how often you should have regular mammograms. Talk with your health care provider about your test results, treatment options, and if necessary, the need for more tests. Vaccines  Your health care provider may recommend certain vaccines, such as: Influenza vaccine. This is recommended every year. Tetanus, diphtheria, and acellular pertussis (Tdap, Td) vaccine. You may need a Td booster every 10 years. Zoster vaccine. You may need this after age 65. Pneumococcal 13-valent conjugate (PCV13) vaccine. One dose is recommended after age 1. Pneumococcal polysaccharide (PPSV23) vaccine. One dose is recommended after age 41. Talk to your health care provider about which screenings and vaccines you need and how often you need them. This information is not intended to replace advice given to you by your health care provider. Make sure you discuss any questions you  have with your health care provider. Document Released: 12/01/2015 Document Revised: 07/24/2016 Document Reviewed:  09/05/2015 Elsevier Interactive Patient Education  2017 [employer].  Fall Prevention in the Home Falls can cause injuries. They can happen to people of all ages. There are many things you can do to make your home safe and to help prevent falls. What can I do on the outside of my home? Regularly fix the edges of walkways and driveways and fix any cracks. Remove anything that might make you trip as you walk through a door, such as a raised step or threshold. Trim any bushes or trees on the path to your home. Use bright outdoor lighting. Clear any walking paths of anything that might make someone trip, such as rocks or tools. Regularly check to see if handrails are loose or broken. Make sure that both sides of any steps have handrails. Any raised decks and porches should have guardrails on the edges. Have any leaves, snow, or ice cleared regularly. Use sand or salt on walking paths during winter. Clean up any spills in your garage right away. This includes oil or grease spills. What can I do in the bathroom? Use night lights. Install grab bars by the toilet and in the tub and shower. Do not use towel bars as grab bars. Use non-skid mats or decals in the tub or shower. If you need to sit down in the shower, use a plastic, non-slip stool. Keep the floor dry. Clean up any water that spills on the floor as soon as it happens. Remove soap buildup in the tub or shower regularly. Attach bath mats securely with double-sided non-slip rug tape. Do not have throw rugs and other things on the floor that can make you trip. What can I do in the bedroom? Use night lights. Make sure that you have a light by your bed that is easy to reach. Do not use any sheets or blankets that are too big for your bed. They should not hang down onto the floor. Have a firm chair that has side arms. You can use this for support while you get dressed. Do not have throw rugs and other things on the floor that can make you  trip. What can I do in the kitchen? Clean up any spills right away. Avoid walking on wet floors. Keep items that you use a lot in easy-to-reach places. If you need to reach something above you, use a strong step stool that has a grab bar. Keep electrical cords out of the way. Do not use floor polish or wax that makes floors slippery. If you must use wax, use non-skid floor wax. Do not have throw rugs and other things on the floor that can make you trip. What can I do with my stairs? Do not leave any items on the stairs. Make sure that there are handrails on both sides of the stairs and use them. Fix handrails that are broken or loose. Make sure that handrails are as long as the stairways. Check any carpeting to make sure that it is firmly attached to the stairs. Fix any carpet that is loose or worn. Avoid having throw rugs at the top or bottom of the stairs. If you do have throw rugs, attach them to the floor with carpet tape. Make sure that you have a light switch at the top of the stairs and the bottom of the stairs. If you do not have them, ask someone to add them for you.  What else can I do to help prevent falls? Wear shoes that: Do not have high heels. Have rubber bottoms. Are comfortable and fit you well. Are closed at the toe. Do not wear sandals. If you use a stepladder: Make sure that it is fully opened. Do not climb a closed stepladder. Make sure that both sides of the stepladder are locked into place. Ask someone to hold it for you, if possible. Clearly mark and make sure that you can see: Any grab bars or handrails. First and last steps. Where the edge of each step is. Use tools that help you move around (mobility aids) if they are needed. These include: Canes. Walkers. Scooters. Crutches. Turn on the lights when you go into a dark area. Replace any light bulbs as soon as they burn out. Set up your furniture so you have a clear path. Avoid moving your furniture  around. If any of your floors are uneven, fix them. If there are any pets around you, be aware of where they are. Review your medicines with your doctor. Some medicines can make you feel dizzy. This can increase your chance of falling. Ask your doctor what other things that you can do to help prevent falls. This information is not intended to replace advice given to you by your health care provider. Make sure you discuss any questions you have with your health care provider. Document Released: 08/31/2009 Document Revised: 04/11/2016 Document Reviewed: 12/09/2014 Elsevier Interactive Patient Education  2017 [employer].

## 2024-06-10 NOTE — Progress Notes (Signed)
 Subjective:   Theresa Washington is a 43 y.o. female who presents for Medicare Annual (Subsequent) preventive examination.  Visit Complete: Virtual I connected with  Theresa Washington on 06/10/24 by a audio enabled telemedicine application and verified that I am speaking with the correct person using two identifiers.  Patient Location: Home  Provider Location: Home Office  I discussed the limitations of evaluation and management by telemedicine. The patient expressed understanding and agreed to proceed.  Vital Signs: Because this visit was a virtual/telehealth visit, some criteria may be missing or patient reported. Any vitals not documented were not able to be obtained and vitals that have been documented are patient reported.   Cardiac Risk Factors include: advanced age (>58men, >39 women);obesity (BMI >30kg/m2);hypertension     Objective:    Today's Vitals   06/10/24 1014  Weight: 138 lb (62.6 kg)  Height: 5' 2 (1.575 m)  PainSc: 9    Body mass index is 25.24 kg/m.     06/10/2024   10:23 AM 06/05/2023   11:15 AM 05/14/2023   12:40 PM 05/30/2022   11:36 AM 09/04/2021   12:59 PM 08/05/2021   11:17 AM 04/02/2017   10:39 AM  Advanced Directives  Does Patient Have a Medical Advance Directive? No No No No No No No   Would patient like information on creating a medical advance directive? No - Patient declined Yes (MAU/Ambulatory/Procedural Areas - Information given) No - Patient declined No - Patient declined No - Patient declined Yes (ED - Information included in AVS) No - Patient declined      Data saved with a previous flowsheet row definition    Current Medications (verified) Outpatient Encounter Medications as of 06/10/2024  Medication Sig   Biotin 5000 MCG CAPS Take 1 capsule by mouth daily.   Cholecalciferol (VITAMIN D-3) 125 MCG (5000 UT) TABS Take 1 tablet by mouth 1 day or 1 dose.   cyclobenzaprine  (FLEXERIL ) 10 MG tablet Take 1 tablet (10 mg total) by mouth  every 8 (eight) hours as needed for muscle spasms.   Multiple Vitamin (MULTIVITAMIN WITH MINERALS) TABS tablet Take 1 tablet by mouth daily.   nebivolol  (BYSTOLIC ) 5 MG tablet Take 1 tablet (5 mg total) by mouth daily.   No facility-administered encounter medications on file as of 06/10/2024.    Allergies (verified) Shrimp [shellfish allergy], Hydromorphone , and Other   History: 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HCC)    Myositis    Ocular migraine    Palpitations    yrs ago - only one episode r/t stress   Palpitations 04/27/2014   PVC (premature ventricular contraction)    yrs ago - only one episode   Scoliosis    Seizures (HCC)    unknown etiolgy; no meds, last seizure was in 2014   Smoker 11/30/2015   quit 2015   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HCC)    Spondylosis    Past Surgical History:  Procedure Laterality Date   DILATION AND CURETTAGE OF UTERUS  11/18/1996   DILITATION & CURRETTAGE/HYSTROSCOPY WITH NOVASURE ABLATION N/A 01/16/2016   Procedure: HYSTEROSCOPY WITH NOVASURE  ABLATION;  Surgeon: Theresa Edsel GAILS, MD;  Location: [facility];  Service: Gynecology;  Laterality: N/A;  Uterine Cavity Length 4.0 cm Uterine Cavity Width 3.3cm Power 73  Time 1 minute 17seconds   HIP SURGERY Left 2021   LAPAROSCOPIC BILATERAL SALPINGECTOMY Bilateral 01/16/2016   Procedure: LAPAROSCOPIC BILATERAL SALPINGECTOMY;  Surgeon: Theresa Edsel GAILS, MD;  Location: [facility];  Service: Gynecology;  Laterality: Bilateral;  procedure 1   OOPHORECTOMY  11/18/2004   Left side   SHOULDER ARTHROSCOPY Left 06/04/2021   Left shoulder arthroscopy with biceps tenodesis mini open rotator cuff tear repair   SHOULDER ARTHROSCOPY Left 09/04/2021    Procedure: LEFT SHOULDER MANIPULATION UNDER ANESTHESIA, ARTHROSCOPY WITH ROTATOR INTERVAL RELEASE;  Surgeon: Addie Cordella Hamilton, MD;  Location: [facility] OR;  Service: Orthopedics;  Laterality: Left;   Family History  Problem Relation Age of Onset   Multiple sclerosis Mother    Heart disease Father    Kidney disease Father    Alcohol abuse Brother    Mental retardation Brother    Schizophrenia Brother    Other Brother        back problems   ALS Brother    Colon cancer Maternal Grandmother    Healthy Son    Healthy Son    Social History   Socioeconomic History   Marital status: Married    Spouse name: Theresa Sr.   Number of children: 2   Years of education: Not on file   Highest education level: Associate degree: occupational, technical, or vocational program  Occupational History   Not on file  Tobacco Use   Smoking status: Former    Current packs/day: 0.00    Average packs/day: 1 pack/day for 20.0 years (20.0 ttl pk-yrs)    Types: Cigarettes    Start date: 09/12/1996    Quit date: 09/12/2016    Years since quitting: 7.7    Passive exposure: Past   Smokeless tobacco: Never  Vaping Use   Vaping status: Every Day   Substances: Nicotine, CBD, Flavoring   Devices: [employer]  Substance and Sexual Activity   Alcohol use: Yes    Comment: socially   Drug use: Never   Sexual activity: Yes    Birth control/protection: Surgical    Comment: tubal ligation and ablation  Other Topics Concern   Not on file  Social History Narrative   2 sons, Theresa Washington and Theresa Washington.   Social Drivers of Corporate investment banker Strain: Low Risk  (06/10/2024)   Overall Financial Resource Strain (CARDIA)    Difficulty of Paying Living Expenses: Not hard at all  Food Insecurity: No Food Insecurity (06/10/2024)   Hunger Vital Sign    Worried About Running Out of Food in the Last Year: Never true    Ran Out of Food in the Last Year: Never true  Transportation Needs: No Transportation Needs (06/10/2024)    PRAPARE - Administrator, Civil Service (Medical): No    Lack of Transportation (Non-Medical): No  Physical Activity: Sufficiently Active (06/10/2024)   Exercise Vital Sign    Days of Exercise per Week: 4 days    Minutes of Exercise per Session: 50 min  Stress: No Stress Concern Present (06/10/2024)   [employer] of Occupational Health - Occupational Stress Questionnaire    Feeling of Stress: Only a little  Social Connections: Socially Integrated (06/10/2024)   Social Connection and Isolation Panel    Frequency of Communication with Friends and Family: More than three  times a week    Frequency of Social Gatherings with Friends and Family: More than three times a week    Attends Religious Services: More than 4 times per year    Active Member of [employer] or Organizations: Yes    Attends Engineer, structural: More than 4 times per year    Marital Status: Married    Tobacco Counseling Counseling given: Not Answered   Clinical Intake:  Pre-visit preparation completed: Yes  Pain : 0-10 Pain Score: 9  Pain Type: Chronic pain Pain Location: Shoulder Pain Orientation: Right Pain Descriptors / Indicators: Aching, Burning, Dull, Constant Pain Onset: More than a month ago Pain Frequency: Constant     Diabetes: No  How often do you need to have someone help you when you read instructions, pamphlets, or other written materials from your doctor or pharmacy?: 1 - Never  Interpreter Needed?: No  Information entered by :: Mliss Graff LPN   Activities of Daily Living    06/10/2024   10:22 AM  In your present state of health, do you have any difficulty performing the following activities:  Hearing? 0  Vision? 0  Difficulty concentrating or making decisions? 0  Walking or climbing stairs? 0  Dressing or bathing? 0  Doing errands, shopping? 0  Preparing Food and eating ? N  Using the Toilet? N  In the past six months, have you accidently leaked urine? N   Do you have problems with loss of bowel control? N  Managing your Medications? N  Managing your Finances? N  Housekeeping or managing your Housekeeping? N    Patient Care Team: Duanne Butler DASEN, MD as PCP - General (Family Medicine) Santo Stanly LABOR, MD as PCP - Cardiology (Cardiology) Okey Vina GAILS, MD as Consulting Physician (Cardiology) [facility], [facility]  Signa Delon LABOR, NP as Nurse Practitioner (Obstetrics and Gynecology)  Indicate any recent Medical Services you may have received from other than [facility] providers in the past year (date may be approximate).     Assessment:   This is a routine wellness examination for Verenis.  Hearing/Vision screen Hearing Screening - Comments:: No trouble hearing Vision Screening - Comments:: [facility] Up to date   Goals Addressed             This Visit's Progress    Exercise 3x per week (30 min per time)   On track    Continue to stay active and healthy. Would like to work part time.     Increase physical activity       Better health        continue with exercise        Depression Screen    06/10/2024   10:27 AM 10/28/2023   11:54 AM 06/05/2023   11:13 AM 05/06/2023   11:59 AM 09/24/2022    8:51 AM 08/12/2022   12:06 PM 05/30/2022   11:33 AM  PHQ 2/9 Scores  PHQ - 2 Score 0 0 0 0 0 0 0  PHQ- 9 Score 2 2   0 2     Fall Risk    06/10/2024   10:18 AM 10/28/2023   11:54 AM 06/05/2023   11:15 AM 05/06/2023   11:59 AM 09/24/2022    8:50 AM  Fall Risk   Falls in the past year? 0 0 0 0 0  Number falls in past yr: 0 0 0 0   Injury with Fall? 0 0 0 0   Risk for  fall due to :   No Fall Risks No Fall Risks   Follow up Falls evaluation completed;Education provided;Falls prevention discussed  Falls prevention discussed;Education provided;Falls evaluation completed Falls prevention discussed     MEDICARE RISK AT HOME: Medicare Risk at Home Any stairs in or around the home?: No If so, are there  any without handrails?: No Home free of loose throw rugs in walkways, pet beds, electrical cords, etc?: Yes Adequate lighting in your home to reduce risk of falls?: Yes Life alert?: No Grab bars in the bathroom?: No Shower chair or bench in shower?: No Elevated toilet seat or a handicapped toilet?: Yes  TIMED UP AND GO:  Was the test performed?  No    Cognitive Function:        06/10/2024   10:23 AM 06/05/2023   11:15 AM 05/30/2022   11:40 AM 08/05/2021   11:18 AM  6CIT Screen  What Year? 0 points 0 points 0 points 0 points  What month? 0 points 0 points 0 points 0 points  What time? 0 points 0 points 0 points 0 points  Count back from 20 2 points 0 points 0 points 0 points  Months in reverse 0 points 0 points 0 points 0 points  Repeat phrase 2 points 0 points 2 points 8 points  Total Score 4 points 0 points 2 points 8 points    Immunizations Immunization History  Administered Date(s) Administered   Influenza Split 08/30/2011   Influenza Whole 07/19/2012   Influenza,inj,Quad PF,6+ Mos 09/15/2015, 10/17/2017   Influenza-Unspecified 08/30/2011   Moderna Sars-Covid-2 Vaccination 08/26/2020, 09/23/2020   Tdap 03/01/2011    TDAP status: Due, Education has been provided regarding the importance of this vaccine. Advised may receive this vaccine at local pharmacy or Health Dept. Aware to provide a copy of the vaccination record if obtained from local pharmacy or Health Dept. Verbalized acceptance and understanding.  Flu Vaccine status: Due, Education has been provided regarding the importance of this vaccine. Advised may receive this vaccine at local pharmacy or Health Dept. Aware to provide a copy of the vaccination record if obtained from local pharmacy or Health Dept. Verbalized acceptance and understanding.  Pneumococcal vaccine status: Due, Education has been provided regarding the importance of this vaccine. Advised may receive this vaccine at local pharmacy or Health Dept.  Aware to provide a copy of the vaccination record if obtained from local pharmacy or Health Dept. Verbalized acceptance and understanding.  Covid-19 vaccine status: Declined, Education has been provided regarding the importance of this vaccine but patient still declined. Advised may receive this vaccine at local pharmacy or Health Dept.or vaccine clinic. Aware to provide a copy of the vaccination record if obtained from local pharmacy or Health Dept. Verbalized acceptance and understanding.  Qualifies for Shingles Vaccine? No   Zostavax completed No     Screening Tests Health Maintenance  Topic Date Due   HIV Screening  Never done   Pneumococcal Vaccine 3-42 Years old (1 of 2 - PCV) Never done   Hepatitis B Vaccines (1 of 3 - 19+ 3-dose series) Never done   HPV VACCINES (1 - 3-dose SCDM series) Never done   INFLUENZA VACCINE  06/18/2024   Medicare Annual Wellness (AWV)  06/10/2025   Cervical Cancer Screening (HPV/Pap [facility])  04/04/2027   Hepatitis C Screening  Completed   Meningococcal B Vaccine  Aged Out   DTaP/Tdap/Td  Discontinued   COVID-19 Vaccine  Discontinued    Health Maintenance  Health  Maintenance Due  Topic Date Due   HIV Screening  Never done   Pneumococcal Vaccine 45-60 Years old (1 of 2 - PCV) Never done   Hepatitis B Vaccines (1 of 3 - 19+ 3-dose series) Never done   HPV VACCINES (1 - 3-dose SCDM series) Never done    Mammogram status: Completed  . Repeat every year    Lung Cancer Screening: (Low Dose CT Chest recommended if Age 9-80 years, 20 pack-year currently smoking OR have quit w/in 15years.) does not qualify.   Lung Cancer Screening Referral:   Additional Screening:  Hepatitis C Screening: does not qualify; Completed 2022  Vision Screening: Recommended annual ophthalmology exams for early detection of glaucoma and other disorders of the eye. Is the patient up to date with their annual eye exam?  Yes  Who is the provider or what is the name of  the office in which the patient attends annual eye exams? [facility] If pt is not established with a provider, would they like to be referred to a provider to establish care? No .   Dental Screening: Recommended annual dental exams for proper oral hygiene   Community Resource Referral / Chronic Care Management: CRR required this visit?  No   CCM required this visit?  No     Plan:     I have personally reviewed and noted the following in the patient's chart:   Medical and social history Use of alcohol, tobacco or illicit drugs  Current medications and supplements including opioid prescriptions. Patient is not currently taking opioid prescriptions. Functional ability and status Nutritional status Physical activity Advanced directives List of other physicians Hospitalizations, surgeries, and ER visits in previous 12 months Vitals Screenings to include cognitive, depression, and falls Referrals and appointments  In addition, I have reviewed and discussed with patient certain preventive protocols, quality metrics, and best practice recommendations. A written personalized care plan for preventive services as well as general preventive health recommendations were provided to patient.     Mliss Graff, LPN   [id number]   After Visit Summary: (MyChart) Due to this being a telephonic visit, the after visit summary with patients personalized plan was offered to patient via MyChart   Nurse Notes:

## 2024-06-30 ENCOUNTER — Ambulatory Visit: Admitting: Orthopedic Surgery

## 2024-06-30 ENCOUNTER — Other Ambulatory Visit (INDEPENDENT_AMBULATORY_CARE_PROVIDER_SITE_OTHER): Payer: Self-pay

## 2024-06-30 DIAGNOSIS — M25511 Pain in right shoulder: Secondary | ICD-10-CM

## 2024-07-01 ENCOUNTER — Encounter: Payer: Self-pay | Admitting: Orthopedic Surgery

## 2024-07-01 NOTE — Progress Notes (Signed)
 Office Visit Note   [name]           Date of Birth: 08-Jan-1981           MRN: [id number] Visit Date: 06/30/2024 Requested by: Duanne Butler DASEN, MD [address] [place],  [place] [address] PCP: Duanne Butler DASEN, MD  Subjective: Chief Complaint  Patient presents with   Right Shoulder - Pain    HPI: Theresa Washington is a 43 y.o. female who presents to the office reporting right shoulder pain of 1 year duration.  Denies a history of injury.  No prior surgery on the right shoulder.  Has history of left shoulder adhesive capsulitis treated with surgery in 2022.  Patient states on the right-hand side the pain comes and goes and is worse with lifting and reaching.  Patient states she feels a pulling sensation.  Does wake her from sleep at night with pain.  Denies any radicular component.  Has a little numbness and tingling with prolonged driving.  Denies any neck pain.  Ibuprofen only gives her slight relief.  Pain is on the superior aspect of the shoulder as well as laterally.  She does nonphysical work.  Overhead motion is worse.  She does have a known diagnosis of rheumatoid arthritis.  She is not taking anymore methotrexate..                ROS: All systems reviewed are negative as they relate to the chief complaint within the history of present illness.  Patient denies fevers or chills.  Assessment & Plan: Visit Diagnoses:  1. Right shoulder pain, unspecified chronicity     Plan: Impression is right shoulder pain with possible early adhesive capsulitis.  Not too much in terms of significant loss of range of motion.  She could have other structural problems in the shoulder that are not apparent on exam.  Plan at this time is with 1 year of symptoms MRI arthrogram of the shoulder is indicated.  Could also be a manifestation of rheumatoid arthritis and synovitis.  We could consider injection after ruling out structural pathology in the shoulder.  Follow-up after  that study.  Follow-Up Instructions: No follow-ups on file.   Orders:  Orders Placed This Encounter  Procedures   XR Shoulder Right   MR SHOULDER RIGHT W CONTRAST   Arthrogram   No orders of the defined types were placed in this encounter.     Procedures: No procedures performed   Clinical Data: No additional findings.  Objective: Vital Signs: There were no vitals taken for this visit.  Physical Exam:  Constitutional: Patient appears well-developed HEENT:  Head: Normocephalic Eyes:EOM are normal Neck: Normal range of motion Cardiovascular: Normal rate Pulmonary/chest: Effort normal Neurologic: Patient is alert Skin: Skin is warm Psychiatric: Patient has normal mood and affect  Ortho Exam: Ortho exam demonstrates range of motion of the right of 60/100/170 range of motion of the left 60/100/170.  Patient has very good rotator cuff strength infraspinatus supraspinatus and subscap muscle testing.  Equivocal O'Brien's testing on the right.  No discrete AC joint tenderness on the right left-hand side.  No discrete bicipital groove tenderness on the right versus left.  No coarseness or grinding with internal/external rotation of the shoulder 90 degrees of abduction  Patient has bilateral 5 out of 5 grip EPL FPL interosseous wrist flexion wrist extension bicep triceps and deltoid strength.  Bilateral palpable radial pulses and no paresthesias C5-T1 in either  arm.  Neck range of motion flexion chin to chest with extension approximately 50 degrees with approximately 50 degrees of rotation bilaterally.  No masses lymphadenopathy or skin changes around the neck or shoulder girdle region bilaterally   Specialty Comments:  No specialty comments available.  Imaging: No results found.   PMFS History: Patient Active Problem List   Diagnosis Date Noted   Livedo reticularis 01/08/2022   Trochanteric bursitis, left hip 08/31/2021   Bilateral knee pain 08/01/2021   Rheumatoid  arthritis with rheumatoid factor of multiple sites without organ or systems involvement (HCC) 07/10/2021   High risk medication use 07/10/2021   Essential hypertension 11/24/2020   Nonrheumatic mitral valve regurgitation 11/24/2020   Smoker 11/30/2015   Palpitations 04/27/2014   Spondylosis 02/24/2014   Fibromyalgia 02/10/2013   Somatization disorder 02/10/2013   Psychogenic nonepileptic seizure 02/10/2013   DDD (degenerative disc disease), lumbosacral 02/10/2013   History of idiopathic seizure 10/14/2011   Von Willebrand's disease (HCC) 10/14/2011   COPD (chronic obstructive pulmonary disease) (HCC) 05/20/2010   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HCC)    Myositis    Ocular migraine    Palpitations    yrs ago - only one episode r/t stress   Palpitations 04/27/2014   PVC (premature ventricular contraction)    yrs ago - only one episode   Scoliosis    Seizures (HCC)    unknown etiolgy; no meds, last seizure was in 2014   Smoker 11/30/2015   quit 2015   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HCC)    Spondylosis     Family History  Problem Relation Age of Onset   Multiple sclerosis Mother    Heart disease Father    Kidney disease Father    Alcohol abuse Brother    Mental retardation Brother    Schizophrenia Brother    Other Brother        back problems   ALS Brother    Colon cancer Maternal Grandmother    Healthy Son    Healthy Son     Past Surgical History:  Procedure Laterality Date   DILATION AND CURETTAGE OF UTERUS  11/18/1996   DILITATION & CURRETTAGE/HYSTROSCOPY WITH NOVASURE ABLATION N/A 01/16/2016   Procedure: HYSTEROSCOPY WITH NOVASURE ABLATION;  Surgeon: Norleen Edsel GAILS, MD;  Location: [facility];   Service: Gynecology;  Laterality: N/A;  Uterine Cavity Length 4.0 cm Uterine Cavity Width 3.3cm Power 73  Time 1 minute 17seconds   HIP SURGERY Left 2021   LAPAROSCOPIC BILATERAL SALPINGECTOMY Bilateral 01/16/2016   Procedure: LAPAROSCOPIC BILATERAL SALPINGECTOMY;  Surgeon: Norleen Edsel GAILS, MD;  Location: [facility];  Service: Gynecology;  Laterality: Bilateral;  procedure 1   OOPHORECTOMY  11/18/2004   Left side   SHOULDER ARTHROSCOPY Left 06/04/2021   Left shoulder arthroscopy with biceps tenodesis mini open rotator cuff tear repair   SHOULDER ARTHROSCOPY Left 09/04/2021   Procedure: LEFT SHOULDER MANIPULATION UNDER ANESTHESIA, ARTHROSCOPY WITH ROTATOR INTERVAL RELEASE;  Surgeon: Addie Cordella Hamilton, MD;  Location: [facility] OR;  Service: Orthopedics;  Laterality: Left;   Social History   Occupational History   Not on file  Tobacco Use   Smoking status: Former    Current packs/day: 0.00    Average packs/day: 1 pack/day for 20.0 years (20.0 ttl pk-yrs)    Types: Cigarettes    Start date: 09/12/1996    Quit date: 09/12/2016    Years since quitting: 7.8    Passive exposure: Past   Smokeless tobacco: Never  Vaping Use   Vaping status: Every Day   Substances: Nicotine, CBD, Flavoring   Devices: [employer]  Substance and Sexual Activity   Alcohol use: Yes    Comment: socially   Drug use: Never   Sexual activity: Yes    Birth control/protection: Surgical    Comment: tubal ligation and ablation

## 2024-07-16 ENCOUNTER — Ambulatory Visit
Admission: RE | Admit: 2024-07-16 | Discharge: 2024-07-16 | Disposition: A | Discharge status: Home / Self Care | Source: Ambulatory Visit | Attending: Orthopedic Surgery | Admitting: Orthopedic Surgery

## 2024-07-16 ENCOUNTER — Encounter: Payer: Self-pay | Admitting: Orthopedic Surgery

## 2024-07-16 DIAGNOSIS — M25511 Pain in right shoulder: Secondary | ICD-10-CM

## 2024-07-23 ENCOUNTER — Ambulatory Visit: Admitting: Orthopedic Surgery

## 2024-07-28 ENCOUNTER — Ambulatory Visit: Admitting: Orthopedic Surgery

## 2024-07-28 ENCOUNTER — Other Ambulatory Visit: Payer: Self-pay

## 2024-07-28 ENCOUNTER — Encounter: Payer: Self-pay | Admitting: Orthopedic Surgery

## 2024-07-28 DIAGNOSIS — M65911 Unspecified synovitis and tenosynovitis, right shoulder: Secondary | ICD-10-CM

## 2024-07-28 DIAGNOSIS — M25511 Pain in right shoulder: Secondary | ICD-10-CM

## 2024-07-28 MED ORDER — BUPIVACAINE HCL 0.5 % IJ SOLN
9.0000 mL | INTRAMUSCULAR | Status: AC | PRN
Start: 2024-07-28 — End: 2024-07-28
  Administered 2024-07-28: 9 mL via INTRA_ARTICULAR

## 2024-07-28 MED ORDER — LIDOCAINE HCL 1 % IJ SOLN
5.0000 mL | INTRAMUSCULAR | Status: AC | PRN
  Administered 2024-07-28: 5 mL

## 2024-07-28 MED ORDER — METHYLPREDNISOLONE ACETATE 40 MG/ML IJ SUSP
40.0000 mg | INTRAMUSCULAR | Status: AC | PRN
  Administered 2024-07-28: 40 mg via INTRA_ARTICULAR

## 2024-07-28 NOTE — Progress Notes (Signed)
 Office Visit Note   [name]           Date of Birth: 10-13-1981           MRN: [id number] Visit Date: 07/28/2024 Requested by: Duanne Butler DASEN, MD [address] [place],  [place] [address] PCP: Duanne Butler DASEN, MD  Subjective: Chief Complaint  Patient presents with   Right Shoulder - Pain    HPI: Theresa Washington is a 43 y.o. female who presents to the office reporting right shoulder follow-up.  MRI was canceled and it has been rescheduled for early October.  States that she hurts her to lift that arm behind to reach behind for anything.  Also hurts for her to lift groceries.  She also likes to lift her 13 pound grandson which is painful.  Bothers her with most ADLs.  Does have a known history of autoimmune disease but currently is not taking any Biologics and is managing with diet modification instead..                ROS: All systems reviewed are negative as they relate to the chief complaint within the history of present illness.  Patient denies fevers or chills.  Assessment & Plan: Visit Diagnoses:  1. Right shoulder pain, unspecified chronicity     Plan: Impression is right shoulder pain with pretty well-maintained motion.  She does have rheumatoid arthritis which could be contributing to this.  Glenohumeral joint injection performed today under ultrasound guidance.  Follow-up after MRI scan.  Follow-Up Instructions: No follow-ups on file.   Orders:  Orders Placed This Encounter  Procedures   US  Guided Needle Placement - No Linked Charges   No orders of the defined types were placed in this encounter.     Procedures: Large Joint Inj: R glenohumeral on 07/28/2024 6:47 PM Indications: diagnostic evaluation and pain Details: 22 G 3.5 in needle, ultrasound-guided posterior approach  Arthrogram: No  Medications: 9 mL bupivacaine  0.5 %; 40 mg methylPREDNISolone  acetate 40 MG/ML; 5 mL lidocaine  1 % Outcome: tolerated well, no immediate  complications Procedure, treatment alternatives, risks and benefits explained, specific risks discussed. Consent was given by the patient. Immediately prior to procedure a time out was called to verify the correct patient, procedure, equipment, support staff and site/side marked as required. Patient was prepped and draped in the usual sterile fashion.       Clinical Data: No additional findings.  Objective: Vital Signs: There were no vitals taken for this visit.  Physical Exam:  Constitutional: Patient appears well-developed HEENT:  Head: Normocephalic Eyes:EOM are normal Neck: Normal range of motion Cardiovascular: Normal rate Pulmonary/chest: Effort normal Neurologic: Patient is alert Skin: Skin is warm Psychiatric: Patient has normal mood and affect  Ortho Exam: Ortho exam demonstrates bilateral shoulder range of motion is 60/100/165.  Rotator cuff strength is intact bilaterally to infraspinatus supraspinatus and subscap muscle testing.  No masses lymphadenopathy or skin changes noted in the shoulder girdle region.  No warmth to the shoulder girdle right versus left no AC joint tenderness.  Specialty Comments:  No specialty comments available.  Imaging: US  Guided Needle Placement - No Linked Charges Result Date: 07/28/2024 Ultrasound imaging demonstrates needle placement into the glenohumeral joint with injection of fluid into the joint and no complicating features.     PMFS History: Patient Active Problem List   Diagnosis Date Noted   Livedo reticularis 01/08/2022   Trochanteric bursitis, left hip 08/31/2021   Bilateral  knee pain 08/01/2021   Rheumatoid arthritis with rheumatoid factor of multiple sites without organ or systems involvement (HCC) 07/10/2021   High risk medication use 07/10/2021   Essential hypertension 11/24/2020   Nonrheumatic mitral valve regurgitation 11/24/2020   Smoker 11/30/2015   Palpitations 04/27/2014   Spondylosis 02/24/2014    Fibromyalgia 02/10/2013   Somatization disorder 02/10/2013   Psychogenic nonepileptic seizure 02/10/2013   DDD (degenerative disc disease), lumbosacral 02/10/2013   History of idiopathic seizure 10/14/2011   Von Willebrand's disease (HCC) 10/14/2011   COPD (chronic obstructive pulmonary disease) (HCC) 05/20/2010   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HCC)    Myositis    Ocular migraine    Palpitations    yrs ago - only one episode r/t stress   Palpitations 04/27/2014   PVC (premature ventricular contraction)    yrs ago - only one episode   Scoliosis    Seizures (HCC)    unknown etiolgy; no meds, last seizure was in 2014   Smoker 11/30/2015   quit 2015   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HCC)    Spondylosis     Family History  Problem Relation Age of Onset   Multiple sclerosis Mother    Heart disease Father    Kidney disease Father    Alcohol abuse Brother    Mental retardation Brother    Schizophrenia Brother    Other Brother        back problems   ALS Brother    Colon cancer Maternal Grandmother    Healthy Son    Healthy Son     Past Surgical History:  Procedure Laterality Date   DILATION AND CURETTAGE OF UTERUS  11/18/1996   DILITATION & CURRETTAGE/HYSTROSCOPY WITH NOVASURE ABLATION N/A 01/16/2016   Procedure: HYSTEROSCOPY WITH NOVASURE ABLATION;  Surgeon: Norleen Edsel GAILS, MD;  Location: [facility];  Service: Gynecology;  Laterality: N/A;  Uterine Cavity Length 4.0 cm Uterine Cavity Width 3.3cm Power 73  Time 1 minute 17seconds   HIP SURGERY Left 2021   LAPAROSCOPIC BILATERAL SALPINGECTOMY Bilateral 01/16/2016   Procedure: LAPAROSCOPIC BILATERAL SALPINGECTOMY;  Surgeon: Norleen Edsel GAILS, MD;  Location: [facility];  Service: Gynecology;  Laterality: Bilateral;  procedure 1   OOPHORECTOMY  11/18/2004   Left side   SHOULDER ARTHROSCOPY Left 06/04/2021   Left shoulder arthroscopy with biceps tenodesis mini open rotator cuff tear repair   SHOULDER ARTHROSCOPY Left 09/04/2021   Procedure: LEFT SHOULDER MANIPULATION UNDER ANESTHESIA, ARTHROSCOPY WITH ROTATOR INTERVAL RELEASE;  Surgeon: Addie Cordella Hamilton, MD;  Location: [facility] OR;  Service: Orthopedics;  Laterality: Left;   Social History   Occupational History   Not on file  Tobacco Use   Smoking status: Former    Current packs/day: 0.00    Average packs/day: 1 pack/day for 20.0 years (20.0 ttl pk-yrs)    Types: Cigarettes    Start date: 09/12/1996    Quit date: 09/12/2016    Years since quitting: 7.8    Passive exposure: Past   Smokeless tobacco: Never  Vaping Use   Vaping status: Every Day   Substances: Nicotine, CBD, Flavoring  Devices: Channie Adolphus  Substance and Sexual Activity   Alcohol use: Yes    Comment: socially   Drug use: Never   Sexual activity: Yes    Birth control/protection: Surgical    Comment: tubal ligation and ablation

## 2024-08-20 ENCOUNTER — Ambulatory Visit
Admission: RE | Admit: 2024-08-20 | Discharge: 2024-08-20 | Disposition: A | Discharge status: Home / Self Care | Source: Ambulatory Visit | Attending: Orthopedic Surgery | Admitting: Orthopedic Surgery

## 2024-08-20 DIAGNOSIS — G8929 Other chronic pain: Secondary | ICD-10-CM | POA: Diagnosis not present

## 2024-08-20 DIAGNOSIS — M25511 Pain in right shoulder: Secondary | ICD-10-CM | POA: Diagnosis not present

## 2024-08-20 MED ORDER — IOPAMIDOL (ISOVUE-M 200) INJECTION 41%
10.0000 mL | Freq: Once | INTRAMUSCULAR | Status: AC
  Administered 2024-08-20: 10 mL via INTRA_ARTICULAR

## 2024-08-24 ENCOUNTER — Ambulatory Visit: Payer: Self-pay | Admitting: Orthopedic Surgery

## 2024-08-24 NOTE — Progress Notes (Signed)
 I called lmom mri ok  no surgery indicated

## 2024-09-20 ENCOUNTER — Encounter: Payer: Self-pay | Admitting: Radiology

## 2024-10-01 ENCOUNTER — Encounter: Payer: Self-pay | Admitting: Orthopedic Surgery

## 2024-10-21 ENCOUNTER — Ambulatory Visit: Admitting: Family Medicine

## 2024-10-21 ENCOUNTER — Encounter: Payer: Self-pay | Admitting: Family Medicine

## 2024-10-21 VITALS — BP 120/88 | HR 57 | Temp 98.5°F | Ht 62.0 in | Wt 138.4 lb

## 2024-10-21 DIAGNOSIS — I10 Essential (primary) hypertension: Secondary | ICD-10-CM

## 2024-10-21 DIAGNOSIS — Z13220 Encounter for screening for lipoid disorders: Secondary | ICD-10-CM

## 2024-10-21 NOTE — Progress Notes (Signed)
 Subjective:    Patient ID: Theresa Washington, female    DOB: July 20, 1981, 43 y.o.   MRN: [id number]  Patient is here today just for a checkup.  She has a history of hypertension.  She is currently on Bystolic .  Blood pressure is well-controlled at 120/88.  She denies any chest pain or shortness of breath or dyspnea on exertion.  Her mammogram is up-to-date.  She gets her Pap smear through her gynecologist and she has had 1 within the last 3 years.  She is not yet due for a colonoscopy.  She politely declines a flu shot.  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HCC)    Myositis    Ocular migraine    Palpitations    yrs ago - only one episode r/t stress   Palpitations 04/27/2014   PVC (premature ventricular contraction)    yrs ago - only one episode   Scoliosis    Seizures (HCC)    unknown etiolgy; no meds, last seizure was in 2014   Smoker 11/30/2015   quit 2015   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HCC)    Spondylosis    Past Surgical History:  Procedure Laterality Date   DILATION AND CURETTAGE OF UTERUS  11/18/1996   DILITATION & CURRETTAGE/HYSTROSCOPY WITH NOVASURE ABLATION N/A 01/16/2016   Procedure: HYSTEROSCOPY WITH NOVASURE ABLATION;  Surgeon: Norleen Edsel GAILS, MD;  Location: [facility];  Service: Gynecology;  Laterality: N/A;  Uterine Cavity Length 4.0 cm Uterine Cavity Width 3.3cm Power 73  Time 1 minute 17seconds   HIP SURGERY Left 2021   LAPAROSCOPIC BILATERAL SALPINGECTOMY Bilateral 01/16/2016   Procedure: LAPAROSCOPIC BILATERAL SALPINGECTOMY;  Surgeon: Norleen Edsel GAILS, MD;  Location: [facility];  Service: Gynecology;  Laterality: Bilateral;  procedure 1   OOPHORECTOMY  11/18/2004   Left side   SHOULDER ARTHROSCOPY  Left 06/04/2021   Left shoulder arthroscopy with biceps tenodesis mini open rotator cuff tear repair   SHOULDER ARTHROSCOPY Left 09/04/2021   Procedure: LEFT SHOULDER MANIPULATION UNDER ANESTHESIA, ARTHROSCOPY WITH ROTATOR INTERVAL RELEASE;  Surgeon: Addie Cordella Hamilton, MD;  Location: [facility] OR;  Service: Orthopedics;  Laterality: Left;   Current Outpatient Medications on File Prior to Visit  Medication Sig Dispense Refill   Biotin 5000 MCG CAPS Take 1 capsule by mouth daily.     Cholecalciferol (VITAMIN D-3) 125 MCG (5000 UT) TABS Take 1 tablet by mouth 1 day or 1 dose.     cyclobenzaprine  (FLEXERIL ) 10 MG tablet Take 1 tablet (10 mg total) by mouth every 8 (eight) hours as needed for muscle spasms. 20 tablet 0   Multiple Vitamin (MULTIVITAMIN WITH MINERALS) TABS tablet Take 1 tablet by mouth daily.     nebivolol  (BYSTOLIC ) 5 MG tablet Take 1 tablet (5 mg total) by mouth daily. 90 tablet 2   No current facility-administered medications on file prior to visit.   Allergies  Allergen Reactions   Shrimp [Shellfish Allergy] Anaphylaxis, Hives and Swelling    Pt states I carry an Epipen . I had anaphylactic reaction to something but I don't know what.     Hydromorphone  Nausea And Vomiting  Other     Adhesive tape caused blisters. Okay to use paper tape.   Social History   Socioeconomic History   Marital status: Married    Spouse name: [name]   Number of children: 2   Years of education: Not on file   Highest education level: Associate degree: occupational, technical, or vocational program  Occupational History   Not on file  Tobacco Use   Smoking status: Former    Current packs/day: 0.00    Average packs/day: 1 pack/day for 20.0 years (20.0 ttl pk-yrs)    Types: Cigarettes    Start date: 09/12/1996    Quit date: 09/12/2016    Years since quitting: 8.1    Passive exposure: Past   Smokeless tobacco: Never  Vaping Use   Vaping status: Every Day   Substances: Nicotine, CBD,  Flavoring   Devices: [employer]  Substance and Sexual Activity   Alcohol use: Yes    Comment: socially   Drug use: Never   Sexual activity: Yes    Birth control/protection: Surgical    Comment: tubal ligation and ablation  Other Topics Concern   Not on file  Social History Narrative   2 sons, Norleen Raddle and Rosalie.   Social Drivers of Corporate Investment Banker Strain: Low Risk  (06/10/2024)   Overall Financial Resource Strain (CARDIA)    Difficulty of Paying Living Expenses: Not hard at all  Food Insecurity: No Food Insecurity (06/10/2024)   Hunger Vital Sign    Worried About Running Out of Food in the Last Year: Never true    Ran Out of Food in the Last Year: Never true  Transportation Needs: No Transportation Needs (06/10/2024)   PRAPARE - Administrator, Civil Service (Medical): No    Lack of Transportation (Non-Medical): No  Physical Activity: Sufficiently Active (06/10/2024)   Exercise Vital Sign    Days of Exercise per Week: 4 days    Minutes of Exercise per Session: 50 min  Stress: No Stress Concern Present (06/10/2024)   [employer] of Occupational Health - Occupational Stress Questionnaire    Feeling of Stress: Only a little  Social Connections: Socially Integrated (06/10/2024)   Social Connection and Isolation Panel    Frequency of Communication with Friends and Family: More than three times a week    Frequency of Social Gatherings with Friends and Family: More than three times a week    Attends Religious Services: More than 4 times per year    Active Member of [employer] or Organizations: Yes    Attends Banker Meetings: More than 4 times per year    Marital Status: Married  Catering Manager Violence: Not At Risk (06/10/2024)   Humiliation, Afraid, Rape, and Kick questionnaire    Fear of Current or Ex-Partner: No    Emotionally Abused: No    Physically Abused: No    Sexually Abused: No       Review of Systems  All other systems  reviewed and are negative.      Objective:   Physical Exam Vitals reviewed.  Constitutional:      General: She is not in acute distress.    Appearance: She is well-developed. She is not diaphoretic.  Cardiovascular:     Rate and Rhythm: Normal rate and regular rhythm.     Heart sounds: Murmur heard.     Systolic murmur is present with a grade of 3/6.  Pulmonary:     Effort: Pulmonary  effort is normal. No respiratory distress.     Breath sounds: Normal breath sounds. No wheezing or rales.  Abdominal:     General: Bowel sounds are normal. There is no distension.     Palpations: Abdomen is soft.     Tenderness: There is no abdominal tenderness. There is no guarding or rebound.  Skin:    General: Skin is warm and dry.  Neurological:     General: No focal deficit present.     Mental Status: She is oriented to person, place, and time. Mental status is at baseline.     Cranial Nerves: No cranial nerve deficit.     Sensory: No sensory deficit.     Motor: No weakness.     Coordination: Coordination normal.     Gait: Gait normal.     Deep Tendon Reflexes: Reflexes normal.         Assessment & Plan:  Screening cholesterol level - Plan: CBC with Differential/Platelet, Comprehensive metabolic panel with GFR, Lipid panel  Benign essential HTN Blood pressure today is outstanding.  I will screen her cholesterol level with a CBC CMP and lipid panel.  I like to see her LDL cholesterol less than 869.  Offered the patient a flu shot which she politely declined.  Mammogram and Pap smear are up-to-date

## 2024-10-22 ENCOUNTER — Ambulatory Visit: Payer: Self-pay | Admitting: Family Medicine

## 2024-10-22 LAB — COMPREHENSIVE METABOLIC PANEL WITH GFR
AG Ratio: 1.5 (calc) (ref 1.0–2.5)
ALT: 8 U/L (ref 6–29)
AST: 10 U/L (ref 10–30)
Albumin: 4.3 g/dL (ref 3.6–5.1)
Alkaline phosphatase (APISO): 47 U/L (ref 31–125)
BUN: 13 mg/dL (ref 7–25)
CO2: 27 mmol/L (ref 20–32)
Calcium: 9.1 mg/dL (ref 8.6–10.2)
Chloride: 101 mmol/L (ref 98–110)
Creat: 0.83 mg/dL (ref 0.50–0.99)
Globulin: 2.8 g/dL (ref 1.9–3.7)
Glucose, Bld: 91 mg/dL (ref 65–99)
Potassium: 4.1 mmol/L (ref 3.5–5.3)
Sodium: 136 mmol/L (ref 135–146)
Total Bilirubin: 0.4 mg/dL (ref 0.2–1.2)
Total Protein: 7.1 g/dL (ref 6.1–8.1)
eGFR: 90 mL/min/1.73m2 (ref >=60)

## 2024-10-22 LAB — CBC WITH DIFFERENTIAL/PLATELET
Absolute Lymphocytes: 2420 {cells}/uL (ref 850–3900)
Absolute Monocytes: 803 {cells}/uL (ref 200–950)
Basophils Absolute: 44 {cells}/uL (ref 0–200)
Basophils Relative: 0.4 %
Eosinophils Absolute: 66 {cells}/uL (ref 15–500)
Eosinophils Relative: 0.6 %
HCT: 39.6 % (ref 35.9–46.0)
Hemoglobin: 13.2 g/dL (ref 11.7–15.5)
MCH: 30.8 pg (ref 27.0–33.0)
MCHC: 33.3 g/dL (ref 31.6–35.4)
MCV: 92.5 fL (ref 81.4–101.7)
MPV: 9.7 fL (ref 7.5–12.5)
Monocytes Relative: 7.3 %
Neutro Abs: 7667 {cells}/uL (ref 1500–7800)
Neutrophils Relative %: 69.7 %
Platelets: 354 Thousand/uL (ref 140–400)
RBC: 4.28 Million/uL (ref 3.80–5.10)
RDW: 11.8 % (ref 11.0–15.0)
Total Lymphocyte: 22 %
WBC: 11 Thousand/uL — ABNORMAL HIGH (ref 3.8–10.8)

## 2024-10-22 LAB — LIPID PANEL
Cholesterol: 232 mg/dL — ABNORMAL HIGH (ref <200)
HDL: 97 mg/dL (ref >=50)
LDL Cholesterol (Calc): 119 mg/dL — ABNORMAL HIGH
Non-HDL Cholesterol (Calc): 135 mg/dL — ABNORMAL HIGH (ref <130)
Total CHOL/HDL Ratio: 2.4 (calc) (ref <5.0)
Triglycerides: 67 mg/dL (ref <150)

## 2024-11-02 DIAGNOSIS — Z13220 Encounter for screening for lipoid disorders: Secondary | ICD-10-CM | POA: Insufficient documentation

## 2025-06-16 ENCOUNTER — Encounter
